# Patient Record
Sex: Female | Born: 1935 | Race: White | Hispanic: No | Smoking: Never smoker
Health system: Southern US, Community
[De-identification: ages and names within clinical notes are randomized; demographics above are authoritative.]

## PROBLEM LIST (undated history)

## (undated) DIAGNOSIS — Z950 Presence of cardiac pacemaker: Secondary | ICD-10-CM

## (undated) DIAGNOSIS — I10 Essential (primary) hypertension: Secondary | ICD-10-CM

## (undated) DIAGNOSIS — E079 Disorder of thyroid, unspecified: Secondary | ICD-10-CM

## (undated) DIAGNOSIS — I509 Heart failure, unspecified: Secondary | ICD-10-CM

## (undated) DIAGNOSIS — Z8673 Personal history of transient ischemic attack (TIA), and cerebral infarction without residual deficits: Secondary | ICD-10-CM

## (undated) DIAGNOSIS — K219 Gastro-esophageal reflux disease without esophagitis: Secondary | ICD-10-CM

## (undated) DIAGNOSIS — M810 Age-related osteoporosis without current pathological fracture: Secondary | ICD-10-CM

## (undated) DIAGNOSIS — F329 Major depressive disorder, single episode, unspecified: Secondary | ICD-10-CM

## (undated) DIAGNOSIS — R011 Cardiac murmur, unspecified: Secondary | ICD-10-CM

## (undated) DIAGNOSIS — F32A Depression, unspecified: Secondary | ICD-10-CM

## (undated) DIAGNOSIS — Z95810 Presence of automatic (implantable) cardiac defibrillator: Secondary | ICD-10-CM

## (undated) DIAGNOSIS — M1990 Unspecified osteoarthritis, unspecified site: Secondary | ICD-10-CM

## (undated) DIAGNOSIS — T7840XA Allergy, unspecified, initial encounter: Secondary | ICD-10-CM

## (undated) DIAGNOSIS — H269 Unspecified cataract: Secondary | ICD-10-CM

## (undated) DIAGNOSIS — I499 Cardiac arrhythmia, unspecified: Secondary | ICD-10-CM

## (undated) DIAGNOSIS — I219 Acute myocardial infarction, unspecified: Secondary | ICD-10-CM

## (undated) DIAGNOSIS — D649 Anemia, unspecified: Secondary | ICD-10-CM

## (undated) DIAGNOSIS — E039 Hypothyroidism, unspecified: Secondary | ICD-10-CM

## (undated) HISTORY — PX: INSERT / REPLACE / REMOVE PACEMAKER: SUR710

## (undated) HISTORY — DX: Anemia, unspecified: D64.9

## (undated) HISTORY — PX: APPENDECTOMY: SHX54

## (undated) HISTORY — DX: Major depressive disorder, single episode, unspecified: F32.9

## (undated) HISTORY — PX: ABDOMINAL HYSTERECTOMY: SHX81

## (undated) HISTORY — DX: Unspecified cataract: H26.9

## (undated) HISTORY — PX: OTHER SURGICAL HISTORY: SHX169

## (undated) HISTORY — DX: Allergy, unspecified, initial encounter: T78.40XA

## (undated) HISTORY — DX: Age-related osteoporosis without current pathological fracture: M81.0

## (undated) HISTORY — DX: Gastro-esophageal reflux disease without esophagitis: K21.9

## (undated) HISTORY — DX: Depression, unspecified: F32.A

## (undated) HISTORY — PX: HERNIA REPAIR: SHX51

## (undated) HISTORY — DX: Disorder of thyroid, unspecified: E07.9

## (undated) HISTORY — DX: Cardiac murmur, unspecified: R01.1

## (undated) HISTORY — DX: Unspecified osteoarthritis, unspecified site: M19.90

---

## 1999-03-17 ENCOUNTER — Encounter: Payer: Self-pay | Admitting: Neurosurgery

## 1999-03-17 ENCOUNTER — Ambulatory Visit (HOSPITAL_COMMUNITY): Admission: RE | Admit: 1999-03-17 | Discharge: 1999-03-17 | Payer: Self-pay | Admitting: Neurosurgery

## 2004-04-29 ENCOUNTER — Emergency Department: Payer: Self-pay | Admitting: General Practice

## 2013-07-05 ENCOUNTER — Emergency Department: Payer: Self-pay | Admitting: Emergency Medicine

## 2013-07-05 LAB — CBC
HCT: 36.5 % (ref 35.0–47.0)
HGB: 11.6 g/dL — AB (ref 12.0–16.0)
MCH: 29.6 pg (ref 26.0–34.0)
MCHC: 31.9 g/dL — ABNORMAL LOW (ref 32.0–36.0)
MCV: 93 fL (ref 80–100)
Platelet: 134 10*3/uL — ABNORMAL LOW (ref 150–440)
RBC: 3.93 10*6/uL (ref 3.80–5.20)
RDW: 14.1 % (ref 11.5–14.5)
WBC: 6.2 10*3/uL (ref 3.6–11.0)

## 2013-07-05 LAB — URINALYSIS, COMPLETE
Bacteria: NONE SEEN
Bilirubin,UR: NEGATIVE
GLUCOSE, UR: NEGATIVE mg/dL (ref 0–75)
Ketone: NEGATIVE
Nitrite: NEGATIVE
PH: 6 (ref 4.5–8.0)
Protein: NEGATIVE
RBC,UR: 12 /HPF (ref 0–5)
Specific Gravity: 1.01 (ref 1.003–1.030)
WBC UR: 10 /HPF (ref 0–5)

## 2013-07-05 LAB — BASIC METABOLIC PANEL
Anion Gap: 4 — ABNORMAL LOW (ref 7–16)
BUN: 22 mg/dL — ABNORMAL HIGH (ref 7–18)
CALCIUM: 9.3 mg/dL (ref 8.5–10.1)
CO2: 29 mmol/L (ref 21–32)
Chloride: 104 mmol/L (ref 98–107)
Creatinine: 1.11 mg/dL (ref 0.60–1.30)
EGFR (African American): 54 — ABNORMAL LOW
EGFR (Non-African Amer.): 46 — ABNORMAL LOW
GLUCOSE: 94 mg/dL (ref 65–99)
Osmolality: 277 (ref 275–301)
Potassium: 4.3 mmol/L (ref 3.5–5.1)
SODIUM: 137 mmol/L (ref 136–145)

## 2013-07-05 LAB — PROTIME-INR
INR: 1.8
PROTHROMBIN TIME: 20.7 s — AB (ref 11.5–14.7)

## 2013-09-28 ENCOUNTER — Emergency Department: Payer: Self-pay | Admitting: Emergency Medicine

## 2013-09-29 LAB — URINALYSIS, COMPLETE
BILIRUBIN, UR: NEGATIVE
Glucose,UR: NEGATIVE mg/dL (ref 0–75)
Ketone: NEGATIVE
Nitrite: NEGATIVE
Ph: 6 (ref 4.5–8.0)
Protein: NEGATIVE
RBC,UR: 1 /HPF (ref 0–5)
SPECIFIC GRAVITY: 1.011 (ref 1.003–1.030)
Squamous Epithelial: 1

## 2013-10-31 LAB — COMPREHENSIVE METABOLIC PANEL
Albumin: 3.5 g/dL (ref 3.4–5.0)
Alkaline Phosphatase: 63 U/L
Anion Gap: 8 (ref 7–16)
BUN: 28 mg/dL — ABNORMAL HIGH (ref 7–18)
Bilirubin,Total: 0.5 mg/dL (ref 0.2–1.0)
Calcium, Total: 9.4 mg/dL (ref 8.5–10.1)
Chloride: 103 mmol/L (ref 98–107)
Co2: 29 mmol/L (ref 21–32)
Creatinine: 1.47 mg/dL — ABNORMAL HIGH (ref 0.60–1.30)
EGFR (African American): 38 — ABNORMAL LOW
EGFR (Non-African Amer.): 33 — ABNORMAL LOW
Glucose: 107 mg/dL — ABNORMAL HIGH (ref 65–99)
Osmolality: 285 (ref 275–301)
Potassium: 4.5 mmol/L (ref 3.5–5.1)
SGOT(AST): 24 U/L (ref 15–37)
SGPT (ALT): 22 U/L (ref 12–78)
Sodium: 140 mmol/L (ref 136–145)
Total Protein: 7.7 g/dL (ref 6.4–8.2)

## 2013-11-01 ENCOUNTER — Observation Stay: Payer: Self-pay | Admitting: Internal Medicine

## 2013-11-01 LAB — URINALYSIS, COMPLETE
Bilirubin,UR: NEGATIVE
Blood: NEGATIVE
Glucose,UR: NEGATIVE mg/dL (ref 0–75)
Ketone: NEGATIVE
Leukocyte Esterase: NEGATIVE
Nitrite: NEGATIVE
Ph: 5 (ref 4.5–8.0)
Protein: NEGATIVE
RBC,UR: 1 /HPF (ref 0–5)
Specific Gravity: 1.012 (ref 1.003–1.030)
Squamous Epithelial: <1
WBC UR: 14 /HPF (ref 0–5)

## 2013-11-01 LAB — CBC WITH DIFFERENTIAL/PLATELET
Basophil #: 0.1 10*3/uL (ref 0.0–0.1)
Basophil %: 1.8 %
Eosinophil #: 0.2 10*3/uL (ref 0.0–0.7)
Eosinophil %: 3 %
HCT: 36.2 % (ref 35.0–47.0)
HGB: 12 g/dL (ref 12.0–16.0)
Lymphocyte #: 1 10*3/uL (ref 1.0–3.6)
Lymphocyte %: 16.6 %
MCH: 32.3 pg (ref 26.0–34.0)
MCHC: 33.1 g/dL (ref 32.0–36.0)
MCV: 98 fL (ref 80–100)
Monocyte #: 0.7 x10 3/mm (ref 0.2–0.9)
Monocyte %: 12.4 %
Neutrophil #: 3.9 10*3/uL (ref 1.4–6.5)
Neutrophil %: 66.2 %
Platelet: 151 10*3/uL (ref 150–440)
RBC: 3.72 10*6/uL — ABNORMAL LOW (ref 3.80–5.20)
RDW: 14 % (ref 11.5–14.5)
WBC: 5.9 10*3/uL (ref 3.6–11.0)

## 2013-11-01 LAB — PROTIME-INR
INR: 1.4
Prothrombin Time: 16.9 secs — ABNORMAL HIGH (ref 11.5–14.7)

## 2013-11-02 LAB — CBC WITH DIFFERENTIAL/PLATELET
Basophil #: 0.1 10*3/uL (ref 0.0–0.1)
Basophil %: 1.6 %
Eosinophil #: 0.2 10*3/uL (ref 0.0–0.7)
Eosinophil %: 3.6 %
HCT: 34.6 % — ABNORMAL LOW (ref 35.0–47.0)
HGB: 11.3 g/dL — ABNORMAL LOW (ref 12.0–16.0)
Lymphocyte #: 1 10*3/uL (ref 1.0–3.6)
Lymphocyte %: 20.9 %
MCH: 31.9 pg (ref 26.0–34.0)
MCHC: 32.6 g/dL (ref 32.0–36.0)
MCV: 98 fL (ref 80–100)
Monocyte #: 0.5 x10 3/mm (ref 0.2–0.9)
Monocyte %: 11 %
Neutrophil #: 3.1 10*3/uL (ref 1.4–6.5)
Neutrophil %: 62.9 %
Platelet: 128 10*3/uL — ABNORMAL LOW (ref 150–440)
RBC: 3.54 10*6/uL — ABNORMAL LOW (ref 3.80–5.20)
RDW: 13.6 % (ref 11.5–14.5)
WBC: 5 10*3/uL (ref 3.6–11.0)

## 2013-11-02 LAB — BASIC METABOLIC PANEL
Anion Gap: 6 — ABNORMAL LOW (ref 7–16)
BUN: 23 mg/dL — ABNORMAL HIGH (ref 7–18)
Calcium, Total: 8.9 mg/dL (ref 8.5–10.1)
Chloride: 108 mmol/L — ABNORMAL HIGH (ref 98–107)
Co2: 27 mmol/L (ref 21–32)
Creatinine: 1.27 mg/dL (ref 0.60–1.30)
EGFR (African American): 45 — ABNORMAL LOW
EGFR (Non-African Amer.): 39 — ABNORMAL LOW
Glucose: 92 mg/dL (ref 65–99)
Osmolality: 285 (ref 275–301)
Potassium: 4.6 mmol/L (ref 3.5–5.1)
Sodium: 141 mmol/L (ref 136–145)

## 2013-11-02 LAB — MAGNESIUM: Magnesium: 1.7 mg/dL — ABNORMAL LOW

## 2013-11-02 LAB — PROTIME-INR
INR: 1.4
Prothrombin Time: 17.2 secs — ABNORMAL HIGH (ref 11.5–14.7)

## 2013-11-02 LAB — LIPID PANEL
Cholesterol: 136 mg/dL (ref 0–200)
HDL Cholesterol: 34 mg/dL — ABNORMAL LOW (ref 40–60)
Ldl Cholesterol, Calc: 85 mg/dL (ref 0–100)
Triglycerides: 85 mg/dL (ref 0–200)
VLDL Cholesterol, Calc: 17 mg/dL (ref 5–40)

## 2013-11-06 DIAGNOSIS — I6782 Cerebral ischemia: Secondary | ICD-10-CM | POA: Insufficient documentation

## 2013-11-06 DIAGNOSIS — R0789 Other chest pain: Secondary | ICD-10-CM | POA: Insufficient documentation

## 2013-11-06 DIAGNOSIS — G939 Disorder of brain, unspecified: Secondary | ICD-10-CM | POA: Insufficient documentation

## 2013-11-06 DIAGNOSIS — R0781 Pleurodynia: Secondary | ICD-10-CM | POA: Insufficient documentation

## 2013-12-03 ENCOUNTER — Encounter: Payer: Self-pay | Admitting: Family Medicine

## 2014-01-17 DIAGNOSIS — G8929 Other chronic pain: Secondary | ICD-10-CM | POA: Insufficient documentation

## 2014-01-17 DIAGNOSIS — K760 Fatty (change of) liver, not elsewhere classified: Secondary | ICD-10-CM | POA: Insufficient documentation

## 2014-01-17 DIAGNOSIS — K5900 Constipation, unspecified: Secondary | ICD-10-CM | POA: Insufficient documentation

## 2014-01-17 DIAGNOSIS — R109 Unspecified abdominal pain: Secondary | ICD-10-CM

## 2014-01-23 ENCOUNTER — Ambulatory Visit: Payer: Self-pay | Admitting: Unknown Physician Specialty

## 2014-01-30 ENCOUNTER — Ambulatory Visit: Payer: Self-pay | Admitting: Urology

## 2014-04-23 ENCOUNTER — Ambulatory Visit: Payer: Self-pay | Admitting: Vascular Surgery

## 2014-04-23 LAB — BASIC METABOLIC PANEL
Anion Gap: 5 — ABNORMAL LOW (ref 7–16)
BUN: 22 mg/dL — AB (ref 7–18)
CHLORIDE: 109 mmol/L — AB (ref 98–107)
CO2: 26 mmol/L (ref 21–32)
Calcium, Total: 8.5 mg/dL (ref 8.5–10.1)
Creatinine: 1.02 mg/dL (ref 0.60–1.30)
GFR CALC NON AF AMER: 55 — AB
Glucose: 95 mg/dL (ref 65–99)
OSMOLALITY: 283 (ref 275–301)
Potassium: 4.8 mmol/L (ref 3.5–5.1)
Sodium: 140 mmol/L (ref 136–145)

## 2014-04-23 LAB — PROTIME-INR
INR: 1.9
Prothrombin Time: 21.7 secs — ABNORMAL HIGH (ref 11.5–14.7)

## 2014-04-25 DIAGNOSIS — I739 Peripheral vascular disease, unspecified: Secondary | ICD-10-CM | POA: Insufficient documentation

## 2014-04-25 DIAGNOSIS — I999 Unspecified disorder of circulatory system: Secondary | ICD-10-CM | POA: Insufficient documentation

## 2014-05-30 ENCOUNTER — Encounter: Payer: Self-pay | Admitting: Surgery

## 2014-06-13 ENCOUNTER — Encounter: Payer: Self-pay | Admitting: Surgery

## 2014-06-21 DIAGNOSIS — R350 Frequency of micturition: Secondary | ICD-10-CM | POA: Insufficient documentation

## 2014-06-21 DIAGNOSIS — Z87448 Personal history of other diseases of urinary system: Secondary | ICD-10-CM | POA: Insufficient documentation

## 2014-06-21 DIAGNOSIS — Z87718 Personal history of other specified (corrected) congenital malformations of genitourinary system: Secondary | ICD-10-CM | POA: Insufficient documentation

## 2014-06-24 DIAGNOSIS — R339 Retention of urine, unspecified: Secondary | ICD-10-CM | POA: Insufficient documentation

## 2014-06-24 DIAGNOSIS — N390 Urinary tract infection, site not specified: Secondary | ICD-10-CM | POA: Insufficient documentation

## 2014-06-25 ENCOUNTER — Encounter
Admit: 2014-06-25 | Disposition: A | Discharge status: Home / Self Care | Payer: Self-pay | Attending: Surgery | Admitting: Surgery

## 2014-07-26 ENCOUNTER — Encounter
Admit: 2014-07-26 | Disposition: A | Discharge status: Home / Self Care | Payer: Self-pay | Attending: Surgery | Admitting: Surgery

## 2014-08-17 NOTE — H&P (Signed)
PATIENT NAME:  Joanna, Roberts MR#:  [id number] DATE OF BIRTH:  [birth year]  DATE OF ADMISSION:  10/31/2013  REFERRING PHYSICIAN:  Dr. Kerman Passey.  PRIMARY CARE PHYSICIAN:  Dr. Netty Starring.   CHIEF COMPLAINT:  Episode of confusion and slurred speech.   HISTORY OF PRESENT ILLNESS:  This is an 81 year old female with known history of A. Fib on anticoagulation with warfarin, tachybrady syndrome, status post AICD placement, hypothyroidism and hypertension, the patient presents with complaint last evening for brief episode lasted around 30 minutes, reports she was trying to call someone where she could not use the phone, and felt she was confused, as well she could not talk as well.  This episode lasted for around an hour, by the time the patient had came to the ED her symptoms had been resolved, she denies any other focal deficits.  No tingling.  No numbness.  No urinary or stool incontinence.  No seizure activity was noticed by the family as well, as well the patient reports the night before she had another episode of confusion at night where she was found to have high blood pressure which was evaluated by EMS where the patient was back at her baseline by the time they were there as well, the patient had basic work-up in ED which was only significant for mild renal failure with a creatinine of 1.47 where her baseline is usually within normal limits, as well the patient's INR was 1.4, urinalysis is still pending, CT head did not show any acute findings, only showed chronic atrophy and small vessel ischemic changes, hospitalist service requested to admit the patient for further evaluation, she denies any fever, any chills, any dysuria, polyuria, any cough, any productive sputum, any chest pain, reports her AICD was interrogated by Dr. Ubaldo Glassing last month and she reports she did not have any abnormalities.   PAST MEDICAL HISTORY: 1.  Tachybrady syndrome.  2.  Hypothyroidism.  3.  Interstitial disease.  4.  Reflux  esophagitis.  5.  Recurrent UTI.  6.  Anemia.  7.  Diverticulosis with abdominal pain.  8.  Colonic polyps.  9.  Atrial fibrillation.  10.  Cardiomyopathy, status post AICD with pacemaker placement.  11.  Chronic back pain.  12.  Insomnia.   PAST SURGICAL HISTORY:  2.  Herniorrhaphy.  3.  Appendectomy. 4.  Right carotid endarterectomy.  5.  Right vein stripping.   HOME MEDICATIONS: 1.  Sublingual nitroglycerin as needed.  2.  Warfarin 2 mg daily and 3 mg on top of the 2 mg every Monday, Wednesday, Friday.  3.  Coreg 25 mg oral twice daily.  4.  Percocet as needed.  5.  Lisinopril 2.5 mg oral daily.  6.  Levothyroxine 25 mcg oral daily.  8.  Ativan 2 mg as needed.   ALLERGIES:  LORTAB.   SOCIAL HISTORY:  The patient lives with her daughter and son-in-law.  No tobacco.  No alcohol.  No illicit drug use.   FAMILY HISTORY:  Significant for coronary artery disease.   REVIEW OF SYSTEMS:  CONSTITUTIONAL:  Denies fever, chills, fatigue, weakness.  EYES:  Denies blurry vision, double vision, inflammation.  EARS, NOSE, THROAT:  Denies tinnitus, ear pain, hearing loss, epistaxis.  RESPIRATORY:  Denies cough, wheezing, hemoptysis.  CARDIOVASCULAR:  Denies chest pain, arrhythmia, palpitations, syncope.  GASTROINTESTINAL:  Denies nausea, vomiting, diarrhea, abdominal pain, rectal bleed, diarrhea.  Reports constipation.  GENITOURINARY:  Denies dysuria, hematuria, or renal colic.  Reports history of urinary retention where she  self-cath's.  ENDOCRINE:  Denies polyuria, polydipsia, heat or cold intolerance.  HEMATOLOGY:  Denies anemia, easy bruising, or bleeding diathesis.  INTEGUMENT:  Denies acne, rash or skin lesion.  MUSCULOSKELETAL:  Denies any gout, arthritis or cramps.  Reports chronic lower back pain.  NEUROLOGIC:  Denies any history of CVA, TIA, seizures in the past.  Reports an episode of expressive aphasia and confusion.  PSYCHIATRIC:  Denies any history of substance abuse.   Reports history of anxiety.  Denies insomnia or depression.   PHYSICAL EXAMINATION: VITAL SIGNS:  Temperature 98, pulse 73, respiratory rate 20, blood pressure 143/93, saturating 98% on room air.  GENERAL:  Well-nourished female who looks comfortable in bed, in no apparent distress.  HEENT:  Head atraumatic, normocephalic.  Pupils equal, reactive to light.  Pink conjunctivae.  Anicteric sclerae.  Moist oral mucosa.  NECK:  Supple.  No thyromegaly.  No JVD.  CHEST:  Good air entry bilaterally.  No wheezing, rales, rhonchi.  CARDIOVASCULAR:  S1, S2 heard.  No rubs, murmurs, gallops.  ABDOMEN:  Soft, nontender, nondistended.  Bowel sounds present.  EXTREMITIES:  Bilateral lower extremity edema +1 in the left, +1 to 2 on the right which family reports this is chronic.  Pulses felt bilaterally, pedal pulses +2 bilaterally.  PSYCHIATRIC:  Appropriate affect.  Awake, alert x 3.  Intact judgment and insight.  NEUROLOGIC:  Cranial nerves grossly intact.  Motor 5 out of 5.  No focal deficits.  Sensation symmetrical and intact to light touch.  MUSCULOSKELETAL:  No joint effusion or erythema.   PERTINENT LABORATORY DATA:  CT head without contrast showing no acute intracranial abnormalities, chronic atrophy and small vessel ischemic changes.  Glucose 107, BUN 28, creatinine 1.47, sodium 140, potassium 4.5, chloride 103, CO2 29, ALT 22, AST 24, alk phos 63.  White blood cell 5.9, hemoglobin 12, hematocrit 36.2, platelets 151, INR 1.4.   ASSESSMENT AND PLAN: 1.  Acute encephalopathy, brief episodes of altered mental status, there is a suspicion of transient ischemic attack, so the patient will be admitted to telemetry floor for further work-up, unfortunately, cannot obtain MRI/MRA of the brain given her automatic implantable cardiac defibrillator, so we will obtain carotid Dopplers, we will check 2-D echocardiogram.  We will keep her on telemetry and monitor her rhythm, the patient reports her recently  interrogated automatic implantable cardiac defibrillator was within normal limits, we will give her one dose of aspirin.  As well, we will check her urinalysis.  We will continue with gentle hydration for her.  2.  Constipation.  The patient will be started on good bowel regimen including senna, Colace and MiraLAX for a total of three doses. 3.  Acute renal failure, we will hold the patient's lisinopril.  We will continue her with gentle hydration, a total of 500 mL.  4.  Tachybrady syndrome.  The patient is status post pacemaker.  Will be monitored on telemetry.  5.  Hypothyroidism.  Continue with Synthroid.  6.  Atrial fibrillation, rate controlled.  Continue with Coreg and we will consult pharmacy to dose warfarin.  7.  Cardiomyopathy, status post automatic implantable cardiac defibrillator placement.  Does not appear to be in any congestive heart failure or volume overload status.  We will hydrate her gently.  8.  Deep vein thrombosis prophylaxis.  The patient is on warfarin.  9.  CODE STATUS:  REPORTS SHE IS A FULL CODE.  Her daughter is her healthcare power of attorney.   Total time spent on  admission and patient care 55 minutes.     ____________________________ Albertine Patricia, MD dse:ea D: 11/01/2013 01:48:23 ET T: 11/01/2013 02:52:30 ET JOB#: [id number]  cc: Albertine Patricia, MD, <Dictator> Khala Tarte Graciela Husbands MD ELECTRONICALLY SIGNED 11/02/2013 0:50

## 2014-08-17 NOTE — Discharge Summary (Signed)
PATIENT NAME:  Joanna Roberts, Joanna Roberts MR#:  [id number] DATE OF BIRTH:  04-15-1929  DATE OF ADMISSION:  11/01/2013 DATE OF DISCHARGE:  11/02/2013  DISCHARGE DIAGNOSES: 1.  Transient ischemic attack.  2.  Atrial fibrillation, on warfarin.  3.  Mild left ventricle dysfunction with an EF of 40% to 45%.   DISCHARGE MEDICATIONS: 1.  Nitroglycerin 0.4 mg sublingual q. 5 minutes as needed x3 for chest pain.  2.  Acetaminophen/oxycodone 325/5 one tab p.o. q. 4 hours as needed for severe back pain.  3.  Lisinopril 2.5 mg p.o. daily.  4.  Levothyroxine 25 mcg p.o. daily.  5.  Rapaflo 4 mg p.o. daily.  6.  Ativan 2 mg p.o. at bedtime as needed for anxiety.  7.  Warfarin 3 mg p.o. daily.   CONSULTANTS: None.   PROCEDURES: None.   PERTINENT LABORATORY AND STUDIES: The patient had an echo that did show an EF of 40% to 45%. Carotid studies were less than 50% stenosis bilateral.   On day of discharge, sodium 141, potassium 4.6, creatinine 1.27. LDL 85, HDL 34. White blood cell count 5, hemoglobin 11.3, platelets 120,000. INR 1.4.   BRIEF HOSPITAL COURSE:  1.  TIA. The patient initially came in with difficulty speaking and problems with recalling information concerning for possible TIA. CT of the head was negative upon admission. She was risk stratified and had an echocardiogram and carotid studies as stated above. She had no further symptoms. Evaluated by PT who did not think she needed any therapy. Focus on major cardiovascular risk factors at this time. Go home without any further therapy.  2.  A-fib, on Coumadin. She is currently subtherapeutic. We will increase Coumadin level to 3 mg daily. Will need to reassess the INR early next week at her appointment. She is currently rate controlled and asymptomatic.  3.  LV dysfunction. This is new. EF 40% to 45%. She is asymptomatic. No changes to her current regimen.   DISPOSITION: She is in stable condition and will be discharged to home and follow up with Dr.  Burnadette Pop as scheduled next Tuesday.  ____________________________ Marisue Ivan, MD kl:sb D: 11/02/2013 08:32:44 ET T: 11/02/2013 10:17:00 ET JOB#: [id number]  cc: Marisue Ivan, MD, <Dictator> Marisue Ivan MD ELECTRONICALLY SIGNED 11/18/2013 8:06

## 2014-08-21 NOTE — Op Note (Signed)
PATIENT NAME:  Joanna Roberts, Joanna Roberts MR#:  [id number] DATE OF BIRTH:  1929-03-05  DATE OF PROCEDURE:  04/23/2014  PREOPERATIVE DIAGNOSES:  1. Atherosclerotic occlusive disease, bilateral lower extremities, with ulceration of the right ankle.  2. Coronary artery disease.  3. Cardiac arrhythmia, status post pacemaker placement.  4. Hypertension.   POSTOPERATIVE DIAGNOSES:  1. Atherosclerotic occlusive disease, bilateral lower extremities, with ulceration of the right ankle.  2. Coronary artery disease.  3. Cardiac arrhythmia, status post pacemaker placement.  4. Hypertension.   PROCEDURES PERFORMED: 1. Abdominal aortogram.  2. Right lower extremity distal runoff, third order catheter placement.  3. Crosser atherectomy of the peroneal artery occlusion, right side.  4. Percutaneous transluminal angioplasty of the right peroneal artery to 4 mm using the Lutonix balloon.   SURGEON: Renford Dills, MD  SEDATION: Versed plus fentanyl.   MONITORING:  Continuous ECG, pulse oximetry, and cardiopulmonary monitoring is performed throughout the entire procedure by the interventional radiology nurse.   TOTAL SEDATION TIME: 1 hour 30 minutes.   ACCESS: A 6 French sheath, left common femoral artery.   FLUOROSCOPY TIME: 9.8 minutes.   CONTRAST USED: Isovue 88 mL.   INDICATIONS: Ms. Simm is an 81 year old woman who presents with ulceration and ischemic changes of her right foot. She also has significant atherosclerotic occlusive disease of the left side. The risks and benefits for angiography with the hope for intervention for limb salvage on the right were reviewed. All questions have been answered. The patient agrees to proceed with intervention.   DESCRIPTION OF PROCEDURE: The patient is taken to special procedures,  placed in the supine position. After adequate sedation is achieved, both groins are prepped and draped in sterile fashion. Appropriate timeout is called.   Lidocaine 1% is infiltrated  in the soft tissues. On the left, overlying the femoral impulse, an access to the common femoral artery is obtained with ultrasound guidance. Ultrasound probe was placed in a sterile sleeve. Common femoral artery is identified. It is echolucent and pulsatile, indicating patency. Image is recorded for the permanent record. Under real-time visualization, a micropuncture needle is inserted, microwire followed by micro sheath, J-wire followed by a 5 French sheath and 5 French pigtail catheter.   The pigtail catheter is positioned at the level of T12 and AP projection of the aorta is obtained. Pigtail catheter is repositioned to above the bifurcation and an LAO projection of the pelvis is obtained. Rim catheter is then used to cross the aortic bifurcation with the stiff-angled Glidewire, and the wire is advanced down into the proximal SFA. Rim catheter is exchanged for a pigtail catheter and both LAO and RAO views of the common femoral artery are obtained and then subsequently, the detector is positioned AP and distal runoff is obtained. After review of these images, 4000 units of heparin is given. A stiff-angled Glidewire is reintroduced and the 5 [place] sheath is exchanged for a 6 [place] Raby. The 18 wire is then negotiated down to the tibioperoneal trunk, followed by an angled Usher catheter. The Crosser S6 device is prepped on the field and advanced through the Usher catheter. The S6 is then used to cross the occluded segment of the tibial. The Usher is advanced and subsequently, hand injection demonstrates  intraluminal placement with patency of the peroneal, all the way down to the foot with extensive collaterals to the pedal branches. The  wire is reintroduced and initially, a 4 x 6 Lutonix balloon is advanced across the lesion. It is inflated  to 3-4 atmospheres for a minute and a half. This should provide for dispersal of the drug, and then a 3 x 6 Ultraverse balloon is advanced over the wire, across the  lesion, and inflated to 12 atmospheres for 2 minutes. Follow-up imaging demonstrates an excellent result with wide patency of the peroneal artery in its origin. Further review of the images demonstrate the anterior tibial is occluded approximately 1 cm after its origin and remains occluded throughout its course. There is no reconstitution of the dorsalis pedis, specifically. Also, the posterior tibial is nonvisualized throughout its entirety. The lateral plantar also is poorly visualized.   Imaging of both the popliteal in magnified view as well as of the SFA more proximally are performed. These lesions appear to be less than 30% and therefore, should not be hemodynamically significant and do not require treatment at this time.   The sheath is then pulled into the external iliac on the left. Oblique view is obtained and a StarClose device deployed successfully, and there are no immediate complications.   INTERPRETATION: The abdominal aorta, bilateral common and external iliac arteries are patent. There is diffuse disease, but there are no hemodynamically significant stenoses.   The common femoral artery on the right is patent, as is the profunda femoris. SFA is patent. There is diffuse disease, but as noted above, there are no focal hemodynamically significant lesions identified. This is true of the popliteal. The trifurcation, however, ia different story with occlusion of the anterior tibial and posterior tibial throughout the vast majority of their courses and no distal reconstitution to provide a target. The peroneal is patent in its distal two thirds with extensive collaterals filling the foot; however, in its proximal one third over a distance of approximately 4 cm, there is diffuse disease with several with a significant occlusion.   Following Crosser atherectomy, intraluminal positioning is verified. Following angioplasty, there is now patency with in-line flow from the aorta to the foot via the  peroneal.   SUMMARY: Successful salvage of the  right lower extremity with recanalization of the peroneal to provide proper flow for wound healing.    ____________________________ Renford Dills, MD ggs:mw D: 04/23/2014 16:51:41 ET T: 04/23/2014 19:45:20 ET JOB#: [id number]  cc: Renford Dills, MD, <Dictator> Marisue Ivan, MD Renford Dills MD ELECTRONICALLY SIGNED 05/07/2014 17:32

## 2014-08-26 ENCOUNTER — Ambulatory Visit: Payer: Self-pay | Admitting: Surgery

## 2014-09-02 ENCOUNTER — Encounter: Payer: PPO | Attending: Surgery | Admitting: Surgery

## 2014-09-02 DIAGNOSIS — I70201 Unspecified atherosclerosis of native arteries of extremities, right leg: Secondary | ICD-10-CM | POA: Diagnosis not present

## 2014-09-02 DIAGNOSIS — I83013 Varicose veins of right lower extremity with ulcer of ankle: Secondary | ICD-10-CM | POA: Diagnosis not present

## 2014-09-02 DIAGNOSIS — L97312 Non-pressure chronic ulcer of right ankle with fat layer exposed: Secondary | ICD-10-CM | POA: Insufficient documentation
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
## 2014-09-03 NOTE — Progress Notes (Signed)
Joanna Roberts Joanna Roberts Roberts, Joanna Roberts Joanna Roberts Roberts ([id number]) Visit Report for 09/02/2014 Chief Complaint Document Details Patient Name: Joanna Roberts, Joanna Roberts Roberts. Date of Service: 09/02/2014 2:30 PM Medical Record Number: [id number] Patient Account Number: [id number] Date of Birth/Sex: 1928-09-24 (81 y.o. Female) Treating RN: Renee Harder Primary Care Physician: Marisue Ivan Other Clinician: Referring Physician: Levora Dredge Treating Physician/Extender: Rudene Re in Treatment: 13 Information Obtained from: Patient Chief Complaint right lateral malleolus wound. She says she has seen a vascular surgeon and recently about 2 weeks ago. I have reviewed the reports from Dr. Gilda Crease. was unable to fill a prescription for Santyl because of the cost and they have only been using Desitin and Prisma over the wound. 08/12/2014 -- the patient has not seen [place] for about 14 days now and she says that recently she noticed that her left leg was having a little bit of pain and there was a redness over the lateral malleoli on the left side too. The daughter has her own ailments and has not been able to do dressings and the home health nurses only come twice a week so they have not been doing dressings more often. Electronic Signature(s) Signed: 09/02/2014 5:02:53 PM By: Evlyn Kanner MD, FACS Entered By: Evlyn Kanner on 09/02/2014 14:54:13 Joanna Roberts Joanna Roberts Roberts, Joanna Roberts Joanna Roberts Roberts ([id number]) -------------------------------------------------------------------------------- Debridement Details Patient Name: Joanna Roberts Joanna Roberts Roberts. Date of Service: 09/02/2014 2:30 PM Medical Record Number: [id number] Patient Account Number: [id number] Date of Birth/Sex: Mar 13, 1929 (81 y.o. Female) Treating RN: Renee Harder Primary Care Physician: Marisue Ivan Other Clinician: Referring Physician: Levora Dredge Treating Physician/Extender: Rudene Re in Treatment: 13 Debridement Performed for Wound #1 Right,Lateral Malleolus Assessment: Performed By: Physician  Joanna Roberts Joanna Roberts Roberts., MD Debridement: Open Wound/Selective Debridement Selective Description: Pre-procedure Yes Verification/Time Out Taken: Start Time: 14:46 Pain Control: Lidocaine 4% Topical Solution Level: Non-Viable Tissue Total Area Debrided (L x 0.5 (cm) x 0.3 (cm) = 0.15 (cm) W): Tissue and other Non-Viable, Exudate, Fibrin/Slough material debrided: Instrument: Forceps Bleeding: None End Time: 14:49 Procedural Pain: 0 Post Procedural Pain: 0 Response to Treatment: Procedure was tolerated well Post Debridement Measurements of Total Wound Length: (cm) 0.5 Width: (cm) 0.3 Depth: (cm) 0.2 Volume: (cm) 0.024 Electronic Signature(s) Signed: 09/02/2014 4:40:25 PM By: Renee Harder RN Signed: 09/02/2014 5:02:53 PM By: Evlyn Kanner MD, FACS Entered By: Evlyn Kanner on 09/02/2014 14:53:53 Schlottman, Joanna Roberts Joanna Roberts Roberts ([id number]) -------------------------------------------------------------------------------- HPI Details Patient Name: Joanna Roberts Joanna Roberts Roberts. Date of Service: 09/02/2014 2:30 PM Medical Record Number: [id number] Patient Account Number: [id number] Date of Birth/Sex: 1928/10/07 (81 y.o. Female) Treating RN: Renee Harder Primary Care Physician: Marisue Ivan Other Clinician: Referring Physician: Levora Dredge Treating Physician/Extender: Rudene Re in Treatment: 13 History of Present Illness Location: the patient has a wound on the right lateral malleolus. Duration: she states she has had the wound for a few months now. Context: the patient is in place and a dry dressing on the wound. Modifying Factors: she is already had a vascular evaluation and the vascular surgeon stated that there was enough inflow velocity healed wound. Associated Signs and Symptoms: denies any fevers. HPI Description: the patient is an 81 year old female who was not a diabetic who presents with a right lateral malleolar wound. The patient returns for followup today. She denies fevers. she says  she has had several procedures on her right leg in the past but she does not know if the arterial or venous. She has known that the left leg also had some surgery for some blockages. 06/20/14 -- the reports from the vascular surgeon Dr. Gilda Crease  were that her arterial pressure was enough to heal the wound and he would recommend Jobst ulcer compressions stockings to be worn form morning to night. with the patient has not been compliant with this because she finds it impossible to put them on. The daughter does say that in the coming week she will go down and try and help her with this. 06/27/14 she has been using the idosorb dressings and the daughter has been helping to do it on alternate days. other than that there are no fresh issues. 06/27/14 she's been doing fine and has no present problems. She was seen in the vascular surgeon's office recently and they have not made any changes to her treatment. 07/01/14 -- the patient is here today because she was concerned that the pain in the lateral part of the ankle is significant and that her legs are swollen. However on questioning her she has not been using her compression stockings at all. We had got a juxta light stockings to help her these on easily. however the family members and she have not been able to get this done on a regular basis. 07/08/2014 -- the patient has now begun wearing husband juxta light stockings on a regular basis though she is not wearing the inside stocking. She continues to have mild pain in that area and other than that has no discharge or fever. 07/15/2014 -- they have now been very compliant with the dressing and application of the juxta light stocking on a regular basis. no fresh issues. 07/22/2014 -- she had a fall the other day in the parking lot and hurt both elbows which were just minor abrasions and she has a dressing in place. She did not want [place] to look at those wounds and she says they're minor abrasions. The  dressing was done by the family members and she did not go to the ER nor did she have any x-rays. She says she is getting better from this. Joanna Roberts Joanna Roberts Roberts, Joanna Roberts Joanna Roberts Roberts ([id number]) 07/29/2014 the daughter was concerned regarding some redness around the wound and came a day early. No fever or any other symptoms. Electronic Signature(s) Signed: 09/02/2014 5:02:53 PM By: Evlyn Kanner MD, FACS Entered By: Evlyn Kanner on 09/02/2014 14:54:21 Joanna Roberts Joanna Roberts Roberts, Joanna Roberts Joanna Roberts Roberts ([id number]) -------------------------------------------------------------------------------- Physical Exam Details Patient Name: Joanna Roberts Joanna Roberts Roberts. Date of Service: 09/02/2014 2:30 PM Medical Record Number: [id number] Patient Account Number: [id number] Date of Birth/Sex: 1928-07-14 (81 y.o. Female) Treating RN: Renee Harder Primary Care Physician: Marisue Ivan Other Clinician: Referring Physician: Levora Dredge Treating Physician/Extender: Rudene Re in Treatment: 13 Constitutional . Pulse regular. Respirations normal and unlabored. Afebrile. . Eyes Nonicteric. Reactive to light. Ears, Nose, Mouth, and Throat Lips, teeth, and gums WNL.Marland Kitchen Moist mucosa without lesions . Neck supple and nontender. No palpable supraclavicular or cervical adenopathy. Normal sized without goiter. Respiratory WNL. No retractions.. Cardiovascular Pedal Pulses WNL. No clubbing, cyanosis or edema. Integumentary (Hair, Skin) the ulcerated area on her right lateral ankle area is persistent and needs some debridement of slough.. No crepitus or fluctuance. No peri-wound warmth or erythema. No masses.Marland Kitchen Psychiatric Judgement and insight Intact.. No evidence of depression, anxiety, or agitation.. Electronic Signature(s) Signed: 09/02/2014 5:02:53 PM By: Evlyn Kanner MD, FACS Entered By: Evlyn Kanner on 09/02/2014 14:55:23 Joanna Roberts Joanna Roberts Roberts, Joanna Roberts Joanna Roberts Roberts ([id number]) -------------------------------------------------------------------------------- Physician Orders Details Patient  Name: Joanna Roberts Joanna Roberts Roberts. Date of Service: 09/02/2014 2:30 PM Medical Record Number: [id number] Patient Account Number: [id number] Date of Birth/Sex: [birth year] (81 y.o. Female) Treating RN: Curtis Sites Primary Care Physician: Burnadette Pop,  Trisha Mangle Other Clinician: Referring Physician: Levora Dredge Treating Physician/Extender: Rudene Re in Treatment: 24 Verbal / Phone Orders: Yes Clinician: Curtis Sites Read Back and Verified: Yes Diagnosis Coding Wound Cleansing Wound #1 Right,Lateral Malleolus o Clean wound with Normal Saline. o Cleanse wound with mild soap and water Anesthetic Wound #1 Right,Lateral Malleolus o Topical Lidocaine 4% cream applied to wound bed prior to debridement Skin Barriers/Peri-Wound Care Wound #1 Right,Lateral Malleolus o Barrier cream Primary Wound Dressing Wound #1 Right,Lateral Malleolus o Santyl Ointment Secondary Dressing Wound #1 Right,Lateral Malleolus o Boardered Foam Dressing Dressing Change Frequency Wound #1 Right,Lateral Malleolus o Change dressing every day. Follow-up Appointments Wound #1 Right,Lateral Malleolus o Return Appointment in 1 week. Edema Control Wound #1 Right,Lateral Malleolus o Other: - juxtalite Additional Orders / Instructions Frisby, Soleil S. ([id number]) Wound #1 Right,Lateral Malleolus o Increase protein intake. o Activity as tolerated o Other: - juxtalite ordered Home Health Wound #1 Right,Lateral Malleolus o D/C Home Health Services - per patient request Electronic Signature(s) Signed: 09/02/2014 5:02:53 PM By: Evlyn Kanner MD, FACS Signed: 09/02/2014 5:38:27 PM By: Curtis Sites Entered By: Curtis Sites on 09/02/2014 14:53:10 Rosendahl, Stefan Kathie Joanna Roberts Roberts ([id number]) -------------------------------------------------------------------------------- Problem List Details Patient Name: Joanna Roberts Joanna Roberts Roberts, STAVIG. Date of Service: 09/02/2014 2:30 PM Medical Record Number: [id number] Patient Account Number:  [id number] Date of Birth/Sex: 1928/12/06 (81 y.o. 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 Date of Service: 09/02/2014 2:30 PM Medical Record Number: [id number] Patient Account Number: [id number] Date of Birth/Sex: [birth year] (81 y.o. Female) Treating RN: Renee Harder Primary Care Physician: Marisue Ivan Other Clinician: Referring Physician: Levora Dredge Treating Physician/Extender: Rudene Re in Treatment: 13 Subjective Chief Complaint Information obtained from Patient right lateral malleolus wound. She says she has seen a vascular surgeon and recently about 2 weeks ago. I have reviewed the reports from Dr. Gilda Crease. was unable to fill a prescription for Santyl because of the cost and they have only been using Desitin and Prisma over the wound. 08/12/2014 -- the patient has not seen [place] for about 14 days now and she says that recently she noticed that her left leg was having a little bit of pain and there was a redness over the lateral malleoli on the left side too. The daughter has her own ailments and has not been able to do dressings and the home health  nurses only come twice a week so they have not been doing dressings more often. History of Present Illness (HPI) The following HPI elements were documented for the patient's wound: Location: the patient has a wound on the right lateral malleolus. Duration: she states she has had the wound for a few months now. Context: the patient is in place and a dry dressing on the wound. Modifying Factors: she is already had a vascular evaluation and the vascular surgeon stated that there was enough inflow velocity healed wound. Associated Signs and Symptoms: denies any fevers. the patient is  an 81 year old female who was not a diabetic who presents with a right lateral malleolar wound. The patient returns for followup today. She denies fevers. she says she has had several procedures on her right leg in the past but she does not know if the arterial or venous. She has known that the left leg also had some surgery for some blockages. 06/20/14 -- the reports from the vascular surgeon Dr. Gilda Crease were that her arterial pressure was enough to heal the wound and he would recommend Jobst ulcer compressions stockings to be worn form morning to night. with the patient has not been compliant with this because she finds it impossible to put them on. The daughter does say that in the coming week she will go down and try and help her with this. 06/27/14 she has been using the idosorb dressings and the daughter has been helping to do it on alternate days. other than that there are no fresh issues. 06/27/14 she's been doing fine and has no present problems. She was seen in the vascular surgeon's office recently and they have not made any changes to her treatment. 07/01/14 -- the patient is here today because she was concerned that the pain in the lateral part of the ankle is significant and that her legs are swollen. However on questioning her she has not been using her KASHLEE, NEVILS. ([id number]) compression stockings at  all. We had got a juxta light stockings to help her these on easily. however the family members and she have not been able to get this done on a regular basis. 07/08/2014 -- the patient has now begun wearing husband juxta light stockings on a regular basis though she is not wearing the inside stocking. She continues to have mild pain in that area and other than that has no discharge or fever. 07/15/2014 -- they have now been very compliant with the dressing and application of the juxta light stocking on a regular basis. no fresh issues. 07/22/2014 -- she had a fall the other day in the parking lot and hurt both elbows which were just minor abrasions and she has a dressing in place. She did not want [place] to look at those wounds and she says they're minor abrasions. The dressing was done by the family members and she did not go to the ER nor did she have any x-rays. She says she is getting better from this. 07/29/2014 the daughter was concerned regarding some redness around the wound and came a day early. No fever or any other symptoms. Objective Constitutional Pulse regular. Respirations normal and unlabored. Afebrile. Vitals Time Taken: 2:32 PM, Height: 60 in, Weight: 141.5 lbs, BMI: 27.6, Temperature: 98.3 F, Pulse: 86 bpm, Respiratory Rate: 20 breaths/min, Blood Pressure: 129/81 mmHg. Eyes Nonicteric. Reactive to light. Ears, Nose, Mouth, and Throat Lips, teeth, and gums WNL.Marland Kitchen Moist mucosa without lesions . Neck supple and nontender. No palpable supraclavicular or cervical adenopathy. Normal sized without goiter. Respiratory WNL. No retractions.. Cardiovascular Pedal Pulses WNL. No clubbing, cyanosis or edema. Psychiatric Judgement and insight Intact.. No evidence of depression, anxiety, or agitation.Marland Kitchen Joanna Roberts, Joanna Roberts Roberts. ([id number]) Integumentary (Hair, Skin) the ulcerated area on her right lateral ankle area is persistent and needs some debridement of slough.. No crepitus or  fluctuance. No peri-wound warmth or erythema. No masses.. Wound #1 status is Open. Original cause of wound was Gradually Appeared. The wound is located on the Right,Lateral Malleolus. The wound measures 0.5cm length x 0.3cm width x 0.2cm depth; 0.118cm^2  area and 0.024cm^3 volume. The wound is limited to skin breakdown. There is no tunneling or undermining noted. There is a small amount of serous drainage noted. The wound margin is flat and intact. There is no granulation within the wound bed. There is a large (67-100%) amount of necrotic tissue within the wound bed including Adherent Slough. The periwound skin appearance exhibited: Localized Edema, Maceration, Moist, Hemosiderin Staining, Erythema. The periwound skin appearance did not exhibit: Callus, Crepitus, Excoriation, Fluctuance, Friable, Induration, Rash, Scarring, Dry/Scaly, Atrophie Blanche, Cyanosis, Ecchymosis, Mottled, Pallor, Rubor. The surrounding wound skin color is noted with erythema which is circumferential. Periwound temperature was noted as No Abnormality. The periwound has tenderness on palpation. 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 We will continue with therapy for her wound as described and she does understand that resolution of this problem is going to be extremely slow. We are using Santyl on the wound and she uses compression stockings with juxta lites. She will come and see me next week. Procedures Wound #1 Wound #1 is an Arterial Insufficiency Ulcer located on the Right,Lateral Malleolus . There was a Non-Viable Tissue Open Wound/Selective  [phone]) debridement with total area of 0.15 sq cm performed by Joanna Roberts Joanna Roberts Roberts ([id number]) Charlayne Vultaggio, Ignacia Felling., MD. with the following instrument(s): Forceps to remove Non-Viable tissue/material including Exudate and Fibrin/Slough after achieving pain control using Lidocaine 4% Topical Solution. A time out was conducted prior to the start of the procedure. There was no bleeding. The procedure was tolerated well with a pain level of 0 throughout and a pain level of 0 following the procedure. Post Debridement Measurements: 0.5cm length x 0.3cm width x 0.2cm depth; 0.024cm^3 volume. Plan Wound Cleansing: Wound #1 Right,Lateral Malleolus: Clean wound with Normal Saline. Cleanse wound with mild soap and water Anesthetic: Wound #1 Right,Lateral Malleolus: Topical Lidocaine 4% cream applied to wound bed prior to debridement Skin Barriers/Peri-Wound Care: Wound #1 Right,Lateral Malleolus: Barrier cream Primary Wound Dressing: Wound #1 Right,Lateral Malleolus: Santyl Ointment Secondary Dressing: Wound #1 Right,Lateral Malleolus: Boardered Foam Dressing Dressing Change Frequency: Wound #1 Right,Lateral Malleolus: Change dressing every day. Follow-up Appointments: Wound #1 Right,Lateral Malleolus: Return Appointment in 1 week. Edema Control: Wound #1 Right,Lateral Malleolus: Other: - juxtalite Additional Orders / Instructions: Wound #1 Right,Lateral Malleolus: Increase protein intake. Activity as tolerated Other: - juxtalite ordered Home Health: Wound #1 Right,Lateral Malleolus: D/C Home Health Services - per patient request Joanna Roberts Joanna Roberts Roberts, Joanna Roberts Joanna Roberts Roberts ([id number]) Her vascular surgeon has said that there is no intervention required and we are just to continue with application of juxta light stockings. We will continue with therapy for her wound as described and she does understand that resolution of this problem is going to be extremely slow. We are using Santyl on the wound and she uses  compression stockings with juxta lites. She will come and see me next week. Electronic Signature(s) Signed: 09/02/2014 5:02:53 PM By: Evlyn Kanner MD, FACS Entered By: Evlyn Kanner on 09/02/2014 14:57:22 Joanna Roberts Joanna Roberts Roberts, Joanna Roberts Joanna Roberts Roberts ([id number]) -------------------------------------------------------------------------------- SuperBill Details Patient Name: Joanna Roberts Joanna Roberts Roberts. Date of Service: 09/02/2014 Medical Record Number: [id number] Patient Account Number: [id number] Date of Birth/Sex: 1928-07-17 (81 y.o. 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 I70.201 Unspecified atherosclerosis of native arteries of extremities, right leg Facility Procedures CPT4 Code Description: [id number] - DEBRIDE WOUND 1ST 20 SQ CM OR < ICD-10 Description Diagnosis L97.312 Non-pressure chronic ulcer of right ankle with fat la I83.013 Varicose veins of right lower extremity with ulcer of I70.201 Unspecified  atherosclerosis of native arteries of ext Modifier: yer exposed ankle remities, righ Quantity: 1 t leg Physician Procedures CPT4 Code Description: [id number] - WC PHYS DEBR WO ANESTH 20 SQ CM ICD-10 Description Diagnosis L97.312 Non-pressure chronic ulcer of right ankle with fat lay I83.013 Varicose veins of right lower extremity with ulcer of I70.201 Unspecified  atherosclerosis of native arteries of extr Modifier: er exposed ankle emities, righ Quantity: 1 t leg Electronic Signature(s) Signed: 09/02/2014 5:02:53 PM By: Evlyn Kanner MD, FACS Entered By: Evlyn Kanner on 09/02/2014 14:57:42

## 2014-09-09 ENCOUNTER — Encounter: Payer: PPO | Admitting: Surgery

## 2014-09-09 DIAGNOSIS — L97312 Non-pressure chronic ulcer of right ankle with fat layer exposed: Secondary | ICD-10-CM | POA: Diagnosis not present

## 2014-09-10 NOTE — Progress Notes (Signed)
KIMBREE, CASANAS ([id number]) Visit Report for 09/09/2014 Chief Complaint Document Details Patient Name: Joanna Roberts, Joanna Roberts. Date of Service: 09/09/2014 1:45 PM Medical Record Number: [id number] Patient Account Number: [id number] Date of Birth/Sex: 03/18/1929 (81 y.o. Female) Treating RN: Huel Coventry Primary Care Physician: Marisue Ivan Other Clinician: Referring Physician: Marisue Ivan Treating Physician/Extender: Rudene Re in Treatment: 14 Information Obtained from: Patient Chief Complaint right lateral malleolus wound. She says she has seen a vascular surgeon and recently about 2 weeks ago. I have reviewed the reports from Dr. Gilda Crease. was unable to fill a prescription for Santyl because of the cost and they have only been using Desitin and Prisma over the wound. 08/12/2014 -- the patient has not seen [place] for about 14 days now and she says that recently she noticed that her left leg was having a little bit of pain and there was a redness over the lateral malleoli on the left side too. The daughter has her own ailments and has not been able to do dressings and the home health nurses only come twice a week so they have not been doing dressings more often. Electronic Signature(s) Signed: 09/09/2014 1:32:08 PM By: Evlyn Kanner MD, FACS Entered By: Evlyn Kanner on 09/09/2014 14:17:18 Joanna Roberts, Joanna Roberts ([id number]) -------------------------------------------------------------------------------- Debridement Details Patient Name: Joanna Roberts. Date of Service: 09/09/2014 1:45 PM Medical Record Number: [id number] Patient Account Number: [id number] Date of Birth/Sex: December 06, 1928 (81 y.o. Female) Treating RN: Huel Coventry Primary Care Physician: Marisue Ivan Other Clinician: Referring Physician: Marisue Ivan Treating Physician/Extender: Rudene Re in Treatment: 14 Debridement Performed for Wound #1 Right,Lateral Malleolus Assessment: Performed By: Physician  Tristan Schroeder., MD Debridement: Open Wound/Selective Debridement Selective Description: Pre-procedure Yes Verification/Time Out Taken: Start Time: 14:09 Pain Control: Lidocaine 4% Topical Solution Level: Non-Viable Tissue Total Area Debrided (L x 0.2 (cm) x 0.3 (cm) = 0.06 (cm) W): Tissue and other Non-Viable, Eschar, Fibrin/Slough material debrided: Instrument: Forceps Bleeding: None End Time: 14:13 Procedural Pain: 0 Post Procedural Pain: 0 Response to Treatment: Procedure was tolerated well Post Debridement Measurements of Total Wound Length: (cm) 0.2 Width: (cm) 0.3 Depth: (cm) 0.2 Volume: (cm) 0.009 Electronic Signature(s) Signed: 09/09/2014 1:32:08 PM By: Evlyn Kanner MD, FACS Signed: 09/09/2014 4:47:02 PM By: Elliot Gurney RN, BSN, Kim RN, BSN Entered By: Evlyn Kanner on 09/09/2014 14:17:05 Hockley, Alexsus Kathie Rhodes ([id number]) -------------------------------------------------------------------------------- HPI Details Patient Name: Joanna Roberts. Date of Service: 09/09/2014 1:45 PM Medical Record Number: [id number] Patient Account Number: [id number] Date of Birth/Sex: 1928/07/28 (81 y.o. Female) Treating RN: Huel Coventry Primary Care Physician: Marisue Ivan Other Clinician: Referring Physician: Marisue Ivan Treating Physician/Extender: Rudene Re in Treatment: 14 History of Present Illness Location: the patient has a wound on the right lateral malleolus. Duration: she states she has had the wound for a few months now. Context: the patient is in place and a dry dressing on the wound. Modifying Factors: she is already had a vascular evaluation and the vascular surgeon stated that there was enough inflow velocity healed wound. Associated Signs and Symptoms: denies any fevers. HPI Description: the patient is an 81 year old female who was not a diabetic who presents with a right lateral malleolar wound. The patient returns for followup today. She denies  fevers. she says she has had several procedures on her right leg in the past but she does not know if the arterial or venous. She has known that the left leg also had some surgery for some blockages. 06/20/14 -- the reports from the vascular  surgeon Dr. Gilda Crease were that her arterial pressure was enough to heal the wound and he would recommend Jobst ulcer compressions stockings to be worn form morning to night. with the patient has not been compliant with this because she finds it impossible to put them on. The daughter does say that in the coming week she will go down and try and help her with this. 06/27/14 she has been using the idosorb dressings and the daughter has been helping to do it on alternate days. other than that there are no fresh issues. 06/27/14 she's been doing fine and has no present problems. She was seen in the vascular surgeon's office recently and they have not made any changes to her treatment. 07/01/14 -- the patient is here today because she was concerned that the pain in the lateral part of the ankle is significant and that her legs are swollen. However on questioning her she has not been using her compression stockings at all. We had got a juxta light stockings to help her these on easily. however the family members and she have not been able to get this done on a regular basis. 07/08/2014 -- the patient has now begun wearing husband juxta light stockings on a regular basis though she is not wearing the inside stocking. She continues to have mild pain in that area and other than that has no discharge or fever. 07/15/2014 -- they have now been very compliant with the dressing and application of the juxta light stocking on a regular basis. no fresh issues. 07/22/2014 -- she had a fall the other day in the parking lot and hurt both elbows which were just minor abrasions and she has a dressing in place. She did not want [place] to look at those wounds and she says they're minor  abrasions. The dressing was done by the family members and she did not go to the ER nor did she have any x-rays. She says she is getting better from this. Joanna Roberts, Joanna Roberts ([id number]) 07/29/2014 the daughter was concerned regarding some redness around the wound and came a day early. No fever or any other symptoms. Electronic Signature(s) Signed: 09/09/2014 1:32:08 PM By: Evlyn Kanner MD, FACS Entered By: Evlyn Kanner on 09/09/2014 14:17:28 Joanna Roberts, Joanna Roberts ([id number]) -------------------------------------------------------------------------------- Physical Exam Details Patient Name: Joanna Roberts. Date of Service: 09/09/2014 1:45 PM Medical Record Number: [id number] Patient Account Number: [id number] Date of Birth/Sex: [birth year] (81 y.o. Female) Treating RN: Huel Coventry Primary Care Physician: Marisue Ivan Other Clinician: Referring Physician: Marisue Ivan Treating Physician/Extender: Rudene Re in Treatment: 14 Constitutional . Pulse regular. Respirations normal and unlabored. Afebrile. . Eyes Nonicteric. Reactive to light. Ears, Nose, Mouth, and Throat Lips, teeth, and gums WNL.Marland Kitchen Moist mucosa without lesions . Neck supple and nontender. No palpable supraclavicular or cervical adenopathy. Normal sized without goiter. Respiratory WNL. No retractions.. Cardiovascular Pedal Pulses WNL. No clubbing, cyanosis or edema. Integumentary (Hair, Skin) there is a plug of fibrin in the small wound and other than that it looks fairly good. A small area of excoriation just superior anterior to this is not ulcerated.. No crepitus or fluctuance. No peri-wound warmth or erythema. No masses.Marland Kitchen Psychiatric Judgement and insight Intact.. No evidence of depression, anxiety, or agitation.. Electronic Signature(s) Signed: 09/09/2014 1:32:08 PM By: Evlyn Kanner MD, FACS Entered By: Evlyn Kanner on 09/09/2014 14:18:24 Joanna Roberts, Joanna Roberts  ([id number]) -------------------------------------------------------------------------------- Physician Orders Details Patient Name: Joanna Roberts. Date of Service: 09/09/2014 1:45 PM Medical Record Number: [id number] Patient Account  Number: [id number] Date of Birth/Sex: 02-07-1929 (81 y.o. Female) Treating RN: Curtis Sites Primary Care Physician: Marisue Ivan Other Clinician: Referring Physician: Marisue Ivan Treating Physician/Extender: Rudene Re in Treatment: 52 Verbal / Phone Orders: Yes Clinician: Curtis Sites Read Back and Verified: Yes Diagnosis Coding Wound Cleansing Wound #1 Right,Lateral Malleolus o Clean wound with Normal Saline. o Cleanse wound with mild soap and water Wound #2 Right,Proximal Lower Leg o Clean wound with Normal Saline. o Cleanse wound with mild soap and water Anesthetic Wound #1 Right,Lateral Malleolus o Topical Lidocaine 4% cream applied to wound bed prior to debridement Wound #2 Right,Proximal Lower Leg o Topical Lidocaine 4% cream applied to wound bed prior to debridement Skin Barriers/Peri-Wound Care Wound #1 Right,Lateral Malleolus o Skin Prep Wound #2 Right,Proximal Lower Leg o Skin Prep Primary Wound Dressing Wound #1 Right,Lateral Malleolus o Santyl Ointment Secondary Dressing Wound #1 Right,Lateral Malleolus o Boardered Foam Dressing Wound #2 Right,Proximal Lower Leg o Boardered Foam Dressing Dressing Change Frequency Wound #1 Right,Lateral Malleolus Bechler, Gabriele S. ([id number]) o Change dressing every day. Wound #2 Right,Proximal Lower Leg o Change dressing every day. Follow-up Appointments Wound #1 Right,Lateral Malleolus o Return Appointment in 1 week. Wound #2 Right,Proximal Lower Leg o Return Appointment in 1 week. Edema Control Wound #1 Right,Lateral Malleolus o Other: - juxtalite Wound #2 Right,Proximal Lower Leg o Other: - juxtalite Additional Orders /  Instructions Wound #1 Right,Lateral Malleolus o Increase protein intake. o Activity as tolerated o Other: - juxtalite ordered Wound #2 Right,Proximal Lower Leg o Increase protein intake. o Activity as tolerated o Other: - juxtalite ordered Electronic Signature(s) Signed: 09/09/2014 2:29:15 PM By: Curtis Sites Signed: 09/09/2014 1:32:08 PM By: Evlyn Kanner MD, FACS Entered By: Curtis Sites on 09/09/2014 14:14:15 Joanna Roberts, Joanna Roberts ([id number]) -------------------------------------------------------------------------------- Problem List Details Patient Name: Joanna Roberts, Joanna Roberts. Date of Service: 09/09/2014 1:45 PM Medical Record Number: [id number] Patient Account Number: [id number] Date of Birth/Sex: [birth year] (81 y.o. 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 Date of Service: 09/09/2014 1:45 PM Medical Record Number: [id number] Patient Account Number: [id number] Date of Birth/Sex: Jun 05, 1928 (81 y.o. Female) Treating RN: Huel Coventry Primary Care Physician: Marisue Ivan Other Clinician: Referring Physician: Marisue Ivan Treating Physician/Extender: Rudene Re in Treatment: 14 Subjective Chief Complaint Information obtained from Patient right lateral malleolus wound. She says she has seen a vascular surgeon and recently about 2 weeks ago. I have reviewed the reports from Dr. Gilda Crease. was unable to fill a prescription for Santyl because of the cost and they have only been using Desitin and Prisma over the wound. 08/12/2014 -- the patient has not seen [place] for about 14 days now and she says that recently she noticed that her left leg was having a little bit of pain and there was a redness over the lateral malleoli on the left side too. The daughter has her own ailments and has not been able to do dressings  and the home health nurses only come twice a week so they have not been doing dressings more often. History of Present Illness (HPI) The following HPI elements were documented for the patient's wound: Location: the patient has a wound on the right lateral malleolus. Duration: she states she has had the wound for a few months now. Context: the patient is in place and a dry dressing on the wound. Modifying Factors: she is already had a vascular evaluation and the vascular surgeon stated that there was enough inflow velocity healed wound. Associated Signs and Symptoms: denies any fevers. the patient is an 81 year old female who was not a diabetic who presents with a right lateral malleolar wound. The patient returns for followup today. She denies fevers. she says she has had several procedures on her right leg in the past but she does not know if the arterial or venous. She has known that the left leg also had some surgery for some blockages. 06/20/14 -- the reports from the vascular surgeon Dr. Gilda Crease were that her arterial pressure was enough to heal the wound and he would recommend Jobst ulcer compressions stockings to be worn form morning to night. with the patient has not been compliant with this because she finds it impossible to  put them on. The daughter does say that in the coming week she will go down and try and help her with this. 06/27/14 she has been using the idosorb dressings and the daughter has been helping to do it on alternate days. other than that there are no fresh issues. 06/27/14 she's been doing fine and has no present problems. She was seen in the vascular surgeon's office recently and they have not made any changes to her treatment. 07/01/14 -- the patient is here today because she was concerned that the pain in the lateral part of the ankle is significant and that her legs are swollen. However on questioning her she has not been using her Joanna Roberts, Joanna Roberts. ([id number]) compression stockings at all. We had got a juxta light stockings to help her these on easily. however the family members and she have not been able to get this done on a regular basis. 07/08/2014 -- the patient has now begun wearing husband juxta light stockings on a regular basis though she is not wearing the inside stocking. She continues to have mild pain in that area and other than that has no discharge or fever. 07/15/2014 -- they have now been very compliant with the dressing and application of the juxta light stocking on a regular basis. no fresh issues. 07/22/2014 -- she had a fall the other day in the parking lot and hurt both elbows which were just minor abrasions and she has a dressing in place. She did not want [place] to look at those wounds and she says they're minor abrasions. The dressing was done by the family members and she did not go to the ER nor did she have any x-rays. She says she is getting better from this. 07/29/2014 the daughter was concerned regarding some redness around the wound and came a day early. No fever or any other symptoms. Objective Constitutional Pulse regular. Respirations normal and unlabored. Afebrile. Vitals Time Taken: 1:56 PM, Height: 60 in, Weight: 141.5 lbs, BMI: 27.6, Temperature: 98.7 F,  Pulse: 73 bpm, Respiratory Rate: 18 breaths/min, Blood Pressure: 117/66 mmHg. Eyes Nonicteric. Reactive to light. Ears, Nose, Mouth, and Throat Lips, teeth, and gums WNL.Marland Kitchen Moist mucosa without lesions . Neck supple and  nontender. No palpable supraclavicular or cervical adenopathy. Normal sized without goiter. Respiratory WNL. No retractions.. Cardiovascular Pedal Pulses WNL. No clubbing, cyanosis or edema. Psychiatric Judgement and insight Intact.. No evidence of depression, anxiety, or agitation.Marland Kitchen Joanna Roberts, Joanna Roberts. ([id number]) Integumentary (Hair, Skin) there is a plug of fibrin in the small wound and other than that it looks fairly good. A small area of excoriation just superior anterior to this is not ulcerated.. No crepitus or fluctuance. No peri-wound warmth or erythema. No masses.. Wound #1 status is Open. Original cause of wound was Gradually Appeared. The wound is located on the Right,Lateral Malleolus. The wound measures 0.2cm length x 0.3cm width x 0.2cm depth; 0.047cm^2 area and 0.009cm^3 volume. The wound is limited to skin breakdown. There is a small amount of serous drainage noted. The wound margin is flat and intact. There is no granulation within the wound bed. There is a large (67-100%) amount of necrotic tissue within the wound bed including Adherent Slough. The periwound skin appearance exhibited: Localized Edema, Maceration, Moist, Hemosiderin Staining, Erythema. The periwound skin appearance did not exhibit: Callus, Crepitus, Excoriation, Fluctuance, Friable, Induration, Rash, Scarring, Dry/Scaly, Atrophie Blanche, Cyanosis, Ecchymosis, Mottled, Pallor, Rubor. The surrounding wound skin color is noted with erythema which is circumferential. Periwound temperature was noted as No Abnormality. The periwound has tenderness on palpation. Wound #2 status is Open. Original cause of wound was Gradually Appeared. The wound is located on the Right,Proximal Lower Leg. The wound  measures 1cm length x 0.8cm width x 0.1cm depth; 0.628cm^2 area and 0.063cm^3 volume. The wound is limited to skin breakdown. There is no tunneling or undermining noted. There is a small amount of serous drainage noted. The wound margin is flat and intact. There is small (1-33%) granulation within the wound bed. There is no necrotic tissue within the wound bed. The periwound skin appearance exhibited: Excoriation, Erythema. The periwound skin appearance did not exhibit: Callus, Crepitus, Fluctuance, Friable, Induration, Localized Edema, Rash, Scarring, Dry/Scaly, Maceration, Moist, Atrophie Blanche, Cyanosis, Ecchymosis, Hemosiderin Staining, Mottled, Pallor, Rubor. The surrounding wound skin color is noted with erythema. 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 She continues to use a juxta lights on both lower extremities and I have asked her to use a couple of pillows at night to evaluate her limbs. Joanna Roberts, Joanna Roberts. ([id number]) She will see [place] back next week. Procedures Wound #1 Wound #1 is an Arterial Insufficiency Ulcer located on the Right,Lateral Malleolus . There was a Non-Viable Tissue Open Wound/Selective [phone]) debridement with total area of 0.06 sq cm performed by Jaymond Waage, Ignacia Felling., MD. with the following instrument(s): Forceps to remove Non-Viable tissue/material including Fibrin/Slough and Eschar after achieving pain control using Lidocaine 4% Topical Solution. A time out was conducted prior to the start of the procedure. There was no bleeding. The procedure was tolerated well with a pain level of 0 throughout and a pain level of 0 following the procedure. Post  Debridement Measurements: 0.2cm length x 0.3cm width x 0.2cm depth; 0.009cm^3 volume. Plan Wound Cleansing: Wound #1 Right,Lateral Malleolus: Clean wound with Normal Saline. Cleanse wound with mild soap and water Wound #2 Right,Proximal Lower Leg: Clean wound with Normal Saline. Cleanse wound with mild soap and  water Anesthetic: Wound #1 Right,Lateral Malleolus: Topical Lidocaine 4% cream applied to wound bed prior to debridement Wound #2 Right,Proximal Lower Leg: Topical Lidocaine 4% cream applied to wound bed prior to debridement Skin Barriers/Peri-Wound Care: Wound #1 Right,Lateral Malleolus: Skin Prep Wound #2 Right,Proximal Lower Leg: Skin Prep Primary Wound Dressing: Wound #1 Right,Lateral Malleolus: Santyl Ointment Secondary Dressing: Wound #1 Right,Lateral Malleolus: Boardered Foam Dressing Wound #2 Right,Proximal Lower Leg: Boardered Foam Dressing Dressing Change Frequency: Wound #1 Right,Lateral Malleolus: Change dressing every day. Wound #2 Right,Proximal Lower Leg: Change dressing every day. Joanna Roberts, Joanna Roberts ([id number]) Follow-up Appointments: Wound #1 Right,Lateral Malleolus: Return Appointment in 1 week. Wound #2 Right,Proximal Lower Leg: Return Appointment in 1 week. Edema Control: Wound #1 Right,Lateral Malleolus: Other: - juxtalite Wound #2 Right,Proximal Lower Leg: Other: - juxtalite Additional Orders / Instructions: Wound #1 Right,Lateral Malleolus: Increase protein intake. Activity as tolerated Other: - juxtalite ordered Wound #2 Right,Proximal Lower Leg: Increase protein intake. Activity as tolerated Other: - juxtalite ordered We will continue to use Santyl in the wound and a border foam to protect the surrounding skin. She continues to use a juxta lights on both lower extremities and I have asked her to use a couple of pillows at night to evaluate her limbs. She will see [place] back next week. Electronic Signature(s) Signed: 09/09/2014 1:32:08  PM By: Evlyn Kanner MD, FACS Entered By: Evlyn Kanner on 09/09/2014 14:19:30 Joanna Roberts, RYLEE ([id number]) -------------------------------------------------------------------------------- SuperBill Details Patient Name: Joanna Roberts. Date of Service: 09/09/2014 Medical Record Number: [id number] Patient Account Number: [id number] Date of Birth/Sex: 1928/12/15 (81 y.o. 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 I70.201 Unspecified atherosclerosis of native arteries of extremities, right leg Facility Procedures CPT4 Code Description: [id number] - DEBRIDE WOUND 1ST 20 SQ CM OR < ICD-10 Description Diagnosis L97.312 Non-pressure chronic ulcer of right ankle with fat la I83.013 Varicose veins of right lower extremity with ulcer of I70.201 Unspecified  atherosclerosis of native arteries of ext Modifier: yer exposed ankle remities, righ Quantity: 1 t leg Physician Procedures CPT4 Code Description: [id number] - WC PHYS DEBR WO ANESTH 20 SQ CM ICD-10 Description Diagnosis L97.312 Non-pressure chronic ulcer of right ankle with fat lay I83.013 Varicose veins of right lower extremity with ulcer of I70.201 Unspecified  atherosclerosis of native arteries of extr Modifier: er exposed ankle emities, righ Quantity: 1 t leg Electronic Signature(s) Signed: 09/09/2014 1:32:08 PM By: Evlyn Kanner MD, FACS Entered By: Evlyn Kanner on 09/09/2014 14:19:43
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
## 2014-09-16 ENCOUNTER — Encounter: Payer: PPO | Admitting: Surgery

## 2014-09-16 DIAGNOSIS — L97312 Non-pressure chronic ulcer of right ankle with fat layer exposed: Secondary | ICD-10-CM | POA: Diagnosis not present
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
## 2014-09-17 NOTE — Progress Notes (Signed)
Joanna Roberts ([id number]) Visit Report for 09/16/2014 Chief Complaint Document Details Patient Name: Joanna Roberts, Joanna Roberts. Date of Service: 09/16/2014 2:30 PM Medical Record Number: [id number] Patient Account Number: [id number] Date of Birth/Sex: [birth year] (81 y.o. Female) Treating RN: Kristin Bruins Primary Care Physician: Marisue Ivan Other Clinician: Referring Physician: Marisue Ivan Treating Physician/Extender: Rudene Re in Treatment: 15 Information Obtained from: Patient Chief Complaint right lateral malleolus wound. She says she has seen a vascular surgeon and recently about 2 weeks ago. I have reviewed the reports from Dr. Gilda Crease. was unable to fill a prescription for Santyl because of the cost and they have only been using Desitin and Prisma over the wound. 08/12/2014 -- the patient has not seen [place] for about 14 days now and she says that recently she noticed that her left leg was having a little bit of pain and there was a redness over the lateral malleoli on the left side too. The daughter has her own ailments and has not been able to do dressings and the home health nurses only come twice a week so they have not been doing dressings more often. Electronic Signature(s) Signed: 09/16/2014 5:41:46 PM By: Evlyn Kanner MD, FACS Entered By: Evlyn Kanner on 09/16/2014 15:44:48 Joanna Roberts, Joanna Roberts ([id number]) -------------------------------------------------------------------------------- Debridement Details Patient Name: Joanna Roberts. Date of Service: 09/16/2014 2:30 PM Medical Record Number: [id number] Patient Account Number: [id number] Date of Birth/Sex: [birth year] (81 y.o. Female) Treating RN: Kristin Bruins Primary Care Physician: Marisue Ivan Other Clinician: Referring Physician: Marisue Ivan Treating Physician/Extender: Rudene Re in Treatment: 15 Debridement Performed for Wound #1 Right,Lateral Malleolus Assessment: Performed By:  Physician Tristan Schroeder., MD Debridement: Open Wound/Selective Debridement Selective Description: Pre-procedure Yes Verification/Time Out Taken: Start Time: 15:20 Pain Control: Lidocaine 4% Topical Solution Total Area Debrided (L x 0.3 (cm) x 0.3 (cm) = 0.09 (cm) W): Tissue and other Non-Viable, Exudate, Fibrin/Slough material debrided: Instrument: Forceps Bleeding: None End Time: 15:22 Procedural Pain: 0 Post Procedural Pain: 0 Response to Treatment: Procedure was tolerated well Post Debridement Measurements of Total Wound Length: (cm) 0.3 Width: (cm) 0.3 Depth: (cm) 0.3 Volume: (cm) 0.021 Electronic Signature(s) Signed: 09/16/2014 5:11:50 PM By: Kristin Bruins Signed: 09/16/2014 5:41:46 PM By: Evlyn Kanner MD, FACS Entered By: Evlyn Kanner on 09/16/2014 15:44:35 [name], Joanna Roberts ([id number]) -------------------------------------------------------------------------------- HPI Details Patient Name: Joanna Roberts. Date of Service: 09/16/2014 2:30 PM Medical Record Number: [id number] Patient Account Number: [id number] Date of Birth/Sex: 12-Jan-1929 (81 y.o. Female) Treating RN: Kristin Bruins Primary Care Physician: Marisue Ivan Other Clinician: Referring Physician: Marisue Ivan Treating Physician/Extender: Rudene Re in Treatment: 15 History of Present Illness Location: the patient has a wound on the right lateral malleolus. Duration: she states she has had the wound for a few months now. Context: the patient is in place and a dry dressing on the wound. Modifying Factors: she is already had a vascular evaluation and the vascular surgeon stated that there was enough inflow velocity healed wound. Associated Signs and Symptoms: denies any fevers. HPI Description: the patient is an 81 year old female who was not a diabetic who presents with a right lateral malleolar wound. The patient returns for followup today. She denies fevers. she says she has  had several procedures on her right leg in the past but she does not know if the arterial or venous. She has known that the left leg also had some surgery for some blockages. 06/20/14 -- the reports from the vascular surgeon Dr. Gilda Crease were that her arterial  pressure was enough to heal the wound and he would recommend Jobst ulcer compressions stockings to be worn form morning to night. with the patient has not been compliant with this because she finds it impossible to put them on. The daughter does say that in the coming week she will go down and try and help her with this. 06/27/14 she has been using the idosorb dressings and the daughter has been helping to do it on alternate days. other than that there are no fresh issues. 06/27/14 she's been doing fine and has no present problems. She was seen in the vascular surgeon's office recently and they have not made any changes to her treatment. 07/01/14 -- the patient is here today because she was concerned that the pain in the lateral part of the ankle is significant and that her legs are swollen. However on questioning her she has not been using her compression stockings at all. We had got a juxta light stockings to help her these on easily. however the family members and she have not been able to get this done on a regular basis. 07/08/2014 -- the patient has now begun wearing husband juxta light stockings on a regular basis though she is not wearing the inside stocking. She continues to have mild pain in that area and other than that has no discharge or fever. 07/15/2014 -- they have now been very compliant with the dressing and application of the juxta light stocking on a regular basis. no fresh issues. 07/22/2014 -- she had a fall the other day in the parking lot and hurt both elbows which were just minor abrasions and she has a dressing in place. She did not want [place] to look at those wounds and she says they're minor abrasions. The dressing was  done by the family members and she did not go to the ER nor did she have any x-rays. She says she is getting better from this. Joanna Roberts, Joanna Roberts ([id number]) 07/29/2014 the daughter was concerned regarding some redness around the wound and came a day early. No fever or any other symptoms. 09/16/2014 -- the patient has been using her Juxtalite stockings but the problem is she does not wear them until about 10:30 or 11 every morning. Due to this she has some swelling in the legs and the edema does not get controlled well. Electronic Signature(s) Signed: 09/16/2014 5:41:46 PM By: Evlyn Kanner MD, FACS Entered By: Evlyn Kanner on 09/16/2014 15:45:57 Joanna Roberts, Joanna Roberts ([id number]) -------------------------------------------------------------------------------- Physical Exam Details Patient Name: Joanna Roberts. Date of Service: 09/16/2014 2:30 PM Medical Record Number: [id number] Patient Account Number: [id number] Date of Birth/Sex: 01-26-1929 (81 y.o. Female) Treating RN: Kristin Bruins Primary Care Physician: Marisue Ivan Other Clinician: Referring Physician: Marisue Ivan Treating Physician/Extender: Rudene Re in Treatment: 15 Constitutional . Pulse regular. Respirations normal and unlabored. Afebrile. . Eyes Nonicteric. Reactive to light. Ears, Nose, Mouth, and Throat Lips, teeth, and gums WNL.Marland Kitchen Moist mucosa without lesions . Neck supple and nontender. No palpable supraclavicular or cervical adenopathy. Normal sized without goiter. Respiratory WNL. No retractions.. Cardiovascular Pedal Pulses WNL. No clubbing, cyanosis or edema. Integumentary (Hair, Skin) the right lateral malleolar has same punched out ulcer as she had had before and it gets filled with fibrin. Just superior to this she has a small area of about 2 cm where she has some superficial abrasions but this is not an open ulcer.. No crepitus or fluctuance. No peri-wound warmth or erythema. No  masses.Marland Kitchen Psychiatric Judgement and  insight Intact.. No evidence of depression, anxiety, or agitation.. Electronic Signature(s) Signed: 09/16/2014 5:41:46 PM By: Evlyn Kanner MD, FACS Entered By: Evlyn Kanner on 09/16/2014 15:47:22 Joanna Roberts, Joanna Roberts ([id number]) -------------------------------------------------------------------------------- Physician Orders Details Patient Name: Joanna Roberts. Date of Service: 09/16/2014 2:30 PM Medical Record Number: [id number] Patient Account Number: [id number] Date of Birth/Sex: 1929-01-06 (81 y.o. Female) Treating RN: Curtis Sites Primary Care Physician: Marisue Ivan Other Clinician: Referring Physician: Marisue Ivan Treating Physician/Extender: Rudene Re in Treatment: 15 Verbal / Phone Orders: Yes Clinician: Curtis Sites Read Back and Verified: Yes Diagnosis Coding Wound Cleansing Wound #1 Right,Lateral Malleolus o Clean wound with Normal Saline. o Cleanse wound with mild soap and water Anesthetic Wound #1 Right,Lateral Malleolus o Topical Lidocaine 4% cream applied to wound bed prior to debridement Skin Barriers/Peri-Wound Care Wound #1 Right,Lateral Malleolus o Skin Prep Primary Wound Dressing Wound #1 Right,Lateral Malleolus o Santyl Ointment Secondary Dressing Wound #1 Right,Lateral Malleolus o Boardered Foam Dressing Dressing Change Frequency Wound #1 Right,Lateral Malleolus o Change dressing every day. Follow-up Appointments Wound #1 Right,Lateral Malleolus o Return Appointment in 1 week. Edema Control Wound #1 Right,Lateral Malleolus o Other: - juxtalite Additional Orders / Instructions Lampley, Kaidence S. ([id number]) Wound #1 Right,Lateral Malleolus o Increase protein intake. o Activity as tolerated o Other: - juxtalite ordered Electronic Signature(s) Signed: 09/16/2014 5:41:46 PM By: Evlyn Kanner MD, FACS Signed: 09/17/2014 3:12:27 PM By: Curtis Sites Entered By: Curtis Sites on 09/16/2014 15:22:08 Joanna Roberts, Joanna Roberts ([id number]) -------------------------------------------------------------------------------- Problem List Details Patient Name: APOLONIA, ELLWOOD. Date of Service: 09/16/2014 2:30 PM Medical Record Number: [id number] Patient Account Number: [id number] Date of Birth/Sex: 17-Mar-1929 (81 y.o. 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 Date of Service: 09/16/2014 2:30 PM Medical Record Number: [id number] Patient Account Number: [id number] Date of Birth/Sex: 11-03-1928 (81 y.o. Female) Treating RN: Kristin Bruins Primary Care Physician: Marisue Ivan Other Clinician: Referring Physician: Marisue Ivan Treating Physician/Extender: Rudene Re in Treatment: 15 Subjective Chief Complaint Information obtained from Patient right lateral malleolus wound. She says she has seen a vascular surgeon and recently about 2 weeks ago. I have reviewed the reports from Dr. Gilda Crease. was unable to fill a prescription for Santyl because of the cost and they have only been using Desitin and Prisma over the wound. 08/12/2014 -- the patient  has not seen [place] for about 14 days now and she says that recently she noticed that her left leg was having a little bit of pain and there was a redness over the lateral malleoli on the left side too. The daughter has her own ailments and has not been able to do dressings and the home health nurses only come twice a week so they have not been doing dressings more often. History of Present Illness (HPI) The following HPI elements were documented for the patient's wound: Location: the patient has a wound on the right lateral malleolus. Duration: she states she  has had the wound for a few months now. Context: the patient is in place and a dry dressing on the wound. Modifying Factors: she is already had a vascular evaluation and the vascular surgeon stated that there was enough inflow velocity healed wound. Associated Signs and Symptoms: denies any fevers. the patient is an 81 year old female who was not a diabetic who presents with a right lateral malleolar wound. The patient returns for followup today. She denies fevers. she says she has had several procedures on her right leg in the past but she does not know if the arterial or venous. She has known that the left leg also had some surgery for some blockages. 06/20/14 -- the reports from the vascular surgeon Dr. Gilda Crease were that her arterial pressure was enough to heal the wound and he would recommend Jobst ulcer compressions stockings to be worn form morning to night. with the patient has not been compliant with this because she finds it impossible to put them on. The daughter does say that in the coming week she will go down and try and help her with this. 06/27/14 she has been using the idosorb dressings and the daughter has been helping to do it on alternate days. other than that there are no fresh issues. 06/27/14 she's been doing fine and has no present problems. She was seen in the vascular surgeon's office recently and they have not made  any changes to her treatment. 07/01/14 -- the patient is here today because she was concerned that the pain in the lateral part of the ankle is significant and that her legs are swollen. However on questioning her she has not been using her Joanna Roberts, Joanna Roberts. ([id number]) compression stockings at all. We had got a juxta light stockings to help her these on easily. however the family members and she have not been able to get this done on a regular basis. 07/08/2014 -- the patient has now begun wearing husband juxta light stockings on a regular basis though she is not wearing the inside stocking. She continues to have mild pain in that area and other than that has no discharge or fever. 07/15/2014 -- they have now been very compliant with the dressing and application of the juxta light stocking on a regular basis. no fresh issues. 07/22/2014 -- she had a fall the other day in the parking lot and hurt both elbows which were just minor abrasions and she has a dressing in place. She did not want [place] to look at those wounds and she says they're minor abrasions. The dressing was done by the family members and she did not go to the ER nor did she have any x-rays. She says she is getting better from this. 07/29/2014 the daughter was concerned regarding some redness around the wound and came a day early. No fever or any other symptoms. 09/16/2014 -- the patient has been using her Juxtalite stockings but the problem is she does not wear them until about 10:30 or 11 every morning. Due to this she has some swelling in the legs and the edema does not get controlled well. Objective Constitutional Pulse regular. Respirations normal and unlabored. Afebrile. Vitals Time Taken: 3:00 PM, Height: 60 in, Weight: 141.5 lbs, BMI: 27.6, Temperature: 98 F, Pulse: 81 bpm, Respiratory Rate: 18 breaths/min, Blood Pressure: 106/69 mmHg. Eyes Nonicteric. Reactive to light. Ears, Nose, Mouth, and Throat Lips, teeth, and gums  WNL.Marland Kitchen Moist mucosa without lesions . Neck supple and nontender. No palpable supraclavicular or  cervical adenopathy. Normal sized without goiter. Respiratory WNL. No retractions.. Cardiovascular Pedal Pulses WNL. No clubbing, cyanosis or edema. Psychiatric Joanna Roberts, Joanna Roberts ([id number]) Judgement and insight Intact.. No evidence of depression, anxiety, or agitation.. Integumentary (Hair, Skin) the right lateral malleolar has same punched out ulcer as she had had before and it gets filled with fibrin. Just superior to this she has a small area of about 2 cm where she has some superficial abrasions but this is not an open ulcer.. No crepitus or fluctuance. No peri-wound warmth or erythema. No masses.. Wound #1 status is Open. Original cause of wound was Gradually Appeared. The wound is located on the Right,Lateral Malleolus. The wound measures 0.3cm length x 0.3cm width x 0.2cm depth; 0.071cm^2 area and 0.014cm^3 volume. The wound is limited to skin breakdown. There is no tunneling or undermining noted. There is a small amount of serous drainage noted. The wound margin is flat and intact. There is no granulation within the wound bed. There is a large (67-100%) amount of necrotic tissue within the wound bed including Adherent Slough. The periwound skin appearance exhibited: Moist. The periwound skin appearance did not exhibit: Callus, Crepitus, Excoriation, Fluctuance, Friable, Induration, Localized Edema, Rash, Scarring, Dry/Scaly, Maceration, Atrophie Blanche, Cyanosis, Ecchymosis, Hemosiderin Staining, Mottled, Pallor, Rubor, Erythema. Periwound temperature was noted as No Abnormality. The periwound has tenderness on palpation. Wound #2 status is Healed - Epithelialized. Original cause of wound was Gradually Appeared. The wound is located on the Right,Proximal Lower Leg. The wound measures 0cm length x 0cm width x 0cm depth; 0cm^2 area and 0cm^3 volume. The right lateral malleolar has same  punched out ulcer as she had had before and it gets filled with fibrin. 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 She says she is going to be more compliant. Joanna Roberts, Joanna Roberts ([id number]) Other than that we will continue with Santyl locally and again insist on wearing the juxta light stockings first thing in the morning and taking them outlasting at night. Procedures Wound #1 Wound #1 is an Arterial Insufficiency Ulcer located on the Right,Lateral Malleolus . There was an Open Wound debridement with total area of 0.09 sq cm performed by Velva Molinari, Ignacia Felling., MD. with the following instrument(s): Forceps to remove Non-Viable tissue/material including Exudate and Fibrin/Slough after achieving pain control using Lidocaine 4% Topical Solution. A time out was conducted prior to the start of the procedure. There was no bleeding. The procedure was tolerated well with a pain level of 0 throughout and a pain level of 0 following the procedure. Post Debridement Measurements: 0.3cm length x 0.3cm width x 0.3cm depth; 0.021cm^3 volume. Plan Wound Cleansing: Wound #1 Right,Lateral Malleolus: Clean wound with Normal Saline. Cleanse wound with mild soap and water Anesthetic: Wound #1 Right,Lateral Malleolus: Topical Lidocaine 4% cream applied to wound bed prior to debridement Skin Barriers/Peri-Wound Care: Wound #1 Right,Lateral Malleolus: Skin Prep Primary Wound Dressing: Wound #1 Right,Lateral Malleolus: Santyl  Ointment Secondary Dressing: Wound #1 Right,Lateral Malleolus: Boardered Foam Dressing Dressing Change Frequency:  Wound #1 Right,Lateral Malleolus: Change dressing every day. Follow-up Appointments: Wound #1 Right,Lateral Malleolus: Return Appointment in 1 week. Edema Control: Wound #1 Right,Lateral Malleolus: Other: - juxtalite Additional Orders / Instructions: Wound #1 Right,Lateral Malleolus: Increase protein intake. Activity as tolerated Fahy, Galileah S. ([id number]) Other: - juxtalite ordered I have reiterated to the patient and her daughter that the compression bandage should be applied first thing in the morning when she gets out of bed. She says she is going to be more compliant. Other than that we will continue with Santyl locally and again insist on wearing the juxta light stockings first thing in the morning and taking them outlasting at night. Electronic Signature(s) Signed: 09/16/2014 5:41:46 PM By: Evlyn Kanner MD, FACS Entered By: Evlyn Kanner on 09/16/2014 15:48:44 Joanna Roberts, Joanna Roberts ([id number]) -------------------------------------------------------------------------------- SuperBill Details Patient Name: Joanna Roberts. Date of Service: 09/16/2014 Medical Record Number: [id number] Patient Account Number: [id number] Date of Birth/Sex: 06-22-1928 (81 y.o. 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 I70.201 Unspecified atherosclerosis of native arteries of extremities, right leg Facility Procedures CPT4 Code Description: [id number] - DEBRIDE WOUND 1ST 20 SQ CM OR < ICD-10 Description Diagnosis L97.312 Non-pressure chronic  ulcer of right ankle with fat la I83.013 Varicose veins of right lower extremity with ulcer of I70.201 Unspecified  atherosclerosis of native arteries of ext Modifier: yer exposed ankle remities, righ Quantity: 1 t leg Physician Procedures CPT4 Code Description: [id number] - WC PHYS DEBR WO ANESTH 20 SQ CM ICD-10 Description Diagnosis L97.312 Non-pressure chronic ulcer of right ankle with fat lay I83.013 Varicose veins of right lower extremity with ulcer of I70.201 Unspecified  atherosclerosis of native arteries of extr Modifier: er exposed ankle emities, righ Quantity: 1 t leg Electronic Signature(s) Signed: 09/16/2014 5:41:46 PM By: Evlyn Kanner MD, FACS Entered By: Evlyn Kanner on 09/16/2014 15:49:02

## 2014-09-26 ENCOUNTER — Ambulatory Visit: Payer: PPO | Admitting: Surgery

## 2014-09-27 ENCOUNTER — Encounter: Payer: PPO | Attending: Surgery | Admitting: Surgery

## 2014-09-27 DIAGNOSIS — I70201 Unspecified atherosclerosis of native arteries of extremities, right leg: Secondary | ICD-10-CM | POA: Diagnosis not present

## 2014-09-27 DIAGNOSIS — L97311 Non-pressure chronic ulcer of right ankle limited to breakdown of skin: Secondary | ICD-10-CM | POA: Diagnosis not present

## 2014-09-27 DIAGNOSIS — I83013 Varicose veins of right lower extremity with ulcer of ankle: Secondary | ICD-10-CM | POA: Diagnosis not present

## 2014-09-28 NOTE — Progress Notes (Signed)
Joanna Roberts, Joanna Roberts ([id number]) Visit Report for 09/27/2014 Chief Complaint Document Details Patient Name: Joanna Roberts. Date of Service: 09/27/2014 3:45 PM Medical Record Number: [id number] Patient Account Number: [id number] Date of Birth/Sex: October 30, 1928 (81 y.o. Female) Treating RN: Primary Care Physician: Marisue Ivan Other Clinician: Referring Physician: Marisue Ivan Treating Physician/Extender: Rudene Re in Treatment: 17 Information Obtained from: Patient Chief Complaint right lateral malleolus wound. She says she has seen a vascular surgeon and recently about 2 weeks ago. I have reviewed the reports from Dr. Gilda Crease. was unable to fill a prescription for Santyl because of the cost and they have only been using Desitin and Prisma over the wound. 08/12/2014 -- the patient has not seen [place] for about 14 days now and she says that recently she noticed that her left leg was having a little bit of pain and there was a redness over the lateral malleoli on the left side too. The daughter has her own ailments and has not been able to do dressings and the home health nurses only come twice a week so they have not been doing dressings more often. Electronic Signature(s) Signed: 09/27/2014 4:59:10 PM By: Evlyn Kanner MD, FACS Entered By: Evlyn Kanner on 09/27/2014 16:52:15 Joanna Roberts, Joanna Roberts ([id number]) -------------------------------------------------------------------------------- HPI Details Patient Name: Joanna Roberts. Date of Service: 09/27/2014 3:45 PM Medical Record Number: [id number] Patient Account Number: [id number] Date of Birth/Sex: Mar 16, 1929 (81 y.o. Female) Treating RN: Primary Care Physician: Marisue Ivan Other Clinician: Referring Physician: Marisue Ivan Treating Physician/Extender: Rudene Re in Treatment: 17 History of Present Illness Location: the patient has a wound on the right lateral malleolus. Duration: she states she has had the  wound for a few months now. Context: the patient is in place and a dry dressing on the wound. Modifying Factors: she is already had a vascular evaluation and the vascular surgeon stated that there was enough inflow velocity healed wound. Associated Signs and Symptoms: denies any fevers. HPI Description: the patient is an 81 year old female who was not a diabetic who presents with a right lateral malleolar wound. The patient returns for followup today. She denies fevers. she says she has had several procedures on her right leg in the past but she does not know if the arterial or venous. She has known that the left leg also had some surgery for some blockages. 06/20/14 -- the reports from the vascular surgeon Dr. Gilda Crease were that her arterial pressure was enough to heal the wound and he would recommend Jobst ulcer compressions stockings to be worn form morning to night. with the patient has not been compliant with this because she finds it impossible to put them on. The daughter does say that in the coming week she will go down and try and help her with this. 06/27/14 she has been using the idosorb dressings and the daughter has been helping to do it on alternate days. other than that there are no fresh issues. 06/27/14 she's been doing fine and has no present problems. She was seen in the vascular surgeon's office recently and they have not made any changes to her treatment. 07/01/14 -- the patient is here today because she was concerned that the pain in the lateral part of the ankle is significant and that her legs are swollen. However on questioning her she has not been using her compression stockings at all. We had got a juxta light stockings to help her these on easily. however the family members and she have not been  able to get this done on a regular basis. 07/08/2014 -- the patient has now begun wearing husband juxta light stockings on a regular basis though she is not wearing the inside  stocking. She continues to have mild pain in that area and other than that has no discharge or fever. 07/15/2014 -- they have now been very compliant with the dressing and application of the juxta light stocking on a regular basis. no fresh issues. 07/22/2014 -- she had a fall the other day in the parking lot and hurt both elbows which were just minor abrasions and she has a dressing in place. She did not want [place] to look at those wounds and she says they're minor abrasions. The dressing was done by the family members and she did not go to the ER nor did she have any x-rays. She says she is getting better from this. Joanna Roberts ([id number]) 07/29/2014 the daughter was concerned regarding some redness around the wound and came a day early. No fever or any other symptoms. 09/16/2014 -- the patient has been using her Juxtalite stockings but the problem is she does not wear them until about 10:30 or 11 every morning. Due to this she has some swelling in the legs and the edema does not get controlled well. 09/27/2014 -- an area just to the superior medial part of her previous ulceration has now opened out and this is possibly a superficial abrasion. She has noticed it recently. Electronic Signature(s) Signed: 09/27/2014 4:59:10 PM By: Evlyn Kanner MD, FACS Entered By: Evlyn Kanner on 09/27/2014 16:53:05 Joanna Roberts, Joanna Roberts ([id number]) -------------------------------------------------------------------------------- Physical Exam Details Patient Name: Joanna Roberts. Date of Service: 09/27/2014 3:45 PM Medical Record Number: [id number] Patient Account Number: [id number] Date of Birth/Sex: [birth year] (81 y.o. Female) Treating RN: Primary Care Physician: Marisue Ivan Other Clinician: Referring Physician: Marisue Ivan Treating Physician/Extender: Rudene Re in Treatment: 17 Constitutional . Pulse regular. Respirations normal and unlabored. Afebrile. . Eyes Nonicteric. Reactive  to light. Ears, Nose, Mouth, and Throat Lips, teeth, and gums WNL.Marland Kitchen Moist mucosa without lesions . Neck supple and nontender. No palpable supraclavicular or cervical adenopathy. Normal sized without goiter. Respiratory WNL. No retractions.. Cardiovascular Pedal Pulses WNL. minimal pedal edema right lower extremity and obvious varicose veins.. Integumentary (Hair, Skin) she has got a superficial abrasion along the lateral malleoli and this is just superior medial to the area where she previous had a small punched out ulcer.. No crepitus or fluctuance. No peri-wound warmth or erythema. No masses.Marland Kitchen Psychiatric Judgement and insight Intact.. No evidence of depression, anxiety, or agitation.. Electronic Signature(s) Signed: 09/27/2014 4:59:10 PM By: Evlyn Kanner MD, FACS Entered By: Evlyn Kanner on 09/27/2014 16:53:58 Joanna Roberts, Joanna Roberts ([id number]) -------------------------------------------------------------------------------- Physician Orders Details Patient Name: Joanna Roberts. Date of Service: 09/27/2014 3:45 PM Medical Record Number: [id number] Patient Account Number: [id number] Date of Birth/Sex: [birth year] (81 y.o. Female) Treating RN: Huel Coventry Primary Care Physician: Marisue Ivan Other Clinician: Referring Physician: Marisue Ivan Treating Physician/Extender: Rudene Re in Treatment: 13 Verbal / Phone Orders: Yes Clinician: Huel Coventry Read Back and Verified: Yes Diagnosis Coding Wound Cleansing Wound #1 Right,Lateral Malleolus o Clean wound with Normal Saline. o Cleanse wound with mild soap and water Anesthetic Wound #1 Right,Lateral Malleolus o Topical Lidocaine 4% cream applied to wound bed prior to debridement Skin Barriers/Peri-Wound Care Wound #1 Right,Lateral Malleolus o Skin Prep Primary Wound Dressing Wound #1 Right,Lateral Malleolus o Prisma Ag Secondary Dressing Wound #1 Right,Lateral Malleolus o Boardered Foam  Dressing  Dressing Change Frequency Wound #1 Right,Lateral Malleolus o Change dressing every other day. Follow-up Appointments Wound #1 Right,Lateral Malleolus o Return Appointment in 1 week. Edema Control Wound #1 Right,Lateral Malleolus o Other: - juxtalite - no sock on the right foot Additional Orders / Instructions Flewellen, Shalayah S. ([id number]) Wound #1 Right,Lateral Malleolus o Increase protein intake. o Activity as tolerated Electronic Signature(s) Signed: 09/27/2014 4:59:10 PM By: Evlyn Kanner MD, FACS Signed: 09/27/2014 5:11:17 PM By: Elliot Gurney RN, BSN, Kim RN, BSN Entered By: Elliot Gurney, RN, BSN, Kim on 09/27/2014 16:47:15 Joanna Roberts, Joanna Roberts ([id number]) -------------------------------------------------------------------------------- Problem List Details Patient Name: DAWSON, HOLLMAN. Date of Service: 09/27/2014 3:45 PM Medical Record Number: [id number] Patient Account Number: [id number] Date of Birth/Sex: [birth year] (81 y.o. 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 Date of Service: 09/27/2014  3:45 PM Medical Record Number: [id number] Patient Account Number: [id number] Date of Birth/Sex: 01/13/1929 (81 y.o. Female) Treating RN: Primary Care Physician: Marisue Ivan Other Clinician: Referring Physician: Marisue Ivan Treating Physician/Extender: Rudene Re in Treatment: 17 Subjective Chief Complaint Information obtained from Patient right lateral malleolus wound. She says she has seen a vascular surgeon and recently about 2 weeks ago. I have reviewed the reports from Dr. Gilda Crease. was unable to fill a prescription for Santyl because of the cost and they have only been using Desitin and Prisma over the wound. 08/12/2014 -- the patient has not seen [place] for about 14 days now and she says that recently she noticed that her left leg was having a little bit of pain and there was a redness over the lateral malleoli on the left side too. The daughter has her own ailments and has not been able to do dressings and the home health nurses only come twice a week so they have not been doing dressings more often. History of Present Illness (HPI) The following HPI elements were documented for the patient's wound: Location: the patient has a wound on the right lateral malleolus. Duration: she states she has had the wound for a few months now. Context: the patient is in place and a dry dressing on the wound. Modifying Factors: she is already had a vascular evaluation and the vascular surgeon stated that there was enough inflow velocity healed wound. Associated Signs and Symptoms: denies any fevers. the patient is an 81 year old female who was not a diabetic who presents with a right lateral malleolar wound. The patient returns for followup today. She denies fevers. she says she has had several procedures on her right leg in the past but she does not know if the arterial or venous. She has known that the left leg also had some surgery for some blockages. 06/20/14 -- the reports  from the vascular surgeon Dr. Gilda Crease were that her arterial pressure was enough to heal the wound and he would recommend Jobst ulcer compressions stockings to  be worn form morning to night. with the patient has not been compliant with this because she finds it impossible to put them on. The daughter does say that in the coming week she will go down and try and help her with this. 06/27/14 she has been using the idosorb dressings and the daughter has been helping to do it on alternate days. other than that there are no fresh issues. 06/27/14 she's been doing fine and has no present problems. She was seen in the vascular surgeon's office recently and they have not made any changes to her treatment. 07/01/14 -- the patient is here today because she was concerned that the pain in the lateral part of the ankle is significant and that her legs are swollen. However on questioning her she has not been using her Joanna Roberts, Joanna Roberts. ([id number]) compression stockings at all. We had got a juxta light stockings to help her these on easily. however the family members and she have not been able to get this done on a regular basis. 07/08/2014 -- the patient has now begun wearing husband juxta light stockings on a regular basis though she is not wearing the inside stocking. She continues to have mild pain in that area and other than that has no discharge or fever. 07/15/2014 -- they have now been very compliant with the dressing and application of the juxta light stocking on a regular basis. no fresh issues. 07/22/2014 -- she had a fall the other day in the parking lot and hurt both elbows which were just minor abrasions and she has a dressing in place. She did not want [place] to look at those wounds and she says they're minor abrasions. The dressing was done by the family members and she did not go to the ER nor did she have any x-rays. She says she is getting better from this. 07/29/2014 the daughter was concerned  regarding some redness around the wound and came a day early. No fever or any other symptoms. 09/16/2014 -- the patient has been using her Juxtalite stockings but the problem is she does not wear them until about 10:30 or 11 every morning. Due to this she has some swelling in the legs and the edema does not get controlled well. 09/27/2014 -- an area just to the superior medial part of her previous ulceration has now opened out and this is possibly a superficial abrasion. She has noticed it recently. Objective Constitutional Pulse regular. Respirations normal and unlabored. Afebrile. Vitals Time Taken: 4:26 PM, Height: 60 in, Weight: 141.5 lbs, BMI: 27.6, Temperature: 98.2 F, Pulse: 75 bpm, Respiratory Rate: 18 breaths/min, Blood Pressure: 129/65 mmHg. Eyes Nonicteric. Reactive to light. Ears, Nose, Mouth, and Throat Lips, teeth, and gums WNL.Marland Kitchen Moist mucosa without lesions . Neck supple and nontender. No palpable supraclavicular or cervical adenopathy. Normal sized without goiter. Respiratory WNL. No retractions.. Cardiovascular Joanna Roberts, Joanna S. ([id number]) Pedal Pulses WNL. minimal pedal edema right lower extremity and obvious varicose veins.. Psychiatric Judgement and insight Intact.. No evidence of depression, anxiety, or agitation.. Integumentary (Hair, Skin) she has got a superficial abrasion along the lateral malleoli and this is just superior medial to the area where she previous had a small punched out ulcer.. No crepitus or fluctuance. No peri-wound warmth or erythema. No masses.. Wound #1 status is Open. Original cause of wound was Gradually Appeared. The wound is located on the Right,Lateral Malleolus. The wound measures 1.5cm length x 0.3cm width x 0.3cm depth; 0.353cm^2 area and 0.106cm^3 volume.  The wound is limited to skin breakdown. There is no tunneling or undermining noted. There is a small amount of serous drainage noted. The wound margin is flat and intact. There  is small (1-33%) red granulation within the wound bed. There is a large (67-100%) amount of necrotic tissue within the wound bed including Adherent Slough. The periwound skin appearance exhibited: Moist. The periwound skin appearance did not exhibit: Callus, Crepitus, Excoriation, Fluctuance, Friable, Induration, Localized Edema, Rash, Scarring, Dry/Scaly, Maceration, Atrophie Blanche, Cyanosis, Ecchymosis, Hemosiderin Staining, Mottled, Pallor, Rubor, Erythema. Periwound temperature was noted as No Abnormality. The periwound has tenderness on palpation. 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 I have asked them to use Prisma AG over this wound and stop using Santyl. I also recommended stopping the use of the inner sock for the time being and just using the upper part of her juxta light stocking. Her daughter is not here with her but my nurse will call and discuss the dressing changes. Joanna Roberts, Joanna Roberts. ([id number]) She will come back and see [place] next week. Plan Wound Cleansing: Wound #1 Right,Lateral Malleolus: Clean wound with Normal Saline. Cleanse wound with mild soap and water Anesthetic: Wound #1 Right,Lateral Malleolus: Topical Lidocaine 4% cream applied to wound bed prior to debridement Skin Barriers/Peri-Wound Care: Wound #1 Right,Lateral Malleolus: Skin Prep Primary Wound Dressing: Wound #1 Right,Lateral Malleolus: Prisma Ag Secondary Dressing: Wound #1 Right,Lateral Malleolus: Boardered Foam Dressing Dressing Change  Frequency: Wound #1 Right,Lateral Malleolus: Change dressing every other day. Follow-up Appointments: Wound #1 Right,Lateral Malleolus: Return Appointment in 1 week. Edema Control: Wound #1 Right,Lateral Malleolus: Other: - juxtalite - no sock on the right foot Additional Orders / Instructions: Wound #1 Right,Lateral Malleolus: Increase protein intake. Activity as tolerated The new ulceration may be either due to abrasion of the inner sock used with her juxta light or it may be Santyl ointment oozing out of the ulcerated area. I have asked them to use Prisma AG over this wound and stop using Santyl. I also recommended stopping the use of the inner sock for the time being and just using the upper part of her juxta light stocking. Her daughter is not here with her but my nurse will call and discuss the dressing changes. Joanna Roberts, Joanna Roberts. ([id number]) She will come back and see [place] next week. Electronic Signature(s) Signed: 09/27/2014 4:59:10 PM By: Evlyn Kanner MD, FACS Entered By: Evlyn Kanner on 09/27/2014 16:56:53 Joanna Roberts, Joanna Roberts ([id number]) -------------------------------------------------------------------------------- SuperBill Details Patient Name: Joanna Roberts. Date of Service: 09/27/2014 Medical Record Number: [id number] Patient Account Number: [id number] Date of Birth/Sex: [birth year] (81 y.o. Female) Treating RN: Primary Care Physician: Marisue Ivan Other Clinician: Referring Physician: Marisue Ivan Treating Physician/Extender: Rudene Re in Treatment: 17 Diagnosis Coding ICD-10 Codes Code Description [phone] Non-pressure chronic ulcer of right ankle with fat layer exposed I83.013 Varicose veins of right lower extremity with ulcer of ankle I70.201 Unspecified atherosclerosis of native arteries of extremities, right leg L97.311 Non-pressure chronic ulcer of right ankle limited to breakdown of skin Physician Procedures CPT4 Code Description: [id number]  [id number] - WC PHYS LEVEL 3 - EST PT ICD-10 Description Diagnosis L97.311 Non-pressure chronic ulcer of right ankle limited to L97.312 Non-pressure chronic ulcer of right ankle with fat l I83.013 Varicose veins of right lower  extremity with ulcer o Modifier: 25 breakdown o ayer exposed f ankle Quantity: 1 f skin Electronic Signature(s) Signed: 09/27/2014 4:59:10 PM By: Evlyn Kanner MD, FACS Entered By: Evlyn Kanner on 09/27/2014 16:57:18

## 2014-09-28 NOTE — Progress Notes (Addendum)
JAEDAN, SCHUMAN ([id number]) Visit Report for 09/27/2014 Arrival Information Details Patient Name: Joanna Roberts, Joanna Roberts. Date of Service: 09/27/2014 3:45 PM Medical Record Number: [id number] Patient Account Number: [id number] Date of Birth/Sex: 04-Feb-1929 (81 y.o. Female) Treating RN: Curtis Sites Primary Care Physician: Marisue Ivan Other Clinician: Referring Physician: Marisue Ivan Treating Physician/Extender: Rudene Re in Treatment: 17 Visit Information History Since Last Visit Added or deleted any medications: No Patient Arrived: Cane Any new allergies or adverse reactions: No Arrival Time: 16:24 Had a fall or experienced change in No Accompanied By: self activities of daily living that may affect Transfer Assistance: None risk of falls: Patient Identification Verified: Yes Signs or symptoms of abuse/neglect since last No Secondary Verification Process Yes visito Completed: Hospitalized since last visit: No Patient Requires Transmission- No Pain Present Now: No Based Precautions: Patient Has Alerts: Yes Patient Alerts: Patient on Blood Thinner 05/30/2014 ABI: (L) 1.11 (R) 1.05 Electronic Signature(s) Signed: 09/27/2014 5:16:47 PM By: Curtis Sites Entered By: Curtis Sites on 09/27/2014 16:26:01 Linan, Shamera Kathie Rhodes ([id number]) -------------------------------------------------------------------------------- Clinic Level of Care Assessment Details Patient Name: Joanna Quant. Date of Service: 09/27/2014 3:45 PM Medical Record Number: [id number] Patient Account Number: [id number] Date of Birth/Sex: 19-Apr-1929 (81 y.o. Female) Treating RN: Curtis Sites Primary Care Physician: Marisue Ivan Other Clinician: Referring Physician: Marisue Ivan Treating Physician/Extender: Rudene Re in Treatment: 17 Clinic Level of Care Assessment Items TOOL 4 Quantity Score []  - Use when only an EandM is performed on FOLLOW-UP visit 0 ASSESSMENTS - Nursing  Assessment / Reassessment X - Reassessment of Co-morbidities (includes updates in patient status) 1 10 X - Reassessment of Adherence to Treatment Plan 1 5 ASSESSMENTS - Wound and Skin Assessment / Reassessment X - Simple Wound Assessment / Reassessment - one wound 1 5 []  - Complex Wound Assessment / Reassessment - multiple wounds 0 []  - Dermatologic / Skin Assessment (not related to wound area) 0 ASSESSMENTS - Focused Assessment X - Circumferential Edema Measurements - multi extremities 1 5 []  - Nutritional Assessment / Counseling / Intervention 0 X - Lower Extremity Assessment (monofilament, tuning fork, pulses) 1 5 []  - Peripheral Arterial Disease Assessment (using hand held doppler) 0 ASSESSMENTS - Ostomy and/or Continence Assessment and Care []  - Incontinence Assessment and Management 0 []  - Ostomy Care Assessment and Management (repouching, etc.) 0 PROCESS - Coordination of Care X - Simple Patient / Family Education for ongoing care 1 15 []  - Complex (extensive) Patient / Family Education for ongoing care 0 []  - Staff obtains , Records, Test Results / Process Orders 0 []  - Staff telephones HHA, Nursing Homes / Clarify orders / etc 0 []  - Routine Transfer to another Facility (non-emergent condition) 0 Cahue, Milania S. ( ) []  - Routine Hospital Admission (non-emergent condition) 0 []  - New Admissions / / Ordering NPWT, Apligraf, etc. 0 []  - Emergency Hospital Admission (emergent condition) 0 X - Simple Discharge Coordination 1 10 []  - Complex (extensive) Discharge Coordination 0 PROCESS - Special Needs []  - Pediatric / Minor Patient Management 0 []  - Isolation Patient Management 0 []  - Hearing / Language / Visual special needs 0 []  - Assessment of Community assistance (transportation, D/C planning, etc.) 0 []  - Additional assistance / Altered mentation 0 []  - Support Surface(s) Assessment (bed, cushion, seat, etc.) 0 INTERVENTIONS - Wound  Cleansing / Measurement X - Simple Wound Cleansing - one wound 1 5 []  - Complex Wound Cleansing - multiple wounds 0 X - Wound Imaging (photographs - any  number of wounds) 1 5 []  - Wound Tracing (instead of photographs) 0 X - Simple Wound Measurement - one wound 1 5 []  - Complex Wound Measurement - multiple wounds 0 INTERVENTIONS - Wound Dressings X - Small Wound Dressing one or multiple wounds 1 10 []  - Medium Wound Dressing one or multiple wounds 0 []  - Large Wound Dressing one or multiple wounds 0 []  - Application of Medications - topical 0 []  - Application of Medications - injection 0 INTERVENTIONS - Miscellaneous []  - External ear exam 0 Sisney, Yina S. ([id number]) []  - Specimen Collection (cultures, biopsies, blood, body fluids, etc.) 0 []  - Specimen(s) / Culture(s) sent or taken to Lab for analysis 0 []  - Patient Transfer (multiple staff / Michiel Sites Lift / Similar devices) 0 []  - Simple Staple / Suture removal (25 or less) 0 []  - Complex Staple / Suture removal (26 or more) 0 []  - Hypo / Hyperglycemic Management (close monitor of Blood Glucose) 0 []  - Ankle / Brachial Index (ABI) - do not check if billed separately 0 X - Vital Signs 1 5 Has the patient been seen at the hospital within the last three years: Yes Total Score: 85 Level Of Care: New/Established - Level 3 Electronic Signature(s) Signed: 09/30/2014 10:44:40 AM By: Curtis Sites Entered By: Curtis Sites on 09/30/2014 10:44:39 Scorza, Emonii Kathie Rhodes ([id number]) -------------------------------------------------------------------------------- Encounter Discharge Information Details Patient Name: Joanna Quant. Date of Service: 09/27/2014 3:45 PM Medical Record Number: [id number] Patient Account Number: [id number] Date of Birth/Sex: [birth year] (81 y.o. Female) Treating RN: Primary Care Physician: Marisue Ivan Other Clinician: Referring Physician: Marisue Ivan Treating Physician/Extender: Rudene Re in  Treatment: 58 Encounter Discharge Information Items Discharge Pain Level: 0 Discharge Condition: Stable Ambulatory Status: Cane Discharge Destination: Home Transportation: Private Auto Accompanied By: self Schedule Follow-up Appointment: Yes Medication Reconciliation completed No and provided to Patient/Care Gavriela Cashin: Provided on Clinical Summary of Care: 09/27/2014 Form Type Recipient Paper Patient [facility] Electronic Signature(s) Signed: 09/27/2014 5:05:38 PM By: Curtis Sites Previous Signature: 09/27/2014 4:59:51 PM Version By: Gwenlyn Perking Entered By: Curtis Sites on 09/27/2014 17:05:37 Beazer, Chieko Kathie Rhodes ([id number]) -------------------------------------------------------------------------------- Lower Extremity Assessment Details Patient Name: Joanna Quant. Date of Service: 09/27/2014 3:45 PM Medical Record Number: [id number] Patient Account Number: [id number] Date of Birth/Sex: 1928/05/21 (81 y.o. Female) Treating RN: Curtis Sites Primary Care Physician: Marisue Ivan Other Clinician: Referring Physician: Marisue Ivan Treating Physician/Extender: Rudene Re in Treatment: 17 Edema Assessment Assessed: [Left: No] [Right: No] E[Left: dema] [Right: :] Calf Left: Right: Point of Measurement: 34 cm From Medial Instep cm 34.5 cm Ankle Left: Right: Point of Measurement: 11 cm From Medial Instep cm 21.9 cm Vascular Assessment Pulses: Posterior Tibial Palpable: [Right:Yes] Dorsalis Pedis Palpable: [Right:Yes] Extremity colors, hair growth, and conditions: Extremity Color: [Right:Normal] Hair Growth on Extremity: [Right:No] Temperature of Extremity: [Right:Warm] Capillary Refill: [Right:< 3 seconds] Toe Nail Assessment Left: Right: Thick: No Discolored: No Deformed: No Improper Length and Hygiene: No Electronic Signature(s) Signed: 09/27/2014 5:16:47 PM By: Curtis Sites Entered By: Curtis Sites on 09/27/2014 16:33:41 Geary, Katlyn S.  ([id number]) Kaestner, Zyniah S. ([id number]) -------------------------------------------------------------------------------- Multi Wound Chart Details Patient Name: Joanna Quant. Date of Service: 09/27/2014 3:45 PM Medical Record Number: [id number] Patient Account Number: [id number] Date of Birth/Sex: Jul 18, 1928 (81 y.o. 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] Comorbid History: [1:Cataracts, Congestive Heart Failure, Deep Vein Thrombosis, Hypertension, Myocardial Infarction, Rheumatoid Arthritis, Neuropathy] [N/A:N/A] Date Acquired: [1:12/25/2013] [N/A:N/A] Weeks of Treatment: [1:17] [N/A:N/A] Wound Status: [1:Open] [N/A:N/A] Measurements L x W x D 1.5x0.3x0.3 [N/A:N/A] (cm) Area (cm) : [1:0.353] [N/A:N/A] Volume (cm) : [1:0.106] [N/A:N/A] % Reduction in Area: [1:-124.80%] [N/A:N/A] % Reduction in Volume: -125.50% [N/A:N/A] Classification: [1:Full Thickness Without Exposed Support Structures] [N/A:N/A] Exudate Amount: [1:Small] [N/A:N/A] Exudate Type: [1:Serous] [N/A:N/A] Exudate Color: [1:amber] [N/A:N/A] Wound Margin: [1:Flat and Intact] [N/A:N/A] Granulation Amount: [1:Small (1-33%)] [N/A:N/A] Granulation Quality: [1:Red] [N/A:N/A] Necrotic Amount: [1:Large (67-100%)] [N/A:N/A] Exposed Structures: [N/A:N/A] Fascia: No Fat: No Tendon: No Muscle: No Joint: No Bone: No Limited to Skin Breakdown Epithelialization: Small (1-33%) N/A N/A Periwound Skin Texture:  Edema: No N/A N/A Excoriation: No Induration: No Callus: No Crepitus: No Fluctuance: No Friable: No Rash: No Scarring: No Periwound Skin Moist: Yes N/A N/A Moisture: Maceration: No Dry/Scaly: No Periwound Skin Color: Atrophie Blanche: No N/A N/A Cyanosis: No Ecchymosis: No Erythema: No Hemosiderin Staining: No Mottled: No Pallor: No Rubor: No Temperature: No Abnormality N/A N/A Tenderness on Yes N/A N/A Palpation: Wound Preparation: Ulcer Cleansing: N/A N/A Rinsed/Irrigated with Saline Topical Anesthetic Applied: Other: Lidocaine 4% Ointment Treatment Notes Electronic Signature(s) Signed: 09/27/2014 5:11:17 PM By: Elliot Gurney, RN, BSN, Kim RN, BSN Entered By: Elliot Gurney, RN, BSN, Kim on 09/27/2014 16:41:04 MATILDE, POTTENGER ([id number]) -------------------------------------------------------------------------------- Multi-Disciplinary Care Plan Details Patient Name: KOLBIE, CLARKSTON. Date of Service: 09/27/2014 3:45 PM Medical Record Number: [id number] Patient Account Number: [id number] Date of Birth/Sex: [birth year] (81 y.o. Female) Treating RN: Huel Coventry Primary Care Physician: Marisue Ivan Other Clinician: Referring Physician: Marisue Ivan Treating Physician/Extender: Rudene Re in Treatment: 99 Active Inactive Electronic Signature(s) Signed: 12/05/2014 8:29:00 AM By: Elliot Gurney RN, BSN, Kim RN, BSN Previous Signature: 09/27/2014 5:11:17 PM Version By: Elliot Gurney, RN, BSN, Kim RN, BSN Entered By: Elliot Gurney, RN, BSN, Kim on 10/14/2014 12:16:29 XENA, PROPST ([id number]) -------------------------------------------------------------------------------- Patient/Caregiver Education Details Patient Name: KLAIR, LEISING Date of Service: 09/27/2014 3:45 PM Medical Record Number: [id number] Patient Account Number: [id number] Date of Birth/Gender: [birth year] (81 y.o. Female) Treating RN: Curtis Sites Primary Care Physician: Marisue Ivan Other Clinician: Referring Physician:  Marisue Ivan Treating Physician/Extender: Rudene Re in Treatment: 17 Education Assessment Education Provided To: Patient Education Topics Provided Wound/Skin Impairment: Handouts: Caring for Your Ulcer Methods: Demonstration, Explain/Verbal Responses: State content correctly Electronic Signature(s) Signed: 09/27/2014 5:05:49 PM By: Curtis Sites Entered By: Curtis Sites on 09/27/2014 17:05:48 Delafuente, Aviana Kathie Rhodes ([id number]) -------------------------------------------------------------------------------- Wound Assessment Details Patient Name: Joanna Quant. Date of Service: 09/27/2014 3:45 PM Medical Record Number: [id number] Patient Account Number: [id number] Date of Birth/Sex: 10-19-1928 (81 y.o. Female) Treating RN: Curtis Sites Primary Care Physician: Marisue Ivan Other Clinician: Referring Physician: Marisue Ivan Treating Physician/Extender: Rudene Re in Treatment: 17 Wound Status Wound Number: 1 Primary Arterial Insufficiency Ulcer Etiology: Wound Location: Right Malleolus - Lateral Wound Open Wounding Event: Gradually Appeared Status: Date Acquired: 12/25/2013 Comorbid Cataracts, Congestive Heart Failure, Weeks Of Treatment: 17 History: Deep Vein Thrombosis, Hypertension, Clustered Wound: No Myocardial Infarction, Rheumatoid Arthritis, Neuropathy Photos Photo Uploaded By: Curtis Sites on 09/27/2014 17:06:47 Wound Measurements Length: (cm) 1.5 Width: (cm) 0.3 Depth: (cm) 0.3 Area: (cm) 0.353 Volume: (cm) 0.106 % Reduction in Area: -124.8% % Reduction in Volume: -125.5% Epithelialization: Small (1-33%) Tunneling: No  Undermining: No Wound Description Full Thickness Without Exposed Foul Odor Af Classification: Support Structures Wound Margin: Flat and Intact Exudate Small Amount: Exudate Type: Serous Exudate Color: amber ter Cleansing: No Wound Bed Granulation Amount: Small (1-33%) Exposed Structure Balash, Londyn  S. ([id number]) Granulation Quality: Red Fascia Exposed: No Necrotic Amount: Large (67-100%) Fat Layer Exposed: No Necrotic Quality: Adherent Slough Tendon Exposed: No Muscle Exposed: No Joint Exposed: No Bone Exposed: No Limited to Skin Breakdown Periwound Skin Texture Texture Color No Abnormalities Noted: No No Abnormalities Noted: No Callus: No Atrophie Blanche: No Crepitus: No Cyanosis: No Excoriation: No Ecchymosis: No Fluctuance: No Erythema: No Friable: No Hemosiderin Staining: No Induration: No Mottled: No Localized Edema: No Pallor: No Rash: No Rubor: No Scarring: No Temperature / Pain Moisture Temperature: No Abnormality No Abnormalities Noted: No Tenderness on Palpation: Yes Dry / Scaly: No Maceration: No Moist: Yes Wound Preparation Ulcer Cleansing: Rinsed/Irrigated with Saline Topical Anesthetic Applied: Other: Lidocaine 4% Ointment, Electronic Signature(s) Signed: 09/27/2014 5:16:47 PM By: Curtis Sites Entered By: Curtis Sites on 09/27/2014 16:35:04 Chaffin, Henriette Combs ([id number]) -------------------------------------------------------------------------------- Vitals Details Patient Name: Joanna Quant. Date of Service: 09/27/2014 3:45 PM Medical Record Number: [id number] Patient Account Number: [id number] Date of Birth/Sex: May 01, 1928 (81 y.o. Female) Treating RN: Curtis Sites Primary Care Physician: Marisue Ivan Other Clinician: Referring Physician: Marisue Ivan Treating Physician/Extender: Rudene Re in Treatment: 17 Vital Signs Time Taken: 16:26 Temperature (F): 98.2 Height (in): 60 Pulse (bpm): 75 Weight (lbs): 141.5 Respiratory Rate (breaths/min): 18 Body Mass Index (BMI): 27.6 Blood Pressure (mmHg): 129/65 Reference Range: 80 - 120 mg / dl Electronic Signature(s) Signed: 09/27/2014 5:16:47 PM By: Curtis Sites Entered By: Curtis Sites on 09/27/2014 16:26:45

## 2014-10-03 ENCOUNTER — Ambulatory Visit: Payer: PPO | Admitting: Surgery

## 2014-10-08 ENCOUNTER — Ambulatory Visit
Admission: RE | Admit: 2014-10-08 | Discharge: 2014-10-08 | Disposition: A | Discharge status: Home / Self Care | Payer: PPO | Source: Ambulatory Visit | Attending: Vascular Surgery | Admitting: Vascular Surgery

## 2014-10-08 ENCOUNTER — Encounter: Payer: Self-pay | Admitting: *Deleted

## 2014-10-08 ENCOUNTER — Encounter
Admission: RE | Disposition: A | Discharge status: Home / Self Care | Payer: Self-pay | Source: Ambulatory Visit | Attending: Vascular Surgery

## 2014-10-08 ENCOUNTER — Ambulatory Visit: Admit: 2014-10-08 | Payer: Self-pay | Admitting: Vascular Surgery

## 2014-10-08 DIAGNOSIS — I10 Essential (primary) hypertension: Secondary | ICD-10-CM | POA: Diagnosis not present

## 2014-10-08 DIAGNOSIS — I509 Heart failure, unspecified: Secondary | ICD-10-CM | POA: Insufficient documentation

## 2014-10-08 DIAGNOSIS — I499 Cardiac arrhythmia, unspecified: Secondary | ICD-10-CM | POA: Insufficient documentation

## 2014-10-08 DIAGNOSIS — I252 Old myocardial infarction: Secondary | ICD-10-CM | POA: Diagnosis not present

## 2014-10-08 DIAGNOSIS — I70233 Atherosclerosis of native arteries of right leg with ulceration of ankle: Secondary | ICD-10-CM | POA: Diagnosis not present

## 2014-10-08 DIAGNOSIS — E039 Hypothyroidism, unspecified: Secondary | ICD-10-CM | POA: Insufficient documentation

## 2014-10-08 HISTORY — DX: Heart failure, unspecified: I50.9

## 2014-10-08 HISTORY — PX: PERIPHERAL VASCULAR CATHETERIZATION: SHX172C

## 2014-10-08 HISTORY — DX: Acute myocardial infarction, unspecified: I21.9

## 2014-10-08 HISTORY — DX: Essential (primary) hypertension: I10

## 2014-10-08 HISTORY — DX: Cardiac arrhythmia, unspecified: I49.9

## 2014-10-08 HISTORY — DX: Presence of automatic (implantable) cardiac defibrillator: Z95.810

## 2014-10-08 HISTORY — DX: Presence of cardiac pacemaker: Z95.0

## 2014-10-08 LAB — PROTIME-INR
INR: 1.21
Prothrombin Time: 15.5 seconds — ABNORMAL HIGH (ref 11.4–15.0)

## 2014-10-08 LAB — CREATININE, SERUM
Creatinine, Ser: 1.06 mg/dL — ABNORMAL HIGH (ref 0.44–1.00)
GFR calc Af Amer: 55 mL/min — ABNORMAL LOW (ref >60)
GFR, EST NON AFRICAN AMERICAN: 47 mL/min — AB (ref >60)

## 2014-10-08 LAB — BUN: BUN: 24 mg/dL — ABNORMAL HIGH (ref 6–20)

## 2014-10-08 SURGERY — LOWER EXTREMITY ANGIOGRAPHY
Anesthesia: Moderate Sedation | Laterality: Right

## 2014-10-08 MED ORDER — SODIUM CHLORIDE 0.9 % IJ SOLN
INTRAMUSCULAR | Status: AC
  Filled 2014-10-08: qty 15

## 2014-10-08 MED ORDER — HEPARIN (PORCINE) IN NACL 2-0.9 UNIT/ML-% IJ SOLN
INTRAMUSCULAR | Status: AC
  Filled 2014-10-08: qty 1000

## 2014-10-08 MED ORDER — LIDOCAINE HCL 1 % IJ SOLN
INTRAMUSCULAR | Status: DC | PRN
  Administered 2014-10-08: 10 mL via INTRADERMAL

## 2014-10-08 MED ORDER — IOHEXOL 300 MG/ML  SOLN
INTRAMUSCULAR | Status: DC | PRN
  Administered 2014-10-08: 50 mL via INTRAVENOUS

## 2014-10-08 MED ORDER — HYDROMORPHONE HCL 1 MG/ML IJ SOLN
INTRAMUSCULAR | Status: AC
  Administered 2014-10-08: 0.5 mg via INTRAVENOUS
  Filled 2014-10-08: qty 1

## 2014-10-08 MED ORDER — LIDOCAINE-EPINEPHRINE (PF) 1 %-1:200000 IJ SOLN
INTRAMUSCULAR | Status: AC
  Filled 2014-10-08: qty 30

## 2014-10-08 MED ORDER — FENTANYL CITRATE (PF) 100 MCG/2ML IJ SOLN
INTRAMUSCULAR | Status: AC
  Filled 2014-10-08: qty 2

## 2014-10-08 MED ORDER — MIDAZOLAM HCL 5 MG/5ML IJ SOLN
INTRAMUSCULAR | Status: AC
  Filled 2014-10-08: qty 5

## 2014-10-08 MED ORDER — FENTANYL CITRATE (PF) 100 MCG/2ML IJ SOLN
INTRAMUSCULAR | Status: AC
Start: 2014-10-08 — End: 2014-10-08
  Filled 2014-10-08: qty 2

## 2014-10-08 MED ORDER — CEFAZOLIN SODIUM 1-5 GM-% IV SOLN
INTRAVENOUS | Status: AC
  Filled 2014-10-08: qty 50

## 2014-10-08 MED ORDER — LIDOCAINE HCL (PF) 1 % IJ SOLN
INTRAMUSCULAR | Status: AC
  Filled 2014-10-08: qty 10

## 2014-10-08 MED ORDER — HEPARIN SODIUM (PORCINE) 1000 UNIT/ML IJ SOLN
INTRAMUSCULAR | Status: AC
  Filled 2014-10-08: qty 1

## 2014-10-08 MED ORDER — SODIUM BICARBONATE BOLUS VIA INFUSION
INTRAVENOUS | Status: AC
  Administered 2014-10-08: 09:00:00 via INTRAVENOUS
  Filled 2014-10-08: qty 1

## 2014-10-08 MED ORDER — MIDAZOLAM HCL 2 MG/2ML IJ SOLN
INTRAMUSCULAR | Status: DC | PRN
  Administered 2014-10-08 (×2): 1 mg via INTRAVENOUS
  Administered 2014-10-08: 2 mg via INTRAVENOUS

## 2014-10-08 MED ORDER — SODIUM CHLORIDE 0.9 % IV SOLN
INTRAVENOUS | Status: DC
  Administered 2014-10-08: 09:00:00 via INTRAVENOUS

## 2014-10-08 MED ORDER — FENTANYL CITRATE (PF) 100 MCG/2ML IJ SOLN
INTRAMUSCULAR | Status: DC | PRN
  Administered 2014-10-08 (×2): 25 ug via INTRAVENOUS
  Administered 2014-10-08: 50 ug via INTRAVENOUS

## 2014-10-08 MED ORDER — SODIUM BICARBONATE 8.4 % IV SOLN
INTRAVENOUS | Status: AC
  Administered 2014-10-08: 10:00:00 via INTRAVENOUS
  Filled 2014-10-08: qty 1000

## 2014-10-08 MED ORDER — HEPARIN SODIUM (PORCINE) 1000 UNIT/ML IJ SOLN
INTRAMUSCULAR | Status: DC | PRN
  Administered 2014-10-08: 5000 [IU] via INTRAVENOUS

## 2014-10-08 MED ORDER — CEFAZOLIN SODIUM 1-5 GM-% IV SOLN
1.0000 g | Freq: Once | INTRAVENOUS | Status: AC
  Administered 2014-10-08: 1 g via INTRAVENOUS

## 2014-10-08 SURGICAL SUPPLY — 18 items
BALLN LUTONIX DCB 4X40X130 (BALLOONS) ×4
BALLN LUTONIX DCB 5X100X130 (BALLOONS) ×4
BALLOON LUTONIX DCB 4X40X130 (BALLOONS) ×2 IMPLANT
BALLOON LUTONIX DCB 5X100X130 (BALLOONS) ×2 IMPLANT
CATH 5FX125 BOWTIP (CATHETERS) ×4 IMPLANT
CATH RIM 5FR (CATHETERS) ×4 IMPLANT
CATH ROYAL FLUSH PIG 5F 70CM (CATHETERS) ×4 IMPLANT
DEVICE PRESTO INFLATION (MISCELLANEOUS) ×4 IMPLANT
DEVICE STARCLOSE SE CLOSURE (Vascular Products) ×2 IMPLANT
GLIDEWIRE STIFF .35X180X3 HYDR (WIRE) ×4 IMPLANT
PACK ANGIOGRAPHY (CUSTOM PROCEDURE TRAY) ×4 IMPLANT
SET INTRO CAPELLA COAXIAL (SET/KITS/TRAYS/PACK) ×4 IMPLANT
SHEATH BRITE TIP 5FRX11 (SHEATH) ×4 IMPLANT
SHEATH RAABE 6FR (SHEATH) ×4 IMPLANT
SYR MEDRAD MARK V 150ML (SYRINGE) ×4 IMPLANT
TUBING CONTRAST HIGH PRESS 72 (TUBING) ×4 IMPLANT
WIRE J 3MM .035X145CM (WIRE) ×4 IMPLANT
WIRE MAGIC TORQUE 260C (WIRE) ×4 IMPLANT

## 2014-10-08 NOTE — OR Nursing (Signed)
PT unable to void, abdomen appears distended. Order obtain for in & out cath, LFA site oozing.slightly. OBtained 450cc urine, abdomen now soft, PAD removed and site checked, small ooze, pressure held X 5 min. , guaze & tegaderm applied. Site soft without hematoma

## 2014-10-08 NOTE — H&P (Signed)
[facility] History & Physical Update  The patient was interviewed and re-examined.  The patient's previous History and Physical has been reviewed and is unchanged.  There is no change in the plan of care.  Danelle Curiale, Latina Craver, MD  10/08/2014, 10:08 AM

## 2014-10-08 NOTE — Discharge Instructions (Signed)
Groin Insertion Instructions-If you lose feeling or develop tingling or pain in your leg or foot after the procedure, please walk around first.  If the discomfort does not improve , contact your physician and proceed to the nearest emergency room.  Loss of feeling in your leg might mean that a blockage has formed in the artery and this can be appropriately treated.  Limit your activity for the next two days after your procedure.  Avoid stooping, bending, heavy lifting or exertion as this may put pressure on the insertion site.  Resume normal activities in 48 hours.  You may shower after 24 hours but avoid excessive warm water and do not scrub the site.  Remove clear dressing in 48 hours.  If you have had a closure device inserted, do not soak in a tub bath or a hot tub for at least one week. ° °No driving for 48 hours after discharge.  After the procedure, check the insertion site occasionally.  If any oozing occurs or there is apparent swelling, firm pressure over the site will prevent a bruise from forming.  You can not hurt anything by pressing directly on the site.  The pressure stops the bleeding by allowing a small clot to form.  If the bleeding continues after the pressure has been applied for more than 15 minutes, call 911 or go to the nearest emergency room.   ° °The x-ray dye causes you to pass a considerate amount of urine.  For this reason, you will be asked to drink plenty of liquids after the procedure to prevent dehydration.  You may resume you regular diet.  Avoid caffeine products.   ° °For pain at the site of your procedure, take non-aspirin medicines such as Tylenol. ° °Medications: A. Hold Metformin for 48 hours if applicable.  B. Continue taking all your present medications at home unless your doctor prescribes any changes.Angiogram, Care After °Refer to this sheet in the next few weeks. These instructions provide you with information on caring for yourself after your procedure. Your health care  provider may also give you more specific instructions. Your treatment has been planned according to current medical practices, but problems sometimes occur. Call your health care provider if you have any problems or questions after your procedure.  °WHAT TO EXPECT AFTER THE PROCEDURE °After your procedure, it is typical to have the following sensations: °· Minor discomfort or tenderness and a small bump at the catheter insertion site. The bump should usually decrease in size and tenderness within 1 to 2 weeks. °· Any bruising will usually fade within 2 to 4 weeks. °HOME CARE INSTRUCTIONS  °· You may need to keep taking blood thinners if they were prescribed for you. Take medicines only as directed by your health care provider. °· Do not apply powder or lotion to the site. °· Do not take baths, swim, or use a hot tub until your health care provider approves. °· You may shower 24 hours after the procedure. Remove the bandage (dressing) and gently wash the site with plain soap and water. Gently pat the site dry. °· Inspect the site at least twice daily. °· Limit your activity for the first 48 hours. Do not bend, squat, or lift anything over 20 lb (9 kg) or as directed by your health care provider. °· Plan to have someone take you home after the procedure. Follow instructions about when you can drive or return to work. °SEEK MEDICAL CARE IF: °· You get light-headed when   standing up. °· You have drainage (other than a small amount of blood on the dressing). °· You have chills. °· You have a fever. °· You have redness, warmth, swelling, or pain at the insertion site. °SEEK IMMEDIATE MEDICAL CARE IF:  °· You develop chest pain or shortness of breath, feel faint, or pass out. °· You have bleeding, swelling larger than a walnut, or drainage from the catheter insertion site. °· You develop pain, discoloration, coldness, or severe bruising in the leg or arm that held the catheter. °· You develop bleeding from any other place,  such as the bowels. You may see bright red blood in your urine or stools, or your stools may appear black and tarry. °· You have heavy bleeding from the site. If this happens, hold pressure on the site. °MAKE SURE YOU: °· Understand these instructions. °· Will watch your condition. °· Will get help right away if you are not doing well or get worse. °Document Released: 10/29/2004 Document Revised: 08/27/2013 Document Reviewed: 09/04/2012 °Exit[employer]® Patient Information ©2015 [employer]. This information is not intended to replace advice given to you by your health care provider. Make sure you discuss any questions you have with your health care provider. ° °

## 2014-10-09 ENCOUNTER — Encounter: Payer: Self-pay | Admitting: Vascular Surgery

## 2014-11-18 ENCOUNTER — Other Ambulatory Visit: Payer: Self-pay | Admitting: Vascular Surgery

## 2014-11-18 DIAGNOSIS — M869 Osteomyelitis, unspecified: Secondary | ICD-10-CM

## 2014-11-25 ENCOUNTER — Encounter
Admission: RE | Admit: 2014-11-25 | Discharge: 2014-11-25 | Disposition: A | Discharge status: Home / Self Care | Payer: PPO | Source: Ambulatory Visit | Attending: Vascular Surgery | Admitting: Vascular Surgery

## 2014-11-25 DIAGNOSIS — M869 Osteomyelitis, unspecified: Secondary | ICD-10-CM | POA: Insufficient documentation

## 2014-11-25 MED ORDER — TECHNETIUM TC 99M MEDRONATE IV KIT
25.0000 | PACK | Freq: Once | INTRAVENOUS | Status: AC | PRN
  Administered 2014-11-25: 23.61 via INTRAVENOUS

## 2014-11-26 DIAGNOSIS — D649 Anemia, unspecified: Secondary | ICD-10-CM | POA: Insufficient documentation

## 2014-11-27 DIAGNOSIS — E538 Deficiency of other specified B group vitamins: Secondary | ICD-10-CM | POA: Insufficient documentation

## 2014-12-19 ENCOUNTER — Ambulatory Visit: Payer: PPO

## 2014-12-19 DIAGNOSIS — N390 Urinary tract infection, site not specified: Secondary | ICD-10-CM

## 2014-12-19 LAB — URINALYSIS, COMPLETE
BILIRUBIN UA: NEGATIVE
Glucose, UA: NEGATIVE
Ketones, UA: NEGATIVE
NITRITE UA: NEGATIVE
PH UA: 5.5 (ref 5.0–7.5)
Protein, UA: NEGATIVE
SPEC GRAV UA: 1.02 (ref 1.005–1.030)
Urobilinogen, Ur: 0.2 mg/dL (ref 0.2–1.0)

## 2014-12-19 LAB — MICROSCOPIC EXAMINATION
BACTERIA UA: NONE SEEN
RBC, UA: NONE SEEN /hpf (ref 0–?)

## 2014-12-19 NOTE — Progress Notes (Signed)
Pt came in c/o of chills and thinking she has a UTI. Per [name] pt was able to give a urine for u/a and cx.

## 2014-12-22 LAB — CULTURE, URINE COMPREHENSIVE

## 2014-12-23 ENCOUNTER — Telehealth: Payer: Self-pay

## 2014-12-23 NOTE — Telephone Encounter (Signed)
LMOM

## 2014-12-23 NOTE — Telephone Encounter (Signed)
-----   Message from Shannon A McGowan, PA-C sent at 12/22/2014 10:14 PM EDT ----- Urine culture is negative.  If she is still having symptoms, she will need an office visit. 

## 2014-12-23 NOTE — Telephone Encounter (Signed)
-----   Message from Harle Battiest, PA-C sent at 12/22/2014 10:14 PM EDT ----- Urine culture is negative.  If she is still having symptoms, she will need an office visit.

## 2014-12-23 NOTE — Telephone Encounter (Signed)
Pt returned called in reference to ucx. Nurse made pt aware of negative ucx. Pt voiced understanding. Pt stated she is continuing to have chills but feels it maybe something else. Nurse advised pt to make a f/u appt with [facility]. Pt stated she would have to call back later due having another procedure tomorrow.

## 2015-02-06 ENCOUNTER — Telehealth: Payer: Self-pay | Admitting: *Deleted

## 2015-02-06 NOTE — Telephone Encounter (Signed)
Patient calling about making an appointment Joanna Roberts to place a pessary. I let patient know that we don't do that and we would refer out to GYN. Patient states [name] has try to place several pessaries and they would not stay in. I let her know that [name] [place] and that when she returns I would talk to her about it and give her a call back with information about what to do. Patient ok with plan.

## 2015-02-12 ENCOUNTER — Telehealth: Payer: Self-pay | Admitting: *Deleted

## 2015-02-12 NOTE — Telephone Encounter (Signed)
LMOM in regards to follow up from patient call when Joanna Roberts was [place] the country. Et patient know that we do not fit for a pessary and Joanna Roberts may have taken one out of her and cleaned it and put it back but we don't have the equipment to fit for these that she needs to go to GYN. If any questions call office back.

## 2015-03-11 ENCOUNTER — Encounter: Payer: Self-pay | Admitting: Emergency Medicine

## 2015-03-11 ENCOUNTER — Inpatient Hospital Stay
Admission: EM | Admit: 2015-03-11 | Discharge: 2015-03-18 | DRG: 871 | Disposition: A | Discharge status: Home / Self Care | Payer: PPO | Attending: Internal Medicine | Admitting: Internal Medicine

## 2015-03-11 ENCOUNTER — Emergency Department: Payer: PPO

## 2015-03-11 DIAGNOSIS — I482 Chronic atrial fibrillation, unspecified: Secondary | ICD-10-CM | POA: Diagnosis present

## 2015-03-11 DIAGNOSIS — I420 Dilated cardiomyopathy: Secondary | ICD-10-CM | POA: Diagnosis present

## 2015-03-11 DIAGNOSIS — Z79891 Long term (current) use of opiate analgesic: Secondary | ICD-10-CM

## 2015-03-11 DIAGNOSIS — R1310 Dysphagia, unspecified: Secondary | ICD-10-CM | POA: Diagnosis present

## 2015-03-11 DIAGNOSIS — I5022 Chronic systolic (congestive) heart failure: Secondary | ICD-10-CM | POA: Diagnosis present

## 2015-03-11 DIAGNOSIS — R079 Chest pain, unspecified: Secondary | ICD-10-CM

## 2015-03-11 DIAGNOSIS — Z885 Allergy status to narcotic agent status: Secondary | ICD-10-CM | POA: Diagnosis not present

## 2015-03-11 DIAGNOSIS — D649 Anemia, unspecified: Secondary | ICD-10-CM | POA: Diagnosis present

## 2015-03-11 DIAGNOSIS — I110 Hypertensive heart disease with heart failure: Secondary | ICD-10-CM | POA: Diagnosis present

## 2015-03-11 DIAGNOSIS — Z950 Presence of cardiac pacemaker: Secondary | ICD-10-CM | POA: Diagnosis not present

## 2015-03-11 DIAGNOSIS — A419 Sepsis, unspecified organism: Secondary | ICD-10-CM | POA: Diagnosis present

## 2015-03-11 DIAGNOSIS — E871 Hypo-osmolality and hyponatremia: Secondary | ICD-10-CM | POA: Diagnosis present

## 2015-03-11 DIAGNOSIS — I252 Old myocardial infarction: Secondary | ICD-10-CM | POA: Diagnosis not present

## 2015-03-11 DIAGNOSIS — Z7901 Long term (current) use of anticoagulants: Secondary | ICD-10-CM

## 2015-03-11 DIAGNOSIS — J189 Pneumonia, unspecified organism: Secondary | ICD-10-CM | POA: Diagnosis present

## 2015-03-11 DIAGNOSIS — Z79899 Other long term (current) drug therapy: Secondary | ICD-10-CM | POA: Diagnosis not present

## 2015-03-11 DIAGNOSIS — N179 Acute kidney failure, unspecified: Secondary | ICD-10-CM | POA: Diagnosis present

## 2015-03-11 DIAGNOSIS — Z95810 Presence of automatic (implantable) cardiac defibrillator: Secondary | ICD-10-CM | POA: Diagnosis not present

## 2015-03-11 DIAGNOSIS — Z881 Allergy status to other antibiotic agents status: Secondary | ICD-10-CM | POA: Diagnosis not present

## 2015-03-11 LAB — BASIC METABOLIC PANEL
ANION GAP: 6 (ref 5–15)
BUN: 26 mg/dL — AB (ref 6–20)
CO2: 24 mmol/L (ref 22–32)
Calcium: 8.9 mg/dL (ref 8.9–10.3)
Chloride: 96 mmol/L — ABNORMAL LOW (ref 101–111)
Creatinine, Ser: 1.17 mg/dL — ABNORMAL HIGH (ref 0.44–1.00)
GFR calc Af Amer: 48 mL/min — ABNORMAL LOW (ref >60)
GFR calc non Af Amer: 42 mL/min — ABNORMAL LOW (ref >60)
GLUCOSE: 179 mg/dL — AB (ref 65–99)
POTASSIUM: 4.5 mmol/L (ref 3.5–5.1)
Sodium: 126 mmol/L — ABNORMAL LOW (ref 135–145)

## 2015-03-11 LAB — PROTIME-INR
INR: 4.98
Prothrombin Time: 44.8 seconds — ABNORMAL HIGH (ref 11.4–15.0)

## 2015-03-11 LAB — URINALYSIS COMPLETE WITH MICROSCOPIC (ARMC ONLY)
Bilirubin Urine: NEGATIVE
Glucose, UA: NEGATIVE mg/dL
HGB URINE DIPSTICK: NEGATIVE
Ketones, ur: NEGATIVE mg/dL
Nitrite: NEGATIVE
PROTEIN: 30 mg/dL — AB
SPECIFIC GRAVITY, URINE: 1.016 (ref 1.005–1.030)
pH: 5 (ref 5.0–8.0)

## 2015-03-11 LAB — CBC
HCT: 28.8 % — ABNORMAL LOW (ref 35.0–47.0)
HEMATOCRIT: 30.9 % — AB (ref 35.0–47.0)
HEMOGLOBIN: 9.8 g/dL — AB (ref 12.0–16.0)
Hemoglobin: 9.4 g/dL — ABNORMAL LOW (ref 12.0–16.0)
MCH: 29.5 pg (ref 26.0–34.0)
MCH: 30.3 pg (ref 26.0–34.0)
MCHC: 31.7 g/dL — ABNORMAL LOW (ref 32.0–36.0)
MCHC: 32.6 g/dL (ref 32.0–36.0)
MCV: 92.9 fL (ref 80.0–100.0)
MCV: 92.8 fL (ref 80.0–100.0)
PLATELETS: 138 10*3/uL — AB (ref 150–440)
Platelets: 152 10*3/uL (ref 150–440)
RBC: 3.33 MIL/uL — ABNORMAL LOW (ref 3.80–5.20)
RBC: 3.11 MIL/uL — AB (ref 3.80–5.20)
RDW: 14.6 % — ABNORMAL HIGH (ref 11.5–14.5)
RDW: 14.5 % (ref 11.5–14.5)
WBC: 11.1 10*3/uL — ABNORMAL HIGH (ref 3.6–11.0)
WBC: 10.5 10*3/uL (ref 3.6–11.0)

## 2015-03-11 LAB — CREATININE, SERUM
CREATININE: 0.98 mg/dL (ref 0.44–1.00)
GFR calc Af Amer: 60 mL/min — ABNORMAL LOW (ref >60)
GFR calc non Af Amer: 52 mL/min — ABNORMAL LOW (ref >60)

## 2015-03-11 MED ORDER — SODIUM CHLORIDE 0.9 % IV SOLN
Freq: Once | INTRAVENOUS | Status: AC
  Administered 2015-03-11: 16:00:00 via INTRAVENOUS

## 2015-03-11 MED ORDER — DOCUSATE SODIUM 100 MG PO CAPS
100.0000 mg | ORAL_CAPSULE | Freq: Every evening | ORAL | Status: DC | PRN
  Administered 2015-03-11 – 2015-03-15 (×3): 100 mg via ORAL
  Filled 2015-03-11: qty 1

## 2015-03-11 MED ORDER — VENLAFAXINE HCL ER 37.5 MG PO CP24
37.5000 mg | ORAL_CAPSULE | Freq: Every day | ORAL | Status: DC
  Administered 2015-03-12 – 2015-03-18 (×7): 37.5 mg via ORAL
  Filled 2015-03-11 (×10): qty 1

## 2015-03-11 MED ORDER — ONDANSETRON HCL 4 MG PO TABS: 4.0000 mg | ORAL_TABLET | Freq: Four times a day (QID) | ORAL | Status: DC | PRN

## 2015-03-11 MED ORDER — ZOLPIDEM TARTRATE 5 MG PO TABS: 5.0000 mg | ORAL_TABLET | Freq: Every evening | ORAL | Status: DC | PRN

## 2015-03-11 MED ORDER — LEVOFLOXACIN IN D5W 750 MG/150ML IV SOLN
750.0000 mg | INTRAVENOUS | Status: DC
  Administered 2015-03-13: 750 mg via INTRAVENOUS
  Filled 2015-03-11: qty 150

## 2015-03-11 MED ORDER — SODIUM CHLORIDE 0.9 % IJ SOLN
3.0000 mL | Freq: Two times a day (BID) | INTRAMUSCULAR | Status: DC
Start: 2015-03-11 — End: 2015-03-18
  Administered 2015-03-12 – 2015-03-18 (×10): 3 mL via INTRAVENOUS

## 2015-03-11 MED ORDER — TEMAZEPAM 15 MG PO CAPS
15.0000 mg | ORAL_CAPSULE | Freq: Every evening | ORAL | Status: DC | PRN
  Administered 2015-03-11 – 2015-03-17 (×6): 15 mg via ORAL
  Filled 2015-03-11 (×6): qty 1

## 2015-03-11 MED ORDER — ONDANSETRON HCL 4 MG/2ML IJ SOLN
4.0000 mg | Freq: Four times a day (QID) | INTRAMUSCULAR | Status: DC | PRN
  Administered 2015-03-11 – 2015-03-12 (×2): 4 mg via INTRAVENOUS
  Filled 2015-03-11 (×2): qty 2

## 2015-03-11 MED ORDER — SODIUM CHLORIDE 0.9 % IV SOLN
INTRAVENOUS | Status: AC
  Administered 2015-03-11: 19:00:00 via INTRAVENOUS

## 2015-03-11 MED ORDER — OXYCODONE-ACETAMINOPHEN 5-325 MG PO TABS
1.0000 | ORAL_TABLET | ORAL | Status: DC | PRN
  Administered 2015-03-12 – 2015-03-15 (×6): 1 via ORAL
  Filled 2015-03-11 (×6): qty 1

## 2015-03-11 MED ORDER — TEMAZEPAM 15 MG PO CAPS: 15.0000 mg | ORAL_CAPSULE | Freq: Every evening | ORAL | Status: DC | PRN

## 2015-03-11 MED ORDER — ENOXAPARIN SODIUM 30 MG/0.3ML ~~LOC~~ SOLN: 30.0000 mg | SUBCUTANEOUS | Status: DC

## 2015-03-11 MED ORDER — CARVEDILOL 25 MG PO TABS
25.0000 mg | ORAL_TABLET | Freq: Two times a day (BID) | ORAL | Status: DC
  Administered 2015-03-11 – 2015-03-18 (×14): 25 mg via ORAL
  Filled 2015-03-11 (×14): qty 1

## 2015-03-11 MED ORDER — NITROGLYCERIN 0.4 MG SL SUBL: 0.4000 mg | SUBLINGUAL_TABLET | SUBLINGUAL | Status: DC | PRN

## 2015-03-11 MED ORDER — ACETAMINOPHEN 650 MG RE SUPP
650.0000 mg | Freq: Four times a day (QID) | RECTAL | Status: DC | PRN
Start: 2015-03-11 — End: 2015-03-18

## 2015-03-11 MED ORDER — VITAMIN B-12 1000 MCG PO TABS
1000.0000 ug | ORAL_TABLET | Freq: Every day | ORAL | Status: DC
  Administered 2015-03-12 – 2015-03-18 (×7): 1000 ug via ORAL
  Filled 2015-03-11 (×8): qty 1

## 2015-03-11 MED ORDER — DOCUSATE SODIUM 100 MG PO CAPS
100.0000 mg | ORAL_CAPSULE | Freq: Two times a day (BID) | ORAL | Status: DC
  Administered 2015-03-12 – 2015-03-18 (×11): 100 mg via ORAL
  Filled 2015-03-11 (×14): qty 1

## 2015-03-11 MED ORDER — LOSARTAN POTASSIUM 25 MG PO TABS
25.0000 mg | ORAL_TABLET | Freq: Every day | ORAL | Status: DC
  Administered 2015-03-12 – 2015-03-17 (×6): 25 mg via ORAL
  Filled 2015-03-11 (×6): qty 1

## 2015-03-11 MED ORDER — ACETAMINOPHEN 325 MG PO TABS: 650.0000 mg | ORAL_TABLET | Freq: Four times a day (QID) | ORAL | Status: DC | PRN

## 2015-03-11 MED ORDER — LEVOTHYROXINE SODIUM 50 MCG PO TABS
25.0000 ug | ORAL_TABLET | Freq: Every day | ORAL | Status: DC
  Administered 2015-03-12 – 2015-03-18 (×7): 25 ug via ORAL
  Filled 2015-03-11 (×7): qty 1

## 2015-03-11 MED ORDER — BENZONATATE 100 MG PO CAPS
200.0000 mg | ORAL_CAPSULE | Freq: Three times a day (TID) | ORAL | Status: DC | PRN
  Administered 2015-03-11 – 2015-03-18 (×5): 200 mg via ORAL
  Filled 2015-03-11 (×5): qty 2

## 2015-03-11 MED ORDER — LEVOFLOXACIN IN D5W 750 MG/150ML IV SOLN
750.0000 mg | Freq: Once | INTRAVENOUS | Status: AC
  Administered 2015-03-11: 750 mg via INTRAVENOUS
  Filled 2015-03-11: qty 150

## 2015-03-11 NOTE — ED Notes (Signed)
Pt to ed with c/o sob, cough and congestion, pt states she was sent from urgent care for pneumonia and treatment. Pt also reports weakness.

## 2015-03-11 NOTE — Progress Notes (Signed)
INR result of 4.98 critical value called to Dr. Allena Katz; no bleeding with lovenox on hold my pharmacy. Advised to discontinue lovenox order. Also pt reports she cannot tolerate ambien with med order changed to pt's regular sleep med.

## 2015-03-11 NOTE — Progress Notes (Signed)
ANTIBIOTIC CONSULT NOTE - INITIAL  Pharmacy Consult for Levaquin Indication: pneumonia  Allergies  Allergen Reactions  . Macrobid [Nitrofurantoin Monohyd Macro] Diarrhea and Nausea And Vomiting  . Lortab [Hydrocodone-Acetaminophen] Itching, Swelling and Rash    Patient Measurements: Height: 5' (152.4 cm) Weight: 142 lb (64.411 kg) IBW/kg (Calculated) : 45.5  Vital Signs: Temp: 98.2 F (36.8 C) (11/15 1054) Temp Source: Oral (11/15 1054) BP: 122/94 mmHg (11/15 1430) Pulse Rate: 101 (11/15 1430) Intake/Output from previous day:   Intake/Output from this shift:    Labs:  Recent Labs  03/11/15 1135  WBC 11.1*  HGB 9.8*  PLT 152  CREATININE 1.17*   Estimated Creatinine Clearance: 30 mL/min (by C-G formula based on Cr of 1.17). No results for input(s): VANCOTROUGH, VANCOPEAK, VANCORANDOM, GENTTROUGH, GENTPEAK, GENTRANDOM, TOBRATROUGH, TOBRAPEAK, TOBRARND, AMIKACINPEAK, AMIKACINTROU, AMIKACIN in the last 72 hours.   Microbiology: No results found for this or any previous visit (from the past 720 hour(s)).  Medical History: Past Medical History  Diagnosis Date  . CHF (congestive heart failure) (HCC)   . Hypertension   . AICD (automatic cardioverter/defibrillator) present   . Presence of permanent cardiac pacemaker   . Myocardial infarction (HCC)   . Dysrhythmia     Medications:  Scheduled:  . enoxaparin (LOVENOX) injection  30 mg Subcutaneous Q24H   Infusions:  . sodium chloride    . levofloxacin (LEVAQUIN) IV 750 mg (03/11/15 1341)  . [START ON 03/13/2015] levofloxacin (LEVAQUIN) IV     Assessment: 81 y/o F admitted with CAP.   Goal of Therapy:  Resolution of Infection  Plan:  Levaquin 750 mg iv once ordered in ED. Will order Levaquin 750 mg iv q 48 hours starting 11/17. Will continue to follow renal fucntion and culture results.   Luisa Hart D 03/11/2015,3:10 PM

## 2015-03-11 NOTE — Progress Notes (Signed)
Dr. Amado Coe notified of patient's wheezing, sob and edema in bilateral lower extremities. Dr. Amado Coe stated to continue ns continuous fluids.

## 2015-03-11 NOTE — ED Provider Notes (Signed)
[facility] Emergency Department Provider Note  Time seen: 11:44 AM  I have reviewed the triage vital signs and the nursing notes.   HISTORY  Chief Complaint Cough and Shortness of Breath    HPI Joanna Roberts is a 81 y.o. female with a past medical history of CHF, hypertension, MI, who presents the emergency department with cough and shortness of breath. According to the patient for the past 3 days she has had a cough and shortness of breath. She went to the urgent care yesterday and was diagnosed with pneumonia and started on antibiotics. She does not recall what antibiotic she is taking currently. States she's had fever and chills at home (subjective). States the cough is worse and so she came to the emergency department for evaluation. Denies chest pain.     Past Medical History  Diagnosis Date  . CHF (congestive heart failure) (HCC)   . Hypertension   . AICD (automatic cardioverter/defibrillator) present   . Presence of permanent cardiac pacemaker   . Myocardial infarction (HCC)   . Dysrhythmia     There are no active problems to display for this patient.   Past Surgical History  Procedure Laterality Date  . Hernia repair    . Insert / replace / remove pacemaker    . Appendectomy    . Abdominal hysterectomy    . Peripheral vascular catheterization Right 10/08/2014    Procedure: Lower Extremity Angiography;  Surgeon: Renford Dills, MD;  Location: [facility] INVASIVE CV LAB;  Service: Cardiovascular;  Laterality: Right;  . Peripheral vascular catheterization  10/08/2014    Procedure: Lower Extremity Intervention;  Surgeon: Renford Dills, MD;  Location: [facility] INVASIVE CV LAB;  Service: Cardiovascular;;    Current Outpatient Rx  Name  Route  Sig  Dispense  Refill  . carvedilol (COREG) 25 MG tablet   Oral   Take 25 mg by mouth 2 (two) times daily with a meal.         . furosemide (LASIX) 20 MG tablet   Oral   Take 20 mg by mouth daily.        Marland Kitchen levothyroxine (SYNTHROID, LEVOTHROID) 25 MCG tablet   Oral   Take 25 mcg by mouth daily before breakfast.         . losartan (COZAAR) 25 MG tablet   Oral   Take 25 mg by mouth daily.         Marland Kitchen oxyCODONE-acetaminophen (ROXICET) 5-325 MG/5ML solution   Oral   Take by mouth every 4 (four) hours as needed for severe pain.         Marland Kitchen temazepam (RESTORIL) 15 MG capsule   Oral   Take 15 mg by mouth at bedtime as needed for sleep.         Marland Kitchen venlafaxine (EFFEXOR) 37.5 MG tablet   Oral   Take 37.5 mg by mouth every morning.         . warfarin (COUMADIN) 3 MG tablet   Oral   Take 3 mg by mouth daily.           Allergies Lortab and Macrobid  History reviewed. No pertinent family history.  Social History Social History  Substance Use Topics  . Smoking status: Never Smoker   . Smokeless tobacco: Never Used  . Alcohol Use: No    Review of Systems Constitutional: Subjective fevers/chills. Cardiovascular: Negative for chest pain. Respiratory: Positive shortness of breath. Positive cough. Gastrointestinal: Negative for abdominal pain. Denies  nausea or vomiting. Genitourinary: Negative for dysuria Neurological: Negative for headache 10-point ROS otherwise negative.  ____________________________________________   PHYSICAL EXAM:  VITAL SIGNS: ED Triage Vitals  Enc Vitals Group     BP 03/11/15 1054 114/66 mmHg     Pulse Rate 03/11/15 1054 106     Resp 03/11/15 1054 20     Temp 03/11/15 1054 98.2 F (36.8 C)     Temp Source 03/11/15 1054 Oral     SpO2 03/11/15 1054 94 %     Weight 03/11/15 1054 142 lb (64.411 kg)     Height 03/11/15 1054 5' (1.524 m)     Head Cir --      Peak Flow --      Pain Score 03/11/15 1058 8     Pain Loc --      Pain Edu? --      Excl. in GC? --    Constitutional: Alert and oriented. Well appearing and in no distress. Eyes: Normal exam ENT   Head: Normocephalic and atraumatic.   Mouth/Throat: Mucous membranes are  moist. Cardiovascular: Normal rate, regular rhythm. No murmur Respiratory: Mild tachypnea. Mild wheeze bilaterally. No rales or rhonchi. Gastrointestinal: Soft and nontender. No distention.   Musculoskeletal: Nontender with normal range of motion in all extremities. Neurologic:  Normal speech and language. No gross focal neurologic deficits  Skin:  Skin is warm, dry and intact.  Psychiatric: Mood and affect are normal. Speech and behavior are normal.   ____________________________________________    EKG  EKG reviewed and interpreted by myself shows atrial fibrillation at 88 bpm, widened QRS, left axis deviation, nonspecific ST changes present. Computer read as acute MI, I do not agree with this interpretation as this is a paced rhythm.  ____________________________________________    RADIOLOGY  Bilateral opacities, chronic versus acute  ____________________________________________    INITIAL IMPRESSION / ASSESSMENT AND PLAN / ED COURSE  Pertinent labs & imaging results that were available during my care of the patient were reviewed by me and considered in my medical decision making (see chart for details).  Patient presents with 3 days of cough and congestion, subjective fevers. Per patient she was diagnosed with pneumonia yesterday urgent care, given antibiotics which she does not recall the name. Worse today, which generalized weakness, so she came to the emergency department for evaluation. Currently the patient has a temperature of 98.2, pulse around 100 bpm respiratory rate around 22. 94% on room air. We will treat with a breathing treatment given her mild wheezes on exam. We'll obtain labs and a chest x-ray.  Patient's labs have resulted showing a sodium of 126, her hyponatremia is likely the cause of her generalized weakness. X-ray shows bilateral densities which could be chronic however given the patient's acute onset of cough/congestion 3 days with subjective fevers and an  elevated white blood cell count suspect likely pneumonia. We'll start on antibiotics, and plan to admit to the hospital for further treatment and evaluation.    ____________________________________________   FINAL CLINICAL IMPRESSION(S) / ED DIAGNOSES  Dyspnea Cough/congestion. Pneumonia Hyponatremia  Minna Antis, MD 03/11/15 [phone]

## 2015-03-11 NOTE — Progress Notes (Signed)
ANTICOAGULATION CONSULT NOTE - Initial Consult  Pharmacy Consult for Coumadin Indication: atrial fibrillation  Allergies  Allergen Reactions  . Macrobid [Nitrofurantoin Monohyd Macro] Diarrhea and Nausea And Vomiting  . Lortab [Hydrocodone-Acetaminophen] Itching, Swelling and Rash    Patient Measurements: Height: 5' (152.4 cm) Weight: 142 lb (64.411 kg) IBW/kg (Calculated) : 45.5  Vital Signs: Temp: 97.8 F (36.6 C) (11/15 1637) Temp Source: Axillary (11/15 1637) BP: 112/83 mmHg (11/15 1637) Pulse Rate: 93 (11/15 1637)  Labs:  Recent Labs  03/10/15 1330 03/11/15 1135 03/11/15 1657  HGB  --  9.8* 9.4*  HCT  --  30.9* 28.8*  PLT  --  152 138*  LABPROT SPECIMEN CLOTTED  --  44.8*  INR  SPOKE WITH Kenesha Moshier PHARMACIST TO RECOLLECT  --  4.98*  CREATININE  --  1.17* 0.98    Estimated Creatinine Clearance: 35.8 mL/min (by C-G formula based on Cr of 0.98).   Medical History: Past Medical History  Diagnosis Date  . CHF (congestive heart failure) (HCC)   . Hypertension   . AICD (automatic cardioverter/defibrillator) present   . Presence of permanent cardiac pacemaker   . Myocardial infarction (HCC)   . Dysrhythmia     Medications:  Prescriptions prior to admission  Medication Sig Dispense Refill Last Dose  . benzonatate (TESSALON) 200 MG capsule Take 200 mg by mouth 3 (three) times daily as needed for cough.   03/11/2015 at Unknown time  . carvedilol (COREG) 25 MG tablet Take 25 mg by mouth 2 (two) times daily.    03/11/2015 at 0815  . docusate sodium (COLACE) 100 MG capsule Take 100 mg by mouth at bedtime as needed for mild constipation.   Past Week at Unknown time  . doxycycline (VIBRA-TABS) 100 MG tablet Take 100 mg by mouth 2 (two) times daily.   03/11/2015 at Unknown time  . furosemide (LASIX) 20 MG tablet Take 20 mg by mouth daily as needed for edema.    Past Week at Unknown time  . hydrochlorothiazide (HYDRODIURIL) 25 MG tablet Take 25 mg by mouth daily.    03/11/2015 at Unknown time  . levothyroxine (SYNTHROID, LEVOTHROID) 25 MCG tablet Take 25 mcg by mouth daily before breakfast.   03/11/2015 at Unknown time  . losartan (COZAAR) 25 MG tablet Take 25 mg by mouth daily.   03/11/2015 at Unknown time  . nitroGLYCERIN (NITROSTAT) 0.4 MG SL tablet Place 0.4 mg under the tongue every 5 (five) minutes as needed for chest pain.   PRN at PRN  . oxyCODONE-acetaminophen (PERCOCET/ROXICET) 5-325 MG tablet Take 1 tablet by mouth every 4 (four) hours as needed for severe pain.   03/11/2015 at 0815  . temazepam (RESTORIL) 15 MG capsule Take 15 mg by mouth at bedtime as needed for sleep.   03/10/2015 at Unknown time  . venlafaxine XR (EFFEXOR-XR) 37.5 MG 24 hr capsule Take 37.5 mg by mouth daily.   03/10/2015 at Unknown time  . vitamin B-12 (CYANOCOBALAMIN) 1000 MCG tablet Take 1,000 mcg by mouth daily.   03/10/2015 at Unknown time  . warfarin (COUMADIN) 3 MG tablet Take 1.5 mg by mouth at bedtime.    03/10/2015 at 2000   Scheduled:  . carvedilol  25 mg Oral BID  . docusate sodium  100 mg Oral BID  . [START ON 03/13/2015] levofloxacin (LEVAQUIN) IV  750 mg Intravenous Q48H  . [START ON 03/12/2015] levothyroxine  25 mcg Oral QAC breakfast  . [START ON 03/12/2015] losartan  25 mg Oral Daily  .  sodium chloride  3 mL Intravenous Q12H  . venlafaxine XR  37.5 mg Oral Daily  . vitamin B-12  1,000 mcg Oral Daily   Infusions:  . sodium chloride 75 mL/hr at 03/11/15 1914    Assessment: 81 y/o F with a h/o CAF on Coumadin 1.5 mg daily PTA with supratherapeutic INR.   Goal of Therapy:  INR 2-3   Plan:  Will hold Coumadin for now and f/u AM INR.  Luisa Hart D 03/11/2015,8:00 PM

## 2015-03-11 NOTE — H&P (Signed)
[facility] - [place] [facility]   PATIENT NAME: Joanna Roberts    MR#:  [id number]  DATE OF BIRTH:  1928/09/19  DATE OF ADMISSION:  03/11/2015  PRIMARY CARE PHYSICIAN: Marisue Ivan, MD   REQUESTING/REFERRING PHYSICIAN: Minna Antis, MD  CHIEF COMPLAINT:  Cough and shortness of breath  HISTORY OF PRESENT ILLNESS:  Joanna Roberts  is a 81 y.o. female with a known history of congestive heart failure, chronic atrial fibrillation on Coumadin, essential hypertension and coronary artery disease is presenting to the ED with a chief complaint of cough and shortness of breath for the past 3-4 days. Patient is reporting greenish yellow phlegm while coughing. Was seen by urgent care physician 2 days ago and was started on doxycycline. Patient took only one dose of medicine with no significant improvement. Since last night patient's cough and shortness of breath are progressively getting worse and came into the ED today. Chest x-ray has revealed right upper lobe pneumonia  PAST MEDICAL HISTORY:   Past Medical History  Diagnosis Date  . CHF (congestive heart failure) (HCC)   . Hypertension   . AICD (automatic cardioverter/defibrillator) present   . Presence of permanent cardiac pacemaker   . Myocardial infarction (HCC)   . Dysrhythmia     PAST SURGICAL HISTOIRY:   Past Surgical History  Procedure Laterality Date  . Hernia repair    . Insert / replace / remove pacemaker    . Appendectomy    . Abdominal hysterectomy    . Peripheral vascular catheterization Right 10/08/2014    Procedure: Lower Extremity Angiography;  Surgeon: Renford Dills, MD;  Location: [facility] INVASIVE CV LAB;  Service: Cardiovascular;  Laterality: Right;  . Peripheral vascular catheterization  10/08/2014    Procedure: Lower Extremity Intervention;  Surgeon: Renford Dills, MD;  Location: [facility] INVASIVE CV LAB;  Service: Cardiovascular;;    SOCIAL HISTORY:   Social History  Substance  Use Topics  . Smoking status: Never Smoker   . Smokeless tobacco: Never Used  . Alcohol Use: No   patient lives alone  FAMILY HISTORY:  Hypertension cardiac conditions and in her family DRUG ALLERGIES:   Allergies  Allergen Reactions  . Macrobid [Nitrofurantoin Monohyd Macro] Diarrhea and Nausea And Vomiting  . Lortab [Hydrocodone-Acetaminophen] Itching, Swelling and Rash    REVIEW OF SYSTEMS:  CONSTITUTIONAL: No fever, fatigue or weakness.  EYES: No blurred or double vision.  EARS, NOSE, AND THROAT: No tinnitus or ear pain.  RESPIRATORY:   reporting cough with greenish yellow phlegm,  withshortness of breath,  Denies wheezing or hemoptysis.  CARDIOVASCULAR: No chest pain, orthopnea, edema.  GASTROINTESTINAL: No nausea, vomiting, diarrhea or abdominal pain.  GENITOURINARY: No dysuria, hematuria.  ENDOCRINE: No polyuria, nocturia,  HEMATOLOGY: No anemia, easy bruising or bleeding SKIN: No rash or lesion. MUSCULOSKELETAL: No joint pain or arthritis.   NEUROLOGIC: No tingling, numbness, weakness.  PSYCHIATRY: No anxiety or depression.   MEDICATIONS AT HOME:   Prior to Admission medications   Medication Sig Start Date End Date Taking? Authorizing Provider  benzonatate (TESSALON) 200 MG capsule Take 200 mg by mouth 3 (three) times daily as needed for cough.   Yes Historical Provider, MD  carvedilol (COREG) 25 MG tablet Take 25 mg by mouth 2 (two) times daily.    Yes Historical Provider, MD  docusate sodium (COLACE) 100 MG capsule Take 100 mg by mouth at bedtime as needed for mild constipation.   Yes Historical Provider, MD  doxycycline (VIBRA-TABS)  100 MG tablet Take 100 mg by mouth 2 (two) times daily. 03/10/15 03/17/15 Yes Historical Provider, MD  furosemide (LASIX) 20 MG tablet Take 20 mg by mouth daily as needed for edema.    Yes Historical Provider, MD  hydrochlorothiazide (HYDRODIURIL) 25 MG tablet Take 25 mg by mouth daily.   Yes Historical Provider, MD  levothyroxine  (SYNTHROID, LEVOTHROID) 25 MCG tablet Take 25 mcg by mouth daily before breakfast.   Yes Historical Provider, MD  losartan (COZAAR) 25 MG tablet Take 25 mg by mouth daily.   Yes Historical Provider, MD  nitroGLYCERIN (NITROSTAT) 0.4 MG SL tablet Place 0.4 mg under the tongue every 5 (five) minutes as needed for chest pain.   Yes Historical Provider, MD  oxyCODONE-acetaminophen (PERCOCET/ROXICET) 5-325 MG tablet Take 1 tablet by mouth every 4 (four) hours as needed for severe pain.   Yes Historical Provider, MD  temazepam (RESTORIL) 15 MG capsule Take 15 mg by mouth at bedtime as needed for sleep.   Yes Historical Provider, MD  venlafaxine XR (EFFEXOR-XR) 37.5 MG 24 hr capsule Take 37.5 mg by mouth daily.   Yes Historical Provider, MD  vitamin B-12 (CYANOCOBALAMIN) 1000 MCG tablet Take 1,000 mcg by mouth daily.   Yes Historical Provider, MD  warfarin (COUMADIN) 3 MG tablet Take 1.5 mg by mouth at bedtime.    Yes Historical Provider, MD      VITAL SIGNS:  Blood pressure 103/72, pulse 97, temperature 98.2 F (36.8 C), temperature source Oral, resp. rate 21, height 5' (1.524 m), weight 64.411 kg (142 lb), SpO2 100 %.  PHYSICAL EXAMINATION:  GENERAL:  81 y.o.-year-old patient lying in the bed with no acute distress.  EYES: Pupils equal, round, reactive to light and accommodation. No scleral icterus. Extraocular muscles intact.  HEENT: Head atraumatic, normocephalic. Oropharynx and nasopharynx clear.  NECK:  Supple, no jugular venous distention. No thyroid enlargement, no tenderness.  LUNGS: Normal breath sounds bilaterally except right upper lobe with crackles, no wheezing, rales,rhonchi or crepitation. No use of accessory muscles of respiration.  CARDIOVASCULAR: S1, S2 normal. No murmurs, rubs, or gallops.  ABDOMEN: Soft, nontender, nondistended. Bowel sounds present. No organomegaly or mass.  EXTREMITIES: No pedal edema, cyanosis, or clubbing.  NEUROLOGIC: Cranial nerves II through XII are  intact. Muscle strength 5/5 in all extremities. Sensation intact. Gait not checked.  PSYCHIATRIC: The patient is alert and oriented x 3.  SKIN: No obvious rash, lesion, or ulcer.   LABORATORY PANEL:   CBC  Recent Labs Lab 03/11/15 1135  WBC 11.1*  HGB 9.8*  HCT 30.9*  PLT 152   ------------------------------------------------------------------------------------------------------------------  Chemistries   Recent Labs Lab 03/11/15 1135  NA 126*  K 4.5  CL 96*  CO2 24  GLUCOSE 179*  BUN 26*  CREATININE 1.17*  CALCIUM 8.9   ------------------------------------------------------------------------------------------------------------------  Cardiac Enzymes No results for input(s): TROPONINI in the last 168 hours. ------------------------------------------------------------------------------------------------------------------  RADIOLOGY:  Dg Chest 2 View  03/11/2015  ADDENDUM REPORT: 03/11/2015 13:02 ADDENDUM: When compared with prior exams from the previous day as well as 10/22/2014 the changes seen in both lungs are chronic in nature. No acute abnormality is seen. Electronically Signed   By: Alcide Clever M.D.   On: 03/11/2015 13:02  03/11/2015  CLINICAL DATA:  Shortness of breath with cough and congestion. EXAM: CHEST  2 VIEW COMPARISON:  Report from [place] on 03/10/2015. Images not available. FINDINGS: There is a left cardiac ICD. Hazy densities at both lung bases and the lateral  right upper lung. Suspect these are chronic densities but uncertain. There is dextroscoliosis of the lower thoracic spine. Heart size appears to be enlarged but poorly characterized. There is marked kyphosis in lower thoracic spine. No large pleural effusions. Compression fracture in the mid thoracic spine of unknown age. IMPRESSION: Patchy densities at the lung bases and right upper lung. Suspect these represent chronic changes but the comparison images are not available. No evidence for  pulmonary edema. Scoliosis. Electronically Signed: By: Richarda Overlie M.D. On: 03/11/2015 12:51    EKG:   Orders placed or performed during the hospital encounter of 03/11/15  . ED EKG  . ED EKG    IMPRESSION AND PLAN:   1 sepsis secondary to right-sided pneumonia Meets septic criteria with leukocytosis and tachycardia Chest x-ray reveals right upper lobe infiltrates Provide IV antibiotics with IV levofloxacin after blood cultures and sputum cultures are obtained. Will check urine for Legionella antigen. And streptococcal antigen Nebulizer treatments if needed  2. Hyponatremia likely 2/2 hypovolemic hyponatremia from poor by mouth intake from problem #1 Provide hydration with IV fluids and monitor sodium Monitor for symptoms and signs of fluid overload as patient has history of congestive heart failure, not currently of fluid overloaded. Hold hydrochlorothiazide and Lasix for now  3. Chronic atrial fibrillation, rate controlled, on Coumadin INR is at 3.45 today. We will hold Coumadin tonight. Continue home medications except Coumadin Will check PT/INR in a.m. Coumadin management per pharmacy  4. Chronic history of congestive heart failure-ejection fraction unknown Not fluid overloaded at this time. Will hold hydrochlorothiazide and Lasix in view of hyponatremia  5. Essential hypertension Blood pressure is low normal at this time. Will hold off on the Lasix and hydrochlorothiazide. Continue Cozaar with holding parameters    All the records are reviewed and case discussed with ED provider. Management plans discussed with the patient, family and they are in agreement.  CODE STATUS: Full code, daughter  TOTAL TIME TAKING CARE OF THIS PATIENT: 45 minutes.    Ramonita Lab M.D on 03/11/2015 at 2:41 PM  Between 7am to 6pm - Pager - [phone]  After 6pm go to [email or web address] - password [email or web address] [facility]  [facility]  Office  [phone]  CC: Primary care  physician; Marisue Ivan, MD

## 2015-03-12 ENCOUNTER — Ambulatory Visit: Payer: PPO | Admitting: Obstetrics and Gynecology

## 2015-03-12 LAB — EXPECTORATED SPUTUM ASSESSMENT W GRAM STAIN, RFLX TO RESP C

## 2015-03-12 LAB — CBC
HEMATOCRIT: 28.8 % — AB (ref 35.0–47.0)
HEMOGLOBIN: 9.7 g/dL — AB (ref 12.0–16.0)
MCH: 31.3 pg (ref 26.0–34.0)
MCHC: 33.6 g/dL (ref 32.0–36.0)
MCV: 93.1 fL (ref 80.0–100.0)
Platelets: 144 10*3/uL — ABNORMAL LOW (ref 150–440)
RBC: 3.1 MIL/uL — ABNORMAL LOW (ref 3.80–5.20)
RDW: 13.9 % (ref 11.5–14.5)
WBC: 10.2 10*3/uL (ref 3.6–11.0)

## 2015-03-12 LAB — EXPECTORATED SPUTUM ASSESSMENT W REFEX TO RESP CULTURE

## 2015-03-12 LAB — BASIC METABOLIC PANEL
ANION GAP: 5 (ref 5–15)
BUN: 21 mg/dL — ABNORMAL HIGH (ref 6–20)
CO2: 24 mmol/L (ref 22–32)
Calcium: 8.5 mg/dL — ABNORMAL LOW (ref 8.9–10.3)
Chloride: 99 mmol/L — ABNORMAL LOW (ref 101–111)
Creatinine, Ser: 0.89 mg/dL (ref 0.44–1.00)
GFR calc Af Amer: >60 mL/min (ref >60)
GFR, EST NON AFRICAN AMERICAN: 58 mL/min — AB (ref >60)
GLUCOSE: 120 mg/dL — AB (ref 65–99)
POTASSIUM: 4.3 mmol/L (ref 3.5–5.1)
Sodium: 128 mmol/L — ABNORMAL LOW (ref 135–145)

## 2015-03-12 LAB — PROTIME-INR
INR: 4.28 — AB
INR: 4.11 — AB
Prothrombin Time: 40 seconds — ABNORMAL HIGH (ref 11.4–15.0)
Prothrombin Time: 38.8 seconds — ABNORMAL HIGH (ref 11.4–15.0)

## 2015-03-12 MED ORDER — SODIUM CHLORIDE 0.9 % IV SOLN
INTRAVENOUS | Status: DC
  Administered 2015-03-12 – 2015-03-13 (×2): via INTRAVENOUS

## 2015-03-12 NOTE — Progress Notes (Signed)
Notified Dr. Imogene Burn of critical value PT 39. INR 4.11

## 2015-03-12 NOTE — Progress Notes (Signed)
   03/12/15 0900  Clinical Encounter Type  Visited With Patient and family together  Visit Type Initial  Referral From Nurse  Consult/Referral To Chaplain  Spiritual Encounters  Spiritual Needs Prayer  Stress Factors  Patient Stress Factors Exhausted;Health changes  Family Stress Factors Health changes  Met w/patient & spouse to provide pastoral care & prayer.   [place]. Cour[place] G. Braham

## 2015-03-12 NOTE — Progress Notes (Signed)
[facility] - [place] at [facility]   PATIENT NAME: Joanna Roberts    MR#:  [id number]  DATE OF BIRTH:  June 08, 1928  SUBJECTIVE:  CHIEF COMPLAINT:   Chief Complaint  Patient presents with  . Cough  . Shortness of Breath   cough and shortness of breath. Chronic Dysphagia per her daughter.  REVIEW OF SYSTEMS:  CONSTITUTIONAL: Has fever, has generalized weakness.  EYES: No blurred or double vision.  EARS, NOSE, AND THROAT: No tinnitus or ear pain.  RESPIRATORY: Has cough and shortness of breath, no wheezing or hemoptysis.  CARDIOVASCULAR: No chest pain, orthopnea, edema.  GASTROINTESTINAL: No nausea, vomiting, diarrhea or abdominal pain.  GENITOURINARY: No dysuria, hematuria.  ENDOCRINE: No polyuria, nocturia,  HEMATOLOGY: No anemia, easy bruising or bleeding SKIN: No rash or lesion. MUSCULOSKELETAL: No joint pain or arthritis.   NEUROLOGIC: No tingling, numbness, weakness.  PSYCHIATRY: No anxiety or depression.   DRUG ALLERGIES:   Allergies  Allergen Reactions  . Macrobid [Nitrofurantoin Monohyd Macro] Diarrhea and Nausea And Vomiting  . Lortab [Hydrocodone-Acetaminophen] Itching, Swelling and Rash    VITALS:  Blood pressure 156/62, pulse 81, temperature 99.5 F (37.5 C), temperature source Oral, resp. rate 18, height 5' (1.524 m), weight 66.089 kg (145 lb 11.2 oz), SpO2 93 %.  PHYSICAL EXAMINATION:  GENERAL:  81 y.o.-year-old patient lying in the bed with no acute distress.  EYES: Pupils equal, round, reactive to light and accommodation. No scleral icterus. Extraocular muscles intact.  HEENT: Head atraumatic, normocephalic. Oropharynx and nasopharynx clear.  NECK:  Supple, no jugular venous distention. No thyroid enlargement, no tenderness.  LUNGS: Normal breath sounds bilaterally, no wheezing, crackles on the right side. No use of accessory muscles of respiration.  CARDIOVASCULAR: S1, S2 normal. No murmurs, rubs, or gallops.  ABDOMEN: Soft,  nontender, nondistended. Bowel sounds present. No organomegaly or mass.  EXTREMITIES: No pedal edema, cyanosis, or clubbing.  NEUROLOGIC: Cranial nerves II through XII are intact. Muscle strength 4/5 in all extremities. Sensation intact. Gait not checked.  PSYCHIATRIC: The patient is alert and oriented x 3.  SKIN: No obvious rash, lesion, or ulcer.    LABORATORY PANEL:   CBC  Recent Labs Lab 03/12/15 0541  WBC 10.2  HGB 9.7*  HCT 28.8*  PLT 144*   ------------------------------------------------------------------------------------------------------------------  Chemistries   Recent Labs Lab 03/12/15 0541  NA 128*  K 4.3  CL 99*  CO2 24  GLUCOSE 120*  BUN 21*  CREATININE 0.89  CALCIUM 8.5*   ------------------------------------------------------------------------------------------------------------------  Cardiac Enzymes No results for input(s): TROPONINI in the last 168 hours. ------------------------------------------------------------------------------------------------------------------  RADIOLOGY:  Dg Chest 2 View  03/11/2015  ADDENDUM REPORT: 03/11/2015 13:02 ADDENDUM: When compared with prior exams from the previous day as well as 10/22/2014 the changes seen in both lungs are chronic in nature. No acute abnormality is seen. Electronically Signed   By: Alcide Clever M.D.   On: 03/11/2015 13:02  03/11/2015  CLINICAL DATA:  Shortness of breath with cough and congestion. EXAM: CHEST  2 VIEW COMPARISON:  Report from [place] clinic on 03/10/2015. Images not available. FINDINGS: There is a left cardiac ICD. Hazy densities at both lung bases and the lateral right upper lung. Suspect these are chronic densities but uncertain. There is dextroscoliosis of the lower thoracic spine. Heart size appears to be enlarged but poorly characterized. There is marked kyphosis in lower thoracic spine. No large pleural effusions. Compression fracture in the mid thoracic spine of unknown  age. IMPRESSION: Patchy densities  at the lung bases and right upper lung. Suspect these represent chronic changes but the comparison images are not available. No evidence for pulmonary edema. Scoliosis. Electronically Signed: By: Richarda Overlie M.D. On: 03/11/2015 12:51    EKG:   Orders placed or performed during the hospital encounter of 03/11/15  . ED EKG  . ED EKG  . EKG 12-Lead  . EKG 12-Lead    ASSESSMENT AND PLAN:   1 sepsis secondary to right-sided pneumonia Continue IV levofloxacin, follow-up blood cultures and sputum cultures,  Legionella antigen, streptococcal antigen Nebulizer treatments if needed  2. Hyponatremia likely 2/2 hypovolemic hyponatremia from poor by mouth intake. Continue NS IV and follow-up BMP.  Monitor for symptoms and signs of fluid overload as patient has history of congestive heart failure, not currently of fluid overloaded. Hold hydrochlorothiazide and Lasix for now.  3. Chronic atrial fibrillation, rate controlled, was on Coumadin INR is 4.11,  hold Coumadin. Continue Coreg.  4. Chronic history of congestive heart failure-ejection fraction unknown Stable. hold hydrochlorothiazide and Lasix  due to hyponatremia  5. Essential hypertension. continue Coreg and Cozaar, but hold off on the Lasix and hydrochlorothiazide.     Dysphagia per her daughter. Swallowing study and physical therapy.   All the records are reviewed and case discussed with Care Management/Social Workerr. Management plans discussed with the patient, her daughter and they are in agreement. Greater than 50% time was spent on coordination of care and face-to-face counseling. CODE STATUS: Full code  TOTAL TIME TAKING CARE OF THIS PATIENT: 42 minutes.   POSSIBLE D/C IN 3 DAYS, DEPENDING ON CLINICAL CONDITION.   Shaune Pollack M.D on 03/12/2015 at 4:21 PM  Between 7am to 6pm - Pager - [phone]  After 6pm go to [email or web address] - password [email or web address] [facility]  [facility]   Office  [phone]  CC: Primary care physician; Marisue Ivan, MD

## 2015-03-12 NOTE — Care Management Note (Signed)
Case Management Note  Patient Details  Name: Joanna Roberts MRN: [id number] Date of Birth: 1928/08/31  Subjective/Objective:                  Met with patient to discuss discharge planning. She is short of breath but not requiring O2 supplement at this time. She states she lives at independent living at [facility]. She has a rolling walker that she uses to ambulate with. They prepare her meals and the room is handicap accessible. They also can provide transportation with prior arrangements. She has a daughter involved in her care but she is not present. She is on Coumadin and followed by Dr. Netty Starring.   Action/Plan:   List of home health agencies left with patient although [facility] has PT on-site. RNCM will continue to follow.   Expected Discharge Date:  03/14/15               Expected Discharge Plan:     In-House Referral:     Discharge planning Services  CM Consult  Post Acute Care Choice:  Home Health Choice offered to:  Patient  DME Arranged:    DME Agency:     HH Arranged:    [place] Agency:     Status of Service:  In process, will continue to follow  Medicare Important Message Given:    Date Medicare IM Given:    Medicare IM give by:    Date Additional Medicare IM Given:    Additional Medicare Important Message give by:     If discussed at [place] of Stay Meetings, dates discussed:    Additional Comments:  Marshell Garfinkel, RN 03/12/2015, 10:55 AM

## 2015-03-12 NOTE — Progress Notes (Signed)
ANTICOAGULATION CONSULT NOTE - Follow Up  Pharmacy Consult for Coumadin Indication: atrial fibrillation  Allergies  Allergen Reactions  . Macrobid [Nitrofurantoin Monohyd Macro] Diarrhea and Nausea And Vomiting  . Lortab [Hydrocodone-Acetaminophen] Itching, Swelling and Rash    Patient Measurements: Height: 5' (152.4 cm) Weight: 145 lb 11.2 oz (66.089 kg) IBW/kg (Calculated) : 45.5  Vital Signs: Temp: 97.6 F (36.4 C) (11/16 0742) Temp Source: Oral (11/16 0742) BP: 146/85 mmHg (11/16 0742) Pulse Rate: 102 (11/16 0742)  Labs:  Recent Labs  03/11/15 1135 03/11/15 1657 03/12/15 0108 03/12/15 0541  HGB 9.8* 9.4*  --  9.7*  HCT 30.9* 28.8*  --  28.8*  PLT 152 138*  --  144*  LABPROT  --  44.8* 40.0* 38.8*  INR  --  4.98* 4.28* 4.11*  CREATININE 1.17* 0.98  --  0.89    Estimated Creatinine Clearance: 39.9 mL/min (by C-G formula based on Cr of 0.89).   Medical History: Past Medical History  Diagnosis Date  . CHF (congestive heart failure) (HCC)   . Hypertension   . AICD (automatic cardioverter/defibrillator) present   . Presence of permanent cardiac pacemaker   . Myocardial infarction (HCC)   . Dysrhythmia     Medications:  Prescriptions prior to admission  Medication Sig Dispense Refill Last Dose  . benzonatate (TESSALON) 200 MG capsule Take 200 mg by mouth 3 (three) times daily as needed for cough.   03/11/2015 at Unknown time  . carvedilol (COREG) 25 MG tablet Take 25 mg by mouth 2 (two) times daily.    03/11/2015 at 0815  . docusate sodium (COLACE) 100 MG capsule Take 100 mg by mouth at bedtime as needed for mild constipation.   Past Week at Unknown time  . doxycycline (VIBRA-TABS) 100 MG tablet Take 100 mg by mouth 2 (two) times daily.   03/11/2015 at Unknown time  . furosemide (LASIX) 20 MG tablet Take 20 mg by mouth daily as needed for edema.    Past Week at Unknown time  . hydrochlorothiazide (HYDRODIURIL) 25 MG tablet Take 25 mg by mouth daily.    03/11/2015 at Unknown time  . levothyroxine (SYNTHROID, LEVOTHROID) 25 MCG tablet Take 25 mcg by mouth daily before breakfast.   03/11/2015 at Unknown time  . losartan (COZAAR) 25 MG tablet Take 25 mg by mouth daily.   03/11/2015 at Unknown time  . nitroGLYCERIN (NITROSTAT) 0.4 MG SL tablet Place 0.4 mg under the tongue every 5 (five) minutes as needed for chest pain.   PRN at PRN  . oxyCODONE-acetaminophen (PERCOCET/ROXICET) 5-325 MG tablet Take 1 tablet by mouth every 4 (four) hours as needed for severe pain.   03/11/2015 at 0815  . temazepam (RESTORIL) 15 MG capsule Take 15 mg by mouth at bedtime as needed for sleep.   03/10/2015 at Unknown time  . venlafaxine XR (EFFEXOR-XR) 37.5 MG 24 hr capsule Take 37.5 mg by mouth daily.   03/10/2015 at Unknown time  . vitamin B-12 (CYANOCOBALAMIN) 1000 MCG tablet Take 1,000 mcg by mouth daily.   03/10/2015 at Unknown time  . warfarin (COUMADIN) 3 MG tablet Take 1.5 mg by mouth at bedtime.    03/10/2015 at 2000   Scheduled:  . carvedilol  25 mg Oral BID  . docusate sodium  100 mg Oral BID  . [START ON 03/13/2015] levofloxacin (LEVAQUIN) IV  750 mg Intravenous Q48H  . levothyroxine  25 mcg Oral QAC breakfast  . losartan  25 mg Oral Daily  . sodium  chloride  3 mL Intravenous Q12H  . venlafaxine XR  37.5 mg Oral Daily  . vitamin B-12  1,000 mcg Oral Daily    Assessment: 81 y/o F with a h/o CAF on Coumadin 1.5 mg daily PTA.  Patient with INR today of 4.11.    Goal of Therapy:  INR 2-3   Plan:  Will continue to hold Coumadin as INR remains supratherapeutic.  Will recheck INR in AM. Of note, patient is now receiving antimicrobial therapy with Levaquin 750 mg IV q48h.  This interaction may increase the INR.  Will need to follow closely and consider resumption of lower dose once INR is 3 or less.   Ailynn Gow G 03/12/2015,8:49 AM

## 2015-03-12 NOTE — Clinical Documentation Improvement (Signed)
  Internal Medicine  Based on clinical indicators present on admit please document any diagnoses treated.   Acute Kidney Injury  Acute on Chronic Renal Failure  Chronic Renal Failure  Other  Clinically Undetermined  Document any associated diagnoses/conditions.  Supporting Information: -- On admit:  BUN/Cr:  11/15 26/1.17,  11/16  21/0.89 -- On admit:  GFR:  11/15 42,  11/16 58 -- NS 55ml/hr, monitoring renal function  Please exercise your independent, professional judgment when responding. A specific answer is not anticipated or expected.  Thank You,  Beverley Fiedler RN [facility] [place] [phone]

## 2015-03-12 NOTE — Progress Notes (Signed)
Paged prime doctor regarding critical INR of 4.11. Waiting for return call

## 2015-03-13 LAB — BASIC METABOLIC PANEL
Anion gap: 6 (ref 5–15)
BUN: 18 mg/dL (ref 6–20)
CALCIUM: 8.6 mg/dL — AB (ref 8.9–10.3)
CO2: 23 mmol/L (ref 22–32)
CREATININE: 0.87 mg/dL (ref 0.44–1.00)
Chloride: 103 mmol/L (ref 101–111)
GFR calc non Af Amer: 59 mL/min — ABNORMAL LOW (ref >60)
GLUCOSE: 117 mg/dL — AB (ref 65–99)
Potassium: 4.1 mmol/L (ref 3.5–5.1)
Sodium: 132 mmol/L — ABNORMAL LOW (ref 135–145)

## 2015-03-13 LAB — PROTIME-INR
INR: 2.82
PROTHROMBIN TIME: 29.2 s — AB (ref 11.4–15.0)

## 2015-03-13 LAB — MAGNESIUM: Magnesium: 1.7 mg/dL (ref 1.7–2.4)

## 2015-03-13 LAB — STREP PNEUMONIAE URINARY ANTIGEN: Strep Pneumo Urinary Antigen: NEGATIVE

## 2015-03-13 MED ORDER — GUAIFENESIN 100 MG/5ML PO SOLN
5.0000 mL | ORAL | Status: DC | PRN
  Administered 2015-03-13 – 2015-03-16 (×4): 100 mg via ORAL
  Filled 2015-03-13 (×4): qty 10

## 2015-03-13 MED ORDER — PANTOPRAZOLE SODIUM 40 MG PO TBEC
40.0000 mg | DELAYED_RELEASE_TABLET | Freq: Two times a day (BID) | ORAL | Status: DC
  Administered 2015-03-13 – 2015-03-18 (×10): 40 mg via ORAL
  Filled 2015-03-13 (×10): qty 1

## 2015-03-13 MED ORDER — WARFARIN - PHARMACIST DOSING INPATIENT
Freq: Every day | Status: DC
  Administered 2015-03-14 – 2015-03-17 (×2)

## 2015-03-13 MED ORDER — WARFARIN 0.5 MG HALF TABLET
0.5000 mg | ORAL_TABLET | Freq: Once | ORAL | Status: AC
  Administered 2015-03-13: 0.5 mg via ORAL
  Filled 2015-03-13: qty 1

## 2015-03-13 MED ORDER — MAGNESIUM HYDROXIDE 400 MG/5ML PO SUSP
30.0000 mL | Freq: Two times a day (BID) | ORAL | Status: DC | PRN
  Administered 2015-03-13 – 2015-03-15 (×3): 30 mL via ORAL
  Filled 2015-03-13 (×3): qty 30

## 2015-03-13 NOTE — Progress Notes (Signed)
Alert . oob to chair. Cough decreased markedly after  guaituss dm. Remains dyspneic with exertion. Weak with poor endurance. Pt to start on protonix 40mg  po bid and MOM 30CC PO Q12H PRN  PER DR CHEN. NO BM SINCE PRIOR TO ADMISSION. NEEDS ASSIST WITH ALL ADLS

## 2015-03-13 NOTE — Progress Notes (Signed)
ANTICOAGULATION CONSULT NOTE - Follow Up  Pharmacy Consult for Coumadin Indication: atrial fibrillation  Allergies  Allergen Reactions  . Macrobid [Nitrofurantoin Monohyd Macro] Diarrhea and Nausea And Vomiting  . Lortab [Hydrocodone-Acetaminophen] Itching, Swelling and Rash    Patient Measurements: Height: 5' (152.4 cm) Weight: 148 lb 14.4 oz (67.541 kg) IBW/kg (Calculated) : 45.5  Vital Signs: Temp: 99.1 F (37.3 C) (11/17 0746) Temp Source: Oral (11/17 0746) BP: 129/79 mmHg (11/17 0746) Pulse Rate: 86 (11/17 0746)  Labs:  Recent Labs  03/11/15 1135 03/11/15 1657 03/12/15 0108 03/12/15 0541 03/13/15 0438  HGB 9.8* 9.4*  --  9.7*  --   HCT 30.9* 28.8*  --  28.8*  --   PLT 152 138*  --  144*  --   LABPROT  --  44.8* 40.0* 38.8* 29.2*  INR  --  4.98* 4.28* 4.11* 2.82  CREATININE 1.17* 0.98  --  0.89 0.87    Estimated Creatinine Clearance: 41.3 mL/min (by C-G formula based on Cr of 0.87).   Medical History: Past Medical History  Diagnosis Date  . CHF (congestive heart failure) (HCC)   . Hypertension   . AICD (automatic cardioverter/defibrillator) present   . Presence of permanent cardiac pacemaker   . Myocardial infarction (HCC)   . Dysrhythmia     Medications:  Prescriptions prior to admission  Medication Sig Dispense Refill Last Dose  . benzonatate (TESSALON) 200 MG capsule Take 200 mg by mouth 3 (three) times daily as needed for cough.   03/11/2015 at Unknown time  . carvedilol (COREG) 25 MG tablet Take 25 mg by mouth 2 (two) times daily.    03/11/2015 at 0815  . docusate sodium (COLACE) 100 MG capsule Take 100 mg by mouth at bedtime as needed for mild constipation.   Past Week at Unknown time  . doxycycline (VIBRA-TABS) 100 MG tablet Take 100 mg by mouth 2 (two) times daily.   03/11/2015 at Unknown time  . furosemide (LASIX) 20 MG tablet Take 20 mg by mouth daily as needed for edema.    Past Week at Unknown time  . hydrochlorothiazide (HYDRODIURIL) 25  MG tablet Take 25 mg by mouth daily.   03/11/2015 at Unknown time  . levothyroxine (SYNTHROID, LEVOTHROID) 25 MCG tablet Take 25 mcg by mouth daily before breakfast.   03/11/2015 at Unknown time  . losartan (COZAAR) 25 MG tablet Take 25 mg by mouth daily.   03/11/2015 at Unknown time  . nitroGLYCERIN (NITROSTAT) 0.4 MG SL tablet Place 0.4 mg under the tongue every 5 (five) minutes as needed for chest pain.   PRN at PRN  . oxyCODONE-acetaminophen (PERCOCET/ROXICET) 5-325 MG tablet Take 1 tablet by mouth every 4 (four) hours as needed for severe pain.   03/11/2015 at 0815  . temazepam (RESTORIL) 15 MG capsule Take 15 mg by mouth at bedtime as needed for sleep.   03/10/2015 at Unknown time  . venlafaxine XR (EFFEXOR-XR) 37.5 MG 24 hr capsule Take 37.5 mg by mouth daily.   03/10/2015 at Unknown time  . vitamin B-12 (CYANOCOBALAMIN) 1000 MCG tablet Take 1,000 mcg by mouth daily.   03/10/2015 at Unknown time  . warfarin (COUMADIN) 3 MG tablet Take 1.5 mg by mouth at bedtime.    03/10/2015 at 2000   Scheduled:  . carvedilol  25 mg Oral BID  . docusate sodium  100 mg Oral BID  . levofloxacin (LEVAQUIN) IV  750 mg Intravenous Q48H  . levothyroxine  25 mcg Oral QAC breakfast  .  losartan  25 mg Oral Daily  . sodium chloride  3 mL Intravenous Q12H  . venlafaxine XR  37.5 mg Oral Daily  . vitamin B-12  1,000 mcg Oral Daily  . warfarin  0.5 mg Oral ONCE-1800  . Warfarin - Pharmacist Dosing Inpatient   Does not apply q1800    Assessment: 81 y/o F with a h/o CAF on Coumadin 1.5 mg daily PTA.    INR History: 11/15: INR - 4.98, warfarin held 11/16: INR - 4.28, warfarin held 11/17: INR - 2.82, warfarin held   Goal of Therapy:  INR 2-3   Plan:  INR within goal range of 2-3 today.  Will restart lower dose of warfarin at 0.5 mg po daily.  Previously patient was taking 1.5 mg po daily.  Patient is also receiving IV Levaquin which may potentiate warfarin's effects on the INR.   INR in AM for  monitoring.  Pharmacy will continue to follow.   Jeston Junkins G 03/13/2015,9:11 AM

## 2015-03-13 NOTE — Evaluation (Signed)
Clinical/Bedside Swallow Evaluation Patient Details  Name: Joanna Roberts MRN: [id number] Date of Birth: December 26, 1928  Today's Date: 03/13/2015 Time: SLP Start Time (ACUTE ONLY): 0945 SLP Stop Time (ACUTE ONLY): 1045 SLP Time Calculation (min) (ACUTE ONLY): 60 min  Past Medical History:  Past Medical History  Diagnosis Date  . CHF (congestive heart failure) (HCC)   . Hypertension   . AICD (automatic cardioverter/defibrillator) present   . Presence of permanent cardiac pacemaker   . Myocardial infarction (HCC)   . Dysrhythmia    Past Surgical History:  Past Surgical History  Procedure Laterality Date  . Hernia repair    . Insert / replace / remove pacemaker    . Appendectomy    . Abdominal hysterectomy    . Peripheral vascular catheterization Right 10/08/2014    Procedure: Lower Extremity Angiography;  Surgeon: Renford Dills, MD;  Location: [facility] INVASIVE CV LAB;  Service: Cardiovascular;  Laterality: Right;  . Peripheral vascular catheterization  10/08/2014    Procedure: Lower Extremity Intervention;  Surgeon: Renford Dills, MD;  Location: [facility] INVASIVE CV LAB;  Service: Cardiovascular;;   HPI:  Pt is a 81 y.o. female with a known history of congestive heart failure, chronic atrial fibrillation on Coumadin, essential hypertension and coronary artery disease is presenting to the ED with a chief complaint of cough and shortness of breath for the past 3-4 days. Patient is reporting greenish yellow phlegm while coughing. Was seen by urgent care physician 2 days ago and was started on doxycycline. Patient took only one dose of medicine with no significant improvement. Since last night patient's cough and shortness of breath are progressively getting worse and came into the ED today. Pt alert and talkative; followed all instructions. Of note, pt described a significant h/o ESOPHAGEAL dysphagia including Esophageal strictures w/ dilitations. Pt stated she took a PPI in the past but was  unsure if on one currently. Pt described ongoing s/s of REFLUX including belching, regurgitation, and globus feelings. Pt described her swallowing "problems" to include "chicken".    Assessment / Plan / Recommendation Clinical Impression  Pt appears to adequately tolerate trials of thin liquids and soft solids w/ no overt s/s of aspiration noted; no oropharyngeal phase dysphagia noted. Pt described significant Esopahgeal phase dysphaiga/dysmotility including s/s of globus, reflux/regurgitation, and belching. Pt stated she has had Esopahgeal dilitations in the past in [place]. She has not seen a GI recently and is not taking a PPI that she is aware of. Due to pt's Esophageal phase dysphagia history, rec. f/u w/ GI consult as pt is at increased risk for aspiration of Reflux material which could then impact her respiratory system including aspiration pneumonia from aspirating Reflux material. Rec. a Dys. 3 diet w/ thin liquids at this time w/ general aspiration and Reflux precautions discussed. Rec. meds in Puree (whole) for easier swallowing as nec.; pt agreed. NSG updated.     Aspiration Risk  Mild aspiration risk (from Esophageal dysphagia/dysmotility)    Diet Recommendation  Dys. 3 w/ thin liquids; moistened foods; Reflux and aspiration precautions  Medication Administration: Whole meds with puree (as needed)    Other  Recommendations Recommended Consults: Consider GI evaluation Oral Care Recommendations: Oral care BID;Patient independent with oral care   Follow up Recommendations   (GI consult)    Frequency and Duration            Swallow Study   General Date of Onset: 03/11/15 HPI: Pt is a 81 y.o. female with a  known history of congestive heart failure, chronic atrial fibrillation on Coumadin, essential hypertension and coronary artery disease is presenting to the ED with a chief complaint of cough and shortness of breath for the past 3-4 days. Patient is reporting greenish yellow phlegm  while coughing. Was seen by urgent care physician 2 days ago and was started on doxycycline. Patient took only one dose of medicine with no significant improvement. Since last night patient's cough and shortness of breath are progressively getting worse and came into the ED today. Pt alert and talkative; followed all instructions. Of note, pt described a significant h/o ESOPHAGEAL dysphagia including Esophageal strictures w/ dilitations. Pt stated she took a PPI in the past but was unsure if on one currently. Pt described ongoing s/s of REFLUX including belching, regurgitation, and globus feelings. Pt described her swallowing "problems" to include "chicken".  Type of Study: Bedside Swallow Evaluation Previous Swallow Assessment: none Diet Prior to this Study: Regular;Thin liquids Temperature Spikes Noted: No (wbc 10.2) Respiratory Status: Room air History of Recent Intubation: No Behavior/Cognition: Alert;Cooperative;Pleasant mood Oral Cavity Assessment: Within Functional Limits Oral Care Completed by SLP: No Oral Cavity - Dentition: Adequate natural dentition Vision: Functional for self-feeding Self-Feeding Abilities: Able to feed self Patient Positioning: Upright in chair Baseline Vocal Quality: Normal Volitional Cough: Strong Volitional Swallow: Able to elicit    Oral/Motor/Sensory Function Overall Oral Motor/Sensory Function: Within functional limits   Ice Chips Ice chips: Within functional limits Presentation: Spoon (fed; 3 trials)   Thin Liquid Thin Liquid: Within functional limits Presentation: Cup;Self Fed (~2-3 ozs total)    Nectar Thick Nectar Thick Liquid: Not tested   Honey Thick Honey Thick Liquid: Not tested   Puree Puree: Within functional limits Presentation: Self Fed;Spoon (3 trials)   Solid Solid: Within functional limits Presentation: Self Fed (2 trials)        Jerilynn Som, MS, CCC-SLP  Hatcher Froning 03/13/2015,2:51 PM

## 2015-03-13 NOTE — Progress Notes (Signed)
[facility] Physicians - [place] at [facility]   PATIENT NAME: Joanna Roberts    MR#:  [id number]  DATE OF BIRTH:  [birth year]  SUBJECTIVE:  CHIEF COMPLAINT:   Chief Complaint  Patient presents with  . Cough  . Shortness of Breath   better cough and shortness of breath. But still weakness. On O2 [place]. REVIEW OF SYSTEMS:  CONSTITUTIONAL: Has fever, has generalized weakness.  EYES: No blurred or double vision.  EARS, NOSE, AND THROAT: No tinnitus or ear pain.  RESPIRATORY: Has cough and shortness of breath, no wheezing or hemoptysis.  CARDIOVASCULAR: No chest pain, orthopnea, edema.  GASTROINTESTINAL: No nausea, vomiting, diarrhea or abdominal pain.  GENITOURINARY: No dysuria, hematuria.  ENDOCRINE: No polyuria, nocturia,  HEMATOLOGY: No anemia, easy bruising or bleeding SKIN: No rash or lesion. MUSCULOSKELETAL: No joint pain or arthritis.   NEUROLOGIC: No tingling, numbness, weakness.  PSYCHIATRY: No anxiety or depression.   DRUG ALLERGIES:   Allergies  Allergen Reactions  . Macrobid [Nitrofurantoin Monohyd Macro] Diarrhea and Nausea And Vomiting  . Lortab [Hydrocodone-Acetaminophen] Itching, Swelling and Rash    VITALS:  Blood pressure 97/60, pulse 100, temperature 98.2 F (36.8 C), temperature source Oral, resp. rate 18, height 5' (1.524 m), weight 67.541 kg (148 lb 14.4 oz), SpO2 94 %.  PHYSICAL EXAMINATION:  GENERAL:  81 y.o.-year-old patient sitting in chair with no acute distress.  EYES: Pupils equal, round, reactive to light and accommodation. No scleral icterus. Extraocular muscles intact.  HEENT: Head atraumatic, normocephalic. Oropharynx and nasopharynx clear.  NECK:  Supple, no jugular venous distention. No thyroid enlargement, no tenderness.  LUNGS: Normal breath sounds bilaterally, no wheezing, crackles on the right side. No use of accessory muscles of respiration.  CARDIOVASCULAR: S1, S2 normal. No murmurs, rubs, or gallops.  ABDOMEN: Soft,  nontender, nondistended. Bowel sounds present. No organomegaly or mass.  EXTREMITIES: No pedal edema, cyanosis, or clubbing.  NEUROLOGIC: Cranial nerves II through XII are intact. Muscle strength 4/5 in all extremities. Sensation intact. Gait not checked.  PSYCHIATRIC: The patient is alert and oriented x 3.  SKIN: No obvious rash, lesion, or ulcer.    LABORATORY PANEL:   CBC  Recent Labs Lab 03/12/15 0541  WBC 10.2  HGB 9.7*  HCT 28.8*  PLT 144*   ------------------------------------------------------------------------------------------------------------------  Chemistries   Recent Labs Lab 03/13/15 0438  NA 132*  K 4.1  CL 103  CO2 23  GLUCOSE 117*  BUN 18  CREATININE 0.87  CALCIUM 8.6*  MG 1.7   ------------------------------------------------------------------------------------------------------------------  Cardiac Enzymes No results for input(s): TROPONINI in the last 168 hours. ------------------------------------------------------------------------------------------------------------------  RADIOLOGY:  No results found.  EKG:   Orders placed or performed during the hospital encounter of 03/11/15  . ED EKG  . ED EKG  . EKG 12-Lead  . EKG 12-Lead    ASSESSMENT AND PLAN:   1 sepsis secondary to right-sided pneumonia Continue IV levofloxacin, follow-up blood cultures (negative so far) and sputum cultures ( normal flora),  Pending Legionella antigen, streptococcal antigen Nebulizer treatments if needed  2. Hyponatremia likely 2/2 hypovolemic hyponatremia from poor by mouth intake. Improving. Discontinue NS IV and follow-up BMP.  Hold hydrochlorothiazide and Lasix for now.  3. Chronic atrial fibrillation, rate controlled, was on Coumadin INR was 4.11, down to 2.82, resume Coumadin PTD. Continue Coreg.  4. Chronic history of congestive heart failure-ejection fraction unknown Stable. hold hydrochlorothiazide and Lasix  due to hyponatremia  5.  Essential hypertension. Controlled. continue Coreg and Cozaar, but  hold off on the Lasix and hydrochlorothiazide.     Dysphagia. Dysphagia 3 diet per Swallowing study.  PT evaluation, the patient needs skilled nursing facility placement. But the patient refused and home health and PT.   All the records are reviewed and case discussed with Care Management/Social Workerr. Management plans discussed with the patient, her daughter and they are in agreement. Greater than 50% time was spent on coordination of care and face-to-face counseling. CODE STATUS: Full code  TOTAL TIME TAKING CARE OF THIS PATIENT: 38 minutes.   POSSIBLE D/C IN 3 DAYS, DEPENDING ON CLINICAL CONDITION.   Shaune Pollack M.D on 03/13/2015 at 4:59 PM  Between 7am to 6pm - Pager - [phone]  After 6pm go to [email or web address] - password [email or web address] [facility]  [place] [facility]  Office  [phone]  CC: Primary care physician; Marisue Ivan, MD

## 2015-03-13 NOTE — Evaluation (Signed)
Physical Therapy Evaluation Patient Details Name: Joanna Roberts MRN: [id number] DOB: July 17, 1928 Today's Date: 03/13/2015   History of Present Illness  81 yo female with onset of SOB was admitted, has PNA, sepsis, a-fib, Hx:  CHF, HTN, CAD  Clinical Impression  Pt was able to stand and walk only short trip, but will be going back to [place] situation at home.  Asked pt to consider more than HHPT, to go to SNF and will have CM continue the conversation with her.  Pt is in need of continual assist with mobiltiy, unsafe to walk or transfer alone.    Follow Up Recommendations SNF    Equipment Recommendations  None recommended by PT (await SNF disposition)    Recommendations for Other Services       Precautions / Restrictions Precautions Precautions: Fall (telemetry) Restrictions Weight Bearing Restrictions: No      Mobility  Bed Mobility               General bed mobility comments: up when PT entered  Transfers Overall transfer level: Needs assistance Equipment used: Rolling walker (2 wheeled);1 person hand held assist Transfers: Sit to/from [employer] Sit to Stand: Min assist;Mod assist Stand pivot transfers: Min assist;Min guard       General transfer comment: reminders for hand placement or not reaching back  Ambulation/Gait Ambulation/Gait assistance: Min guard;Min assist Ambulation Distance (Feet): 7 Feet Assistive device: Rolling walker (2 wheeled);1 person hand held assist Gait Pattern/deviations: Step-to pattern;Shuffle;Trunk flexed;Narrow base of support Gait velocity: reduced Gait velocity interpretation: Below normal speed for age/gender General Gait Details: weak and faltering at the end  Stairs            Wheelchair Mobility    Modified Rankin (Stroke Patients Only)       Balance Overall balance assessment: Needs assistance Sitting-balance support: Feet supported Sitting balance-Leahy Scale: Fair   Postural control:  Posterior lean Standing balance support: Bilateral upper extremity supported Standing balance-Leahy Scale: Poor                               Pertinent Vitals/Pain Pain Assessment: No/denies pain    Home Living Family/patient expects to be discharged to:: Other (Comment) ([place] at [facility]) Living Arrangements: Other (Comment) (staff for housekeeping and meals) Available Help at Discharge: Available 24 hours/day;Other (Comment) (staff of [facility]) Type of Home: Independent living facility Home Access: Level entry     Home Layout: One level Home Equipment: Walker - 4 wheels;Shower seat;Bedside commode Additional Comments: has been able to walk to meals prior to her admission    Prior Function Level of Independence: Independent with assistive device(s)               Hand Dominance        Extremity/Trunk Assessment   Upper Extremity Assessment: Generalized weakness           Lower Extremity Assessment: Generalized weakness      Cervical / Trunk Assessment: Kyphotic  Communication   Communication: No difficulties  Cognition Arousal/Alertness: Awake/alert Behavior During Therapy: WFL for tasks assessed/performed Overall Cognitive Status: Within Functional Limits for tasks assessed       Memory: Decreased short-term memory;Decreased recall of precautions              General Comments General comments (skin integrity, edema, etc.): Pt has been reporting struggling with mobility a bit prior to admission but is not really  planning SNF.  Talked with her based on pt's report her IL will not assist her.    Exercises        Assessment/Plan    PT Assessment Patient needs continued PT services  PT Diagnosis Abnormality of gait;Generalized weakness   PT Problem List Decreased strength;Decreased range of motion;Decreased activity tolerance;Decreased balance;Decreased mobility;Decreased coordination;Decreased cognition;Decreased knowledge of  use of DME;Decreased safety awareness;Decreased knowledge of precautions;Cardiopulmonary status limiting activity  PT Treatment Interventions DME instruction;Gait training;Functional mobility training;Therapeutic activities;Therapeutic exercise;Neuromuscular re-education;Balance training;Patient/family education   PT Goals (Current goals can be found in the Care Plan section) Acute Rehab PT Goals Patient Stated Goal: to get stronger and get home PT Goal Formulation: With patient Time For Goal Achievement: 03/27/15 Potential to Achieve Goals: Good    Frequency Min 2X/week   Barriers to discharge Decreased caregiver support [place] arrangment     Co-evaluation               End of Session Equipment Utilized During Treatment: Gait belt Activity Tolerance: Patient tolerated treatment well;Patient limited by fatigue Patient left: in chair;with call bell/phone within reach;with chair alarm set Nurse Communication: Mobility status         Time: 4580-9983 PT Time Calculation (min) (ACUTE ONLY): 28 min   Charges:   PT Evaluation $Initial PT Evaluation Tier I: 1 Procedure PT Treatments $Gait Training: 8-22 mins   PT G CodesIvar Drape Apr 04, 2015, 11:52 AM   Samul Dada, PT MS Acute Rehab Dept. Number: [id number] and [facility] [phone]

## 2015-03-13 NOTE — Clinical Social Work Note (Signed)
Clinical Social Work Assessment  Patient Details  Name: Joanna Roberts MRN: [id number] Date of Birth: January 26, 1929  Date of referral:  03/13/15               Reason for consult:  Facility Placement                Permission sought to share information with:  Facility Art therapist granted to share information::  No  Name::        Agency::     Relationship::     Contact Information:     Housing/Transportation Living arrangements for the past 2 months:  [place] [facility] ) Source of Information:  Patient Patient Interpreter Needed:  None Criminal Activity/Legal Involvement Pertinent to Current Situation/Hospitalization:  No - Comment as needed Significant Relationships:  Adult Children Lives with:  Self (Independent Living Resident at [facility] ) Do you feel safe going back to the place where you live?  Yes Need for family participation in patient care:  No (Coment)  Care giving concerns:  Patient is an independent living resident at [facility].    Social Worker assessment / plan:  Holiday representative (CSW) met with patient to discuss D/C plan. PT is recommending SNF. Patient was alert and oriented and sitting in the chair. CSW introduced self and explained role of CSW department. Patient reported that she lives at [place] alone. Patient reported that her daughter Margaretha Sheffield is her primary support. Patient reported that she uses a rolaider for mobility assistance but does not move around much. Patient reported that she can get to the bathroom and to meals in the dining room. Patient reported that when she can't make it to the dining room she pays $5 per meal for delivery. CSW explained that PT is recommending SNF. Patient adamantly refused SNF and home health PT. Patient reported that she cannot participate in PT because she has "CHF, which makes her very tired." CSW encouraged patient to consider going to rehab however she  continues to refuse. Patient gave CSW permission to call her daughter. CSW left patient's daughter Margaretha Sheffield a Advertising account executive. CSW will continue to follow and assist as needed.   Employment status:  Retired Nurse, adult PT Recommendations:  [place] / Referral to community resources:  [place], Other (Comment Required) ([place] )  Patient/Family's Response to care:  PT is recommending SNF however patient is refusing SNF.   Patient/Family's Understanding of and Emotional Response to Diagnosis, Current Treatment, and Prognosis:  Patient was pleasant throughout assessment.   Emotional Assessment Appearance:  Appears stated age Attitude/Demeanor/Rapport:    Affect (typically observed):  Accepting, Adaptable, Pleasant Orientation:  Oriented to Self, Oriented to Place, Oriented to  Time, Oriented to Situation Alcohol / Substance use:  Not Applicable Psych involvement (Current and /or in the community):  No (Comment)  Discharge Needs  Concerns to be addressed:  Discharge Planning Concerns Readmission within the last 30 days:  No Current discharge risk:  Dependent with Mobility Barriers to Discharge:  Continued Medical Work up   Loralyn Freshwater, LCSW 03/13/2015, 4:19 PM

## 2015-03-14 LAB — BASIC METABOLIC PANEL
Anion gap: 4 — ABNORMAL LOW (ref 5–15)
BUN: 20 mg/dL (ref 6–20)
CALCIUM: 8.4 mg/dL — AB (ref 8.9–10.3)
CO2: 27 mmol/L (ref 22–32)
Chloride: 101 mmol/L (ref 101–111)
Creatinine, Ser: 0.97 mg/dL (ref 0.44–1.00)
GFR calc Af Amer: >60 mL/min (ref >60)
GFR, EST NON AFRICAN AMERICAN: 52 mL/min — AB (ref >60)
GLUCOSE: 119 mg/dL — AB (ref 65–99)
POTASSIUM: 4.9 mmol/L (ref 3.5–5.1)
Sodium: 132 mmol/L — ABNORMAL LOW (ref 135–145)

## 2015-03-14 LAB — URINE CULTURE

## 2015-03-14 LAB — CULTURE, RESPIRATORY

## 2015-03-14 LAB — PROTIME-INR
INR: 2.54
PROTHROMBIN TIME: 27 s — AB (ref 11.4–15.0)

## 2015-03-14 LAB — CULTURE, RESPIRATORY W GRAM STAIN: Culture: NORMAL

## 2015-03-14 LAB — LEGIONELLA PNEUMOPHILA SEROGP 1 UR AG: L. PNEUMOPHILA SEROGP 1 UR AG: NEGATIVE

## 2015-03-14 MED ORDER — LEVOFLOXACIN 750 MG PO TABS
750.0000 mg | ORAL_TABLET | ORAL | Status: DC
  Administered 2015-03-15 – 2015-03-17 (×2): 750 mg via ORAL
  Filled 2015-03-14 (×2): qty 1

## 2015-03-14 MED ORDER — WARFARIN 0.5 MG HALF TABLET
0.5000 mg | ORAL_TABLET | Freq: Once | ORAL | Status: AC
  Administered 2015-03-14: 0.5 mg via ORAL
  Filled 2015-03-14: qty 1

## 2015-03-14 MED ORDER — SENNA 8.6 MG PO TABS
1.0000 | ORAL_TABLET | Freq: Every day | ORAL | Status: DC
  Administered 2015-03-14 – 2015-03-18 (×5): 8.6 mg via ORAL
  Filled 2015-03-14 (×5): qty 1

## 2015-03-14 NOTE — Progress Notes (Signed)
D/C telemetry monitor per Dr. Sheryle Hail.

## 2015-03-14 NOTE — Progress Notes (Signed)
ANTICOAGULATION CONSULT NOTE - Follow Up  Pharmacy Consult for Coumadin Indication: atrial fibrillation  Allergies  Allergen Reactions  . Macrobid [Nitrofurantoin Monohyd Macro] Diarrhea and Nausea And Vomiting  . Lortab [Hydrocodone-Acetaminophen] Itching, Swelling and Rash    Patient Measurements: Height: 5' (152.4 cm) Weight: 157 lb 5.1 oz (71.36 kg) IBW/kg (Calculated) : 45.5  Vital Signs: Temp: 98.3 F (36.8 C) (11/18 0659) Temp Source: Oral (11/18 0659) BP: 113/73 mmHg (11/18 0659) Pulse Rate: 75 (11/18 0659)  Labs:  Recent Labs  03/11/15 1135 03/11/15 1657  03/12/15 0541 03/13/15 0438 03/14/15 0419  HGB 9.8* 9.4*  --  9.7*  --   --   HCT 30.9* 28.8*  --  28.8*  --   --   PLT 152 138*  --  144*  --   --   LABPROT  --  44.8*  < > 38.8* 29.2* 27.0*  INR  --  4.98*  < > 4.11* 2.82 2.54  CREATININE 1.17* 0.98  --  0.89 0.87 0.97  < > = values in this interval not displayed.  Estimated Creatinine Clearance: 38.1 mL/min (by C-G formula based on Cr of 0.97).  Assessment: 81 y/o F with a h/o CAF on Coumadin 1.5 mg daily PTA.    INR History: 11/15: INR - 4.98, warfarin held 11/16: INR - 4.11, warfarin held 11/17: INR - 2.82, warfarin 0.5 mg  INR is therapeutic today at 2.54.  Goal of Therapy:  INR 2-3   Plan:  INR therapeutic today at 2.54. INR lower than yesterday due to doses being held on 11/15 and 11/16. Patient still on levofloxacin for pneumonia which can enhance the anticoagulation effects of warfarin.  Will give another 0.5 mg warfarin dose tonight  INR in AM for monitoring.  Pharmacy will continue to follow.   Jodelle Red Couper [place] 03/14/2015,9:46 AM

## 2015-03-14 NOTE — Plan of Care (Signed)
Problem: Spiritual Needs Goal: Ability to function at adequate level Outcome: Completed/Met Date Met:  03/14/15 Pt talkative. verbalizes needs and understands her recent depression. Makes decisions for herself  Problem: Education: Goal: Knowledge of [place] [facility] Education information/materials will improve Outcome: Completed/Met Date Met:  03/14/15 General care needs met. Understands all at this time  Problem: Activity: Goal: Ability to tolerate increased activity will improve Outcome: Progressing ambulating 1+ assist with walker  Problem: Education: Goal: Ability to identify and alter actions that are detrimental to health will improve Outcome: Progressing Daughter has concerns about the conditions and decisions that pt is making.   Problem: Health Behavior/Discharge Planning: Goal: Ability to manage health-related needs will improve Outcome: Progressing Pt daughter is concerned about the decisions that pt is making for herself  Problem: Physical Regulation: Goal: Ability to maintain a body temperature in the normal range will improve Outcome: Completed/Met Date Met:  03/14/15 afebrile  Problem: Respiratory: Goal: Ability to maintain a clear airway will improve Outcome: Completed/Met Date Met:  03/14/15 Pt on room air Goal: Pain level will decrease Outcome: Progressing Pain controlled with po pain meds  Problem: Pain Management: Goal: Expressions of feelings of enhanced comfort will increase Outcome: Progressing tolerating and requesting po pain meds

## 2015-03-14 NOTE — Progress Notes (Addendum)
[facility] Physicians - [place] at [facility]   PATIENT NAME: Joanna Roberts    MR#:  [id number]  DATE OF BIRTH:  Dec 29, 1928  SUBJECTIVE:  CHIEF COMPLAINT:   Chief Complaint  Patient presents with  . Cough  . Shortness of Breath   still cough and shortness of breath. But still weakness.  REVIEW OF SYSTEMS:  CONSTITUTIONAL: Has fever, has generalized weakness.  EYES: No blurred or double vision.  EARS, NOSE, AND THROAT: No tinnitus or ear pain.  RESPIRATORY: Has cough and shortness of breath, no wheezing or hemoptysis.  CARDIOVASCULAR: No chest pain, orthopnea, edema.  GASTROINTESTINAL: No nausea, vomiting, diarrhea or abdominal pain.  GENITOURINARY: No dysuria, hematuria.  ENDOCRINE: No polyuria, nocturia,  HEMATOLOGY: No anemia, easy bruising or bleeding SKIN: No rash or lesion. MUSCULOSKELETAL: No joint pain or arthritis.   NEUROLOGIC: No tingling, numbness, weakness.  PSYCHIATRY: No anxiety or depression.   DRUG ALLERGIES:   Allergies  Allergen Reactions  . Macrobid [Nitrofurantoin Monohyd Macro] Diarrhea and Nausea And Vomiting  . Lortab [Hydrocodone-Acetaminophen] Itching, Swelling and Rash    VITALS:  Blood pressure 113/73, pulse 75, temperature 98.3 F (36.8 C), temperature source Oral, resp. rate 16, height 5' (1.524 m), weight 71.36 kg (157 lb 5.1 oz), SpO2 96 %.  PHYSICAL EXAMINATION:  GENERAL:  81 y.o.-year-old patient sitting in chair with no acute distress.  EYES: Pupils equal, round, reactive to light and accommodation. No scleral icterus. Extraocular muscles intact.  HEENT: Head atraumatic, normocephalic. Oropharynx and nasopharynx clear.  NECK:  Supple, no jugular venous distention. No thyroid enlargement, no tenderness.  LUNGS: Normal breath sounds bilaterally, no wheezing, crackles on the right side. No use of accessory muscles of respiration.  CARDIOVASCULAR: S1, S2 normal. No murmurs, rubs, or gallops.  ABDOMEN: Soft, nontender,  nondistended. Bowel sounds present. No organomegaly or mass.  EXTREMITIES: No pedal edema, cyanosis, or clubbing.  NEUROLOGIC: Cranial nerves II through XII are intact. Muscle strength 4/5 in all extremities. Sensation intact. Gait not checked.  PSYCHIATRIC: The patient is alert and oriented x 3.  SKIN: No obvious rash, lesion, or ulcer.    LABORATORY PANEL:   CBC  Recent Labs Lab 03/12/15 0541  WBC 10.2  HGB 9.7*  HCT 28.8*  PLT 144*   ------------------------------------------------------------------------------------------------------------------  Chemistries   Recent Labs Lab 03/13/15 0438 03/14/15 0419  NA 132* 132*  K 4.1 4.9  CL 103 101  CO2 23 27  GLUCOSE 117* 119*  BUN 18 20  CREATININE 0.87 0.97  CALCIUM 8.6* 8.4*  MG 1.7  --    ------------------------------------------------------------------------------------------------------------------  Cardiac Enzymes No results for input(s): TROPONINI in the last 168 hours. ------------------------------------------------------------------------------------------------------------------  RADIOLOGY:  No results found.  EKG:   Orders placed or performed during the hospital encounter of 03/11/15  . ED EKG  . ED EKG  . EKG 12-Lead  . EKG 12-Lead    ASSESSMENT AND PLAN:   1 sepsis secondary to right-sided pneumonia Continue levofloxacin, follow-up blood cultures (negative so far) and sputum cultures ( normal flora),  Pending Legionella antigen, streptococcal antigen Nebulizer treatments if needed  2. Hyponatremia likely 2/2 hypovolemic hyponatremia from poor by mouth intake. Improving. Discontinued NS IV and follow-up BMP.  Hold hydrochlorothiazide and Lasix.  3. Chronic atrial fibrillation, rate controlled, was on Coumadin INR was 4.11, down to 2.54, resumed Coumadin PTD. Continue Coreg.  4. Chronic history of congestive heart failure-ejection fraction unknown Stable. hold hydrochlorothiazide and  Lasix  due to hyponatremia  5.  Essential hypertension. Controlled. continue Coreg and Cozaar, but hold off on the Lasix and hydrochlorothiazide.     Dysphagia. Dysphagia 3 diet per Swallowing study.  PT evaluation, the patient needs skilled nursing facility placement. But the patient refused and home health and PT.  She agrees to home health and PT today.  All the records are reviewed and case discussed with Care Management/Social Workerr. Management plans discussed with the patient, called and discussed with her daughter and they are in agreement. Greater than 50% time was spent on coordination of care and face-to-face counseling.  CODE STATUS: Full code  TOTAL TIME TAKING CARE OF THIS PATIENT: 39 minutes.   POSSIBLE D/C IN 2 DAYS, DEPENDING ON CLINICAL CONDITION.   Shaune Pollack M.D on 03/14/2015 at 2:04 PM  Between 7am to 6pm - Pager - [phone]  After 6pm go to [email or web address] - password [email or web address] [facility]  [place] [facility]  Office  [phone]  CC: Primary care physician; Marisue Ivan, MD

## 2015-03-14 NOTE — Progress Notes (Signed)
Clinical Education officer, museum (CSW) received call from patient's daughter Margaretha Sheffield today. Per daughter patient is "stubborn" and usually refuses PT. Daughter requested patient to have [facility]. Per daughter she will pick patient up from the hospital when she is ready for D/C. Daughter can be reached at [phone].   CSW met with patient to discuss D/C plan. Patient continues to refuse SNF and reported that she is going home. CSW will continue to follow and assist as needed.   Blima Rich, [place] [phone]

## 2015-03-14 NOTE — Progress Notes (Signed)
ANTIBIOTIC CONSULT NOTE - FOLLOW UP  Pharmacy Consult for levofloxacin Indication: pneumonia  Allergies  Allergen Reactions  . Macrobid [Nitrofurantoin Monohyd Macro] Diarrhea and Nausea And Vomiting  . Lortab [Hydrocodone-Acetaminophen] Itching, Swelling and Rash    Patient Measurements: Height: 5' (152.4 cm) Weight: 157 lb 5.1 oz (71.36 kg) IBW/kg (Calculated) : 45.5  Vital Signs: Temp: 98.3 F (36.8 C) (11/18 0659) Temp Source: Oral (11/18 0659) BP: 113/73 mmHg (11/18 0659) Pulse Rate: 75 (11/18 0659) Intake/Output from previous day: 11/17 0701 - 11/18 0700 In: 240 [P.O.:240] Out: 150 [Urine:150] Intake/Output from this shift:    Labs:  Recent Labs  03/11/15 1657 03/12/15 0541 03/13/15 0438 03/14/15 0419  WBC 10.5 10.2  --   --   HGB 9.4* 9.7*  --   --   PLT 138* 144*  --   --   CREATININE 0.98 0.89 0.87 0.97   Estimated Creatinine Clearance: 38.1 mL/min (by C-G formula based on Cr of 0.97). No results for input(s): VANCOTROUGH, VANCOPEAK, VANCORANDOM, GENTTROUGH, GENTPEAK, GENTRANDOM, TOBRATROUGH, TOBRAPEAK, TOBRARND, AMIKACINPEAK, AMIKACINTROU, AMIKACIN in the last 72 hours.   Microbiology: Recent Results (from the past 720 hour(s))  Blood culture (routine x 2)     Status: None (Preliminary result)   Collection Time: 03/11/15  1:18 PM  Result Value Ref Range Status   Specimen Description BLOOD LEFT ASSIST CONTROL  Final   Special Requests BOTTLES DRAWN AEROBIC AND ANAEROBIC 4CC  Final   Culture NO GROWTH 3 DAYS  Final   Report Status PENDING  Incomplete  Blood culture (routine x 2)     Status: None (Preliminary result)   Collection Time: 03/11/15  1:30 PM  Result Value Ref Range Status   Specimen Description BLOOD RIGHT THUMB  Final   Special Requests BOTTLES DRAWN AEROBIC AND ANAEROBIC 4CC  Final   Culture NO GROWTH 3 DAYS  Final   Report Status PENDING  Incomplete  Culture, sputum-assessment     Status: None   Collection Time: 03/11/15 10:07 PM   Result Value Ref Range Status   Specimen Description SPUTUM  Final   Special Requests NONE  Final   Sputum evaluation THIS SPECIMEN IS ACCEPTABLE FOR SPUTUM CULTURE  Final   Report Status 03/12/2015 FINAL  Final  Culture, respiratory (NON-Expectorated)     Status: None   Collection Time: 03/11/15 10:07 PM  Result Value Ref Range Status   Specimen Description SPUTUM  Final   Special Requests NONE Reflexed from [id number]  Final   Gram Stain   Final    MANY WBC SEEN FEW GRAM POSITIVE COCCI FEW GRAM NEGATIVE COCCOBACILLI EXCELLENT SPECIMEN - 90-100% WBCS    Culture Consistent with normal respiratory flora.  Final   Report Status 03/14/2015 FINAL  Final  Urine culture     Status: None   Collection Time: 03/12/15  6:43 PM  Result Value Ref Range Status   Specimen Description URINE, CLEAN CATCH  Final   Special Requests NONE  Final   Culture INSIGNIFICANT GROWTH  Final   Report Status 03/14/2015 FINAL  Final    Anti-infectives    Start     Dose/Rate Route Frequency Ordered Stop   03/15/15 1400  levofloxacin (LEVAQUIN) tablet 750 mg     750 mg Oral Every 48 hours 03/14/15 1308     03/13/15 1400  levofloxacin (LEVAQUIN) IVPB 750 mg  Status:  Discontinued     750 mg 100 mL/hr over 90 Minutes Intravenous Every 48 hours 03/11/15 1510 03/14/15  1308   03/11/15 1300  levofloxacin (LEVAQUIN) IVPB 750 mg     750 mg 100 mL/hr over 90 Minutes Intravenous  Once 03/11/15 1257 03/11/15 1546      Assessment: Pharmacy is dosing levofloxacin in this 81 year old female being treated for CAP.   Day #4 antibiotics on levofloxacin 750 mg IV q48 hours   Patient meets all of the following criteria for a change from IV to PO antibiotics:  WBC 10.2  Afebrile > 24 hours  Taking other PO medications  Ate 80% of meals within the past 24 hours  Plan:  Changed from IV to PO antibiotics per protocol Ordered levofloxacin 750 mg PO q 48 hours based on renal function and indication. Pharmacy will  continue to monitor.   Jodelle Red Lonie Rummell 03/14/2015,1:09 PM

## 2015-03-14 NOTE — Care Management (Signed)
Notified by CSW that patient's daughter requested [facility] home health for this patient but patient did not agree. She refused home health- said she was too weak and "it wasn't for her". Patient does not meet criteria for [facility] however another home health agency could have been arranged. Patient is alert and oriented and has capacity to refused home health. No further RNCM needs.

## 2015-03-15 LAB — CBC WITH DIFFERENTIAL/PLATELET
Band Neutrophils: 3 %
Basophils Absolute: 0.1 10*3/uL (ref 0–0.1)
Basophils Relative: 1 %
EOS ABS: 0.2 10*3/uL (ref 0–0.7)
Eosinophils Relative: 2 %
HCT: 27.7 % — ABNORMAL LOW (ref 35.0–47.0)
Hemoglobin: 8.8 g/dL — ABNORMAL LOW (ref 12.0–16.0)
LYMPHS ABS: 1.3 10*3/uL (ref 1.0–3.6)
LYMPHS PCT: 15 %
MCH: 30 pg (ref 26.0–34.0)
MCHC: 32 g/dL (ref 32.0–36.0)
MCV: 93.9 fL (ref 80.0–100.0)
MONO ABS: 0.6 10*3/uL (ref 0.2–0.9)
Metamyelocytes Relative: 2 %
Monocytes Relative: 7 %
Myelocytes: 1 %
NEUTROS ABS: 6.2 10*3/uL (ref 1.4–6.5)
NEUTROS PCT: 69 %
Platelets: 175 10*3/uL (ref 150–440)
RBC: 2.95 MIL/uL — AB (ref 3.80–5.20)
RDW: 14.5 % (ref 11.5–14.5)
WBC: 8.4 10*3/uL (ref 3.6–11.0)

## 2015-03-15 LAB — OCCULT BLOOD X 1 CARD TO LAB, STOOL: FECAL OCCULT BLD: POSITIVE — AB

## 2015-03-15 LAB — PROTIME-INR
INR: 2.28
PROTHROMBIN TIME: 24.9 s — AB (ref 11.4–15.0)

## 2015-03-15 MED ORDER — BISACODYL 10 MG RE SUPP
10.0000 mg | Freq: Every day | RECTAL | Status: DC | PRN
  Administered 2015-03-15: 10 mg via RECTAL
  Filled 2015-03-15: qty 1

## 2015-03-15 MED ORDER — FUROSEMIDE 20 MG PO TABS
20.0000 mg | ORAL_TABLET | Freq: Every day | ORAL | Status: DC
  Administered 2015-03-15 – 2015-03-16 (×2): 20 mg via ORAL
  Filled 2015-03-15 (×2): qty 1

## 2015-03-15 MED ORDER — PREDNISONE 50 MG PO TABS
50.0000 mg | ORAL_TABLET | Freq: Every day | ORAL | Status: DC
  Administered 2015-03-15 – 2015-03-18 (×4): 50 mg via ORAL
  Filled 2015-03-15 (×4): qty 1

## 2015-03-15 MED ORDER — WARFARIN SODIUM 2 MG PO TABS
1.0000 mg | ORAL_TABLET | Freq: Every day | ORAL | Status: DC
  Administered 2015-03-15 – 2015-03-17 (×3): 1 mg via ORAL
  Filled 2015-03-15 (×4): qty 1

## 2015-03-15 NOTE — Progress Notes (Signed)
ANTICOAGULATION CONSULT NOTE - Follow Up  Pharmacy Consult for Coumadin Indication: atrial fibrillation  Allergies  Allergen Reactions  . Macrobid [Nitrofurantoin Monohyd Macro] Diarrhea and Nausea And Vomiting  . Lortab [Hydrocodone-Acetaminophen] Itching, Swelling and Rash    Patient Measurements: Height: 5' (152.4 cm) Weight: 158 lb 13.1 oz (72.04 kg) IBW/kg (Calculated) : 45.5  Vital Signs: Temp: 97.6 F (36.4 C) (11/19 0350) Temp Source: Oral (11/19 0350) BP: 134/77 mmHg (11/19 0723) Pulse Rate: 72 (11/19 0723)  Labs:  Recent Labs  03/13/15 0438 03/14/15 0419 03/15/15 0403  HGB  --   --  8.8*  HCT  --   --  27.7*  PLT  --   --  175  LABPROT 29.2* 27.0* 24.9*  INR 2.82 2.54 2.28  CREATININE 0.87 0.97  --     Estimated Creatinine Clearance: 38.2 mL/min (by C-G formula based on Cr of 0.97).  Assessment: 81 y/o F with a h/o CAF on Coumadin 1.5 mg daily PTA.    INR History: 11/15: INR - 4.98, warfarin held 11/16: INR - 4.11, warfarin held 11/17: INR - 2.82, warfarin 0.5 mg 11/18: INR - 2.54 warfarin 0.5 mg 11/19: INR - 2.28  Goal of Therapy:  INR 2-3   Plan:  11/18:INR therapeutic today at 2.54. INR lower than yesterday due to doses being held on 11/15 and 11/16. Patient still on levofloxacin for pneumonia which can enhance the anticoagulation effects of warfarin. Will give another 0.5 mg warfarin dose tonight  11/19: INR trending down. Will change to Warfarin 1 mg daily. Patient on Levaquin.  INR in AM for monitoring.  Pharmacy will continue to follow.   Bari Mantis PharmD Clinical Pharmacist 03/15/2015 10:53 AM

## 2015-03-15 NOTE — Progress Notes (Signed)
Pt with contipation for 2 days. No bm. hgb dropping from 9.7 to 8.8. md orders dulcolax supp qd prn and  Send stool for occult blood

## 2015-03-15 NOTE — Progress Notes (Signed)
[facility] - [place] at [facility]   PATIENT NAME: Joanna Roberts    MR#:  [id number]  DATE OF BIRTH:  [birth year]  SUBJECTIVE:  CHIEF COMPLAINT:   Chief Complaint  Patient presents with  . Cough  . Shortness of Breath  Cough, SOB and chest congestion. Feels weak. Has chronic low back pain and weakness. Says she doesn't want PT.   REVIEW OF SYSTEMS:  CONSTITUTIONAL: Has fever, has generalized weakness.  EYES: No blurred or double vision.  EARS, NOSE, AND THROAT: No tinnitus or ear pain.  RESPIRATORY: Has cough and shortness of breath, no wheezing or hemoptysis.  CARDIOVASCULAR: No chest pain, orthopnea, edema.  GASTROINTESTINAL: No nausea, vomiting, diarrhea or abdominal pain.  GENITOURINARY: No dysuria, hematuria.  ENDOCRINE: No polyuria, nocturia,  HEMATOLOGY: No anemia, easy bruising or bleeding SKIN: No rash or lesion. MUSCULOSKELETAL: No joint pain or arthritis.   NEUROLOGIC: No tingling, numbness, weakness.  PSYCHIATRY: No anxiety or depression.   DRUG ALLERGIES:   Allergies  Allergen Reactions  . Macrobid [Nitrofurantoin Monohyd Macro] Diarrhea and Nausea And Vomiting  . Lortab [Hydrocodone-Acetaminophen] Itching, Swelling and Rash    VITALS:  Blood pressure 134/77, pulse 72, temperature 97.6 F (36.4 C), temperature source Oral, resp. rate 20, height 5' (1.524 m), weight 72.04 kg (158 lb 13.1 oz), SpO2 96 %.  PHYSICAL EXAMINATION:  GENERAL:  81 y.o.-year-old patient sitting in chair with no acute distress.  EYES: Pupils equal, round, reactive to light and accommodation. No scleral icterus. Extraocular muscles intact.  HEENT: Head atraumatic, normocephalic. Oropharynx and nasopharynx clear.  NECK:  Supple, no jugular venous distention. No thyroid enlargement, no tenderness.  LUNGS: Wheezing b/l. Decreased air entry. CARDIOVASCULAR: S1, S2 normal. No murmurs, rubs, or gallops.  ABDOMEN: Soft, nontender, nondistended. Bowel sounds present.  No organomegaly or mass.  EXTREMITIES: No pedal edema, cyanosis, or clubbing.  NEUROLOGIC: Cranial nerves II through XII are intact. Muscle strength 4/5 in all extremities. Sensation intact. Gait not checked.  PSYCHIATRIC: The patient is alert and oriented x 3.  SKIN: No obvious rash, lesion, or ulcer.    LABORATORY PANEL:   CBC  Recent Labs Lab 03/15/15 0403  WBC 8.4  HGB 8.8*  HCT 27.7*  PLT 175   ------------------------------------------------------------------------------------------------------------------  Chemistries   Recent Labs Lab 03/13/15 0438 03/14/15 0419  NA 132* 132*  K 4.1 4.9  CL 103 101  CO2 23 27  GLUCOSE 117* 119*  BUN 18 20  CREATININE 0.87 0.97  CALCIUM 8.6* 8.4*  MG 1.7  --    ------------------------------------------------------------------------------------------------------------------  Cardiac Enzymes No results for input(s): TROPONINI in the last 168 hours. ------------------------------------------------------------------------------------------------------------------  RADIOLOGY:  No results found.  EKG:   Orders placed or performed during the hospital encounter of 03/11/15  . ED EKG  . ED EKG  . EKG 12-Lead  . EKG 12-Lead    ASSESSMENT AND PLAN:   # sepsis due to bilateral pneumonia Continue levofloxacin, follow-up blood cultures (negative so far) and sputum cultures ( normal flora) Strep and Legionella abs negative Nebulizer treatments as needed  # Hyponatremia likely 2/2 hypovolemic hyponatremia from poor by mouth intake. Improving. Discontinued NS IV and follow-up BMP.  Hold hydrochlorothiazide.  * Chronic anemia with slow worsening Likely due to Internal hemmorhoids  # Chronic atrial fibrillation, rate controlled, was on Coumadin INR was 4.11, down to 2.54, resumed Coumadin PTD. Continue Coreg.  # Chronic history of congestive heart failure-ejection fraction unknown Stable. hold hydrochlorothiazide and  Lasix  due to hyponatremia  # Essential hypertension. Controlled. continue Coreg and Cozaar.  # Dysphagia. Dysphagia 3 diet per Swallowing study.  PT evaluation, the patient needs skilled nursing facility placement. But the patient refused and home health and PT.  She agrees to home health and PT today.  All the records are reviewed and case discussed with Care Management/Social Workerr. Management plans discussed with the patien.  CODE STATUS: Full code  TOTAL TIME TAKING CARE OF THIS PATIENT: 39 minutes.   SNF recommended. Patient doesn't want SNF. Feels too weak to go home. Will need home health, Possible d/c in AM   Milagros Loll R M.D on 03/15/2015 at 12:59 PM  Between 7am to 6pm - Pager - [phone]  After 6pm go to [email or web address] - password [email or web address]  [place] [facility]  Office  [phone]  CC: Primary care physician; Marisue Ivan, MD

## 2015-03-16 ENCOUNTER — Inpatient Hospital Stay: Payer: PPO

## 2015-03-16 LAB — CULTURE, BLOOD (ROUTINE X 2)
CULTURE: NO GROWTH
Culture: NO GROWTH

## 2015-03-16 LAB — BASIC METABOLIC PANEL
ANION GAP: 7 (ref 5–15)
BUN: 18 mg/dL (ref 6–20)
CALCIUM: 9 mg/dL (ref 8.9–10.3)
CO2: 28 mmol/L (ref 22–32)
Chloride: 97 mmol/L — ABNORMAL LOW (ref 101–111)
Creatinine, Ser: 0.8 mg/dL (ref 0.44–1.00)
Glucose, Bld: 175 mg/dL — ABNORMAL HIGH (ref 65–99)
Potassium: 4.9 mmol/L (ref 3.5–5.1)
Sodium: 132 mmol/L — ABNORMAL LOW (ref 135–145)

## 2015-03-16 LAB — CBC WITH DIFFERENTIAL/PLATELET
BASOS ABS: 0 10*3/uL (ref 0–0.1)
Eosinophils Absolute: 0 10*3/uL (ref 0–0.7)
Eosinophils Relative: 0 %
HCT: 30.2 % — ABNORMAL LOW (ref 35.0–47.0)
HEMOGLOBIN: 10.1 g/dL — AB (ref 12.0–16.0)
Lymphocytes Relative: 4 %
Lymphs Abs: 0.3 10*3/uL — ABNORMAL LOW (ref 1.0–3.6)
MCH: 30.7 pg (ref 26.0–34.0)
MCHC: 33.3 g/dL (ref 32.0–36.0)
MCV: 92.3 fL (ref 80.0–100.0)
Monocytes Absolute: 0.1 10*3/uL — ABNORMAL LOW (ref 0.2–0.9)
Monocytes Relative: 1 %
NEUTROS ABS: 8.5 10*3/uL — AB (ref 1.4–6.5)
Platelets: 204 10*3/uL (ref 150–440)
RBC: 3.28 MIL/uL — AB (ref 3.80–5.20)
RDW: 14.6 % — ABNORMAL HIGH (ref 11.5–14.5)
WBC: 8.9 10*3/uL (ref 3.6–11.0)

## 2015-03-16 LAB — PROTIME-INR
INR: 2.09
PROTHROMBIN TIME: 23.3 s — AB (ref 11.4–15.0)

## 2015-03-16 MED ORDER — FUROSEMIDE 40 MG PO TABS
40.0000 mg | ORAL_TABLET | Freq: Two times a day (BID) | ORAL | Status: DC
  Administered 2015-03-16 – 2015-03-18 (×5): 40 mg via ORAL
  Filled 2015-03-16 (×5): qty 1

## 2015-03-16 NOTE — Progress Notes (Signed)
[facility] - [place] at [facility]   PATIENT NAME: Joanna Roberts    MR#:  [id number]  DATE OF BIRTH:  [birth year]  SUBJECTIVE:  CHIEF COMPLAINT:   Chief Complaint  Patient presents with  . Cough  . Shortness of Breath  Cough, SOB and chest congestion. Feels weak. Right lower chest pain on coughing  REVIEW OF SYSTEMS:  CONSTITUTIONAL: Has fever, has generalized weakness.  EYES: No blurred or double vision.  EARS, NOSE, AND THROAT: No tinnitus or ear pain.  RESPIRATORY: Has cough and shortness of breath, no wheezing or hemoptysis.  CARDIOVASCULAR: No chest pain, orthopnea, edema.  GASTROINTESTINAL: No nausea, vomiting, diarrhea or abdominal pain.  GENITOURINARY: No dysuria, hematuria.  ENDOCRINE: No polyuria, nocturia,  HEMATOLOGY: No anemia, easy bruising or bleeding SKIN: No rash or lesion. MUSCULOSKELETAL: No joint pain or arthritis.   NEUROLOGIC: No tingling, numbness, weakness.  PSYCHIATRY: No anxiety or depression.   DRUG ALLERGIES:   Allergies  Allergen Reactions  . Macrobid [Nitrofurantoin Monohyd Macro] Diarrhea and Nausea And Vomiting  . Lortab [Hydrocodone-Acetaminophen] Itching, Swelling and Rash    VITALS:  Blood pressure 174/90, pulse 80, temperature 98.5 F (36.9 C), temperature source Oral, resp. rate 18, height 5' (1.524 m), weight 71.88 kg (158 lb 7.5 oz), SpO2 94 %.  PHYSICAL EXAMINATION:  GENERAL:  81 y.o.-year-old patient sitting in chair with no acute distress.  EYES: Pupils equal, round, reactive to light and accommodation. No scleral icterus. Extraocular muscles intact.  HEENT: Head atraumatic, normocephalic. Oropharynx and nasopharynx clear.  NECK:  Supple, no jugular venous distention. No thyroid enlargement, no tenderness.  LUNGS: Wheezing b/l. Decreased air entry. CARDIOVASCULAR: S1, S2 normal. No murmurs, rubs, or gallops.  ABDOMEN: Soft, nontender, nondistended. Bowel sounds present. No organomegaly or mass.   EXTREMITIES: No pedal edema, cyanosis, or clubbing.  NEUROLOGIC: Cranial nerves II through XII are intact. Muscle strength 4/5 in all extremities. Sensation intact. Gait not checked.  PSYCHIATRIC: The patient is alert and oriented x 3.  SKIN: No obvious rash, lesion, or ulcer.    LABORATORY PANEL:   CBC  Recent Labs Lab 03/16/15 0329  WBC 8.9  HGB 10.1*  HCT 30.2*  PLT 204   ------------------------------------------------------------------------------------------------------------------  Chemistries   Recent Labs Lab 03/13/15 0438  03/16/15 0329  NA 132*  < > 132*  K 4.1  < > 4.9  CL 103  < > 97*  CO2 23  < > 28  GLUCOSE 117*  < > 175*  BUN 18  < > 18  CREATININE 0.87  < > 0.80  CALCIUM 8.6*  < > 9.0  MG 1.7  --   --   < > = values in this interval not displayed. ------------------------------------------------------------------------------------------------------------------  Cardiac Enzymes No results for input(s): TROPONINI in the last 168 hours. ------------------------------------------------------------------------------------------------------------------  RADIOLOGY:  Dg Ribs Unilateral W/chest Right  03/16/2015  CLINICAL DATA:  Right chest pain. EXAM: RIGHT RIBS AND CHEST - 3+ VIEW COMPARISON:  03/11/2015 chest radiograph. FINDINGS: Three lead left subclavian ICD is stable in configuration with lead tips overlying the right ventricle and coronary sinus. Stable cardiomediastinal silhouette with mild cardiomegaly. No pneumothorax. Small bilateral pleural effusions, slightly increased bilaterally. No overt pulmonary edema. Patchy bibasilar lung opacities, increased bilaterally. Stable mild patchy opacity in the right upper lung. The area of symptomatic concern as indicated by the patient in the right lower chest wall was indicated by the technologist with a metallic skin BB. No fracture or suspicious focal osseous  lesion is seen in the right ribs. IMPRESSION: 1.  No fracture or focal osseous lesion is seen in the right ribs. 2. Stable mild cardiomegaly without overt pulmonary edema. 3. Small bilateral pleural effusions, slightly increased bilaterally. 4. Patchy bibasilar lung opacities, increased bilaterally, favor atelectasis. 5. Patchy opacity in the right upper lung, unchanged, nonspecific, differential includes chronic scarring or less likely infection, recommend attention on follow-up PA and lateral chest radiographs in 6-8 weeks. Electronically Signed   By: Delbert Phenix M.D.   On: 03/16/2015 10:58    EKG:   Orders placed or performed during the hospital encounter of 03/11/15  . ED EKG  . ED EKG  . EKG 12-Lead  . EKG 12-Lead    ASSESSMENT AND PLAN:   # sepsis due to bilateral pneumonia Continue levofloxacin, follow-up blood cultures (negative so far) and sputum cultures ( normal flora) Strep and Legionella abs negative Nebulizer treatments as needed  # Hyponatremia likely 2/2 hypovolemic hyponatremia from poor by mouth intake. Improving. Discontinued NS IV and follow-up BMP.  Hold hydrochlorothiazide.  * Chronic anemia with slow worsening Likely due to Internal hemmorhoids  # Chronic atrial fibrillation, rate controlled, was on Coumadin INR was 4.11, down to 2.54, resumed Coumadin PTD. Continue Coreg.  # Chronic history of congestive heart failure-ejection fraction unknown Stable. Restart Lasix  # Essential hypertension. Controlled. continue Coreg and Cozaar.  # Dysphagia. Dysphagia 3 diet per Swallowing study.  Refusing SNF. Will set up [facility] PT  All the records are reviewed and case discussed with Care Management/Social Workerr. Management plans discussed with the patien.  CODE STATUS: Full code  TOTAL TIME TAKING CARE OF THIS PATIENT: 30 minutes.  SNF recommended. Patient doesn't want SNF. Feels too weak to go home. Will need home health,  Discharge in AM   Milagros Loll R M.D on 03/16/2015 at 1:37 PM  Between 7am to  6pm - Pager - [phone]  After 6pm go to [email or web address] - password [email or web address] [facility]  [place] [facility]  Office  [phone]  CC: Primary care physician; Marisue Ivan, MD

## 2015-03-16 NOTE — Progress Notes (Signed)
ANTICOAGULATION CONSULT NOTE - Follow Up  Pharmacy Consult for Coumadin Indication: atrial fibrillation  Allergies  Allergen Reactions  . Macrobid [Nitrofurantoin Monohyd Macro] Diarrhea and Nausea And Vomiting  . Lortab [Hydrocodone-Acetaminophen] Itching, Swelling and Rash    Patient Measurements: Height: 5' (152.4 cm) Weight: 158 lb 7.5 oz (71.88 kg) IBW/kg (Calculated) : 45.5  Vital Signs: Temp: 98.5 F (36.9 C) (11/20 0816) Temp Source: Oral (11/20 0816) BP: 174/90 mmHg (11/20 0816) Pulse Rate: 80 (11/20 0816)  Labs:  Recent Labs  03/14/15 0419 03/15/15 0403 03/16/15 0329  HGB  --  8.8* 10.1*  HCT  --  27.7* 30.2*  PLT  --  175 204  LABPROT 27.0* 24.9* 23.3*  INR 2.54 2.28 2.09  CREATININE 0.97  --  0.80    Estimated Creatinine Clearance: 46.4 mL/min (by C-G formula based on Cr of 0.8).  Assessment: 81 y/o F with a h/o CAF on Coumadin 1.5 mg daily PTA.    INR History: 11/15: INR - 4.98, warfarin held 11/16: INR - 4.11, warfarin held 11/17: INR - 2.82, warfarin 0.5 mg 11/18: INR - 2.54  warfarin 0.5 mg 11/19: INR - 2.28  warfarin 1 mg 11/20: INR - 2.09    Goal of Therapy:  INR 2-3   Plan:  11/18:INR therapeutic today at 2.54. INR lower than yesterday due to doses being held on 11/15 and 11/16. Patient still on levofloxacin for pneumonia which can enhance the anticoagulation effects of warfarin. Will give another 0.5 mg warfarin dose tonight  11/19: INR trending down. Will change to Warfarin 1 mg daily. Patient on Levaquin.  11/20: Will continue Warfarin 1 mg daily. F/U INR with am labs. Patient on Levaquin.   Bari Mantis PharmD Clinical Pharmacist 03/16/2015 9:31 AM

## 2015-03-17 LAB — BASIC METABOLIC PANEL
ANION GAP: 7 (ref 5–15)
BUN: 24 mg/dL — ABNORMAL HIGH (ref 6–20)
CALCIUM: 8.8 mg/dL — AB (ref 8.9–10.3)
CHLORIDE: 94 mmol/L — AB (ref 101–111)
CO2: 32 mmol/L (ref 22–32)
Creatinine, Ser: 0.94 mg/dL (ref 0.44–1.00)
GFR calc non Af Amer: 54 mL/min — ABNORMAL LOW (ref >60)
Glucose, Bld: 131 mg/dL — ABNORMAL HIGH (ref 65–99)
POTASSIUM: 4.2 mmol/L (ref 3.5–5.1)
Sodium: 133 mmol/L — ABNORMAL LOW (ref 135–145)

## 2015-03-17 LAB — CBC WITH DIFFERENTIAL/PLATELET
BASOS ABS: 0 10*3/uL (ref 0–0.1)
Basophils Relative: 0 %
EOS PCT: 0 %
Eosinophils Absolute: 0 10*3/uL (ref 0–0.7)
HEMATOCRIT: 30.9 % — AB (ref 35.0–47.0)
HEMOGLOBIN: 10 g/dL — AB (ref 12.0–16.0)
LYMPHS PCT: 7 %
Lymphs Abs: 0.9 10*3/uL — ABNORMAL LOW (ref 1.0–3.6)
MCH: 29.5 pg (ref 26.0–34.0)
MCHC: 32.4 g/dL (ref 32.0–36.0)
MCV: 90.9 fL (ref 80.0–100.0)
MONOS PCT: 7 %
Monocytes Absolute: 0.9 10*3/uL (ref 0.2–0.9)
NEUTROS PCT: 86 %
Neutro Abs: 11.2 10*3/uL — ABNORMAL HIGH (ref 1.4–6.5)
Platelets: 242 10*3/uL (ref 150–440)
RBC: 3.4 MIL/uL — AB (ref 3.80–5.20)
RDW: 14.4 % (ref 11.5–14.5)
WBC: 13 10*3/uL — AB (ref 3.6–11.0)

## 2015-03-17 LAB — PROTIME-INR
INR: 2
PROTHROMBIN TIME: 22.6 s — AB (ref 11.4–15.0)

## 2015-03-17 MED ORDER — LOSARTAN POTASSIUM 50 MG PO TABS
100.0000 mg | ORAL_TABLET | Freq: Every day | ORAL | Status: DC
  Administered 2015-03-18: 100 mg via ORAL
  Filled 2015-03-17: qty 2

## 2015-03-17 MED ORDER — DILTIAZEM HCL 25 MG/5ML IV SOLN
10.0000 mg | INTRAVENOUS | Status: DC | PRN
  Administered 2015-03-17: 10 mg via INTRAVENOUS
  Filled 2015-03-17: qty 5

## 2015-03-17 MED ORDER — LOSARTAN POTASSIUM 25 MG PO TABS
75.0000 mg | ORAL_TABLET | Freq: Once | ORAL | Status: AC
  Administered 2015-03-17: 75 mg via ORAL
  Filled 2015-03-17: qty 1

## 2015-03-17 MED ORDER — DILTIAZEM HCL 25 MG/5ML IV SOLN
15.0000 mg | Freq: Once | INTRAVENOUS | Status: AC
  Administered 2015-03-17: 15 mg via INTRAVENOUS
  Filled 2015-03-17 (×2): qty 5

## 2015-03-17 MED ORDER — DILTIAZEM HCL ER COATED BEADS 180 MG PO CP24
180.0000 mg | ORAL_CAPSULE | Freq: Every day | ORAL | Status: DC
  Administered 2015-03-17 – 2015-03-18 (×2): 180 mg via ORAL
  Filled 2015-03-17 (×2): qty 1

## 2015-03-17 NOTE — Progress Notes (Signed)
Called Dr Elpidio Anis regarding patients hypertension and tachycardia and pt stating she " just doesn't fell good" this AM. Orders received pt aware of plan.

## 2015-03-17 NOTE — Progress Notes (Signed)
ANTIBIOTIC CONSULT NOTE - FOLLOW UP  Pharmacy Consult for levofloxacin Indication: pneumonia  Allergies  Allergen Reactions  . Macrobid [Nitrofurantoin Monohyd Macro] Diarrhea and Nausea And Vomiting  . Lortab [Hydrocodone-Acetaminophen] Itching, Swelling and Rash    Patient Measurements: Height: 5' (152.4 cm) Weight: 140 lb 3.2 oz (63.594 kg) IBW/kg (Calculated) : 45.5  Vital Signs: Temp: 97.5 F (36.4 C) (11/21 0730) Temp Source: Oral (11/21 0730) BP: 154/108 mmHg (11/21 0730) Pulse Rate: 130 (11/21 0730) Intake/Output from previous day: 11/20 0701 - 11/21 0700 In: 120 [P.O.:120] Out: -  Intake/Output from this shift:    Labs:  Recent Labs  03/15/15 0403 03/16/15 0329  WBC 8.4 8.9  HGB 8.8* 10.1*  PLT 175 204  CREATININE  --  0.80   Estimated Creatinine Clearance: 43.6 mL/min (by C-G formula based on Cr of 0.8). No results for input(s): VANCOTROUGH, VANCOPEAK, VANCORANDOM, GENTTROUGH, GENTPEAK, GENTRANDOM, TOBRATROUGH, TOBRAPEAK, TOBRARND, AMIKACINPEAK, AMIKACINTROU, AMIKACIN in the last 72 hours.   Microbiology: Recent Results (from the past 720 hour(s))  Blood culture (routine x 2)     Status: None   Collection Time: 03/11/15  1:18 PM  Result Value Ref Range Status   Specimen Description BLOOD LEFT ASSIST CONTROL  Final   Special Requests BOTTLES DRAWN AEROBIC AND ANAEROBIC 4CC  Final   Culture NO GROWTH 5 DAYS  Final   Report Status 03/16/2015 FINAL  Final  Blood culture (routine x 2)     Status: None   Collection Time: 03/11/15  1:30 PM  Result Value Ref Range Status   Specimen Description BLOOD RIGHT THUMB  Final   Special Requests BOTTLES DRAWN AEROBIC AND ANAEROBIC 4CC  Final   Culture NO GROWTH 5 DAYS  Final   Report Status 03/16/2015 FINAL  Final  Culture, sputum-assessment     Status: None   Collection Time: 03/11/15 10:07 PM  Result Value Ref Range Status   Specimen Description SPUTUM  Final   Special Requests NONE  Final   Sputum  evaluation THIS SPECIMEN IS ACCEPTABLE FOR SPUTUM CULTURE  Final   Report Status 03/12/2015 FINAL  Final  Culture, respiratory (NON-Expectorated)     Status: None   Collection Time: 03/11/15 10:07 PM  Result Value Ref Range Status   Specimen Description SPUTUM  Final   Special Requests NONE Reflexed from [id number]  Final   Gram Stain   Final    MANY WBC SEEN FEW GRAM POSITIVE COCCI FEW GRAM NEGATIVE COCCOBACILLI EXCELLENT SPECIMEN - 90-100% WBCS    Culture Consistent with normal respiratory flora.  Final   Report Status 03/14/2015 FINAL  Final  Urine culture     Status: None   Collection Time: 03/12/15  6:43 PM  Result Value Ref Range Status   Specimen Description URINE, CLEAN CATCH  Final   Special Requests NONE  Final   Culture INSIGNIFICANT GROWTH  Final   Report Status 03/14/2015 FINAL  Final   Assessment: Pharmacy is dosing levofloxacin in this 81 year old female being treated for CAP.   Day #7 antibiotics on levofloxacin 750 mg IV q48 hours   Plan:  Will continue levofloxacin 750mg  PO Q48H, which is appropriate for renal function  , PharmD Clinical Pharmacist  03/17/2015 9:29 AM

## 2015-03-17 NOTE — Progress Notes (Signed)
PT Cancellation Note  Patient Details Name: Joanna Roberts MRN: [id number] DOB: 1928-05-27   Cancelled Treatment:    Reason Eval/Treat Not Completed: Patient declined, no reason specified (Pt transferred to telemetry unit d/t HR issues.  Nursing cleared pt for participation in PT but pt reports being too tired and needing to rest (pt declining PT today).)  Will re-attempt PT treatment session at a later date/time.   [name] 03/17/2015, 1:45 PM Hendricks Limes, PT [phone]

## 2015-03-17 NOTE — Progress Notes (Signed)
SNF and Non-Emergent EMS Transport Benefits:  Number called: [phone] Rep: Jacklynn Barnacle Reference Number: [id number]  [employer] PPO Plan 1 active since 04/26/14 with no deductible. Out of pocket max is $3400, which has been met for calendar year.    In-network SNF:  Covered at 100% of the allowed amount since out of pocket reached.  Josem Kaufmann is required.     Non-emergent EMS transport: Covered at 100% of the allowed amount since out of pocket reached for each one way medically necessary, Medicare covered trip.  Josem Kaufmann is not required.

## 2015-03-17 NOTE — Consult Note (Signed)
[facility]  CARDIOLOGY CONSULT NOTE  Patient ID: Joanna Roberts MRN: [id number] DOB/AGE: [birth year] 79 y.o.  Admit date: 03/11/2015 Referring Physician Sudini Primary Physician Linthavong Primary Cardiologist Fath Reason for Consultation atrial fibrillation  HPI: 81 year old female with history of dilated cardiomyopathy, chronic systolic congestive heart failure, chronic atrial fibrillation, sick sinus syndrome, status post ICD referred for atrial fibrillation with a rapid ventricular rate. The patient was admitted on 03/11/15 with 3-4 day history of shortness of breath, and productive cough with chest x-ray revealing right upper lobe pneumonia. The patient was transferred to telemetry earlier today because her blood pressure and heart rate were elevated. She clinically stable presently, with a heart rate of 90-100 bpm with ventricular pacing and intermittent atrial fibrillation.  Review of systems complete and found to be negative unless listed above     Past Medical History  Diagnosis Date  . CHF (congestive heart failure) (HCC)   . Hypertension   . AICD (automatic cardioverter/defibrillator) present   . Presence of permanent cardiac pacemaker   . Myocardial infarction (HCC)   . Dysrhythmia     Past Surgical History  Procedure Laterality Date  . Hernia repair    . Insert / replace / remove pacemaker    . Appendectomy    . Abdominal hysterectomy    . Peripheral vascular catheterization Right 10/08/2014    Procedure: Lower Extremity Angiography;  Surgeon: Renford Dills, MD;  Location: [facility] INVASIVE CV LAB;  Service: Cardiovascular;  Laterality: Right;  . Peripheral vascular catheterization  10/08/2014    Procedure: Lower Extremity Intervention;  Surgeon: Renford Dills, MD;  Location: [facility] INVASIVE CV LAB;  Service: Cardiovascular;;    Prescriptions prior to admission  Medication Sig Dispense Refill Last Dose  . benzonatate (TESSALON) 200 MG capsule Take 200 mg by mouth  3 (three) times daily as needed for cough.   03/11/2015 at Unknown time  . carvedilol (COREG) 25 MG tablet Take 25 mg by mouth 2 (two) times daily.    03/11/2015 at 0815  . docusate sodium (COLACE) 100 MG capsule Take 100 mg by mouth at bedtime as needed for mild constipation.   Past Week at Unknown time  . doxycycline (VIBRA-TABS) 100 MG tablet Take 100 mg by mouth 2 (two) times daily.   03/11/2015 at Unknown time  . furosemide (LASIX) 20 MG tablet Take 20 mg by mouth daily as needed for edema.    Past Week at Unknown time  . hydrochlorothiazide (HYDRODIURIL) 25 MG tablet Take 25 mg by mouth daily.   03/11/2015 at Unknown time  . levothyroxine (SYNTHROID, LEVOTHROID) 25 MCG tablet Take 25 mcg by mouth daily before breakfast.   03/11/2015 at Unknown time  . losartan (COZAAR) 25 MG tablet Take 25 mg by mouth daily.   03/11/2015 at Unknown time  . nitroGLYCERIN (NITROSTAT) 0.4 MG SL tablet Place 0.4 mg under the tongue every 5 (five) minutes as needed for chest pain.   PRN at PRN  . oxyCODONE-acetaminophen (PERCOCET/ROXICET) 5-325 MG tablet Take 1 tablet by mouth every 4 (four) hours as needed for severe pain.   03/11/2015 at 0815  . temazepam (RESTORIL) 15 MG capsule Take 15 mg by mouth at bedtime as needed for sleep.   03/10/2015 at Unknown time  . venlafaxine XR (EFFEXOR-XR) 37.5 MG 24 hr capsule Take 37.5 mg by mouth daily.   03/10/2015 at Unknown time  . vitamin B-12 (CYANOCOBALAMIN) 1000 MCG tablet Take 1,000 mcg by mouth daily.   03/10/2015  at Unknown time  . warfarin (COUMADIN) 3 MG tablet Take 1.5 mg by mouth at bedtime.    03/10/2015 at 2000   Social History   Social History  . Marital Status: Married    Spouse Name: N/A  . Number of Children: N/A  . Years of Education: N/A   Occupational History  . Not on file.   Social History Main Topics  . Smoking status: Never Smoker   . Smokeless tobacco: Never Used  . Alcohol Use: No  . Drug Use: No  . Sexual Activity: Not on file    Other Topics Concern  . Not on file   Social History Narrative    History reviewed. No pertinent family history.    Review of systems complete and found to be negative unless listed above      PHYSICAL EXAM  General: Well developed, well nourished, in no acute distress HEENT:  Normocephalic and atramatic Neck:  No JVD.  Lungs: Clear bilaterally to auscultation and percussion. Heart: HRRR . Normal S1 and S2 without gallops or murmurs.  Abdomen: Bowel sounds are positive, abdomen soft and non-tender  Msk:  Back normal, normal gait. Normal strength and tone for age. Extremities: No clubbing, cyanosis or edema.   Neuro: Alert and oriented X 3. Psych:  Good affect, responds appropriately  Labs:   Lab Results  Component Value Date   WBC 13.0* 03/17/2015   HGB 10.0* 03/17/2015   HCT 30.9* 03/17/2015   MCV 90.9 03/17/2015   PLT 242 03/17/2015    Recent Labs Lab 03/17/15 0312  NA 133*  K 4.2  CL 94*  CO2 32  BUN 24*  CREATININE 0.94  CALCIUM 8.8*  GLUCOSE 131*   No results found for: CKTOTAL, CKMB, CKMBINDEX, TROPONINI Lab Results  Component Value Date   CHOL 136 11/02/2013   Lab Results  Component Value Date   HDL 34* 11/02/2013   Lab Results  Component Value Date   LDLCALC 85 11/02/2013   Lab Results  Component Value Date   TRIG 85 11/02/2013   No results found for: CHOLHDL No results found for: LDLDIRECT    Radiology: Dg Chest 2 View  03/11/2015  ADDENDUM REPORT: 03/11/2015 13:02 ADDENDUM: When compared with prior exams from the previous day as well as 10/22/2014 the changes seen in both lungs are chronic in nature. No acute abnormality is seen. Electronically Signed   By: Alcide Clever M.D.   On: 03/11/2015 13:02  03/11/2015  CLINICAL DATA:  Shortness of breath with cough and congestion. EXAM: CHEST  2 VIEW COMPARISON:  Report from [place] clinic on 03/10/2015. Images not available. FINDINGS: There is a left cardiac ICD. Hazy densities at both  lung bases and the lateral right upper lung. Suspect these are chronic densities but uncertain. There is dextroscoliosis of the lower thoracic spine. Heart size appears to be enlarged but poorly characterized. There is marked kyphosis in lower thoracic spine. No large pleural effusions. Compression fracture in the mid thoracic spine of unknown age. IMPRESSION: Patchy densities at the lung bases and right upper lung. Suspect these represent chronic changes but the comparison images are not available. No evidence for pulmonary edema. Scoliosis. Electronically Signed: By: Richarda Overlie M.D. On: 03/11/2015 12:51   Dg Ribs Unilateral W/chest Right  03/16/2015  CLINICAL DATA:  Right chest pain. EXAM: RIGHT RIBS AND CHEST - 3+ VIEW COMPARISON:  03/11/2015 chest radiograph. FINDINGS: Three lead left subclavian ICD is stable in configuration with lead tips  overlying the right ventricle and coronary sinus. Stable cardiomediastinal silhouette with mild cardiomegaly. No pneumothorax. Small bilateral pleural effusions, slightly increased bilaterally. No overt pulmonary edema. Patchy bibasilar lung opacities, increased bilaterally. Stable mild patchy opacity in the right upper lung. The area of symptomatic concern as indicated by the patient in the right lower chest wall was indicated by the technologist with a metallic skin BB. No fracture or suspicious focal osseous lesion is seen in the right ribs. IMPRESSION: 1. No fracture or focal osseous lesion is seen in the right ribs. 2. Stable mild cardiomegaly without overt pulmonary edema. 3. Small bilateral pleural effusions, slightly increased bilaterally. 4. Patchy bibasilar lung opacities, increased bilaterally, favor atelectasis. 5. Patchy opacity in the right upper lung, unchanged, nonspecific, differential includes chronic scarring or less likely infection, recommend attention on follow-up PA and lateral chest radiographs in 6-8 weeks. Electronically Signed   By: Delbert Phenix  M.D.   On: 03/16/2015 10:58    EKG: Ventricular pacing with intermittent atrial fibrillation  ASSESSMENT AND PLAN:   1. Atrial fibrillation, with intermittent elevated heart rate, in the setting of pneumonia 2. Dilated cardiomyopathy, chronic systolic congestive heart failure 3. Right upper lobe pneumonia  Recommendations  1. Agree with current therapy 2. Continue warfarin for stroke prevention 3. Add Cardizem for better rate control  Signed: Shiann Kam MD,PhD, [facility] 03/17/2015, 1:29 PM

## 2015-03-17 NOTE — Progress Notes (Signed)
Initial Nutrition Assessment  INTERVENTION:   Meals and Snacks: Cater to patient preferences   NUTRITION DIAGNOSIS:   No nutrition diagnosis at this time  GOAL:   Patient will meet greater than or equal to 90% of their needs  MONITOR:    (Energy Intake, Electrolyte and Renal Profile, Digestive system, Anthropometrics, Pulmonary Profile)  REASON FOR ASSESSMENT:   LOS    ASSESSMENT:   Pt admitted with SOB and pna. Pt transferred to 2A this am for telemetry monitoring.  Past Medical History  Diagnosis Date  . CHF (congestive heart failure) (HCC)   . Hypertension   . AICD (automatic cardioverter/defibrillator) present   . Presence of permanent cardiac pacemaker   . Myocardial infarction (HCC)   . Dysrhythmia      Diet Order:  DIET DYS 3 Room service appropriate?: Yes; Fluid consistency:: Thin    Current Nutrition: Pt eating 79% of meals recorded since admission. No decrease in po intake PTA per MST.   Scheduled Medications:  . carvedilol  25 mg Oral BID  . diltiazem  180 mg Oral Daily  . docusate sodium  100 mg Oral BID  . furosemide  40 mg Oral BID  . levofloxacin  750 mg Oral Q48H  . levothyroxine  25 mcg Oral QAC breakfast  . [START ON 03/18/2015] losartan  100 mg Oral Daily  . pantoprazole  40 mg Oral BID AC  . predniSONE  50 mg Oral Q breakfast  . senna  1 tablet Oral Daily  . sodium chloride  3 mL Intravenous Q12H  . venlafaxine XR  37.5 mg Oral Daily  . vitamin B-12  1,000 mcg Oral Daily  . warfarin  1 mg Oral q1800  . Warfarin - Pharmacist Dosing Inpatient   Does not apply q1800    Electrolyte/Renal Profile and Glucose Profile:   Recent Labs Lab 03/13/15 0438 03/14/15 0419 03/16/15 0329 03/17/15 0312  NA 132* 132* 132* 133*  K 4.1 4.9 4.9 4.2  CL 103 101 97* 94*  CO2 23 27 28  32  BUN 18 20 18  24*  CREATININE 0.87 0.97 0.80 0.94  CALCIUM 8.6* 8.4* 9.0 8.8*  MG 1.7  --   --   --   GLUCOSE 117* 119* 175* 131*   Protein Profile: No  results for input(s): ALBUMIN in the last 168 hours.  Gastrointestinal Profile: Last BM:  03/16/2015   Weight Change: Per CHL weight loss of 4% in 5 months   Height:   Ht Readings from Last 1 Encounters:  03/11/15 5' (1.524 m)    Weight:   Wt Readings from Last 1 Encounters:  03/17/15 140 lb 3.2 oz (63.594 kg)   Wt Readings from Last 10 Encounters:  03/17/15 140 lb 3.2 oz (63.594 kg)  10/08/14 146 lb (66.225 kg)     BMI:  Body mass index is 27.38 kg/(m^2).   EDUCATION NEEDS:   No education needs identified at this time   LOW Care Level  03/19/15, 10/10/14, LDN Pager [phone]

## 2015-03-17 NOTE — Progress Notes (Signed)
Pt. transferred to unit, [id number]. Report taken from 1A [employer]. Pt is V-paced on telemetry 90s-100s, box verified with tele clerk. IV cardizem given per order. Oriented to room, call bell, Ascom phones and staff. Bed in low position. Fall safety plan reviewed, contract signed and placed on wall, yellow non-skid socks in place, bed alarm on. Full assessment to Epic. Will continue to monitor.

## 2015-03-17 NOTE — Care Management (Signed)
Received patient from 1A for elevated heart rate requiring IV push Cardizem.  Patient resides at [facility].  Patient says that she has not been able to ambulate to the dining [place] for about the last 2 weeks because she just has not felt well.  Patient also declines any type of post hospital services such as skilled nursing (Which has been recommended by physical therapy) and home health.  Gave CM permission to speakk with her daughter - Marcos Peloso.  Margaretha Sheffield says that patient has not been feeling well for the last few weeks and she is aware that the patient is refusing post discharge services.  Margaretha Sheffield says the patient may be concerned about copays- The patient does not like copays.  Asked Dannette Barbara to check benefits for snf- patient has met her 3400 dollar out of pocket and snf/home health should be covered at 100%.  If patient for some reason has not met the out of pocket, day 1 - 20 are covered at 100%.  If copays are her reason for refusing services, maybe she would accept something if there were no copays

## 2015-03-17 NOTE — Care Management Important Message (Signed)
Important Message  Patient Details  Name: Joanna Roberts MRN: [id number] Date of Birth: 1928/07/30   Medicare Important Message Given:  Yes    Olegario Messier A Lindsi Bayliss 03/17/2015, 9:49 AM

## 2015-03-17 NOTE — Progress Notes (Signed)
MD on call, Dr Sheryle Hail, notified that [facility] states, she "just doesn't feel right this morning, feels weak, and like heart is racing." HR fluctuating 110's-140's. Nursing continues to monitor.

## 2015-03-17 NOTE — Progress Notes (Signed)
Pt moved to room 259 d/t complaints that heat not working, maintenance notified and room blocked off for repair.

## 2015-03-17 NOTE — Progress Notes (Signed)
ANTICOAGULATION CONSULT NOTE - Follow Up  Pharmacy Consult for Coumadin Indication: atrial fibrillation  Allergies  Allergen Reactions  . Macrobid [Nitrofurantoin Monohyd Macro] Diarrhea and Nausea And Vomiting  . Lortab [Hydrocodone-Acetaminophen] Itching, Swelling and Rash    Patient Measurements: Height: 5' (152.4 cm) Weight: 140 lb 3.2 oz (63.594 kg) IBW/kg (Calculated) : 45.5 Labs:  Recent Labs  03/15/15 0403 03/16/15 0329 03/17/15 0312  HGB 8.8* 10.1*  --   HCT 27.7* 30.2*  --   PLT 175 204  --   LABPROT 24.9* 23.3* 22.6*  INR 2.28 2.09 2.00  CREATININE  --  0.80  --     Estimated Creatinine Clearance: 43.6 mL/min (by C-G formula based on Cr of 0.8).  Assessment: 82 y/o F with a h/o CAF on Coumadin 1.5 mg daily PTA.    INR History: 11/15: INR - 4.98, warfarin held 11/16: INR - 4.11, warfarin held 11/17: INR - 2.82, warfarin 0.5 mg 11/18: INR - 2.54  warfarin 0.5 mg 11/19: INR - 2.28  warfarin 1 mg 11/20: INR - 2.09  Warfarin 1mg  11/21: INR 2.0  Goal of Therapy:  INR 2-3   Plan:  Will continue Warfarin 1 mg daily. F/U INR with am labs. Patient on Levaquin.  12/21, PharmD Clinical Pharmacist  03/17/2015 9:27 AM

## 2015-03-17 NOTE — Progress Notes (Signed)
Spoke with Dr Elpidio Anis on the phone regarding patients heart rate and rhythm per telemetry. Ordered to move patient to 2A. Patient is a aware of plan of care and is agreeable. Pt transferred as to 2A once bed became available and report called. Transferred at 61.

## 2015-03-17 NOTE — Progress Notes (Signed)
Patients HR fluctuating between 110s-140s and will sustain 130s-140s for several minutes before decreasing again. Dr. Elpidio Anis paged and asked if he wanted PRN cardizem given since HR fluctuating this much and he confirmed that he did.

## 2015-03-17 NOTE — Progress Notes (Signed)
[facility] [place] at [facility]   PATIENT NAME: Joanna Roberts    MR#:  [id number]  DATE OF BIRTH:  May 06, 1928  SUBJECTIVE:  CHIEF COMPLAINT:   Chief Complaint  Patient presents with  . Cough  . Shortness of Breath  Cough, SOB and chest congestion. Feels weak. Worse today. HR into 140s.  REVIEW OF SYSTEMS:  CONSTITUTIONAL: Has fever, has generalized weakness.  EYES: No blurred or double vision.  EARS, NOSE, AND THROAT: No tinnitus or ear pain.  RESPIRATORY: Has cough and shortness of breath, no wheezing or hemoptysis.  CARDIOVASCULAR: No chest pain, orthopnea, edema.  GASTROINTESTINAL: No nausea, vomiting, diarrhea or abdominal pain.  GENITOURINARY: No dysuria, hematuria.  ENDOCRINE: No polyuria, nocturia,  HEMATOLOGY: No anemia, easy bruising or bleeding SKIN: No rash or lesion. MUSCULOSKELETAL: No joint pain or arthritis.   NEUROLOGIC: No tingling, numbness, weakness.  PSYCHIATRY: No anxiety or depression.   DRUG ALLERGIES:   Allergies  Allergen Reactions  . Macrobid [Nitrofurantoin Monohyd Macro] Diarrhea and Nausea And Vomiting  . Lortab [Hydrocodone-Acetaminophen] Itching, Swelling and Rash    VITALS:  Blood pressure 140/78, pulse 72, temperature 97.4 F (36.3 C), temperature source Oral, resp. rate 18, height 5' (1.524 m), weight 63.594 kg (140 lb 3.2 oz), SpO2 93 %.  PHYSICAL EXAMINATION:  GENERAL:  81 y.o.-year-old patient sitting in chair with no acute distress.  EYES: Pupils equal, round, reactive to light and accommodation. No scleral icterus. Extraocular muscles intact.  HEENT: Head atraumatic, normocephalic. Oropharynx and nasopharynx clear.  NECK:  Supple, no jugular venous distention. No thyroid enlargement, no tenderness.  LUNGS: Wheezing b/l. Decreased air entry. CARDIOVASCULAR: S1, S2 . Irregular. tachycardic ABDOMEN: Soft, nontender, nondistended. Bowel sounds present. No organomegaly or mass.  EXTREMITIES: No pedal edema,  cyanosis, or clubbing.  NEUROLOGIC: Cranial nerves II through XII are intact. Muscle strength 4/5 in all extremities. Sensation intact. Gait not checked.  PSYCHIATRIC: The patient is alert and oriented x 3.  SKIN: No obvious rash, lesion, or ulcer.    LABORATORY PANEL:   CBC  Recent Labs Lab 03/17/15 0312  WBC 13.0*  HGB 10.0*  HCT 30.9*  PLT 242   ------------------------------------------------------------------------------------------------------------------  Chemistries   Recent Labs Lab 03/13/15 0438  03/17/15 0312  NA 132*  < > 133*  K 4.1  < > 4.2  CL 103  < > 94*  CO2 23  < > 32  GLUCOSE 117*  < > 131*  BUN 18  < > 24*  CREATININE 0.87  < > 0.94  CALCIUM 8.6*  < > 8.8*  MG 1.7  --   --   < > = values in this interval not displayed. ------------------------------------------------------------------------------------------------------------------  Cardiac Enzymes No results for input(s): TROPONINI in the last 168 hours. ------------------------------------------------------------------------------------------------------------------  RADIOLOGY:  Dg Ribs Unilateral W/chest Right  03/16/2015  CLINICAL DATA:  Right chest pain. EXAM: RIGHT RIBS AND CHEST - 3+ VIEW COMPARISON:  03/11/2015 chest radiograph. FINDINGS: Three lead left subclavian ICD is stable in configuration with lead tips overlying the right ventricle and coronary sinus. Stable cardiomediastinal silhouette with mild cardiomegaly. No pneumothorax. Small bilateral pleural effusions, slightly increased bilaterally. No overt pulmonary edema. Patchy bibasilar lung opacities, increased bilaterally. Stable mild patchy opacity in the right upper lung. The area of symptomatic concern as indicated by the patient in the right lower chest wall was indicated by the technologist with a metallic skin BB. No fracture or suspicious focal osseous lesion is seen in the  right ribs. IMPRESSION: 1. No fracture or focal osseous  lesion is seen in the right ribs. 2. Stable mild cardiomegaly without overt pulmonary edema. 3. Small bilateral pleural effusions, slightly increased bilaterally. 4. Patchy bibasilar lung opacities, increased bilaterally, favor atelectasis. 5. Patchy opacity in the right upper lung, unchanged, nonspecific, differential includes chronic scarring or less likely infection, recommend attention on follow-up PA and lateral chest radiographs in 6-8 weeks. Electronically Signed   By: Delbert Phenix M.D.   On: 03/16/2015 10:58    EKG:   Orders placed or performed during the hospital encounter of 03/11/15  . ED EKG  . ED EKG  . EKG 12-Lead  . EKG 12-Lead    ASSESSMENT AND PLAN:   # Afib with RVR Due to pneumonia. On PO coreg. STAT dose cardizem given. HR into 150s. Transferred to 2A. Consult cardiology. On coumadin with INR 2-3  # Sepsis due to bilateral pneumonia Continue levofloxacin, follow-up blood cultures (negative so far) and sputum cultures ( normal flora) Strep and Legionella abs negative Nebulizer treatments as needed  # Hyponatremia likely 2/2 hypovolemic hyponatremia from poor by mouth intake. Improving. Discontinued NS IV and follow-up BMP.  Held hydrochlorothiazide.  * Chronic anemia with slow worsening Likely due to Internal hemmorhoids  # Chronic history of congestive heart failure-ejection fraction unknown Stable. Restarted Lasix  # Essential hypertension. Controlled. continue Coreg and Cozaar.  # Dysphagia. Dysphagia 3 diet per Swallowing study.  Refusing SNF. Will set up [facility] PT  All the records are reviewed and case discussed with Care Management/Social Workerr. Management plans discussed with the patien.  CODE STATUS: Full code  TOTAL CRITICAL CARE TIME TAKING CARE OF THIS PATIENT: 40 minutes.  D/c in 1-2 days.  Milagros Loll R M.D on 03/17/2015 at 2:40 PM  Between 7am to 6pm - Pager - [phone]  After 6pm go to [email or web address] - password [email or web address]  [facility]  [place] [facility]  Office  [phone]  CC: Primary care physician; Marisue Ivan, MD

## 2015-03-18 DIAGNOSIS — I482 Chronic atrial fibrillation, unspecified: Secondary | ICD-10-CM | POA: Diagnosis present

## 2015-03-18 LAB — PROTIME-INR
INR: 1.98
PROTHROMBIN TIME: 22.4 s — AB (ref 11.4–15.0)

## 2015-03-18 MED ORDER — DILTIAZEM HCL ER COATED BEADS 180 MG PO CP24: 180.0000 mg | ORAL_CAPSULE | Freq: Every day | ORAL | Status: DC

## 2015-03-18 MED ORDER — FUROSEMIDE 20 MG PO TABS: 20.0000 mg | ORAL_TABLET | Freq: Every day | ORAL | Status: DC

## 2015-03-18 MED ORDER — LEVOFLOXACIN 750 MG PO TABS: 750.0000 mg | ORAL_TABLET | ORAL | Status: DC

## 2015-03-18 NOTE — Discharge Instructions (Addendum)
°  DIET:  Cardiac diet  DISCHARGE CONDITION:  Stable  ACTIVITY:  Activity as tolerated  OXYGEN:  Home Oxygen: No.   Oxygen Delivery: room air  DISCHARGE LOCATION:  Home with home health   If you experience worsening of your admission symptoms, develop shortness of breath, life threatening emergency, suicidal or homicidal thoughts you must seek medical attention immediately by calling 911 or calling your MD immediately  if symptoms less severe.  You Must read complete instructions/literature along with all the possible adverse reactions/side effects for all the Medicines you take and that have been prescribed to you. Take any new Medicines after you have completely understood and accpet all the possible adverse reactions/side effects.   Please note  You were cared for by a hospitalist during your hospital stay. If you have any questions about your discharge medications or the care you received while you were in the hospital after you are discharged, you can call the unit and asked to speak with the hospitalist on call if the hospitalist that took care of you is not available. Once you are discharged, your primary care physician will handle any further medical issues. Please note that NO REFILLS for any discharge medications will be authorized once you are discharged, as it is imperative that you return to your primary care physician (or establish a relationship with a primary care physician if you do not have one) for your aftercare needs so that they can reassess your need for medications and monitor your lab values.    - Daily fluids < 2 liters. - Low salt diet - Check weight everyday and keep log. Take to your doctors appt. - Take extra dose of lasix if you gain more than 3 pounds weight.   [facility]  [phone]

## 2015-03-18 NOTE — Consult Note (Signed)
   [facility] CM Inpatient Consult   03/18/2015  Joanna Roberts April 08, 1929 [id number]  Spoke with patient at bedside regarding [facility] care management services, patient declined services at this time, provided patient with information pamphlet, and contact number in case she decides to pursue our services at a later date.  Egbert Garibaldi, RN, [facility] Care Management/Hospital Liaison [phone]- Mobile [phone]- Toll Free Main Office

## 2015-03-18 NOTE — Progress Notes (Addendum)
Pt. Discharged to [facility] [place] (her home) with [facility] via wc with daughter transporting. Discharge instructions and medication regimen reviewed at bedside with patient and her daughter. Both verbalize understanding of instructions and medication regimen. Prescriptions given with discharge papers. CHF, afib and stroke prevention reviewed with both; daily weight sheets provided for patient to record weight. Patient assessment unchanged from this morning. TELE and IV discontinued per policy.

## 2015-03-18 NOTE — Care Management (Addendum)
Patient is for discharge today and is now agreeable to home health.  Agency preference is the one that takes my insurance.  Joanna Roberts unable to provide nursing services.  Referral to [facility] for nursing and physical therapy.

## 2015-03-24 NOTE — Discharge Summary (Signed)
[facility] [place] at [facility]   PATIENT NAME: Joanna Roberts    MR#:  [id number]  DATE OF BIRTH:  [birth year]  DATE OF ADMISSION:  03/11/2015 ADMITTING PHYSICIAN: Ramonita Lab, MD  DATE OF DISCHARGE: 03/18/2015  2:40 PM  PRIMARY CARE PHYSICIAN: Marisue Ivan, MD    ADMISSION DIAGNOSIS:  Hyponatremia [E87.1] Community acquired pneumonia [J18.9]  DISCHARGE DIAGNOSIS:  Active Problems:   Pneumonia   A-fib (HCC)   SECONDARY DIAGNOSIS:   Past Medical History  Diagnosis Date  . CHF (congestive heart failure) (HCC)   . Hypertension   . AICD (automatic cardioverter/defibrillator) present   . Presence of permanent cardiac pacemaker   . Myocardial infarction (HCC)   . Dysrhythmia      ADMITTING HISTORY  Joanna Roberts is a 81 y.o. female with a known history of congestive heart failure, chronic atrial fibrillation on Coumadin, essential hypertension and coronary artery disease is presenting to the ED with a chief complaint of cough and shortness of breath for the past 3-4 days. Patient is reporting greenish yellow phlegm while coughing. Was seen by urgent care physician 2 days ago and was started on doxycycline. Patient took only one dose of medicine with no significant improvement. Since last night patient's cough and shortness of breath are progressively getting worse and came into the ED today. Chest x-ray has revealed right upper lobe pneumonia   HOSPITAL COURSE:   # Afib with RVR Patient was on oral Coreg which was restarted. Consult cardiology and patient was also started on oral Cardizem with which her heart rate is well controlled. INR is in therapeutic range. No further episodes of rapid ventricle rate.  # Sepsis due to bilateral pneumonia Continue levofloxacin, follow-up blood cultures (negative so far) and sputum cultures ( normal flora) Strep and Legionella abs negative Nebulizer treatments as needed  # Hyponatremia likely 2/2  hypovolemic hyponatremia from poor by mouth intake. Improving. Discontinued NS IV and follow-up BMP.  Held hydrochlorothiazide.  * Chronic anemia  Likely due to Internal hemmorhoids Stable on day of discharge  # Chronic history of congestive heart failure-ejection fraction unknown Stable. Restarted Lasix  # Essential hypertension. Controlled. continue Coreg and Cozaar.  # Dysphagia. Dysphagia 3 diet per Swallowing study.  Refusing SNF. Will set up [facility] PT/RN  Discussed discharge and plan of care after discharge with daughter over the phone.   CONSULTS OBTAINED:  Treatment Team:  Marcina Millard, MD  DRUG ALLERGIES:   Allergies  Allergen Reactions  . Macrobid [Nitrofurantoin Monohyd Macro] Diarrhea and Nausea And Vomiting  . Lortab [Hydrocodone-Acetaminophen] Itching, Swelling and Rash    DISCHARGE MEDICATIONS:   Discharge Medication List as of 03/18/2015 11:00 AM    START taking these medications   Details  diltiazem (CARDIZEM CD) 180 MG 24 hr capsule Take 1 capsule (180 mg total) by mouth daily., Starting 03/18/2015, Until Discontinued, Print    levofloxacin (LEVAQUIN) 750 MG tablet Take 1 tablet (750 mg total) by mouth every other day., Starting 03/18/2015, Until Discontinued, Print      CONTINUE these medications which have CHANGED   Details  furosemide (LASIX) 20 MG tablet Take 1 tablet (20 mg total) by mouth daily., Starting 03/18/2015, Until Discontinued, Print      CONTINUE these medications which have NOT CHANGED   Details  benzonatate (TESSALON) 200 MG capsule Take 200 mg by mouth 3 (three) times daily as needed for cough., Until Discontinued, Historical Med    carvedilol (COREG) 25 MG tablet  Take 25 mg by mouth 2 (two) times daily. , Until Discontinued, Historical Med    docusate sodium (COLACE) 100 MG capsule Take 100 mg by mouth at bedtime as needed for mild constipation., Until Discontinued, Historical Med    hydrochlorothiazide (HYDRODIURIL)  25 MG tablet Take 25 mg by mouth daily., Until Discontinued, Historical Med    levothyroxine (SYNTHROID, LEVOTHROID) 25 MCG tablet Take 25 mcg by mouth daily before breakfast., Until Discontinued, Historical Med    losartan (COZAAR) 25 MG tablet Take 25 mg by mouth daily., Until Discontinued, Historical Med    nitroGLYCERIN (NITROSTAT) 0.4 MG SL tablet Place 0.4 mg under the tongue every 5 (five) minutes as needed for chest pain., Until Discontinued, Historical Med    oxyCODONE-acetaminophen (PERCOCET/ROXICET) 5-325 MG tablet Take 1 tablet by mouth every 4 (four) hours as needed for severe pain., Until Discontinued, Historical Med    temazepam (RESTORIL) 15 MG capsule Take 15 mg by mouth at bedtime as needed for sleep., Until Discontinued, Historical Med    venlafaxine XR (EFFEXOR-XR) 37.5 MG 24 hr capsule Take 37.5 mg by mouth daily., Until Discontinued, Historical Med    vitamin B-12 (CYANOCOBALAMIN) 1000 MCG tablet Take 1,000 mcg by mouth daily., Until Discontinued, Historical Med    warfarin (COUMADIN) 3 MG tablet Take 1.5 mg by mouth at bedtime. , Until Discontinued, Historical Med      STOP taking these medications     doxycycline (VIBRA-TABS) 100 MG tablet          Today    VITAL SIGNS:  Blood pressure 118/78, pulse 80, temperature 97.4 F (36.3 C), temperature source Oral, resp. rate 18, height 5' (1.524 m), weight 63.48 kg (139 lb 15.2 oz), SpO2 96 %.  I/O:  No intake or output data in the 24 hours ending 03/24/15 1400  PHYSICAL EXAMINATION:  Physical Exam  GENERAL:  81 y.o.-year-old patient lying in the bed with no acute distress.  LUNGS: Normal breath sounds bilaterally, no wheezing, rales,rhonchi or crepitation. No use of accessory muscles of respiration.  CARDIOVASCULAR: S1, S2 normal. No murmurs, rubs, or gallops.  ABDOMEN: Soft, non-tender, non-distended. Bowel sounds present. No organomegaly or mass.  NEUROLOGIC: Moves all 4 extremities. PSYCHIATRIC: The  patient is alert and oriented x 3.  SKIN: No obvious rash, lesion, or ulcer.   DATA REVIEW:   CBC No results for input(s): WBC, HGB, HCT, PLT in the last 168 hours.  Chemistries  No results for input(s): NA, K, CL, CO2, GLUCOSE, BUN, CREATININE, CALCIUM, MG, AST, ALT, ALKPHOS, BILITOT in the last 168 hours.  Invalid input(s): GFRCGP  Cardiac Enzymes No results for input(s): TROPONINI in the last 168 hours.  Microbiology Results  Results for orders placed or performed during the hospital encounter of 03/11/15  Blood culture (routine x 2)     Status: None   Collection Time: 03/11/15  1:18 PM  Result Value Ref Range Status   Specimen Description BLOOD LEFT ASSIST CONTROL  Final   Special Requests BOTTLES DRAWN AEROBIC AND ANAEROBIC 4CC  Final   Culture NO GROWTH 5 DAYS  Final   Report Status 03/16/2015 FINAL  Final  Blood culture (routine x 2)     Status: None   Collection Time: 03/11/15  1:30 PM  Result Value Ref Range Status   Specimen Description BLOOD RIGHT THUMB  Final   Special Requests BOTTLES DRAWN AEROBIC AND ANAEROBIC 4CC  Final   Culture NO GROWTH 5 DAYS  Final   Report Status  03/16/2015 FINAL  Final  Culture, sputum-assessment     Status: None   Collection Time: 03/11/15 10:07 PM  Result Value Ref Range Status   Specimen Description SPUTUM  Final   Special Requests NONE  Final   Sputum evaluation THIS SPECIMEN IS ACCEPTABLE FOR SPUTUM CULTURE  Final   Report Status 03/12/2015 FINAL  Final  Culture, respiratory (NON-Expectorated)     Status: None   Collection Time: 03/11/15 10:07 PM  Result Value Ref Range Status   Specimen Description SPUTUM  Final   Special Requests NONE Reflexed from [id number]  Final   Gram Stain   Final    MANY WBC SEEN FEW GRAM POSITIVE COCCI FEW GRAM NEGATIVE COCCOBACILLI EXCELLENT SPECIMEN - 90-100% WBCS    Culture Consistent with normal respiratory flora.  Final   Report Status 03/14/2015 FINAL  Final  Urine culture     Status: None    Collection Time: 03/12/15  6:43 PM  Result Value Ref Range Status   Specimen Description URINE, CLEAN CATCH  Final   Special Requests NONE  Final   Culture INSIGNIFICANT GROWTH  Final   Report Status 03/14/2015 FINAL  Final    RADIOLOGY:  No results found.    Follow up with PCP in 1 week.  Management plans discussed with the patient, family and they are in agreement.  CODE STATUS:   TOTAL TIME TAKING CARE OF THIS PATIENT ON DAY OF DISCHARGE: more than 30 minutes.    Milagros Loll R M.D on 03/24/2015 at 2:00 PM  Between 7am to 6pm - Pager - [phone]  After 6pm go to [email or web address] - password [email or web address] [facility]  [facility]  Office  [phone]  CC: Primary care physician; Marisue Ivan, MD     Note: This dictation was prepared with Dragon dictation along with smaller phrase technology. Any transcriptional errors that result from this process are unintentional.

## 2015-03-25 ENCOUNTER — Ambulatory Visit: Payer: PPO | Admitting: Primary Care

## 2015-03-27 ENCOUNTER — Ambulatory Visit: Payer: PPO | Admitting: Primary Care

## 2015-04-24 ENCOUNTER — Ambulatory Visit (INDEPENDENT_AMBULATORY_CARE_PROVIDER_SITE_OTHER): Payer: PPO | Admitting: Obstetrics and Gynecology

## 2015-04-24 ENCOUNTER — Encounter: Payer: Self-pay | Admitting: Obstetrics and Gynecology

## 2015-04-24 VITALS — BP 135/84 | HR 74 | Ht 61.0 in | Wt 142.3 lb

## 2015-04-24 DIAGNOSIS — I8393 Asymptomatic varicose veins of bilateral lower extremities: Secondary | ICD-10-CM

## 2015-04-24 DIAGNOSIS — IMO0002 Reserved for concepts with insufficient information to code with codable children: Secondary | ICD-10-CM

## 2015-04-24 DIAGNOSIS — R399 Unspecified symptoms and signs involving the genitourinary system: Secondary | ICD-10-CM | POA: Diagnosis not present

## 2015-04-24 DIAGNOSIS — N8110 Cystocele, unspecified: Secondary | ICD-10-CM

## 2015-04-24 DIAGNOSIS — I8390 Asymptomatic varicose veins of unspecified lower extremity: Secondary | ICD-10-CM

## 2015-04-24 DIAGNOSIS — N816 Rectocele: Secondary | ICD-10-CM

## 2015-04-24 LAB — POCT URINALYSIS DIPSTICK
Bilirubin, UA: NEGATIVE
GLUCOSE UA: NEGATIVE
KETONES UA: NEGATIVE
Nitrite, UA: NEGATIVE
SPEC GRAV UA: 1.02
UROBILINOGEN UA: NEGATIVE
pH, UA: 6

## 2015-04-24 NOTE — Progress Notes (Signed)
   GYNECOLOGY PROGRESS NOTE  Subjective:    Patient ID: GLORIMAR STROOPE, female    DOB: [birth year], 81 y.o.   MRN: [id number]  HPI  Patient is a 81 y.o. [id number] female who presents for pessary check.  Patient with h/o vaginal prolapse/cystocele (Grade 3).    Of note patient has not been seen by current office since 08/2013. Has missed several f/u appointments due to illness (notes being in and out of hospital with pneumonia, and also had varicose vein surgery).  Does note that it has been several months since last pessary check. Has been seen over past several months by Urology. Patient also reports that she is relocating with her family to [place], and that this will be her last visit.   The following portions of the patient's history were reviewed and updated as appropriate: allergies, current medications, past family history, past medical history, past social history, past surgical history and problem list.  Review of Systems A comprehensive review of systems was negative except for: Cardiovascular: positive for claudication and leg swelling Genitourinary: positive for dysuria and frequency   Objective:   Blood pressure 135/84, pulse 74, height 5\' 1"  (1.549 m), weight 142 lb 4.8 oz (64.547 kg). General appearance: alert and no distress Abdomen: soft, non-tender; bowel sounds normal; no masses,  no organomegaly Pelvic: The patient's Gelhorn 3 1/4 pessary was removed, cleaned and replaced without complications. Speculum examination revealed normal vaginal mucosa with no lesions or lacerations. Extremities: varicose veins noted and venous stasis dermatitis noted Neurologic: Grossly normal   Labs:  Results for orders placed or performed in visit on 04/24/15  POCT urinalysis dipstick  Result Value Ref Range   Color, UA Yellow    Clarity, UA clear    Glucose, UA neg    Bilirubin, UA neg    Ketones, UA neg    Spec Grav, UA 1.020    Blood, UA HT    pH, UA 6.0    Protein, UA Trace    Urobilinogen, UA negative    Nitrite, UA neg    Leukocytes, UA moderate (2+) (A) Negative    Assessment:  Cystocele/vaginal prolapse, Grade III Rectocele Grade II Varicose veins UTI symptoms Plan:   Advised that since the patient was relocating, she will need to look into establishing care with a GYN in 04/26/15 to continue pessary maintenance (next check due at 1012 weeks).  Continue to use Trimo-San gel as prescribed.  Has had varicose vein laser surgery to legs bilaterally earlier this year, and is being managed by PCP and Vascular surgery.  UA today shows no evidence of infection.    [place], MD [facility]

## 2015-04-26 LAB — URINE CULTURE: ORGANISM ID, BACTERIA: NO GROWTH

## 2015-04-29 ENCOUNTER — Telehealth: Payer: Self-pay | Admitting: Obstetrics and Gynecology

## 2015-04-29 NOTE — Telephone Encounter (Signed)
Patient called requesting results from her urinalysis. Thanks

## 2015-04-30 NOTE — Telephone Encounter (Signed)
LM informing pt of negative results

## 2015-05-08 DIAGNOSIS — R05 Cough: Secondary | ICD-10-CM | POA: Diagnosis not present

## 2015-05-08 DIAGNOSIS — N390 Urinary tract infection, site not specified: Secondary | ICD-10-CM | POA: Diagnosis not present

## 2015-05-08 DIAGNOSIS — I509 Heart failure, unspecified: Secondary | ICD-10-CM | POA: Diagnosis not present

## 2015-05-14 DIAGNOSIS — I350 Nonrheumatic aortic (valve) stenosis: Secondary | ICD-10-CM | POA: Diagnosis not present

## 2015-05-14 DIAGNOSIS — I509 Heart failure, unspecified: Secondary | ICD-10-CM | POA: Diagnosis not present

## 2015-05-14 DIAGNOSIS — Z950 Presence of cardiac pacemaker: Secondary | ICD-10-CM | POA: Diagnosis not present

## 2015-05-14 DIAGNOSIS — Z95810 Presence of automatic (implantable) cardiac defibrillator: Secondary | ICD-10-CM | POA: Diagnosis not present

## 2015-05-14 DIAGNOSIS — Z7901 Long term (current) use of anticoagulants: Secondary | ICD-10-CM | POA: Diagnosis not present

## 2015-05-14 DIAGNOSIS — I428 Other cardiomyopathies: Secondary | ICD-10-CM | POA: Diagnosis not present

## 2015-05-15 DIAGNOSIS — R221 Localized swelling, mass and lump, neck: Secondary | ICD-10-CM | POA: Diagnosis not present

## 2015-05-19 DIAGNOSIS — I509 Heart failure, unspecified: Secondary | ICD-10-CM | POA: Diagnosis not present

## 2015-05-19 DIAGNOSIS — Z5181 Encounter for therapeutic drug level monitoring: Secondary | ICD-10-CM | POA: Diagnosis not present

## 2015-05-19 DIAGNOSIS — Z7901 Long term (current) use of anticoagulants: Secondary | ICD-10-CM | POA: Diagnosis not present

## 2015-06-05 DIAGNOSIS — N3000 Acute cystitis without hematuria: Secondary | ICD-10-CM | POA: Diagnosis not present

## 2015-06-05 DIAGNOSIS — Z5181 Encounter for therapeutic drug level monitoring: Secondary | ICD-10-CM | POA: Diagnosis not present

## 2015-06-05 DIAGNOSIS — L309 Dermatitis, unspecified: Secondary | ICD-10-CM | POA: Diagnosis not present

## 2015-06-05 DIAGNOSIS — Z7901 Long term (current) use of anticoagulants: Secondary | ICD-10-CM | POA: Diagnosis not present

## 2015-06-10 DIAGNOSIS — L308 Other specified dermatitis: Secondary | ICD-10-CM | POA: Diagnosis not present

## 2015-06-10 DIAGNOSIS — L089 Local infection of the skin and subcutaneous tissue, unspecified: Secondary | ICD-10-CM | POA: Diagnosis not present

## 2015-06-17 DIAGNOSIS — L089 Local infection of the skin and subcutaneous tissue, unspecified: Secondary | ICD-10-CM | POA: Diagnosis not present

## 2015-06-17 DIAGNOSIS — L308 Other specified dermatitis: Secondary | ICD-10-CM | POA: Diagnosis not present

## 2015-06-17 DIAGNOSIS — B373 Candidiasis of vulva and vagina: Secondary | ICD-10-CM | POA: Diagnosis not present

## 2015-06-17 DIAGNOSIS — N3000 Acute cystitis without hematuria: Secondary | ICD-10-CM | POA: Diagnosis not present

## 2015-06-24 DIAGNOSIS — Z7901 Long term (current) use of anticoagulants: Secondary | ICD-10-CM | POA: Diagnosis not present

## 2015-06-24 DIAGNOSIS — E039 Hypothyroidism, unspecified: Secondary | ICD-10-CM | POA: Diagnosis not present

## 2015-06-24 DIAGNOSIS — Z5181 Encounter for therapeutic drug level monitoring: Secondary | ICD-10-CM | POA: Diagnosis not present

## 2015-07-05 DIAGNOSIS — M79604 Pain in right leg: Secondary | ICD-10-CM | POA: Diagnosis not present

## 2015-07-05 DIAGNOSIS — I509 Heart failure, unspecified: Secondary | ICD-10-CM | POA: Diagnosis not present

## 2015-07-05 DIAGNOSIS — E279 Disorder of adrenal gland, unspecified: Secondary | ICD-10-CM | POA: Diagnosis not present

## 2015-07-05 DIAGNOSIS — R079 Chest pain, unspecified: Secondary | ICD-10-CM | POA: Diagnosis not present

## 2015-07-05 DIAGNOSIS — G894 Chronic pain syndrome: Secondary | ICD-10-CM | POA: Diagnosis not present

## 2015-07-05 DIAGNOSIS — K551 Chronic vascular disorders of intestine: Secondary | ICD-10-CM | POA: Diagnosis not present

## 2015-07-05 DIAGNOSIS — M25551 Pain in right hip: Secondary | ICD-10-CM | POA: Diagnosis not present

## 2015-07-05 DIAGNOSIS — Z0389 Encounter for observation for other suspected diseases and conditions ruled out: Secondary | ICD-10-CM | POA: Diagnosis not present

## 2015-07-05 DIAGNOSIS — Z950 Presence of cardiac pacemaker: Secondary | ICD-10-CM | POA: Diagnosis not present

## 2015-07-05 DIAGNOSIS — M1611 Unilateral primary osteoarthritis, right hip: Secondary | ICD-10-CM | POA: Diagnosis not present

## 2015-07-08 DIAGNOSIS — I482 Chronic atrial fibrillation: Secondary | ICD-10-CM | POA: Diagnosis not present

## 2015-07-11 DIAGNOSIS — D649 Anemia, unspecified: Secondary | ICD-10-CM | POA: Diagnosis not present

## 2015-07-11 DIAGNOSIS — Z7901 Long term (current) use of anticoagulants: Secondary | ICD-10-CM | POA: Diagnosis not present

## 2015-07-11 DIAGNOSIS — Z5181 Encounter for therapeutic drug level monitoring: Secondary | ICD-10-CM | POA: Diagnosis not present

## 2015-07-11 DIAGNOSIS — R233 Spontaneous ecchymoses: Secondary | ICD-10-CM | POA: Diagnosis not present

## 2015-07-11 DIAGNOSIS — G479 Sleep disorder, unspecified: Secondary | ICD-10-CM | POA: Diagnosis not present

## 2015-07-15 DIAGNOSIS — M1611 Unilateral primary osteoarthritis, right hip: Secondary | ICD-10-CM | POA: Diagnosis not present

## 2015-07-15 DIAGNOSIS — Z5181 Encounter for therapeutic drug level monitoring: Secondary | ICD-10-CM | POA: Diagnosis not present

## 2015-07-15 DIAGNOSIS — Z7901 Long term (current) use of anticoagulants: Secondary | ICD-10-CM | POA: Diagnosis not present

## 2015-07-17 DIAGNOSIS — M1611 Unilateral primary osteoarthritis, right hip: Secondary | ICD-10-CM | POA: Diagnosis not present

## 2015-07-22 DIAGNOSIS — I480 Paroxysmal atrial fibrillation: Secondary | ICD-10-CM | POA: Diagnosis not present

## 2015-07-22 DIAGNOSIS — Z87440 Personal history of urinary (tract) infections: Secondary | ICD-10-CM | POA: Diagnosis not present

## 2015-07-22 DIAGNOSIS — E039 Hypothyroidism, unspecified: Secondary | ICD-10-CM | POA: Diagnosis not present

## 2015-07-22 DIAGNOSIS — F5101 Primary insomnia: Secondary | ICD-10-CM | POA: Diagnosis not present

## 2015-07-22 DIAGNOSIS — G8929 Other chronic pain: Secondary | ICD-10-CM | POA: Diagnosis not present

## 2015-07-22 DIAGNOSIS — M25551 Pain in right hip: Secondary | ICD-10-CM | POA: Diagnosis not present

## 2015-07-24 DIAGNOSIS — M4726 Other spondylosis with radiculopathy, lumbar region: Secondary | ICD-10-CM | POA: Diagnosis not present

## 2015-07-24 DIAGNOSIS — M7061 Trochanteric bursitis, right hip: Secondary | ICD-10-CM | POA: Diagnosis not present

## 2015-07-24 DIAGNOSIS — M5136 Other intervertebral disc degeneration, lumbar region: Secondary | ICD-10-CM | POA: Diagnosis not present

## 2015-07-25 ENCOUNTER — Other Ambulatory Visit: Payer: Self-pay | Admitting: Orthopedic Surgery

## 2015-07-25 DIAGNOSIS — M4726 Other spondylosis with radiculopathy, lumbar region: Secondary | ICD-10-CM

## 2015-07-29 DIAGNOSIS — F4321 Adjustment disorder with depressed mood: Secondary | ICD-10-CM | POA: Diagnosis not present

## 2015-08-05 ENCOUNTER — Ambulatory Visit
Admission: RE | Admit: 2015-08-05 | Discharge: 2015-08-05 | Disposition: A | Discharge status: Home / Self Care | Payer: PPO | Source: Ambulatory Visit | Attending: Orthopedic Surgery | Admitting: Orthopedic Surgery

## 2015-08-05 DIAGNOSIS — M5416 Radiculopathy, lumbar region: Secondary | ICD-10-CM | POA: Diagnosis not present

## 2015-08-05 DIAGNOSIS — M5126 Other intervertebral disc displacement, lumbar region: Secondary | ICD-10-CM | POA: Diagnosis not present

## 2015-08-05 DIAGNOSIS — M4726 Other spondylosis with radiculopathy, lumbar region: Secondary | ICD-10-CM

## 2015-08-05 DIAGNOSIS — N281 Cyst of kidney, acquired: Secondary | ICD-10-CM | POA: Diagnosis not present

## 2015-08-05 DIAGNOSIS — M4806 Spinal stenosis, lumbar region: Secondary | ICD-10-CM | POA: Insufficient documentation

## 2015-08-05 DIAGNOSIS — M419 Scoliosis, unspecified: Secondary | ICD-10-CM | POA: Insufficient documentation

## 2015-08-05 DIAGNOSIS — M5136 Other intervertebral disc degeneration, lumbar region: Secondary | ICD-10-CM | POA: Insufficient documentation

## 2015-08-05 DIAGNOSIS — M47816 Spondylosis without myelopathy or radiculopathy, lumbar region: Secondary | ICD-10-CM | POA: Diagnosis not present

## 2015-08-14 DIAGNOSIS — M4726 Other spondylosis with radiculopathy, lumbar region: Secondary | ICD-10-CM | POA: Diagnosis not present

## 2015-08-14 DIAGNOSIS — S32040A Wedge compression fracture of fourth lumbar vertebra, initial encounter for closed fracture: Secondary | ICD-10-CM | POA: Diagnosis not present

## 2015-08-14 DIAGNOSIS — M5136 Other intervertebral disc degeneration, lumbar region: Secondary | ICD-10-CM | POA: Diagnosis not present

## 2015-08-14 DIAGNOSIS — M4806 Spinal stenosis, lumbar region: Secondary | ICD-10-CM | POA: Diagnosis not present

## 2015-08-14 DIAGNOSIS — I480 Paroxysmal atrial fibrillation: Secondary | ICD-10-CM | POA: Diagnosis not present

## 2015-08-14 DIAGNOSIS — N390 Urinary tract infection, site not specified: Secondary | ICD-10-CM | POA: Diagnosis not present

## 2015-08-14 DIAGNOSIS — R350 Frequency of micturition: Secondary | ICD-10-CM | POA: Diagnosis not present

## 2015-08-14 DIAGNOSIS — M542 Cervicalgia: Secondary | ICD-10-CM | POA: Diagnosis not present

## 2015-08-14 DIAGNOSIS — S22080A Wedge compression fracture of T11-T12 vertebra, initial encounter for closed fracture: Secondary | ICD-10-CM | POA: Diagnosis not present

## 2015-09-02 DIAGNOSIS — I480 Paroxysmal atrial fibrillation: Secondary | ICD-10-CM | POA: Diagnosis not present

## 2015-09-02 DIAGNOSIS — R350 Frequency of micturition: Secondary | ICD-10-CM | POA: Diagnosis not present

## 2015-09-16 DIAGNOSIS — I480 Paroxysmal atrial fibrillation: Secondary | ICD-10-CM | POA: Diagnosis not present

## 2015-09-17 ENCOUNTER — Ambulatory Visit: Payer: PPO | Admitting: Urology

## 2015-09-17 ENCOUNTER — Encounter: Payer: Self-pay | Admitting: Urology

## 2015-09-17 ENCOUNTER — Ambulatory Visit (INDEPENDENT_AMBULATORY_CARE_PROVIDER_SITE_OTHER): Payer: PPO | Admitting: Urology

## 2015-09-17 VITALS — BP 127/79 | HR 75 | Ht 64.0 in | Wt 138.0 lb

## 2015-09-17 DIAGNOSIS — Z86010 Personal history of colonic polyps: Secondary | ICD-10-CM | POA: Insufficient documentation

## 2015-09-17 DIAGNOSIS — N390 Urinary tract infection, site not specified: Secondary | ICD-10-CM | POA: Diagnosis not present

## 2015-09-17 DIAGNOSIS — I495 Sick sinus syndrome: Secondary | ICD-10-CM | POA: Insufficient documentation

## 2015-09-17 DIAGNOSIS — N3946 Mixed incontinence: Secondary | ICD-10-CM

## 2015-09-17 DIAGNOSIS — Z87448 Personal history of other diseases of urinary system: Secondary | ICD-10-CM | POA: Diagnosis not present

## 2015-09-17 DIAGNOSIS — G4700 Insomnia, unspecified: Secondary | ICD-10-CM | POA: Insufficient documentation

## 2015-09-17 DIAGNOSIS — E039 Hypothyroidism, unspecified: Secondary | ICD-10-CM | POA: Insufficient documentation

## 2015-09-17 DIAGNOSIS — M549 Dorsalgia, unspecified: Secondary | ICD-10-CM

## 2015-09-17 DIAGNOSIS — K219 Gastro-esophageal reflux disease without esophagitis: Secondary | ICD-10-CM | POA: Insufficient documentation

## 2015-09-17 DIAGNOSIS — Z87898 Personal history of other specified conditions: Secondary | ICD-10-CM

## 2015-09-17 DIAGNOSIS — K5790 Diverticulosis of intestine, part unspecified, without perforation or abscess without bleeding: Secondary | ICD-10-CM | POA: Insufficient documentation

## 2015-09-17 DIAGNOSIS — Z87440 Personal history of urinary (tract) infections: Secondary | ICD-10-CM | POA: Insufficient documentation

## 2015-09-17 DIAGNOSIS — K449 Diaphragmatic hernia without obstruction or gangrene: Secondary | ICD-10-CM | POA: Insufficient documentation

## 2015-09-17 DIAGNOSIS — N819 Female genital prolapse, unspecified: Secondary | ICD-10-CM

## 2015-09-17 DIAGNOSIS — F4321 Adjustment disorder with depressed mood: Secondary | ICD-10-CM | POA: Insufficient documentation

## 2015-09-17 DIAGNOSIS — G8929 Other chronic pain: Secondary | ICD-10-CM | POA: Insufficient documentation

## 2015-09-17 DIAGNOSIS — I429 Cardiomyopathy, unspecified: Secondary | ICD-10-CM | POA: Insufficient documentation

## 2015-09-17 LAB — MICROSCOPIC EXAMINATION
RBC, UA: NONE SEEN /hpf (ref 0–?)
WBC, UA: >30 /hpf — AB (ref 0–?)

## 2015-09-17 LAB — URINALYSIS, COMPLETE
BILIRUBIN UA: NEGATIVE
Glucose, UA: NEGATIVE
Nitrite, UA: NEGATIVE
PH UA: 6 (ref 5.0–7.5)
Specific Gravity, UA: 1.015 (ref 1.005–1.030)
UUROB: 0.2 mg/dL (ref 0.2–1.0)

## 2015-09-17 LAB — BLADDER SCAN AMB NON-IMAGING

## 2015-09-17 MED ORDER — ESTROGENS, CONJUGATED 0.625 MG/GM VA CREA: 1.0000 | TOPICAL_CREAM | VAGINAL | Status: DC

## 2015-09-17 MED ORDER — DOCUSATE SODIUM 100 MG PO CAPS
100.0000 mg | ORAL_CAPSULE | Freq: Two times a day (BID) | ORAL | Status: DC | PRN
Start: 2015-09-17 — End: 2017-05-03

## 2015-09-17 NOTE — Progress Notes (Signed)
09/17/2015 10:57 AM   Joanna Roberts [birth year] [id number]  Referring provider: Marisue Ivan, MD [phone] [facility] [address] [facility] [place], [place] [address]  Chief Complaint  Patient presents with  . Recurrent UTI    HPI: 82 year old female who was previously followed by Michiel Cowboy and our office for recurrent urinary tract infections and chronic urinary retention. She was last seen in March 2016. She returns today for routine follow-up.  Her urinary retention has resolved since her pessary has been in place. Previously, she was on clean intermittent catheterization. Post void residual today minimal.  She feels like she is been doing well emptying her bladder.  She does sometimes double void to ensure that her bladder completely empty.  She was treated in March 2017 which grew Proteus Mirabilis resistant to bactrim.    She does have baseline urge incontinence and wears several thick pads per day.  She also complains of nocturia.    She does go to Dr. Valentino Saxon for pessary change/ cleanings but has not been in several months.    She does occasionally take lasix which makes her urinary symptoms get worse.  She has to stay at home when she does take it.    Today, she has no urinary complains other than urge incontinence.     PMH: Past Medical History  Diagnosis Date  . CHF (congestive heart failure) (HCC)   . Hypertension   . AICD (automatic cardioverter/defibrillator) present   . Presence of permanent cardiac pacemaker   . Myocardial infarction (HCC)   . Dysrhythmia     Surgical History: Past Surgical History  Procedure Laterality Date  . Hernia repair    . Insert / replace / remove pacemaker    . Appendectomy    . Abdominal hysterectomy    . Peripheral vascular catheterization Right 10/08/2014    Procedure: Lower Extremity Angiography;  Surgeon: Renford Dills, MD;  Location: [facility] INVASIVE CV LAB;  Service: Cardiovascular;  Laterality: Right;  .  Peripheral vascular catheterization  10/08/2014    Procedure: Lower Extremity Intervention;  Surgeon: Renford Dills, MD;  Location: [facility] INVASIVE CV LAB;  Service: Cardiovascular;;    Home Medications:    Medication List       This list is accurate as of: 09/17/15 10:57 AM.  Always use your most recent med list.               carvedilol 25 MG tablet  Commonly known as:  COREG  Take 25 mg by mouth 2 (two) times daily.     conjugated estrogens vaginal cream  Commonly known as:  PREMARIN  Place 1 Applicatorful vaginally every Monday, Wednesday, and Friday at 8 PM. Small pea sized amount per urethra     docusate sodium 100 MG capsule  Commonly known as:  COLACE  Take 100 mg by mouth at bedtime as needed for mild constipation.     docusate sodium 100 MG capsule  Commonly known as:  COLACE  Take 1 capsule (100 mg total) by mouth 2 (two) times daily as needed (take to keep stool soft.).     furosemide 20 MG tablet  Commonly known as:  LASIX  Take 1 tablet (20 mg total) by mouth daily.     levothyroxine 25 MCG tablet  Commonly known as:  SYNTHROID, LEVOTHROID  Take 25 mcg by mouth daily before breakfast.     losartan 25 MG tablet  Commonly known as:  COZAAR  Take 25 mg  by mouth daily.     nitroGLYCERIN 0.4 MG SL tablet  Commonly known as:  NITROSTAT  Place 0.4 mg under the tongue every 5 (five) minutes as needed for chest pain.     oxyCODONE-acetaminophen 5-325 MG tablet  Commonly known as:  PERCOCET/ROXICET  Take 1 tablet by mouth every 4 (four) hours as needed for severe pain.     temazepam 15 MG capsule  Commonly known as:  RESTORIL  Take 15 mg by mouth at bedtime as needed for sleep.     venlafaxine XR 37.5 MG 24 hr capsule  Commonly known as:  EFFEXOR-XR  Take 37.5 mg by mouth daily.     warfarin 3 MG tablet  Commonly known as:  COUMADIN  Take 1.5 mg by mouth at bedtime.        Allergies:  Allergies  Allergen Reactions  . Lortab  [Hydrocodone-Acetaminophen] Itching, Swelling and Rash  . Macrobid [Nitrofurantoin Monohyd Macro] Diarrhea and Nausea And Vomiting  . Nitrofurantoin Diarrhea, Nausea Only and Nausea And Vomiting    Family History: History reviewed. No pertinent family history.  Social History:  reports that she has never smoked. She has never used smokeless tobacco. She reports that she does not drink alcohol or use illicit drugs.  ROS: UROLOGY Frequent Urination?: No Hard to postpone urination?: No Burning/pain with urination?: No Get up at night to urinate?: Yes Leakage of urine?: Yes Urine stream starts and stops?: No Trouble starting stream?: No Do you have to strain to urinate?: No Blood in urine?: No Urinary tract infection?: Yes Sexually transmitted disease?: No Injury to kidneys or bladder?: No Painful intercourse?: No Weak stream?: No Currently pregnant?: No Vaginal bleeding?: No Last menstrual period?: n  Gastrointestinal Nausea?: No Vomiting?: No Indigestion/heartburn?: Yes Diarrhea?: No Constipation?: No  Constitutional Fever: No Night sweats?: Yes Weight loss?: No Fatigue?: No  Skin Skin rash/lesions?: No Itching?: No  Eyes Blurred vision?: No Double vision?: No  Ears/Nose/Throat Sore throat?: No Sinus problems?: No  Hematologic/Lymphatic Swollen glands?: No Easy bruising?: No  Cardiovascular Leg swelling?: Yes Chest pain?: Yes  Respiratory Cough?: No Shortness of breath?: Yes  Endocrine Excessive thirst?: No  Musculoskeletal Back pain?: Yes Joint pain?: Yes  Neurological Headaches?: No Dizziness?: No  Psychologic Depression?: Yes Anxiety?: Yes  Physical Exam: BP 127/79 mmHg  Pulse 75  Ht 5\' 4"  (1.626 m)  Wt 138 lb (62.596 kg)  BMI 23.68 kg/m2  Constitutional:  Alert and oriented, No acute distress. Elderly, ambulating with cane. HEENT: [place] AT, moist mucus membranes.  Trachea midline, no masses. Cardiovascular: No clubbing,  cyanosis, or edema. Respiratory: Normal respiratory effort, no increased work of breathing. GI: Abdomen is soft, nontender, nondistended, no abdominal masses GU: No CVA tenderness.  Skin: No rashes, bruises or suspicious lesions. Neurologic: Grossly intact, no focal deficits, moving all 4 extremities. Psychiatric: Normal mood and affect.  Laboratory Data: Lab Results  Component Value Date   WBC 13.0* 03/17/2015   HGB 10.0* 03/17/2015   HCT 30.9* 03/17/2015   MCV 90.9 03/17/2015   PLT 242 03/17/2015    Lab Results  Component Value Date   CREATININE 0.94 03/17/2015    Urinalysis Results for orders placed or performed in visit on 09/17/15  Microscopic Examination  Result Value Ref Range   WBC, UA >30 (A) 0 -  5 /hpf   RBC, UA None seen 0 -  2 /hpf   Epithelial Cells (non renal) >10 (A) 0 - 10 /hpf   Bacteria, UA  Few None seen/Few  Urinalysis, Complete  Result Value Ref Range   Specific Gravity, UA 1.015 1.005 - 1.030   pH, UA 6.0 5.0 - 7.5   Color, UA Yellow Yellow   Appearance Ur Hazy (A) Clear   Leukocytes, UA 3+ (A) Negative   Protein, UA Trace (A) Negative/Trace   Glucose, UA Negative Negative   Ketones, UA Trace (A) Negative   RBC, UA Trace (A) Negative   Bilirubin, UA Negative Negative   Urobilinogen, Ur 0.2 0.2 - 1.0 mg/dL   Nitrite, UA Negative Negative   Microscopic Examination See below:   BLADDER SCAN AMB NON-IMAGING  Result Value Ref Range   Scan Result 46ml     Pertinent Imaging: n/a  Assessment & Plan:    1. Recurrent UTI Recommend resuming Estrace cream, reviewed usage, pea sized amount per urethra tid.  Sample and script given today. Advised to call our office if symptomatic for CATHED specimen and treatment UCx today based in UA, will treat as needed but asymptomatic  - Urinalysis, Complete - BLADDER SCAN AMB NON-IMAGING - CULTURE, URINE COMPREHENSIVE  2. History of urinary retention Good PVR today, voiding well Recommend double  voiding  3. Mixed incontinence Discussed behavioral modification  Reviewed possible medical management for urge incontinence- Mybetriq.  Not interested.  4. Prolapse of female pelvic organs Managed with pessary Due for cleaning Advised to f/u with Ob/GYN- Dr. Valentino Saxon   Return in about 1 year (around 09/16/2016) for shannon for recurrent UTIs.  Vanna [place], MD  [facility] [address] [place], [place] [address] [phone]

## 2015-09-18 DIAGNOSIS — M4806 Spinal stenosis, lumbar region: Secondary | ICD-10-CM | POA: Diagnosis not present

## 2015-09-18 DIAGNOSIS — M5416 Radiculopathy, lumbar region: Secondary | ICD-10-CM | POA: Diagnosis not present

## 2015-09-18 DIAGNOSIS — S32040A Wedge compression fracture of fourth lumbar vertebra, initial encounter for closed fracture: Secondary | ICD-10-CM | POA: Diagnosis not present

## 2015-09-18 DIAGNOSIS — M4726 Other spondylosis with radiculopathy, lumbar region: Secondary | ICD-10-CM | POA: Diagnosis not present

## 2015-09-18 DIAGNOSIS — M5136 Other intervertebral disc degeneration, lumbar region: Secondary | ICD-10-CM | POA: Diagnosis not present

## 2015-09-19 LAB — CULTURE, URINE COMPREHENSIVE

## 2015-09-21 ENCOUNTER — Encounter: Payer: Self-pay | Admitting: Urology

## 2015-09-23 DIAGNOSIS — F4321 Adjustment disorder with depressed mood: Secondary | ICD-10-CM | POA: Diagnosis not present

## 2015-09-26 ENCOUNTER — Telehealth: Payer: Self-pay

## 2015-09-26 NOTE — Telephone Encounter (Signed)
Pt called wanting to know the results of ucx from last week. Pt stated that she was having some slight burning on urination. Per Dr. Apolinar Junes ucx was contaminated and pt will need to come back in for a cath specimen if having urinary symptoms.  Spoke with pt in reference to ucx results and made aware Dr. Apolinar Junes suggested she have a cath specimen. Pt stated that she did not want to have a cath specimen at this time. Reinforced with pt should her symptoms gets worse to let us know. Pt voiced understanding.

## 2015-09-29 ENCOUNTER — Emergency Department: Payer: PPO

## 2015-09-29 ENCOUNTER — Emergency Department
Admission: EM | Admit: 2015-09-29 | Discharge: 2015-09-29 | Disposition: A | Discharge status: Home / Self Care | Payer: PPO | Attending: Emergency Medicine | Admitting: Emergency Medicine

## 2015-09-29 DIAGNOSIS — N390 Urinary tract infection, site not specified: Secondary | ICD-10-CM | POA: Diagnosis not present

## 2015-09-29 DIAGNOSIS — I509 Heart failure, unspecified: Secondary | ICD-10-CM | POA: Diagnosis not present

## 2015-09-29 DIAGNOSIS — I4891 Unspecified atrial fibrillation: Secondary | ICD-10-CM | POA: Insufficient documentation

## 2015-09-29 DIAGNOSIS — Z95810 Presence of automatic (implantable) cardiac defibrillator: Secondary | ICD-10-CM | POA: Insufficient documentation

## 2015-09-29 DIAGNOSIS — H539 Unspecified visual disturbance: Secondary | ICD-10-CM | POA: Diagnosis not present

## 2015-09-29 DIAGNOSIS — Z7901 Long term (current) use of anticoagulants: Secondary | ICD-10-CM | POA: Insufficient documentation

## 2015-09-29 DIAGNOSIS — E875 Hyperkalemia: Secondary | ICD-10-CM | POA: Insufficient documentation

## 2015-09-29 DIAGNOSIS — R9431 Abnormal electrocardiogram [ECG] [EKG]: Secondary | ICD-10-CM | POA: Diagnosis not present

## 2015-09-29 DIAGNOSIS — H538 Other visual disturbances: Secondary | ICD-10-CM | POA: Diagnosis not present

## 2015-09-29 DIAGNOSIS — R413 Other amnesia: Secondary | ICD-10-CM | POA: Diagnosis not present

## 2015-09-29 DIAGNOSIS — I252 Old myocardial infarction: Secondary | ICD-10-CM | POA: Diagnosis not present

## 2015-09-29 DIAGNOSIS — E039 Hypothyroidism, unspecified: Secondary | ICD-10-CM | POA: Diagnosis not present

## 2015-09-29 DIAGNOSIS — Z79899 Other long term (current) drug therapy: Secondary | ICD-10-CM | POA: Diagnosis not present

## 2015-09-29 DIAGNOSIS — I429 Cardiomyopathy, unspecified: Secondary | ICD-10-CM | POA: Diagnosis not present

## 2015-09-29 DIAGNOSIS — R6889 Other general symptoms and signs: Secondary | ICD-10-CM | POA: Diagnosis not present

## 2015-09-29 DIAGNOSIS — Z86010 Personal history of colonic polyps: Secondary | ICD-10-CM | POA: Insufficient documentation

## 2015-09-29 DIAGNOSIS — I110 Hypertensive heart disease with heart failure: Secondary | ICD-10-CM | POA: Diagnosis not present

## 2015-09-29 LAB — COMPREHENSIVE METABOLIC PANEL
ALT: 14 U/L (ref 14–54)
ANION GAP: 6 (ref 5–15)
AST: 22 U/L (ref 15–41)
Albumin: 4 g/dL (ref 3.5–5.0)
Alkaline Phosphatase: 56 U/L (ref 38–126)
BUN: 22 mg/dL — ABNORMAL HIGH (ref 6–20)
CALCIUM: 9.4 mg/dL (ref 8.9–10.3)
CHLORIDE: 102 mmol/L (ref 101–111)
CO2: 27 mmol/L (ref 22–32)
Creatinine, Ser: 1.13 mg/dL — ABNORMAL HIGH (ref 0.44–1.00)
GFR calc non Af Amer: 43 mL/min — ABNORMAL LOW (ref >60)
GFR, EST AFRICAN AMERICAN: 50 mL/min — AB (ref >60)
Glucose, Bld: 104 mg/dL — ABNORMAL HIGH (ref 65–99)
POTASSIUM: 5.5 mmol/L — AB (ref 3.5–5.1)
SODIUM: 135 mmol/L (ref 135–145)
Total Bilirubin: 0.8 mg/dL (ref 0.3–1.2)
Total Protein: 6.9 g/dL (ref 6.5–8.1)

## 2015-09-29 LAB — URINALYSIS COMPLETE WITH MICROSCOPIC (ARMC ONLY)
BACTERIA UA: NONE SEEN
BILIRUBIN URINE: NEGATIVE
GLUCOSE, UA: NEGATIVE mg/dL
Ketones, ur: NEGATIVE mg/dL
Nitrite: NEGATIVE
PH: 6 (ref 5.0–8.0)
Protein, ur: NEGATIVE mg/dL
Specific Gravity, Urine: 1.011 (ref 1.005–1.030)

## 2015-09-29 LAB — GLUCOSE, CAPILLARY: Glucose-Capillary: 93 mg/dL (ref 65–99)

## 2015-09-29 LAB — CBC
HCT: 34.7 % — ABNORMAL LOW (ref 35.0–47.0)
Hemoglobin: 11.4 g/dL — ABNORMAL LOW (ref 12.0–16.0)
MCH: 32 pg (ref 26.0–34.0)
MCHC: 32.7 g/dL (ref 32.0–36.0)
MCV: 97.7 fL (ref 80.0–100.0)
PLATELETS: 149 10*3/uL — AB (ref 150–440)
RBC: 3.55 MIL/uL — ABNORMAL LOW (ref 3.80–5.20)
RDW: 15.3 % — AB (ref 11.5–14.5)
WBC: 5.5 10*3/uL (ref 3.6–11.0)

## 2015-09-29 LAB — TROPONIN I

## 2015-09-29 MED ORDER — OXYCODONE-ACETAMINOPHEN 5-325 MG PO TABS
0.5000 | ORAL_TABLET | Freq: Once | ORAL | Status: AC
  Administered 2015-09-29: 0.5 via ORAL

## 2015-09-29 MED ORDER — DEXTROSE 5 % IV SOLN
1.0000 g | Freq: Once | INTRAVENOUS | Status: AC
  Administered 2015-09-29: 1 g via INTRAVENOUS
  Filled 2015-09-29: qty 10

## 2015-09-29 MED ORDER — CEPHALEXIN 500 MG PO CAPS: 500.0000 mg | ORAL_CAPSULE | Freq: Two times a day (BID) | ORAL | Status: DC

## 2015-09-29 MED ORDER — OXYCODONE-ACETAMINOPHEN 5-325 MG PO TABS
ORAL_TABLET | ORAL | Status: AC
  Administered 2015-09-29: 0.5 via ORAL
  Filled 2015-09-29: qty 1

## 2015-09-29 NOTE — Discharge Instructions (Signed)
Your prescription was called in and should be delivered tomorrow. As we discussed please follow up with your doctor tomorrow. Please seek medical attention for any high fevers, chest pain, shortness of breath, change in behavior, persistent vomiting, bloody stool or any other new or concerning symptoms.   Urinary Tract Infection A urinary tract infection (UTI) can occur any place along the urinary tract. The tract includes the kidneys, ureters, bladder, and urethra. A type of germ called bacteria often causes a UTI. UTIs are often helped with antibiotic medicine.  HOME CARE   If given, take antibiotics as told by your doctor. Finish them even if you start to feel better.  Drink enough fluids to keep your pee (urine) clear or pale yellow.  Avoid tea, drinks with caffeine, and bubbly (carbonated) drinks.  Pee often. Avoid holding your pee in for a long time.  Pee before and after having sex (intercourse).  Wipe from front to back after you poop (bowel movement) if you are a woman. Use each tissue only once. GET HELP RIGHT AWAY IF:   You have back pain.  You have lower belly (abdominal) pain.  You have chills.  You feel sick to your stomach (nauseous).  You throw up (vomit).  Your burning or discomfort with peeing does not go away.  You have a fever.  Your symptoms are not better in 3 days. MAKE SURE YOU:   Understand these instructions.  Will watch your condition.  Will get help right away if you are not doing well or get worse.   This information is not intended to replace advice given to you by your health care provider. Make sure you discuss any questions you have with your health care provider.   Document Released: 09/29/2007 Document Revised: 05/03/2014 Document Reviewed: 11/11/2011 Elsevier Interactive Patient Education [employer]

## 2015-09-29 NOTE — ED Provider Notes (Signed)
[facility] Emergency Department Provider Note    ____________________________________________  Time seen: On EMS arrival  I have reviewed the triage vital signs and the nursing notes.   HISTORY  Chief Complaint Altered Mental Status   History limited by: Not Limited   HPI Joanna Roberts is a 82 y.o. female presents to the emergency department today via EMS. Just prior to EMS arrival the patient complained that her vision change. She describes spots in her vision. She also describes some blurry vision. In addition she had issues with memory. She was unable to remember the name of one of her good friends. At the time of EMS arrival she was still having some confusion. She was unable to recall that she is currently being treated for UTI with Bactrim. At the time of my examination however the patient's memory appears to be intact. She denies any further vision change. She denied any headache at the time of the episode. Denied any chest pain or palpitations. Denies similar symptoms in the past.    Past Medical History  Diagnosis Date  . CHF (congestive heart failure) (HCC)   . Hypertension   . AICD (automatic cardioverter/defibrillator) present   . Presence of permanent cardiac pacemaker   . Myocardial infarction (HCC)   . Dysrhythmia     Patient Active Problem List   Diagnosis Date Noted  . Acquired hypothyroidism 09/17/2015  . Atrial fibrillation (HCC) 09/17/2015  . Cardiomyopathy (HCC) 09/17/2015  . Back pain, chronic 09/17/2015  . DD (diverticular disease) 09/17/2015  . Aggrieved 09/17/2015  . 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 History of colon polyps 09/17/2015  . Personal history of urinary infection 09/17/2015  . Cannot sleep 09/17/2015  . Esophagitis, reflux 09/17/2015  . Tachycardia-bradycardia (HCC) 09/17/2015  . A-fib (HCC) 03/18/2015  . Pneumonia 03/11/2015  . B12 deficiency 11/27/2014  . Chronic anemia 11/26/2014  . Incomplete bladder  emptying 06/24/2014  . Frequent UTI 06/24/2014  . History of urinary anomaly 06/21/2014  . FOM (frequency of micturition) 06/21/2014  . Vascular disorder of lower extremity 04/25/2014  . Chronic abdominal pain 01/17/2014  . CN (constipation) 01/17/2014  . Fatty infiltration of liver 01/17/2014  . Pain in rib 11/06/2013  . Temporary cerebral vascular dysfunction 11/06/2013    Past Surgical History  Procedure Laterality Date  . Hernia repair    . Insert / replace / remove pacemaker    . Appendectomy    . Abdominal hysterectomy    . Peripheral vascular catheterization Right 10/08/2014    Procedure: Lower Extremity Angiography;  Surgeon: Renford Dills, MD;  Location: [facility] INVASIVE CV LAB;  Service: Cardiovascular;  Laterality: Right;  . Peripheral vascular catheterization  10/08/2014    Procedure: Lower Extremity Intervention;  Surgeon: Renford Dills, MD;  Location: [facility] INVASIVE CV LAB;  Service: Cardiovascular;;    Current Outpatient Rx  Name  Route  Sig  Dispense  Refill  . carvedilol (COREG) 25 MG tablet   Oral   Take 25 mg by mouth 2 (two) times daily.          Marland Kitchen conjugated estrogens (PREMARIN) vaginal cream   Vaginal   Place 1 Applicatorful vaginally every Monday, Wednesday, and Friday at 8 PM. Small pea sized amount per urethra   42.5 g   12   . docusate sodium (COLACE) 100 MG capsule   Oral   Take 100 mg by mouth at bedtime as needed for mild constipation.         Marland Kitchen  docusate sodium (COLACE) 100 MG capsule   Oral   Take 1 capsule (100 mg total) by mouth 2 (two) times daily as needed (take to keep stool soft.).   60 capsule   0   . furosemide (LASIX) 20 MG tablet   Oral   Take 1 tablet (20 mg total) by mouth daily.   30 tablet   0   . levothyroxine (SYNTHROID, LEVOTHROID) 25 MCG tablet   Oral   Take 25 mcg by mouth daily before breakfast.         . losartan (COZAAR) 25 MG tablet   Oral   Take 25 mg by mouth daily.         . nitroGLYCERIN  (NITROSTAT) 0.4 MG SL tablet   Sublingual   Place 0.4 mg under the tongue every 5 (five) minutes as needed for chest pain.         Marland Kitchen oxyCODONE-acetaminophen (PERCOCET/ROXICET) 5-325 MG tablet   Oral   Take 1 tablet by mouth every 4 (four) hours as needed for severe pain.         Marland Kitchen temazepam (RESTORIL) 15 MG capsule   Oral   Take 15 mg by mouth at bedtime as needed for sleep.         Marland Kitchen venlafaxine XR (EFFEXOR-XR) 37.5 MG 24 hr capsule   Oral   Take 37.5 mg by mouth daily.         Marland Kitchen warfarin (COUMADIN) 3 MG tablet   Oral   Take 1.5 mg by mouth at bedtime.            Allergies Lortab; Macrobid; and Nitrofurantoin  No family history on file.  Social History Social History  Substance Use Topics  . Smoking status: Never Smoker   . Smokeless tobacco: Never Used  . Alcohol Use: No    Review of Systems  Constitutional: Negative for fever. Cardiovascular: Negative for chest pain. Respiratory: Negative for shortness of breath. Gastrointestinal: Negative for abdominal pain, vomiting and diarrhea. Neurological: Positive for vision change, memory change  10-point ROS otherwise negative.  ____________________________________________   PHYSICAL EXAM:  VITAL SIGNS: ED Triage Vitals  Enc Vitals Group     BP 09/29/15 1621 169/108 mmHg     Pulse Rate 09/29/15 1621 70     Resp 09/29/15 1621 18     Temp 09/29/15 1625 98.3 F (36.8 C)     Temp Source 09/29/15 1621 Oral     SpO2 09/29/15 1621 99 %     Weight 09/29/15 1621 140 lb (63.504 kg)   Constitutional: Alert and oriented. Well appearing and in no distress. Eyes: Conjunctivae are normal. PERRL. Normal extraocular movements. ENT   Head: Normocephalic and atraumatic.   Nose: No congestion/rhinnorhea.   Mouth/Throat: Mucous membranes are moist.   Neck: No stridor. Hematological/Lymphatic/Immunilogical: No cervical lymphadenopathy. Cardiovascular: Normal rate, regular rhythm.  No murmurs, rubs, or  gallops. Respiratory: Normal respiratory effort without tachypnea nor retractions. Breath sounds are clear and equal bilaterally. No wheezes/rales/rhonchi. Gastrointestinal: Soft and nontender. No distention. There is no CVA tenderness. Genitourinary: Deferred Musculoskeletal: Normal range of motion in all extremities. No joint effusions.  No lower extremity tenderness nor edema. Neurologic:  Normal speech and language. Face symmetric. Tongue midline. EOMI. PERRL. No pronator drift. No lower extremity drift. No gross focal neurologic deficits are appreciated.  Skin:  Skin is warm, dry and intact. No rash noted. Psychiatric: Mood and affect are normal. Speech and behavior are normal. Patient exhibits appropriate insight  and judgment.  ____________________________________________    LABS (pertinent positives/negatives)  Labs Reviewed  URINALYSIS COMPLETEWITH MICROSCOPIC ([facility] ONLY) - Abnormal; Notable for the following:    Color, Urine YELLOW (*)    APPearance HAZY (*)    Hgb urine dipstick 1+ (*)    Leukocytes, UA 3+ (*)    Squamous Epithelial / LPF 0-5 (*)    All other components within normal limits  CBC - Abnormal; Notable for the following:    RBC 3.55 (*)    Hemoglobin 11.4 (*)    HCT 34.7 (*)    RDW 15.3 (*)    Platelets 149 (*)    All other components within normal limits  COMPREHENSIVE METABOLIC PANEL - Abnormal; Notable for the following:    Potassium 5.5 (*)    Glucose, Bld 104 (*)    BUN 22 (*)    Creatinine, Ser 1.13 (*)    GFR calc non Af Amer 43 (*)    GFR calc Af Amer 50 (*)    All other components within normal limits  URINE CULTURE  TROPONIN I  GLUCOSE, CAPILLARY  CBG MONITORING, ED     ____________________________________________   EKG  I, Phineas Semen, attending physician, personally viewed and interpreted this EKG  EKG Time: 1629 Rate: 70 Rhythm: ventricular paced rhythm Axis: left axis deviation Intervals: qtc 466 QRS: wide, paced ST  changes: no st elevation equivalent Impression: abnormal ekg   ____________________________________________    RADIOLOGY  CT head IMPRESSION: No acute abnormality.  ____________________________________________   PROCEDURES  Procedure(s) performed: None  Critical Care performed: No  ____________________________________________   INITIAL IMPRESSION / ASSESSMENT AND PLAN / ED COURSE  Pertinent labs & imaging results that were available during my care of the patient were reviewed by me and considered in my medical decision making (see chart for details).  Patient presented to the emergency department today because of concerns for vision change and memory change. At this time of examination the symptoms have now resolved. She is currently being treated for a urinary tract infection. Will plan on getting blood work, urine and head CT to evaluate.  ----------------------------------------- 6:31 PM on 09/29/2015 -----------------------------------------  Head CT without any acute findings. Urine did show too numerous to count white blood cells. Blood work without any overly concerning findings except a slightly elevated potassium. No leukocytosis. At this point I think patient likely still with a urinary tract infection. Will send off for a urine culture. I had a long discussion with the patient about both IV antibiotics for UTI line as well as possible TIA as a cause or symptoms. I did offer the patient admission and explained to the patient that if it was a TIA that she could have a true stroke that caused her to have a lasting neurologic deficit. I did recommend admission to this patient and did explain to her that that would certainly be the safest option. The patient however declined admission. She stated she would rather go home. She did verbalize understanding of my concerns. This point will plan on giving a dose of IV antibiotics here and further outpatient antibiotics.  Additionally instructed patient to follow up with PCP tomorrow, which patient has agreed to.   ____________________________________________   FINAL CLINICAL IMPRESSION(S) / ED DIAGNOSES  Final diagnoses:  Vision changes  UTI (lower urinary tract infection)  Hyperkalemia     Note: This dictation was prepared with Dragon dictation. Any transcriptional errors that result from this process are unintentional  Phineas Semen, MD 09/29/15 1919

## 2015-09-29 NOTE — ED Notes (Signed)
Pt assisted to bathroom

## 2015-09-29 NOTE — ED Notes (Signed)
Pt 1 person assist to bathroom. PT back to bed at this time.

## 2015-09-29 NOTE — ED Notes (Signed)
Vision back to normal at this time.

## 2015-09-29 NOTE — ED Notes (Signed)
Pt arrives to ER via [facility] from [facility]. Pt states she had episode where her vision was blurry and she couldn't remember her friend's name. Pt called friend who called EMS. Pt alert and oriented X4, active, cooperative, pt in NAD. RR even and unlabored, color WNL.

## 2015-09-29 NOTE — ED Notes (Signed)
MD at bedside. 

## 2015-09-29 NOTE — ED Notes (Signed)
Patient transported to CT 

## 2015-10-01 LAB — URINE CULTURE

## 2015-10-07 DIAGNOSIS — I482 Chronic atrial fibrillation: Secondary | ICD-10-CM | POA: Diagnosis not present

## 2015-10-08 DIAGNOSIS — K439 Ventral hernia without obstruction or gangrene: Secondary | ICD-10-CM | POA: Diagnosis not present

## 2015-10-08 DIAGNOSIS — K648 Other hemorrhoids: Secondary | ICD-10-CM | POA: Diagnosis not present

## 2015-10-09 ENCOUNTER — Ambulatory Visit: Payer: PPO | Admitting: Urology

## 2015-10-09 DIAGNOSIS — I70261 Atherosclerosis of native arteries of extremities with gangrene, right leg: Secondary | ICD-10-CM | POA: Diagnosis not present

## 2015-10-09 DIAGNOSIS — M7989 Other specified soft tissue disorders: Secondary | ICD-10-CM | POA: Diagnosis not present

## 2015-10-09 DIAGNOSIS — E669 Obesity, unspecified: Secondary | ICD-10-CM | POA: Diagnosis not present

## 2015-10-09 DIAGNOSIS — M79609 Pain in unspecified limb: Secondary | ICD-10-CM | POA: Diagnosis not present

## 2015-10-09 DIAGNOSIS — I70213 Atherosclerosis of native arteries of extremities with intermittent claudication, bilateral legs: Secondary | ICD-10-CM | POA: Diagnosis not present

## 2015-10-09 DIAGNOSIS — E785 Hyperlipidemia, unspecified: Secondary | ICD-10-CM | POA: Diagnosis not present

## 2015-10-09 DIAGNOSIS — I10 Essential (primary) hypertension: Secondary | ICD-10-CM | POA: Diagnosis not present

## 2015-10-09 DIAGNOSIS — L97409 Non-pressure chronic ulcer of unspecified heel and midfoot with unspecified severity: Secondary | ICD-10-CM | POA: Diagnosis not present

## 2015-10-09 DIAGNOSIS — I872 Venous insufficiency (chronic) (peripheral): Secondary | ICD-10-CM | POA: Diagnosis not present

## 2015-10-09 DIAGNOSIS — M1990 Unspecified osteoarthritis, unspecified site: Secondary | ICD-10-CM | POA: Diagnosis not present

## 2015-10-09 DIAGNOSIS — I890 Lymphedema, not elsewhere classified: Secondary | ICD-10-CM | POA: Diagnosis not present

## 2015-10-09 DIAGNOSIS — I8310 Varicose veins of unspecified lower extremity with inflammation: Secondary | ICD-10-CM | POA: Diagnosis not present

## 2015-10-16 DIAGNOSIS — E039 Hypothyroidism, unspecified: Secondary | ICD-10-CM | POA: Diagnosis not present

## 2015-10-16 DIAGNOSIS — I480 Paroxysmal atrial fibrillation: Secondary | ICD-10-CM | POA: Diagnosis not present

## 2015-10-16 DIAGNOSIS — E538 Deficiency of other specified B group vitamins: Secondary | ICD-10-CM | POA: Diagnosis not present

## 2015-10-23 ENCOUNTER — Ambulatory Visit
Admission: RE | Admit: 2015-10-23 | Discharge: 2015-10-23 | Disposition: A | Discharge status: Home / Self Care | Payer: PPO | Source: Ambulatory Visit | Attending: Family Medicine | Admitting: Family Medicine

## 2015-10-23 ENCOUNTER — Other Ambulatory Visit: Payer: Self-pay | Admitting: Family Medicine

## 2015-10-23 DIAGNOSIS — I10 Essential (primary) hypertension: Secondary | ICD-10-CM | POA: Diagnosis not present

## 2015-10-23 DIAGNOSIS — M79661 Pain in right lower leg: Secondary | ICD-10-CM

## 2015-10-23 DIAGNOSIS — R599 Enlarged lymph nodes, unspecified: Secondary | ICD-10-CM | POA: Diagnosis not present

## 2015-10-23 DIAGNOSIS — E538 Deficiency of other specified B group vitamins: Secondary | ICD-10-CM | POA: Diagnosis not present

## 2015-10-23 DIAGNOSIS — G603 Idiopathic progressive neuropathy: Secondary | ICD-10-CM | POA: Diagnosis not present

## 2015-10-23 DIAGNOSIS — M7989 Other specified soft tissue disorders: Secondary | ICD-10-CM

## 2015-10-23 DIAGNOSIS — R600 Localized edema: Secondary | ICD-10-CM

## 2015-10-23 DIAGNOSIS — E039 Hypothyroidism, unspecified: Secondary | ICD-10-CM | POA: Diagnosis not present

## 2015-10-29 DIAGNOSIS — M5136 Other intervertebral disc degeneration, lumbar region: Secondary | ICD-10-CM | POA: Diagnosis not present

## 2015-10-29 DIAGNOSIS — M7061 Trochanteric bursitis, right hip: Secondary | ICD-10-CM | POA: Diagnosis not present

## 2015-10-29 DIAGNOSIS — M4806 Spinal stenosis, lumbar region: Secondary | ICD-10-CM | POA: Diagnosis not present

## 2015-10-29 DIAGNOSIS — M4726 Other spondylosis with radiculopathy, lumbar region: Secondary | ICD-10-CM | POA: Diagnosis not present

## 2015-11-06 DIAGNOSIS — I480 Paroxysmal atrial fibrillation: Secondary | ICD-10-CM | POA: Diagnosis not present

## 2015-11-06 DIAGNOSIS — R590 Localized enlarged lymph nodes: Secondary | ICD-10-CM | POA: Diagnosis not present

## 2015-11-12 DIAGNOSIS — E669 Obesity, unspecified: Secondary | ICD-10-CM | POA: Diagnosis not present

## 2015-11-12 DIAGNOSIS — M79609 Pain in unspecified limb: Secondary | ICD-10-CM | POA: Diagnosis not present

## 2015-11-12 DIAGNOSIS — M1990 Unspecified osteoarthritis, unspecified site: Secondary | ICD-10-CM | POA: Diagnosis not present

## 2015-11-12 DIAGNOSIS — M7989 Other specified soft tissue disorders: Secondary | ICD-10-CM | POA: Diagnosis not present

## 2015-11-12 DIAGNOSIS — I70213 Atherosclerosis of native arteries of extremities with intermittent claudication, bilateral legs: Secondary | ICD-10-CM | POA: Diagnosis not present

## 2015-11-12 DIAGNOSIS — I890 Lymphedema, not elsewhere classified: Secondary | ICD-10-CM | POA: Diagnosis not present

## 2015-11-12 DIAGNOSIS — I8310 Varicose veins of unspecified lower extremity with inflammation: Secondary | ICD-10-CM | POA: Diagnosis not present

## 2015-11-12 DIAGNOSIS — I10 Essential (primary) hypertension: Secondary | ICD-10-CM | POA: Diagnosis not present

## 2015-11-12 DIAGNOSIS — L97409 Non-pressure chronic ulcer of unspecified heel and midfoot with unspecified severity: Secondary | ICD-10-CM | POA: Diagnosis not present

## 2015-11-12 DIAGNOSIS — I70261 Atherosclerosis of native arteries of extremities with gangrene, right leg: Secondary | ICD-10-CM | POA: Diagnosis not present

## 2015-11-12 DIAGNOSIS — I872 Venous insufficiency (chronic) (peripheral): Secondary | ICD-10-CM | POA: Diagnosis not present

## 2015-11-12 DIAGNOSIS — I739 Peripheral vascular disease, unspecified: Secondary | ICD-10-CM | POA: Diagnosis not present

## 2015-11-18 ENCOUNTER — Encounter: Payer: Self-pay | Admitting: Pain Medicine

## 2015-11-18 ENCOUNTER — Ambulatory Visit: Payer: PPO | Attending: Pain Medicine | Admitting: Pain Medicine

## 2015-11-18 VITALS — BP 172/96 | HR 71 | Temp 97.7°F | Ht 63.0 in | Wt 138.0 lb

## 2015-11-18 DIAGNOSIS — R011 Cardiac murmur, unspecified: Secondary | ICD-10-CM | POA: Diagnosis not present

## 2015-11-18 DIAGNOSIS — F418 Other specified anxiety disorders: Secondary | ICD-10-CM | POA: Diagnosis not present

## 2015-11-18 DIAGNOSIS — M1990 Unspecified osteoarthritis, unspecified site: Secondary | ICD-10-CM | POA: Diagnosis not present

## 2015-11-18 DIAGNOSIS — K219 Gastro-esophageal reflux disease without esophagitis: Secondary | ICD-10-CM | POA: Diagnosis not present

## 2015-11-18 DIAGNOSIS — Z950 Presence of cardiac pacemaker: Secondary | ICD-10-CM | POA: Diagnosis not present

## 2015-11-18 DIAGNOSIS — M48062 Spinal stenosis, lumbar region with neurogenic claudication: Secondary | ICD-10-CM | POA: Insufficient documentation

## 2015-11-18 DIAGNOSIS — M4856XA Collapsed vertebra, not elsewhere classified, lumbar region, initial encounter for fracture: Secondary | ICD-10-CM | POA: Diagnosis not present

## 2015-11-18 DIAGNOSIS — M4854XA Collapsed vertebra, not elsewhere classified, thoracic region, initial encounter for fracture: Secondary | ICD-10-CM | POA: Diagnosis not present

## 2015-11-18 DIAGNOSIS — M533 Sacrococcygeal disorders, not elsewhere classified: Secondary | ICD-10-CM | POA: Diagnosis not present

## 2015-11-18 DIAGNOSIS — M79606 Pain in leg, unspecified: Secondary | ICD-10-CM | POA: Diagnosis not present

## 2015-11-18 DIAGNOSIS — M545 Low back pain: Secondary | ICD-10-CM | POA: Diagnosis not present

## 2015-11-18 DIAGNOSIS — M51369 Other intervertebral disc degeneration, lumbar region without mention of lumbar back pain or lower extremity pain: Secondary | ICD-10-CM | POA: Insufficient documentation

## 2015-11-18 DIAGNOSIS — M47817 Spondylosis without myelopathy or radiculopathy, lumbosacral region: Secondary | ICD-10-CM | POA: Diagnosis not present

## 2015-11-18 DIAGNOSIS — I10 Essential (primary) hypertension: Secondary | ICD-10-CM | POA: Insufficient documentation

## 2015-11-18 DIAGNOSIS — E079 Disorder of thyroid, unspecified: Secondary | ICD-10-CM | POA: Insufficient documentation

## 2015-11-18 DIAGNOSIS — K449 Diaphragmatic hernia without obstruction or gangrene: Secondary | ICD-10-CM | POA: Diagnosis not present

## 2015-11-18 DIAGNOSIS — M4806 Spinal stenosis, lumbar region: Secondary | ICD-10-CM | POA: Insufficient documentation

## 2015-11-18 DIAGNOSIS — Z8673 Personal history of transient ischemic attack (TIA), and cerebral infarction without residual deficits: Secondary | ICD-10-CM | POA: Diagnosis not present

## 2015-11-18 DIAGNOSIS — M62830 Muscle spasm of back: Secondary | ICD-10-CM | POA: Insufficient documentation

## 2015-11-18 DIAGNOSIS — I509 Heart failure, unspecified: Secondary | ICD-10-CM | POA: Diagnosis not present

## 2015-11-18 DIAGNOSIS — M5416 Radiculopathy, lumbar region: Secondary | ICD-10-CM | POA: Diagnosis not present

## 2015-11-18 DIAGNOSIS — M419 Scoliosis, unspecified: Secondary | ICD-10-CM | POA: Insufficient documentation

## 2015-11-18 DIAGNOSIS — M5136 Other intervertebral disc degeneration, lumbar region: Secondary | ICD-10-CM | POA: Diagnosis not present

## 2015-11-18 DIAGNOSIS — N200 Calculus of kidney: Secondary | ICD-10-CM | POA: Insufficient documentation

## 2015-11-18 DIAGNOSIS — Z9889 Other specified postprocedural states: Secondary | ICD-10-CM

## 2015-11-18 DIAGNOSIS — M4120 Other idiopathic scoliosis, site unspecified: Secondary | ICD-10-CM

## 2015-11-18 DIAGNOSIS — M47816 Spondylosis without myelopathy or radiculopathy, lumbar region: Secondary | ICD-10-CM

## 2015-11-18 DIAGNOSIS — M5116 Intervertebral disc disorders with radiculopathy, lumbar region: Secondary | ICD-10-CM | POA: Insufficient documentation

## 2015-11-18 DIAGNOSIS — M791 Myalgia: Secondary | ICD-10-CM | POA: Diagnosis not present

## 2015-11-18 NOTE — Progress Notes (Signed)
]    The patient is an 82 year old female who comes to pain management Center at the request of Dr. Maisie Fus gains further evaluation and treatment of pain involving the lumbar lower extremity region especially the right lower extremity region. The patient is with history of degenerative changes of the lumbar spine with lower extremity pain and pain of the lumbar region increasing over the last few years. The patient is with history of degenerative joint disease as well as greater trochanteric bursitis in addition to evidence of severe scoliosis and degenerative lumbar spondylosis with multilevel degenerative disc disease and facet arthritis. Patient also with evidence of T12 compression fracture and with bilateral recess stenosis at L2-3. Patient with moderately severe multifactorial spinal stenosis with bilateral recess stenosis at L4-5 noted is well.. The patient prefers to avoid additional interventional treatment at this time. The patient is with significant medical conditions. We discussed patient's condition and we will consider patient for modification of treatment regimen pending follow-up evaluation and further assessment of patient's condition. The patient also is with history of defibrillator with pacemaker and history of mitral valve regurgitation. All appeared to be in agreement with suggested treatment plan      Cardiovascular: High blood pressure Abnormal heart rhythm Cardiac pacemaker Defibrillator Heart murmur History of congestive heart failure Status post cardiac catheterization  Pulmonary: Unremarkable  Neurological: Scoliosis Status post cerebrovascular accident  Psychological: Anxiety Depression  Gastrointestinal: Hiatal hernia Gastroesophageal reflux disease    Genitourinary: Kidney stones   Hematologic: Unremarkable  Endocrine: Thyroid disease   Rheumatological: Arthritis   Musculoskeletal: Unremarkable  Other  significant: Unremarkable     Physical examination   There was tenderness to palpation of the splenius capitis and occipitalis region a moderate degree with moderate tenderness of the cervical and thoracic facet region a moderate muscle spasms noted in the lower thoracic paraspinal musculature region. The patient appeared to be with unremarkable Spurling's maneuver. Tinel and Phalen's maneuver were with increased pain of mild-to-moderate degree and patient was a slightly decreased grip strength. Palpation over the region of the thoracic facet thoracic paraspinal musculature region was without crepitus of the thoracic region noted. Palpation over the lumbar paraspinal musculatures and lumbar facet region was with moderate to moderately severe increased pain with lateral bending rotation extension and palpation of the lumbar facets region. Palpation of the PSIS and PII S region reproduced moderate discomfort. There was moderate tenderness of the gluteal and piriformis musculature region. Straight leg raising limited to approximately 30 without definite increased pain with dorsiflexion noted. EHL strength decreased. There was negative clonus negative Homans. Knees were with tenderness to palpation. EHL strength appeared to be decreased. There was negative clonus negative Homans.    Assessment   DDD lumbar spine  Lumbar stenosis with neurogenic claudication  Lumbar radiculopathy  Lumbar facet syndrome  Scoliosis  T12 compression fracture  L3 compression fracture  DDD thoracic spine  Thoracic facet syndrome  Status post vein stripping of lower extremities  Sacroiliac joint dysfunction  Nephrolithiasis       PLAN  Continue present medications Neurontin and hydrocodone acetaminophen  F/U PCP  for evaluation of  BP and general medical  condition  F/U surgical evaluation. May consider pending follow-up evaluations. Patient without desire to consider surgical evaluation for  surgical intervention due to general medical condition  F/U neurological evaluation. May consider pending follow-up  evaluations. We will avoid considering such studies at this time  May consider radiofrequency rhizolysis or intraspinal procedures pending response to present  treatment and F/U evaluation . We will avoid considering such treatment for this patient  Patient to call Pain Management Center should patient have concerns prior to scheduled return appointment.

## 2015-11-18 NOTE — Patient Instructions (Signed)
   PLAN  Continue present medication  F/U PCP  for evaluation of  BP and general medical  condition  F/U surgical evaluation. May consider pending follow-up evaluations  F/U neurological evaluation. May consider pending follow-up evaluations  May consider radiofrequency rhizolysis or intraspinal procedures pending response to present treatment and F/U evaluation   Patient to call Pain Management Center should patient have concerns prior to scheduled return appointment.

## 2015-11-20 ENCOUNTER — Other Ambulatory Visit: Payer: Self-pay

## 2015-11-27 LAB — TOXASSURE SELECT 13 (MW), URINE: PDF: 0

## 2015-11-27 NOTE — Progress Notes (Signed)
Reviewed

## 2015-12-02 ENCOUNTER — Encounter: Payer: Self-pay | Admitting: Obstetrics and Gynecology

## 2015-12-02 ENCOUNTER — Ambulatory Visit (INDEPENDENT_AMBULATORY_CARE_PROVIDER_SITE_OTHER): Payer: PPO | Admitting: Obstetrics and Gynecology

## 2015-12-02 VITALS — BP 136/85 | HR 70 | Ht 63.0 in | Wt 139.9 lb

## 2015-12-02 DIAGNOSIS — R399 Unspecified symptoms and signs involving the genitourinary system: Secondary | ICD-10-CM

## 2015-12-02 DIAGNOSIS — Z4689 Encounter for fitting and adjustment of other specified devices: Secondary | ICD-10-CM

## 2015-12-02 DIAGNOSIS — N952 Postmenopausal atrophic vaginitis: Secondary | ICD-10-CM | POA: Diagnosis not present

## 2015-12-02 DIAGNOSIS — N8110 Cystocele, unspecified: Secondary | ICD-10-CM

## 2015-12-02 DIAGNOSIS — N816 Rectocele: Secondary | ICD-10-CM | POA: Diagnosis not present

## 2015-12-02 LAB — POCT URINALYSIS DIPSTICK
BILIRUBIN UA: NEGATIVE
GLUCOSE UA: NEGATIVE
Ketones, UA: NEGATIVE
NITRITE UA: NEGATIVE
Protein, UA: NEGATIVE
Spec Grav, UA: 1.015
UROBILINOGEN UA: 0.2
pH, UA: 6

## 2015-12-02 MED ORDER — MIRABEGRON ER 25 MG PO TB24: 25.0000 mg | ORAL_TABLET | Freq: Every day | ORAL | Status: DC

## 2015-12-02 NOTE — Progress Notes (Signed)
   GYNECOLOGY PROGRESS NOTE  Subjective:    Patient ID: Joanna Roberts, female    DOB: [birth year], 82 y.o.   MRN: [id number]  HPI  Patient is a 82 y.o. [id number] female who presents for pessary check.  Patient with h/o vaginal prolapse/cystocele (Grade 3).   Of note patient has not been seen by current office since 03/2015. Had previously relocated to [place] with her daughter and did not find a GYN,.  After relocating back to [place], patient notes that she had many other appointments and had to put this on the back burner.   Does note that it has been several months since last pessary check.    The following portions of the patient's history were reviewed and updated as appropriate: allergies, current medications, past family history, past medical history, past social history, past surgical history and problem list.  Review of Systems A comprehensive review of systems was negative except for: Genitourinary: positive for dysuria and frequency  Notes being treated for a UTI almost 1-2 months ago.  Objective:   Blood pressure 136/85, pulse 70, height 5\' 3"  (1.6 m), weight 139 lb 14.4 oz (63.5 kg). General appearance: alert and no distress Abdomen: soft, non-tender; bowel sounds normal; no masses,  no organomegaly Pelvic: The patient's Gelhorn 3 1/4 pessary was removed, cleaned and replaced without complications. Speculum examination revealed atrophic vaginal mucosa with no lesions or lacerations. Extremities: varicose veins noted and venous stasis dermatitis noted Neurologic: Grossly normal   Labs:  Results for orders placed or performed in visit on 12/02/15  POCT urinalysis dipstick  Result Value Ref Range   Color, UA yellow    Clarity, UA clear    Glucose, UA neg    Bilirubin, UA neg    Ketones, UA neg    Spec Grav, UA 1.015    Blood, UA NHT    pH, UA 6.0    Protein, UA neg    Urobilinogen, UA 0.2    Nitrite, UA neg    Leukocytes, UA moderate (2+) (A) Negative     Assessment:    Cystocele/vaginal prolapse, Grade III Rectocele Grade II Vaginal atrophy Pessary maintenance UTI symptoms  Plan:   - Continue to use Trimo-San gel as prescribed. Given samples of Premarin cream.  - UA today shows no nitrites, only 2+ leukocytes.  Due to symptoms and h/o prior infection, will still send urine culture. Discussed adequate hydration, Azo prn.  - Strongly encouraged patient to come in for fittings q 2-3 months as scheduled.     02/01/16, MD [facility]

## 2015-12-04 DIAGNOSIS — I480 Paroxysmal atrial fibrillation: Secondary | ICD-10-CM | POA: Diagnosis not present

## 2015-12-04 LAB — URINE CULTURE

## 2015-12-05 ENCOUNTER — Telehealth: Payer: Self-pay

## 2015-12-05 DIAGNOSIS — N390 Urinary tract infection, site not specified: Principal | ICD-10-CM

## 2015-12-05 DIAGNOSIS — B964 Proteus (mirabilis) (morganii) as the cause of diseases classified elsewhere: Secondary | ICD-10-CM

## 2015-12-05 MED ORDER — CIPROFLOXACIN HCL 250 MG PO TABS: 250.0000 mg | ORAL_TABLET | Freq: Two times a day (BID) | ORAL | 0 refills | Status: DC

## 2015-12-05 NOTE — Telephone Encounter (Signed)
-----   Message from Hildred Laser, MD sent at 12/05/2015  9:06 AM EDT ----- Please inform patient of developing UTI (Proteus). Can prescribe Cipro 250 mg BID x 3 days.

## 2015-12-05 NOTE — Telephone Encounter (Signed)
Called pt no answer. LM for pt informing her of UTI and the need for treatment. Medication sent in.

## 2015-12-08 ENCOUNTER — Telehealth: Payer: Self-pay | Admitting: Obstetrics and Gynecology

## 2015-12-08 DIAGNOSIS — N390 Urinary tract infection, site not specified: Principal | ICD-10-CM

## 2015-12-08 DIAGNOSIS — B964 Proteus (mirabilis) (morganii) as the cause of diseases classified elsewhere: Secondary | ICD-10-CM

## 2015-12-08 NOTE — Telephone Encounter (Signed)
That's fine.  We can extend her to a 14 day course of therapy.

## 2015-12-08 NOTE — Telephone Encounter (Signed)
Please advise 

## 2015-12-08 NOTE — Telephone Encounter (Signed)
PT CALLED AND SHE WAS GIVEN 3 DAY SUPPLY FOR HER UTI AND SHE NEEDS IT FOR AT LEAST 14 DAYS SHE STATED THAT IN THE PAST WHEN SHE HAS DONE A 3 DAY THAT IT HELPS BUT DOESN'T GET RID OF IT AND SHE HAS TO END UP TAKING ANOTHER DOSE FOR LONGER.

## 2015-12-09 ENCOUNTER — Encounter: Payer: Self-pay | Admitting: Pain Medicine

## 2015-12-09 ENCOUNTER — Ambulatory Visit: Payer: PPO | Attending: Pain Medicine | Admitting: Pain Medicine

## 2015-12-09 VITALS — BP 114/63 | HR 77 | Temp 98.5°F | Resp 18 | Ht 63.0 in | Wt 139.0 lb

## 2015-12-09 DIAGNOSIS — M47817 Spondylosis without myelopathy or radiculopathy, lumbosacral region: Secondary | ICD-10-CM | POA: Diagnosis not present

## 2015-12-09 DIAGNOSIS — F4321 Adjustment disorder with depressed mood: Secondary | ICD-10-CM | POA: Diagnosis not present

## 2015-12-09 DIAGNOSIS — M5116 Intervertebral disc disorders with radiculopathy, lumbar region: Secondary | ICD-10-CM | POA: Insufficient documentation

## 2015-12-09 DIAGNOSIS — M5134 Other intervertebral disc degeneration, thoracic region: Secondary | ICD-10-CM | POA: Diagnosis not present

## 2015-12-09 DIAGNOSIS — M62830 Muscle spasm of back: Secondary | ICD-10-CM | POA: Insufficient documentation

## 2015-12-09 DIAGNOSIS — N200 Calculus of kidney: Secondary | ICD-10-CM | POA: Diagnosis not present

## 2015-12-09 DIAGNOSIS — Z7901 Long term (current) use of anticoagulants: Secondary | ICD-10-CM | POA: Diagnosis not present

## 2015-12-09 DIAGNOSIS — M533 Sacrococcygeal disorders, not elsewhere classified: Secondary | ICD-10-CM | POA: Insufficient documentation

## 2015-12-09 DIAGNOSIS — M4854XA Collapsed vertebra, not elsewhere classified, thoracic region, initial encounter for fracture: Secondary | ICD-10-CM

## 2015-12-09 DIAGNOSIS — M5136 Other intervertebral disc degeneration, lumbar region: Secondary | ICD-10-CM

## 2015-12-09 DIAGNOSIS — M4856XA Collapsed vertebra, not elsewhere classified, lumbar region, initial encounter for fracture: Secondary | ICD-10-CM | POA: Diagnosis not present

## 2015-12-09 DIAGNOSIS — M4806 Spinal stenosis, lumbar region: Secondary | ICD-10-CM | POA: Insufficient documentation

## 2015-12-09 DIAGNOSIS — M5416 Radiculopathy, lumbar region: Secondary | ICD-10-CM | POA: Diagnosis not present

## 2015-12-09 DIAGNOSIS — M419 Scoliosis, unspecified: Secondary | ICD-10-CM | POA: Diagnosis not present

## 2015-12-09 DIAGNOSIS — M545 Low back pain: Secondary | ICD-10-CM | POA: Diagnosis not present

## 2015-12-09 DIAGNOSIS — M4120 Other idiopathic scoliosis, site unspecified: Secondary | ICD-10-CM

## 2015-12-09 DIAGNOSIS — M79606 Pain in leg, unspecified: Secondary | ICD-10-CM | POA: Diagnosis not present

## 2015-12-09 DIAGNOSIS — M791 Myalgia: Secondary | ICD-10-CM | POA: Diagnosis not present

## 2015-12-09 DIAGNOSIS — M51369 Other intervertebral disc degeneration, lumbar region without mention of lumbar back pain or lower extremity pain: Secondary | ICD-10-CM

## 2015-12-09 MED ORDER — CIPROFLOXACIN HCL 250 MG PO TABS: 250.0000 mg | ORAL_TABLET | Freq: Two times a day (BID) | ORAL | 0 refills | Status: AC

## 2015-12-09 MED ORDER — HYDROCODONE-ACETAMINOPHEN 5-325 MG PO TABS: ORAL_TABLET | ORAL | 0 refills | Status: DC

## 2015-12-09 NOTE — Progress Notes (Signed)
    The patient is an 82 year old female who returns to pain management for further evaluation and treatment of pain involving the lumbar and lower extremity region predominantly the patient is with significant cardiac history and continues anticoagulant therapy. Decision has been made to avoid interventional treatment. On today's visit we reviewed patient's medications and we will have patient continue her Neurontin 300 mg at bedtime and at time of refill of the Neurontin we will decrease Neurontin from a 300 mg size to a 100 mg size. The patient denied any excessive drowsiness confusion and other unresolved side effects with the Neurontin 300 mg once per day taken in the evening. The patient will continue hydrocodone acetaminophen 5/325 limit 2-3 per day. We will consider additional modifications of treatment pending follow-up evaluation. The patient denied any drowsiness confusion and other unresolved side effects and stated that she was without any trauma is well. We will continue present treatment regimen as discussed. All agreed to suggested treatment plan.     Physical examination  There was tenderness of the splenius capitis and occipitalis region palpation which reproduces mild discomfort with mild tenderness of the cervical and thoracic facet region. There was mild tenderness of the acromioclavicular and glenohumeral joint region. Patient appeared to be with slightly decreased grip strength with Tinel and Phalen's maneuver reproducing minimal discomfort. There was unremarkable Spurling's maneuver. Palpation over the thoracic region was attends to palpation with evidence of muscle spasm with no crepitus of the thoracic region noted. Palpation over the lumbar region reproducing pain of moderate degree with extension and palpation over the lumbar facet region. There was moderate tenderness of the PSIS and PII S region. Palpation greater trochanteric region iliotibial band region was with mild  discomfort. Straight leg raise was tolerates approximately 30 without a definite increase of pain with dorsiflexion noted. The knees were with mild tenderness to palpation. EHL strength appeared to be slightly decreased. There was negative clonus negative Homans. No sensory deficit or dermatomal distribution detected. Abdomen nontender with no costovertebral tenderness noted.      Assessment    DDD lumbar spine  Lumbar stenosis with neurogenic claudication  Lumbar radiculopathy  Lumbar facet syndrome  Scoliosis  T12 compression fracture  L3 compression fracture  DDD thoracic spine  Thoracic facet syndrome  Status post vein stripping of lower extremities  Sacroiliac joint dysfunction  Nephrolithiasis     PLAN  Continue present medications Neurontin and hydrocodone acetaminophen Limit Neurontin 300 mg to 1 by mouth each evening We will change Neurontin to a 100 mg size within refill is due  F/U PCP Dr Cordelia Poche for evaluation of  BP and general medical  condition  F/U surgical evaluation. May consider pending follow-up evaluations  F/U neurological evaluation. May consider pending follow-up evaluations  May consider radiofrequency rhizolysis or intraspinal procedures pending response to present treatment and F/U evaluation We will avoid considering such treatment  Patient to call Pain Management Center should patient have concerns prior to scheduled return appointment.

## 2015-12-09 NOTE — Patient Instructions (Addendum)
  PLAN  Continue present medications Neurontin and hydrocodone acetaminophen. Limit Neurontin 300 mg to 1 by mouth each evening We will change Neurontin to a 100 mg size within refill is due  F/U PCP Dr Cordelia Poche for evaluation of  BP and general medical  condition  F/U surgical evaluation. May consider pending follow-up evaluations  F/U neurological evaluation. May consider pending follow-up evaluations  May consider radiofrequency rhizolysis or intraspinal procedures pending response to present treatment and F/U evaluation We will avoid considering such treatment  Patient to call Pain Management Center should patient have concerns prior to scheduled return appointment.   TAKE NEURONTIN 300MG  ONLY AT BEDTIME.

## 2015-12-09 NOTE — Telephone Encounter (Signed)
RX sent in for extended therapy per provider.

## 2015-12-09 NOTE — Progress Notes (Signed)
Safety precautions to be maintained throughout the outpatient stay will include: orient to surroundings, keep bed in low position, maintain call bell within reach at all times, provide assistance with transfer out of bed and ambulation.  Has been being treated for UTI.  Took 3 days of Cipro.

## 2015-12-16 ENCOUNTER — Encounter: Payer: PPO | Admitting: Pain Medicine

## 2015-12-22 ENCOUNTER — Telehealth: Payer: Self-pay

## 2015-12-22 NOTE — Telephone Encounter (Signed)
Pt called stating she went to see Dr. Valentino Saxon for a pessary cleaning and Dr. Valentino Saxon noted a UTI. Per pt Dr. Valentino Saxon put her on cipro. Pt stated that she does not feel like the UTI is completely gone. Pt was added to nurse schedule for cath urine.

## 2015-12-23 ENCOUNTER — Ambulatory Visit (INDEPENDENT_AMBULATORY_CARE_PROVIDER_SITE_OTHER): Payer: PPO

## 2015-12-23 VITALS — BP 95/56 | HR 72 | Temp 97.4°F | Ht 63.0 in | Wt 141.5 lb

## 2015-12-23 DIAGNOSIS — N390 Urinary tract infection, site not specified: Secondary | ICD-10-CM | POA: Diagnosis not present

## 2015-12-23 LAB — URINALYSIS, COMPLETE
Bilirubin, UA: NEGATIVE
GLUCOSE, UA: NEGATIVE
Ketones, UA: NEGATIVE
Nitrite, UA: POSITIVE — AB
PROTEIN UA: NEGATIVE
RBC, UA: NEGATIVE
Specific Gravity, UA: 1.015 (ref 1.005–1.030)
Urobilinogen, Ur: 0.2 mg/dL (ref 0.2–1.0)
pH, UA: 6 (ref 5.0–7.5)

## 2015-12-23 LAB — MICROSCOPIC EXAMINATION
RBC, UA: >30 /hpf — AB (ref 0–?)
WBC, UA: NONE SEEN /hpf (ref 0–?)

## 2015-12-23 NOTE — Progress Notes (Signed)
In and Out Catheterization  Patient is present today for a I & O catheterization due to possible UTI. Patient was cleaned and prepped in a sterile fashion with betadine and Lidocaine 2% jelly was instilled into the urethra.  A 14FR cath was inserted no complications were noted , of urine return was noted, urine was yellow in color. A clean urine sample was collected for u/a and cx. Bladder was drained  And catheter was removed with out difficulty.    Preformed by: Rupert Stacks, LPN   Follow up/ Additional notes: Pt described UTI s/s to be urinary frequency, urgency, hard to postpone, burning on urination, and leakage of urine. Pt has been on abx in the past 30 days for a UTI.  Blood pressure (!) 95/56, pulse 72, temperature 97.4 F (36.3 C), height 5\' 3"  (1.6 m), weight 141 lb 8 oz (64.2 kg).

## 2015-12-28 LAB — CULTURE, URINE COMPREHENSIVE

## 2015-12-30 ENCOUNTER — Telehealth: Payer: Self-pay

## 2015-12-30 NOTE — Telephone Encounter (Signed)
Spoke with pt in reference to urinary s/s. Pt stated that she is currently feeling ok. Reinforced with pt to continue to drink plenty of fluids. Pt voiced understanding.

## 2015-12-30 NOTE — Telephone Encounter (Signed)
LMOM

## 2015-12-30 NOTE — Telephone Encounter (Signed)
-----   Message from Vanna Scotland, MD sent at 12/30/2015  9:48 AM EDT ----- Urine Cx resistant to cipro.  If, and only if she is still symptomatic, please treat with Augmentin bid x 7 days.  Vanna Scotland, MD

## 2016-01-01 DIAGNOSIS — I4891 Unspecified atrial fibrillation: Secondary | ICD-10-CM | POA: Diagnosis not present

## 2016-01-06 DIAGNOSIS — I482 Chronic atrial fibrillation: Secondary | ICD-10-CM | POA: Diagnosis not present

## 2016-01-08 ENCOUNTER — Ambulatory Visit: Payer: PPO | Admitting: Pain Medicine

## 2016-01-08 DIAGNOSIS — Z0000 Encounter for general adult medical examination without abnormal findings: Secondary | ICD-10-CM | POA: Diagnosis not present

## 2016-01-08 DIAGNOSIS — I10 Essential (primary) hypertension: Secondary | ICD-10-CM | POA: Diagnosis not present

## 2016-01-08 DIAGNOSIS — E039 Hypothyroidism, unspecified: Secondary | ICD-10-CM | POA: Diagnosis not present

## 2016-01-08 DIAGNOSIS — K648 Other hemorrhoids: Secondary | ICD-10-CM | POA: Diagnosis not present

## 2016-01-15 DIAGNOSIS — I10 Essential (primary) hypertension: Secondary | ICD-10-CM | POA: Diagnosis not present

## 2016-01-15 DIAGNOSIS — R7303 Prediabetes: Secondary | ICD-10-CM | POA: Diagnosis not present

## 2016-01-15 DIAGNOSIS — D649 Anemia, unspecified: Secondary | ICD-10-CM | POA: Diagnosis not present

## 2016-01-15 DIAGNOSIS — E039 Hypothyroidism, unspecified: Secondary | ICD-10-CM | POA: Diagnosis not present

## 2016-01-15 DIAGNOSIS — Z23 Encounter for immunization: Secondary | ICD-10-CM | POA: Diagnosis not present

## 2016-01-15 DIAGNOSIS — Z0000 Encounter for general adult medical examination without abnormal findings: Secondary | ICD-10-CM | POA: Diagnosis not present

## 2016-01-21 ENCOUNTER — Telehealth: Payer: Self-pay

## 2016-01-21 NOTE — Telephone Encounter (Signed)
Pt called stating she feels as thought she is getting another UTI. Made pt aware she can come in for a cath specimen. Pt was unhappy with this and wanted to know why she should could not just give a clean catch. Reinforced with pt due to her recurrent UTI hx a cath is need to ensure its a true UTI and not contaminate from vaginal area. Pt once again voiced frustration. Pt then stated that she will just have to call back if she needs [place].

## 2016-01-22 DIAGNOSIS — I4891 Unspecified atrial fibrillation: Secondary | ICD-10-CM | POA: Diagnosis not present

## 2016-01-30 DIAGNOSIS — R062 Wheezing: Secondary | ICD-10-CM | POA: Diagnosis not present

## 2016-01-30 DIAGNOSIS — R399 Unspecified symptoms and signs involving the genitourinary system: Secondary | ICD-10-CM | POA: Diagnosis not present

## 2016-01-30 DIAGNOSIS — R05 Cough: Secondary | ICD-10-CM | POA: Diagnosis not present

## 2016-02-12 ENCOUNTER — Encounter (INDEPENDENT_AMBULATORY_CARE_PROVIDER_SITE_OTHER): Payer: Self-pay | Admitting: Vascular Surgery

## 2016-02-12 ENCOUNTER — Ambulatory Visit (INDEPENDENT_AMBULATORY_CARE_PROVIDER_SITE_OTHER): Payer: PPO | Admitting: Vascular Surgery

## 2016-02-12 VITALS — BP 133/90 | HR 74 | Resp 16 | Wt 140.0 lb

## 2016-02-12 DIAGNOSIS — I872 Venous insufficiency (chronic) (peripheral): Secondary | ICD-10-CM | POA: Diagnosis not present

## 2016-02-12 DIAGNOSIS — Z9889 Other specified postprocedural states: Secondary | ICD-10-CM | POA: Diagnosis not present

## 2016-02-12 DIAGNOSIS — I8311 Varicose veins of right lower extremity with inflammation: Secondary | ICD-10-CM | POA: Diagnosis not present

## 2016-02-12 DIAGNOSIS — I8393 Asymptomatic varicose veins of bilateral lower extremities: Secondary | ICD-10-CM | POA: Insufficient documentation

## 2016-02-12 DIAGNOSIS — I8312 Varicose veins of left lower extremity with inflammation: Secondary | ICD-10-CM | POA: Diagnosis not present

## 2016-02-12 DIAGNOSIS — I70219 Atherosclerosis of native arteries of extremities with intermittent claudication, unspecified extremity: Secondary | ICD-10-CM | POA: Insufficient documentation

## 2016-02-12 DIAGNOSIS — I70213 Atherosclerosis of native arteries of extremities with intermittent claudication, bilateral legs: Secondary | ICD-10-CM

## 2016-02-15 NOTE — Progress Notes (Signed)
[facility] Admission History & Physical  MRN : [id number]  Joanna Roberts is a 82 y.o. (06-12-1928) female who presents with chief complaint of  Chief Complaint  Patient presents with  . Follow-up  .  History of Present Illness:  The patient returns to the office for followup regarding her atherosclerosis of the lower extremities. There has been a small interval change in lower extremity symptoms. Some shortening of the patient's claudication distance.  No development of rest pain symptoms. No new ulcers or wounds have occurred since the last visit.  There have been no significant changes to the patient's overall health care.  The patient denies amaurosis fugax or recent TIA symptoms. There are no recent neurological changes noted. The patient denies history of DVT, PE or superficial thrombophlebitis. The patient denies recent episodes of angina or shortness of breath.    Current Meds  Medication Sig  . carvedilol (COREG) 25 MG tablet TAKE ONE TABLET BY MOUTH TWICE DAILY WITH  MEALS  . conjugated estrogens (PREMARIN) vaginal cream Place 1 Applicatorful vaginally every Monday, Wednesday, and Friday at 8 PM. Small pea sized amount per urethra  . docusate sodium (COLACE) 100 MG capsule Take 1 capsule (100 mg total) by mouth 2 (two) times daily as needed (take to keep stool soft.).  Marland Kitchen estradiol (ESTRACE) 0.1 MG/GM vaginal cream Place vaginally.  . furosemide (LASIX) 20 MG tablet Take 1 tablet (20 mg total) by mouth daily.  Marland Kitchen gabapentin (NEURONTIN) 300 MG capsule Take by mouth.  . gabapentin (NEURONTIN) 300 MG capsule Take 300 mg by mouth at bedtime.   Marland Kitchen HYDROcodone-acetaminophen (NORCO/VICODIN) 5-325 MG tablet Take by mouth.  . levothyroxine (SYNTHROID, LEVOTHROID) 25 MCG tablet Take 25 mcg by mouth daily before breakfast.  . losartan (COZAAR) 25 MG tablet TAKE ONE TABLET EVERY DAY  . nitroGLYCERIN (NITROSTAT) 0.4 MG SL tablet Place under the tongue.  . silver  sulfADIAZINE (SILVADENE) 1 % cream Apply 1 application topically 2 (two) times daily.  . temazepam (RESTORIL) 15 MG capsule Take by mouth.  . traMADol (ULTRAM) 50 MG tablet Take by mouth.  . venlafaxine XR (EFFEXOR-XR) 75 MG 24 hr capsule Take by mouth.  . warfarin (COUMADIN) 3 MG tablet Take by mouth.   Current Facility-Administered Medications for the 02/12/16 encounter (Office Visit) with Renford Dills, MD  Medication  . mirabegron ER (MYRBETRIQ) tablet 25 mg    Past Medical History:  Diagnosis Date  . AICD (automatic cardioverter/defibrillator) present   . Allergy   . Anemia   . Arthritis   . Cataract   . CHF (congestive heart failure) (HCC)   . Depression   . Dysrhythmia   . GERD (gastroesophageal reflux disease)   . Heart murmur   . Hypertension   . Myocardial infarction   . Osteoporosis   . Presence of permanent cardiac pacemaker   . Thyroid disease     Past Surgical History:  Procedure Laterality Date  . ABDOMINAL HYSTERECTOMY    . APPENDECTOMY    . HERNIA REPAIR    . INSERT / REPLACE / REMOVE PACEMAKER    . PERIPHERAL VASCULAR CATHETERIZATION Right 10/08/2014   Procedure: Lower Extremity Angiography;  Surgeon: Renford Dills, MD;  Location: [facility] INVASIVE CV LAB;  Service: Cardiovascular;  Laterality: Right;  . PERIPHERAL VASCULAR CATHETERIZATION  10/08/2014   Procedure: Lower Extremity Intervention;  Surgeon: Renford Dills, MD;  Location: [facility] INVASIVE CV LAB;  Service: Cardiovascular;;    Social History  Social History  Substance Use Topics  . Smoking status: Never Smoker  . Smokeless tobacco: Never Used  . Alcohol use No     Allergies  Allergen Reactions  . Lortab [Hydrocodone-Acetaminophen] Itching, Swelling and Rash  . Macrobid [Nitrofurantoin Monohyd Macro] Diarrhea and Nausea And Vomiting  . Nitrofurantoin Diarrhea, Nausea Only and Nausea And Vomiting     REVIEW OF SYSTEMS (Negative unless checked)  Constitutional: [] Weight loss   [] Fever  [] Chills Cardiac: [] Chest pain   [] Chest pressure   [] Palpitations   [] Shortness of breath when laying flat   [] Shortness of breath with exertion. Vascular:  [] Pain in legs with walking   [] Pain in legs at rest  [] History of DVT   [] Phlebitis   [] Swelling in legs   [] Varicose veins   [] Non-healing ulcers Pulmonary:   [] Uses home oxygen   [] Productive cough   [] Hemoptysis   [] Wheeze  [] COPD   [] Asthma Neurologic:  [] Dizziness   [] Seizures   [] History of stroke   [] History of TIA  [] Aphasia   [] Vissual changes   [] Weakness or numbness in arm   [] Weakness or numbness in leg Musculoskeletal:   [] Joint swelling   [] Joint pain   [] Low back pain Hematologic:  [] Easy bruising  [] Easy bleeding   [] Hypercoagulable state   [] Anemic Gastrointestinal:  [] Diarrhea   [] Vomiting  [] Gastroesophageal reflux/heartburn   [] Difficulty swallowing. Genitourinary:  [] Chronic kidney disease   [] Difficult urination  [] Frequent urination   [] Blood in urine Skin:  [] Rashes   [] Ulcers  Psychological:  [] History of anxiety   []  History of major depression.  Physical Examination  Vitals:   02/12/16 1555  BP: 133/90  Pulse: 74  Resp: 16  Weight: 140 lb (63.5 kg)   Body mass index is 24.8 kg/m. Gen: WD/WN, NAD Head: [place]/AT, No temporalis wasting.  Ear/Nose/Throat: Hearing grossly intact, nares w/o erythema or drainage, poor dentition Eyes: PER, EOMI, sclera nonicteric.  Neck: Supple, no masses.  No bruit or JVD.  Pulmonary:  Good air movement, clear to auscultation bilaterally, no use of accessory muscles.  Cardiac: RRR, normal S1, S2, no Murmurs. Vascular: sluggish cap refill of the feet bilaterally Vessel Right Left  Radial Palpable Palpable  Ulnar Palpable Palpable  Brachial Palpable Palpable  Carotid Palpable Palpable  Femoral Palpable Palpable  Popliteal Not Palpable Not Palpable  PT Not Palpable Not Palpable  DP Not Palpable Not Palpable  Gastrointestinal: soft, non-distended. No guarding/no  peritoneal signs.  Musculoskeletal: M/S 5/5 throughout.  No deformity or atrophy.  Neurologic: CN 2-12 intact. Pain and light touch intact in extremities.  Symmetrical.  Speech is fluent. Motor exam as listed above. Psychiatric: Judgment intact, Mood & affect appropriate for pt's clinical situation. Dermatologic: No rashes or ulcers noted.  No changes consistent with cellulitis. Lymph : No Cervical lymphadenopathy, no lichenification or skin changes of chronic lymphedema.  CBC Lab Results  Component Value Date   WBC 5.5 09/29/2015   HGB 11.4 (L) 09/29/2015   HCT 34.7 (L) 09/29/2015   MCV 97.7 09/29/2015   PLT 149 (L) 09/29/2015    BMET    Component Value Date/Time   NA 135 09/29/2015 1629   NA 140 04/23/2014 1320   K 5.5 (H) 09/29/2015 1629   K 4.8 04/23/2014 1320   CL 102 09/29/2015 1629   CL 109 (H) 04/23/2014 1320   CO2 27 09/29/2015 1629   CO2 26 04/23/2014 1320   GLUCOSE 104 (H) 09/29/2015 1629   GLUCOSE 95 04/23/2014 1320  BUN 22 (H) 09/29/2015 1629   BUN 22 (H) 04/23/2014 1320   CREATININE 1.13 (H) 09/29/2015 1629   CREATININE 1.02 04/23/2014 1320   CALCIUM 9.4 09/29/2015 1629   CALCIUM 8.5 04/23/2014 1320   GFRNONAA 43 (L) 09/29/2015 1629   GFRNONAA 55 (L) 04/23/2014 1320   GFRNONAA 39 (L) 11/02/2013 0540   GFRAA 50 (L) 09/29/2015 1629   GFRAA >60 04/23/2014 1320   GFRAA 45 (L) 11/02/2013 0540   CrCl cannot be calculated (Patient's most recent lab result is older than the maximum 21 days allowed.).  COAG Lab Results  Component Value Date   INR 1.98 03/18/2015   INR 2.00 03/17/2015   INR 2.09 03/16/2015    Radiology No results found.  Assessment/Plan 1. Atherosclerosis of native artery of both lower extremities with intermittent claudication (HCC)  Recommend:  The patient has evidence of atherosclerosis of the lower extremities with claudication.  The patient does not voice lifestyle limiting changes at this point in time.  However, given the  worsening of her symptoms I will see her back in 3 months not six months.  Noninvasive studies do not suggest clinically significant change.  No invasive studies, angiography or surgery at this time The patient should continue walking and begin a more formal exercise program.  The patient should continue antiplatelet therapy and aggressive treatment of the lipid abnormalities  No changes in the patient's medications at this time  - VAS [place] ABI WITH/WO TBI; Future  2. Chronic venous insufficiency Continue compression therapy  3. Varicose veins of both lower extremities with inflammation No surgical intervention at this time Compression as tolerated  4. H/O varicose vein stripping No further surgical intervention at this time     Levora Dredge, MD  02/15/2016 12:10 PM

## 2016-02-17 DIAGNOSIS — I4891 Unspecified atrial fibrillation: Secondary | ICD-10-CM | POA: Diagnosis not present

## 2016-02-18 ENCOUNTER — Telehealth: Payer: Self-pay | Admitting: Obstetrics and Gynecology

## 2016-02-18 DIAGNOSIS — B3731 Acute candidiasis of vulva and vagina: Secondary | ICD-10-CM

## 2016-02-18 DIAGNOSIS — B373 Candidiasis of vulva and vagina: Secondary | ICD-10-CM

## 2016-02-18 MED ORDER — FLUCONAZOLE 150 MG PO TABS: 150.0000 mg | ORAL_TABLET | Freq: Once | ORAL | 3 refills | Status: AC

## 2016-02-18 NOTE — Telephone Encounter (Signed)
LM FOR PT LETTING HER KNOW °

## 2016-02-18 NOTE — Telephone Encounter (Signed)
Can you please let the pt know that I have sent the RX in. Thanks

## 2016-02-18 NOTE — Telephone Encounter (Signed)
Pt called and she has a yeast infection, she went to the walk in because at the time she did not have a PCP and she had a UTI and they gave her antibiotic, she has an appt with [place] the end of November for her pessary check and a new pt est care appt with a PCP on 11/14, so she was wondering if a diflucan can be called in for her to total care today.

## 2016-02-23 DIAGNOSIS — R197 Diarrhea, unspecified: Secondary | ICD-10-CM | POA: Diagnosis not present

## 2016-02-23 DIAGNOSIS — R109 Unspecified abdominal pain: Secondary | ICD-10-CM | POA: Diagnosis not present

## 2016-02-23 DIAGNOSIS — N390 Urinary tract infection, site not specified: Secondary | ICD-10-CM | POA: Diagnosis not present

## 2016-02-23 DIAGNOSIS — I4891 Unspecified atrial fibrillation: Secondary | ICD-10-CM | POA: Diagnosis not present

## 2016-02-24 DIAGNOSIS — N390 Urinary tract infection, site not specified: Secondary | ICD-10-CM | POA: Diagnosis not present

## 2016-02-24 DIAGNOSIS — R197 Diarrhea, unspecified: Secondary | ICD-10-CM | POA: Diagnosis not present

## 2016-02-25 DIAGNOSIS — F4320 Adjustment disorder, unspecified: Secondary | ICD-10-CM | POA: Diagnosis not present

## 2016-02-25 DIAGNOSIS — I4891 Unspecified atrial fibrillation: Secondary | ICD-10-CM | POA: Diagnosis not present

## 2016-02-25 DIAGNOSIS — R197 Diarrhea, unspecified: Secondary | ICD-10-CM | POA: Diagnosis not present

## 2016-02-25 DIAGNOSIS — R102 Pelvic and perineal pain: Secondary | ICD-10-CM | POA: Diagnosis not present

## 2016-02-27 DIAGNOSIS — R140 Abdominal distension (gaseous): Secondary | ICD-10-CM | POA: Diagnosis not present

## 2016-03-01 ENCOUNTER — Other Ambulatory Visit: Payer: Self-pay | Admitting: Nurse Practitioner

## 2016-03-01 DIAGNOSIS — Z86010 Personal history of colonic polyps: Secondary | ICD-10-CM | POA: Diagnosis not present

## 2016-03-01 DIAGNOSIS — R109 Unspecified abdominal pain: Secondary | ICD-10-CM | POA: Diagnosis not present

## 2016-03-01 DIAGNOSIS — K629 Disease of anus and rectum, unspecified: Secondary | ICD-10-CM | POA: Diagnosis not present

## 2016-03-01 DIAGNOSIS — R1084 Generalized abdominal pain: Secondary | ICD-10-CM

## 2016-03-01 DIAGNOSIS — D649 Anemia, unspecified: Secondary | ICD-10-CM | POA: Diagnosis not present

## 2016-03-01 DIAGNOSIS — R197 Diarrhea, unspecified: Secondary | ICD-10-CM | POA: Diagnosis not present

## 2016-03-03 ENCOUNTER — Other Ambulatory Visit
Admission: RE | Admit: 2016-03-03 | Discharge: 2016-03-03 | Disposition: A | Discharge status: Home / Self Care | Payer: PPO | Source: Ambulatory Visit | Attending: Nurse Practitioner | Admitting: Nurse Practitioner

## 2016-03-03 ENCOUNTER — Ambulatory Visit
Admission: RE | Admit: 2016-03-03 | Discharge: 2016-03-03 | Disposition: A | Discharge status: Home / Self Care | Payer: PPO | Source: Ambulatory Visit | Attending: Nurse Practitioner | Admitting: Nurse Practitioner

## 2016-03-03 DIAGNOSIS — R109 Unspecified abdominal pain: Secondary | ICD-10-CM | POA: Diagnosis not present

## 2016-03-03 DIAGNOSIS — M5136 Other intervertebral disc degeneration, lumbar region: Secondary | ICD-10-CM | POA: Insufficient documentation

## 2016-03-03 DIAGNOSIS — M419 Scoliosis, unspecified: Secondary | ICD-10-CM | POA: Diagnosis not present

## 2016-03-03 DIAGNOSIS — R197 Diarrhea, unspecified: Secondary | ICD-10-CM | POA: Insufficient documentation

## 2016-03-03 DIAGNOSIS — R1084 Generalized abdominal pain: Secondary | ICD-10-CM

## 2016-03-03 DIAGNOSIS — I700 Atherosclerosis of aorta: Secondary | ICD-10-CM | POA: Insufficient documentation

## 2016-03-03 DIAGNOSIS — J479 Bronchiectasis, uncomplicated: Secondary | ICD-10-CM | POA: Insufficient documentation

## 2016-03-03 DIAGNOSIS — K8020 Calculus of gallbladder without cholecystitis without obstruction: Secondary | ICD-10-CM | POA: Diagnosis not present

## 2016-03-03 DIAGNOSIS — M4855XD Collapsed vertebra, not elsewhere classified, thoracolumbar region, subsequent encounter for fracture with routine healing: Secondary | ICD-10-CM | POA: Diagnosis not present

## 2016-03-03 LAB — C DIFFICILE QUICK SCREEN W PCR REFLEX
C Diff antigen: NEGATIVE
C Diff interpretation: NOT DETECTED
C Diff toxin: NEGATIVE

## 2016-03-03 MED ORDER — IOPAMIDOL (ISOVUE-300) INJECTION 61%
80.0000 mL | Freq: Once | INTRAVENOUS | Status: AC | PRN
  Administered 2016-03-03: 80 mL via INTRAVENOUS

## 2016-03-08 ENCOUNTER — Encounter: Payer: Self-pay | Admitting: Intensive Care

## 2016-03-08 ENCOUNTER — Emergency Department
Admission: EM | Admit: 2016-03-08 | Discharge: 2016-03-08 | Disposition: A | Discharge status: Home / Self Care | Payer: PPO | Attending: Student in an Organized Health Care Education/Training Program | Admitting: Student in an Organized Health Care Education/Training Program

## 2016-03-08 DIAGNOSIS — I110 Hypertensive heart disease with heart failure: Secondary | ICD-10-CM | POA: Insufficient documentation

## 2016-03-08 DIAGNOSIS — R197 Diarrhea, unspecified: Secondary | ICD-10-CM | POA: Diagnosis not present

## 2016-03-08 DIAGNOSIS — Z79899 Other long term (current) drug therapy: Secondary | ICD-10-CM | POA: Diagnosis not present

## 2016-03-08 DIAGNOSIS — Z95810 Presence of automatic (implantable) cardiac defibrillator: Secondary | ICD-10-CM | POA: Diagnosis not present

## 2016-03-08 DIAGNOSIS — Z7901 Long term (current) use of anticoagulants: Secondary | ICD-10-CM | POA: Diagnosis not present

## 2016-03-08 DIAGNOSIS — I509 Heart failure, unspecified: Secondary | ICD-10-CM | POA: Diagnosis not present

## 2016-03-08 LAB — CBC WITH DIFFERENTIAL/PLATELET
BASOS ABS: 0.1 10*3/uL (ref 0–0.1)
Basophils Relative: 1 %
EOS PCT: 2 %
Eosinophils Absolute: 0.1 10*3/uL (ref 0–0.7)
HEMATOCRIT: 34.5 % — AB (ref 35.0–47.0)
HEMOGLOBIN: 11.4 g/dL — AB (ref 12.0–16.0)
LYMPHS PCT: 16 %
Lymphs Abs: 0.8 10*3/uL — ABNORMAL LOW (ref 1.0–3.6)
MCH: 31.9 pg (ref 26.0–34.0)
MCHC: 33 g/dL (ref 32.0–36.0)
MCV: 96.6 fL (ref 80.0–100.0)
Monocytes Absolute: 0.7 10*3/uL (ref 0.2–0.9)
Monocytes Relative: 14 %
NEUTROS ABS: 3.3 10*3/uL (ref 1.4–6.5)
NEUTROS PCT: 67 %
PLATELETS: 158 10*3/uL (ref 150–440)
RBC: 3.58 MIL/uL — AB (ref 3.80–5.20)
RDW: 15.1 % — ABNORMAL HIGH (ref 11.5–14.5)
WBC: 4.9 10*3/uL (ref 3.6–11.0)

## 2016-03-08 LAB — URINALYSIS COMPLETE WITH MICROSCOPIC (ARMC ONLY)
Bilirubin Urine: NEGATIVE
Glucose, UA: NEGATIVE mg/dL
Hgb urine dipstick: NEGATIVE
Ketones, ur: NEGATIVE mg/dL
Nitrite: NEGATIVE
PROTEIN: 100 mg/dL — AB
Specific Gravity, Urine: 1.015 (ref 1.005–1.030)
pH: 5 (ref 5.0–8.0)

## 2016-03-08 LAB — COMPREHENSIVE METABOLIC PANEL
ALBUMIN: 3.9 g/dL (ref 3.5–5.0)
ALK PHOS: 40 U/L (ref 38–126)
ALT: 17 U/L (ref 14–54)
AST: 22 U/L (ref 15–41)
Anion gap: 5 (ref 5–15)
BILIRUBIN TOTAL: 1.2 mg/dL (ref 0.3–1.2)
BUN: 13 mg/dL (ref 6–20)
CALCIUM: 9 mg/dL (ref 8.9–10.3)
CO2: 19 mmol/L — AB (ref 22–32)
CREATININE: 1.09 mg/dL — AB (ref 0.44–1.00)
Chloride: 112 mmol/L — ABNORMAL HIGH (ref 101–111)
GFR calc non Af Amer: 45 mL/min — ABNORMAL LOW (ref >60)
GFR, EST AFRICAN AMERICAN: 52 mL/min — AB (ref >60)
GLUCOSE: 110 mg/dL — AB (ref 65–99)
Potassium: 3.8 mmol/L (ref 3.5–5.1)
SODIUM: 136 mmol/L (ref 135–145)
TOTAL PROTEIN: 7 g/dL (ref 6.5–8.1)

## 2016-03-08 LAB — LIPASE, BLOOD: Lipase: 24 U/L (ref 11–51)

## 2016-03-08 MED ORDER — SODIUM CHLORIDE 0.9 % IV BOLUS (SEPSIS)
500.0000 mL | Freq: Once | INTRAVENOUS | Status: AC
  Administered 2016-03-08: 500 mL via INTRAVENOUS

## 2016-03-08 NOTE — ED Notes (Signed)
Ambulated to BR.  Tolerated well. Voided without difficulty.  No BM.  AAOx3.  Skin warm and dry. NAD

## 2016-03-08 NOTE — ED Triage Notes (Signed)
Patient presents to ER with diarrhea X2 weeks. Dr Hyacinth Meeker checked stool last week and results were negative for C-diff. Patient reports being on an antibiotic for a UTI and that is when the diarrhea started and has not gone away.

## 2016-03-08 NOTE — ED Notes (Signed)
Pt up to restroom. Unable to obtain stool sample at this time. Pt with unsteady gait but reports this is baseline. Standby assist given.

## 2016-03-08 NOTE — ED Provider Notes (Signed)
[facility] Emergency Department Provider Note    None    (approximate)  I have reviewed the triage vital signs and the nursing notes.   HISTORY  Chief Complaint Diarrhea    HPI LAM BJORKLUND is a 82 y.o. female who presents with complaint of diarrhea for 2 weeks. Patient recent C. difficile test which was negative. States she still having multiple episodes of diarrhea that she thinks all started after she was started on antibiotic for urinary tract infection. She denies any fevers. Denies any nausea or vomiting. Denies any blood in her stools. No melena. Denies any recent fevers. No recent medication changes.   Past Medical History:  Diagnosis Date  . AICD (automatic cardioverter/defibrillator) present   . Allergy   . Anemia   . Arthritis   . Cataract   . CHF (congestive heart failure) (HCC)   . Depression   . Dysrhythmia   . GERD (gastroesophageal reflux disease)   . Heart murmur   . Hypertension   . Myocardial infarction   . Osteoporosis   . Presence of permanent cardiac pacemaker   . Thyroid disease    Family History  Problem Relation Age of Onset  . Early death Mother   . Heart disease Mother   . Arthritis Father   . Asthma Father   . Stroke Sister    Past Surgical History:  Procedure Laterality Date  . ABDOMINAL HYSTERECTOMY    . APPENDECTOMY    . HERNIA REPAIR    . INSERT / REPLACE / REMOVE PACEMAKER    . PERIPHERAL VASCULAR CATHETERIZATION Right 10/08/2014   Procedure: Lower Extremity Angiography;  Surgeon: Renford Dills, MD;  Location: [facility] INVASIVE CV LAB;  Service: Cardiovascular;  Laterality: Right;  . PERIPHERAL VASCULAR CATHETERIZATION  10/08/2014   Procedure: Lower Extremity Intervention;  Surgeon: Renford Dills, MD;  Location: [facility] INVASIVE CV LAB;  Service: Cardiovascular;;   Patient Active Problem List   Diagnosis Date Noted  . Atherosclerosis of native arteries of extremity with intermittent claudication  (HCC) 02/12/2016  . Chronic venous insufficiency 02/12/2016  . Varicose veins of both lower extremities with inflammation 02/12/2016  . DDD (degenerative disc disease), lumbar 11/18/2015  . Facet syndrome, lumbar 11/18/2015  . Sacroiliac joint dysfunction 11/18/2015  . Spinal stenosis, lumbar region, with neurogenic claudication 11/18/2015  . Lumbar radiculopathy 11/18/2015  . Compression fracture of thoracic spine, non-traumatic (HCC) 11/18/2015  . Idiopathic scoliosis 11/18/2015  . H/O varicose vein stripping 11/18/2015  . Acquired hypothyroidism 09/17/2015  . Atrial fibrillation (HCC) 09/17/2015  . Cardiomyopathy (HCC) 09/17/2015  . Back pain, chronic 09/17/2015  . DD (diverticular disease) 09/17/2015  . Aggrieved 09/17/2015  . 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 History of colon polyps 09/17/2015  . Personal history of urinary infection 09/17/2015  . Cannot sleep 09/17/2015  . Esophagitis, reflux 09/17/2015  . Tachycardia-bradycardia (HCC) 09/17/2015  . A-fib (HCC) 03/18/2015  . Pneumonia 03/11/2015  . B12 deficiency 11/27/2014  . Chronic anemia 11/26/2014  . Incomplete bladder emptying 06/24/2014  . Frequent UTI 06/24/2014  . History of urinary anomaly 06/21/2014  . FOM (frequency of micturition) 06/21/2014  . Vascular disorder of lower extremity 04/25/2014  . Chronic abdominal pain 01/17/2014  . CN (constipation) 01/17/2014  . Fatty infiltration of liver 01/17/2014  . Pain in rib 11/06/2013  . Temporary cerebral vascular dysfunction 11/06/2013      Prior to Admission medications   Medication Sig Start Date End Date Taking? Authorizing Provider  carvedilol (COREG) 25 MG tablet TAKE ONE TABLET BY MOUTH TWICE DAILY WITH  MEALS 06/26/15   Historical Provider, MD  conjugated estrogens (PREMARIN) vaginal cream Place 1 Applicatorful vaginally every Monday, Wednesday, and Friday at 8 PM. Small pea sized amount per urethra 09/17/15   Vanna Scotland, MD  diltiazem [facility]) 180 MG  24 hr capsule Take by mouth.    Historical Provider, MD  docusate (COLACE) 50 MG/5ML liquid Take by mouth.    Historical Provider, MD  docusate sodium (COLACE) 100 MG capsule Take 1 capsule (100 mg total) by mouth 2 (two) times daily as needed (take to keep stool soft.). 09/17/15   Vanna Scotland, MD  estradiol (ESTRACE) 0.1 MG/GM vaginal cream Place vaginally. 09/09/14   Historical Provider, MD  furosemide (LASIX) 20 MG tablet Take 1 tablet (20 mg total) by mouth daily. 03/18/15   Milagros Loll, MD  gabapentin (NEURONTIN) 300 MG capsule Take by mouth. 11/19/15 11/18/16  Historical Provider, MD  gabapentin (NEURONTIN) 300 MG capsule Take 300 mg by mouth at bedtime.  11/19/15   Historical Provider, MD  HYDROcodone-acetaminophen (NORCO/VICODIN) 5-325 MG tablet Take by mouth.    Historical Provider, MD  HYDROcodone-acetaminophen (NORCO/VICODIN) 5-325 MG tablet Limit one half to one tab by mouth per day or 2-3 times per day if tolerated Patient not taking: Reported on 02/12/2016 12/09/15   Ewing Schlein, MD  levothyroxine (SYNTHROID, LEVOTHROID) 25 MCG tablet Take 25 mcg by mouth daily before breakfast.    Historical Provider, MD  losartan (COZAAR) 25 MG tablet TAKE ONE TABLET EVERY DAY 07/30/15   Historical Provider, MD  nitroGLYCERIN (NITROSTAT) 0.4 MG SL tablet Place under the tongue.    Historical Provider, MD  oxyCODONE-acetaminophen (PERCOCET/ROXICET) 5-325 MG tablet Take 1 tablet by mouth every 4 (four) hours as needed for severe pain.    Historical Provider, MD  silver sulfADIAZINE (SILVADENE) 1 % cream Apply 1 application topically 2 (two) times daily.    Historical Provider, MD  silver sulfADIAZINE (SILVADENE) 1 % cream Apply topically.    Historical Provider, MD  temazepam (RESTORIL) 15 MG capsule Take by mouth. 02/12/14   Historical Provider, MD  traMADol (ULTRAM) 50 MG tablet Take by mouth. 10/29/15   Historical Provider, MD  venlafaxine XR (EFFEXOR-XR) 75 MG 24 hr capsule Take by mouth. 05/08/15    Historical Provider, MD  warfarin (COUMADIN) 3 MG tablet Take by mouth.    Historical Provider, MD    Allergies Lortab [hydrocodone-acetaminophen]; Macrobid [nitrofurantoin monohyd macro]; and Nitrofurantoin    Social History Social History  Substance Use Topics  . Smoking status: Never Smoker  . Smokeless tobacco: Never Used  . Alcohol use No    Review of Systems Patient denies headaches, rhinorrhea, blurry vision, numbness, shortness of breath, chest pain, edema, cough, abdominal pain, nausea, vomiting, diarrhea, dysuria, fevers, rashes or hallucinations unless otherwise stated above in HPI. ____________________________________________   PHYSICAL EXAM:  VITAL SIGNS: Vitals:   03/08/16 1003  BP: 101/61  Pulse: 72  Resp: 18  Temp: 97.7 F (36.5 C)    Constitutional: Alert and oriented. Well appearing and in no acute distress. Eyes: Conjunctivae are normal. PERRL. EOMI. Head: Atraumatic. Nose: No congestion/rhinnorhea. Mouth/Throat: Mucous membranes are moist.  Oropharynx non-erythematous. Neck: No stridor. Painless ROM. No cervical spine tenderness to palpation Hematological/Lymphatic/Immunilogical: No cervical lymphadenopathy. Cardiovascular: Normal rate, regular rhythm. Grossly normal heart sounds.  Good peripheral circulation. Respiratory: Normal respiratory effort.  No retractions. Lungs CTAB. Gastrointestinal: Soft and nontender. No distention. No abdominal  bruits. No CVA tenderness. Musculoskeletal: No lower extremity tenderness nor edema.  No joint effusions. Neurologic:  Normal speech and language. No gross focal neurologic deficits are appreciated. No gait instability. Skin:  Skin is warm, dry and intact. No rash noted. Psychiatric: Mood and affect are normal. Speech and behavior are normal.  ____________________________________________   LABS (all labs ordered are listed, but only abnormal results are displayed)  Results for orders placed or performed  during the hospital encounter of 03/08/16 (from the past 24 hour(s))  Lipase, blood     Status: None   Collection Time: 03/08/16 10:13 AM  Result Value Ref Range   Lipase 24 11 - 51 U/L  Comprehensive metabolic panel     Status: Abnormal   Collection Time: 03/08/16 10:13 AM  Result Value Ref Range   Sodium 136 135 - 145 mmol/L   Potassium 3.8 3.5 - 5.1 mmol/L   Chloride 112 (H) 101 - 111 mmol/L   CO2 19 (L) 22 - 32 mmol/L   Glucose, Bld 110 (H) 65 - 99 mg/dL   BUN 13 6 - 20 mg/dL   Creatinine, Ser 9.32 (H) 0.44 - 1.00 mg/dL   Calcium 9.0 8.9 - 67.1 mg/dL   Total Protein 7.0 6.5 - 8.1 g/dL   Albumin 3.9 3.5 - 5.0 g/dL   AST 22 15 - 41 U/L   ALT 17 14 - 54 U/L   Alkaline Phosphatase 40 38 - 126 U/L   Total Bilirubin 1.2 0.3 - 1.2 mg/dL   GFR calc non Af Amer 45 (L) >60 mL/min   GFR calc Af Amer 52 (L) >60 mL/min   Anion gap 5 5 - 15  CBC with Differential     Status: Abnormal   Collection Time: 03/08/16 10:13 AM  Result Value Ref Range   WBC 4.9 3.6 - 11.0 K/uL   RBC 3.58 (L) 3.80 - 5.20 MIL/uL   Hemoglobin 11.4 (L) 12.0 - 16.0 g/dL   HCT 24.5 (L) 80.9 - 98.3 %   MCV 96.6 80.0 - 100.0 fL   MCH 31.9 26.0 - 34.0 pg   MCHC 33.0 32.0 - 36.0 g/dL   RDW 38.2 (H) 50.5 - 39.7 %   Platelets 158 150 - 440 K/uL   Neutrophils Relative % 67 %   Neutro Abs 3.3 1.4 - 6.5 K/uL   Lymphocytes Relative 16 %   Lymphs Abs 0.8 (L) 1.0 - 3.6 K/uL   Monocytes Relative 14 %   Monocytes Absolute 0.7 0.2 - 0.9 K/uL   Eosinophils Relative 2 %   Eosinophils Absolute 0.1 0 - 0.7 K/uL   Basophils Relative 1 %   Basophils Absolute 0.1 0 - 0.1 K/uL  Urinalysis complete, with microscopic     Status: Abnormal   Collection Time: 03/08/16 10:16 AM  Result Value Ref Range   Color, Urine YELLOW (A) YELLOW   APPearance CLEAR (A) CLEAR   Glucose, UA NEGATIVE NEGATIVE mg/dL   Bilirubin Urine NEGATIVE NEGATIVE   Ketones, ur NEGATIVE NEGATIVE mg/dL   Specific Gravity, Urine 1.015 1.005 - 1.030   Hgb urine  dipstick NEGATIVE NEGATIVE   pH 5.0 5.0 - 8.0   Protein, ur 100 (A) NEGATIVE mg/dL   Nitrite NEGATIVE NEGATIVE   Leukocytes, UA 3+ (A) NEGATIVE   RBC / HPF 0-5 0 - 5 RBC/hpf   WBC, UA TOO NUMEROUS TO COUNT 0 - 5 WBC/hpf   Bacteria, UA RARE (A) NONE SEEN   Squamous Epithelial / LPF  0-5 (A) NONE SEEN   Mucous PRESENT    Hyaline Casts, UA PRESENT    ____________________________________________  EKG____________________________________________  RADIOLOGY   ____________________________________________   PROCEDURES  Procedure(s) performed: none Procedures    Critical Care performed: no ____________________________________________   INITIAL IMPRESSION / ASSESSMENT AND PLAN / ED COURSE  Pertinent labs & imaging results that were available during my care of the patient were reviewed by me and considered in my medical decision making (see chart for details).  DDX: enteritis, dehydration, colitis, medication side effect  Joanna Roberts is a 82 y.o. who presents to the ED with complaint of persistent diarrhea. Patient is AFVSS in ED. Exam as above. Given current presentation have considered the above differential. Patient is well-appearing. She does appear mildly dehydrated therefore we'll give small IV fluid bolus. Her abdominal exam is soft and benign. Do not feel CT imaging clinically indicated she is otherwise he mechanically stable. Recent C. difficile was negative. Will order stool culture to evaluate for any infectious process.  The patient will be placed on continuous pulse oximetry and telemetry for monitoring.  Laboratory evaluation will be sent to evaluate for the above complaints.     Clinical Course as of Mar 08 2117  Mon Mar 08, 2016  1518 Patient reassessed and states that she feels much better after IV fluids and is requesting discharge home. Since being observed in the ER for 5 hours she's not had any recurrent diarrhea. Repeat abdominal exam is reassuring. Discussed  conservative management and indications for which she should seek medical care.  Have discussed with the patient and available family all diagnostics and treatments performed thus far and all questions were answered to the best of my ability.   Patient was able to tolerate PO and was able to ambulate with a steady gait. The patient demonstrates understanding and agreement with plan.   [PR]    Clinical Course User Index [PR] Willy Eddy, MD     ____________________________________________   FINAL CLINICAL IMPRESSION(S) / ED DIAGNOSES  Final diagnoses:  Diarrhea, unspecified type      NEW MEDICATIONS STARTED DURING THIS VISIT:  New Prescriptions   No medications on file     Note:  This document was prepared using Dragon voice recognition software and may include unintentional dictation errors.\   Willy Eddy, MD 03/08/16 2120

## 2016-03-08 NOTE — ED Notes (Addendum)
Please call Ollen Gross  [phone] for ride home

## 2016-03-09 ENCOUNTER — Ambulatory Visit: Payer: PPO | Admitting: Obstetrics and Gynecology

## 2016-03-09 ENCOUNTER — Emergency Department
Admission: EM | Admit: 2016-03-09 | Discharge: 2016-03-09 | Disposition: A | Discharge status: Home / Self Care | Payer: PPO | Attending: Emergency Medicine | Admitting: Emergency Medicine

## 2016-03-09 DIAGNOSIS — I110 Hypertensive heart disease with heart failure: Secondary | ICD-10-CM | POA: Insufficient documentation

## 2016-03-09 DIAGNOSIS — Z791 Long term (current) use of non-steroidal anti-inflammatories (NSAID): Secondary | ICD-10-CM | POA: Insufficient documentation

## 2016-03-09 DIAGNOSIS — I252 Old myocardial infarction: Secondary | ICD-10-CM | POA: Diagnosis not present

## 2016-03-09 DIAGNOSIS — Z7901 Long term (current) use of anticoagulants: Secondary | ICD-10-CM | POA: Diagnosis not present

## 2016-03-09 DIAGNOSIS — Z79899 Other long term (current) drug therapy: Secondary | ICD-10-CM | POA: Insufficient documentation

## 2016-03-09 DIAGNOSIS — E039 Hypothyroidism, unspecified: Secondary | ICD-10-CM | POA: Diagnosis not present

## 2016-03-09 DIAGNOSIS — R197 Diarrhea, unspecified: Secondary | ICD-10-CM | POA: Insufficient documentation

## 2016-03-09 DIAGNOSIS — I509 Heart failure, unspecified: Secondary | ICD-10-CM | POA: Diagnosis not present

## 2016-03-09 DIAGNOSIS — Z950 Presence of cardiac pacemaker: Secondary | ICD-10-CM | POA: Insufficient documentation

## 2016-03-09 LAB — COMPREHENSIVE METABOLIC PANEL
ALT: 14 U/L (ref 14–54)
AST: 24 U/L (ref 15–41)
Albumin: 3.7 g/dL (ref 3.5–5.0)
Alkaline Phosphatase: 40 U/L (ref 38–126)
Anion gap: 8 (ref 5–15)
BUN: 13 mg/dL (ref 6–20)
CHLORIDE: 112 mmol/L — AB (ref 101–111)
CO2: 16 mmol/L — ABNORMAL LOW (ref 22–32)
Calcium: 8.8 mg/dL — ABNORMAL LOW (ref 8.9–10.3)
Creatinine, Ser: 1.01 mg/dL — ABNORMAL HIGH (ref 0.44–1.00)
GFR calc Af Amer: 57 mL/min — ABNORMAL LOW (ref >60)
GFR calc non Af Amer: 49 mL/min — ABNORMAL LOW (ref >60)
GLUCOSE: 116 mg/dL — AB (ref 65–99)
POTASSIUM: 3.2 mmol/L — AB (ref 3.5–5.1)
Sodium: 136 mmol/L (ref 135–145)
Total Bilirubin: 1.1 mg/dL (ref 0.3–1.2)
Total Protein: 6.5 g/dL (ref 6.5–8.1)

## 2016-03-09 LAB — CBC WITH DIFFERENTIAL/PLATELET
BASOS ABS: 0.1 10*3/uL (ref 0–0.1)
BASOS PCT: 1 %
EOS PCT: 1 %
Eosinophils Absolute: 0.1 10*3/uL (ref 0–0.7)
HEMATOCRIT: 34.5 % — AB (ref 35.0–47.0)
Hemoglobin: 11.3 g/dL — ABNORMAL LOW (ref 12.0–16.0)
Lymphocytes Relative: 12 %
Lymphs Abs: 0.8 10*3/uL — ABNORMAL LOW (ref 1.0–3.6)
MCH: 31.8 pg (ref 26.0–34.0)
MCHC: 32.7 g/dL (ref 32.0–36.0)
MCV: 97.4 fL (ref 80.0–100.0)
MONO ABS: 0.6 10*3/uL (ref 0.2–0.9)
Monocytes Relative: 10 %
NEUTROS ABS: 5 10*3/uL (ref 1.4–6.5)
Neutrophils Relative %: 76 %
PLATELETS: ADEQUATE 10*3/uL (ref 150–440)
RBC: 3.54 MIL/uL — AB (ref 3.80–5.20)
RDW: 15 % — AB (ref 11.5–14.5)
WBC: 6.5 10*3/uL (ref 3.6–11.0)

## 2016-03-09 LAB — BASIC METABOLIC PANEL
Anion gap: 6 (ref 5–15)
BUN: 11 mg/dL (ref 6–20)
CHLORIDE: 119 mmol/L — AB (ref 101–111)
CO2: 14 mmol/L — AB (ref 22–32)
Calcium: 7.9 mg/dL — ABNORMAL LOW (ref 8.9–10.3)
Creatinine, Ser: 0.93 mg/dL (ref 0.44–1.00)
GFR calc Af Amer: >60 mL/min (ref >60)
GFR calc non Af Amer: 54 mL/min — ABNORMAL LOW (ref >60)
GLUCOSE: 125 mg/dL — AB (ref 65–99)
POTASSIUM: 3.2 mmol/L — AB (ref 3.5–5.1)
Sodium: 139 mmol/L (ref 135–145)

## 2016-03-09 LAB — LACTIC ACID, PLASMA: Lactic Acid, Venous: 0.7 mmol/L (ref 0.5–1.9)

## 2016-03-09 LAB — MAGNESIUM: Magnesium: 1.7 mg/dL (ref 1.7–2.4)

## 2016-03-09 LAB — LIPASE, BLOOD: Lipase: 20 U/L (ref 11–51)

## 2016-03-09 MED ORDER — SODIUM CHLORIDE 0.9 % IV BOLUS (SEPSIS)
500.0000 mL | Freq: Once | INTRAVENOUS | Status: AC
  Administered 2016-03-09: 500 mL via INTRAVENOUS

## 2016-03-09 MED ORDER — POTASSIUM CHLORIDE CRYS ER 20 MEQ PO TBCR
40.0000 meq | EXTENDED_RELEASE_TABLET | Freq: Once | ORAL | Status: AC
  Administered 2016-03-09: 40 meq via ORAL
  Filled 2016-03-09: qty 2

## 2016-03-09 NOTE — ED Notes (Signed)
Patient presents to the ED with Diarrhea x 2-3 weeks. Patient reports being seen in the ED yesterday and told the MD she did not want to be admitted.  Patient states last night she had an episode of diarrhea around 4am and patient was incontinent.  Patient denies diarrhea since that time.  Patient states, "I've never not made it to the bathroom before, it really scared me."

## 2016-03-09 NOTE — ED Triage Notes (Signed)
Pt c/o diarrhea with abd cramping for the past 3 weeks, states she was seen here yesterday and given fluids and sent home states she had uncontrollable diarrhea last night worse then before.Joanna Roberts

## 2016-03-09 NOTE — ED Notes (Signed)
Pt had no episodes of diarrhea while in ED, decided she wanted to be DC instead of obs admit overnight b/c of insurance.

## 2016-03-09 NOTE — ED Notes (Signed)
Pt informed about repeat blood test, drawn off IV. Pt states she wants to go home, still denies any urge/feeling of needing to pass stool at this time. Ride has gone to pick someone up and will be back in about 30 minutes.

## 2016-03-09 NOTE — ED Notes (Signed)
Pt assisted to toilet with 1 assist. Pt urinated, no stool at this time. Pt laid back on stretcher, new warm blanket given, knees elevated.

## 2016-03-09 NOTE — ED Notes (Signed)
Pt provided water, graham crackers, and peanut butter. OK per Dr. Fanny Bien.

## 2016-03-09 NOTE — ED Notes (Signed)
Pt states that eating did not help her feel like she needs to use the bathroom for a stool sample. Pt denies any needs at this time, in no distress. Laying on stretcher with blankets, sitting up, knees elevated.

## 2016-03-09 NOTE — Discharge Instructions (Signed)
You were seen in the emergency room for diarrhea. It is important that you follow up closely with your primary care doctor or gastroentrology in the next couple of days.  If you're unable to see your primary care doctor or gastroentrology you may return to the emergency room or go to the [place] walk-in clinic in 1 or 2 days for reexam.  Please return to the emergency room right away if you are to develop a fever, severe nausea, your pain becomes severe or worsens, you are unable to keep food down, begin vomiting any dark or bloody fluid, you develop any dark or bloody stools, feel dehydrated, or other new concerns or symptoms arise.

## 2016-03-09 NOTE — ED Provider Notes (Signed)
[facility] Emergency Department Provider Note   ____________________________________________   First MD Initiated Contact with Patient 03/09/16 [phone]     (approximate)  I have reviewed the triage vital signs and the nursing notes.   HISTORY  Chief Complaint Diarrhea    HPI Joanna Roberts is a 82 y.o. female who reports she's been having loose stools once to twice daily for over 3 weeks. She is seen gastroenterology and had a CT scan done a week ago and was told this was reassuring, in addition she seen in the ER yesterday, and reports that at 4 AM this morning she had another loose watery stool prompting her to return. She denies being in pain, but reports right during the time of stooling she'll have crampy discomfort in the right abdomen. No nausea or vomiting. She continues to eat but is 8 slightly last.  She has not had another bowel movement since 4 AM. No chest pain trouble breathing fevers chills or shortness of breath. Has not seen any bloody stool.   Past Medical History:  Diagnosis Date  . AICD (automatic cardioverter/defibrillator) present   . Allergy   . Anemia   . Arthritis   . Cataract   . CHF (congestive heart failure) (HCC)   . Depression   . Dysrhythmia   . GERD (gastroesophageal reflux disease)   . Heart murmur   . Hypertension   . Myocardial infarction   . Osteoporosis   . Presence of permanent cardiac pacemaker   . Thyroid disease     Patient Active Problem List   Diagnosis Date Noted  . Atherosclerosis of native arteries of extremity with intermittent claudication (HCC) 02/12/2016  . Chronic venous insufficiency 02/12/2016  . Varicose veins of both lower extremities with inflammation 02/12/2016  . DDD (degenerative disc disease), lumbar 11/18/2015  . Facet syndrome, lumbar 11/18/2015  . Sacroiliac joint dysfunction 11/18/2015  . Spinal stenosis, lumbar region, with neurogenic claudication 11/18/2015  . Lumbar  radiculopathy 11/18/2015  . Compression fracture of thoracic spine, non-traumatic (HCC) 11/18/2015  . Idiopathic scoliosis 11/18/2015  . H/O varicose vein stripping 11/18/2015  . Acquired hypothyroidism 09/17/2015  . Atrial fibrillation (HCC) 09/17/2015  . Cardiomyopathy (HCC) 09/17/2015  . Back pain, chronic 09/17/2015  . DD (diverticular disease) 09/17/2015  . Aggrieved 09/17/2015  . 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 History of colon polyps 09/17/2015  . Personal history of urinary infection 09/17/2015  . Cannot sleep 09/17/2015  . Esophagitis, reflux 09/17/2015  . Tachycardia-bradycardia (HCC) 09/17/2015  . A-fib (HCC) 03/18/2015  . Pneumonia 03/11/2015  . B12 deficiency 11/27/2014  . Chronic anemia 11/26/2014  . Incomplete bladder emptying 06/24/2014  . Frequent UTI 06/24/2014  . History of urinary anomaly 06/21/2014  . FOM (frequency of micturition) 06/21/2014  . Vascular disorder of lower extremity 04/25/2014  . Chronic abdominal pain 01/17/2014  . CN (constipation) 01/17/2014  . Fatty infiltration of liver 01/17/2014  . Pain in rib 11/06/2013  . Temporary cerebral vascular dysfunction 11/06/2013    Past Surgical History:  Procedure Laterality Date  . ABDOMINAL HYSTERECTOMY    . APPENDECTOMY    . HERNIA REPAIR    . INSERT / REPLACE / REMOVE PACEMAKER    . PERIPHERAL VASCULAR CATHETERIZATION Right 10/08/2014   Procedure: Lower Extremity Angiography;  Surgeon: Renford Dills, MD;  Location: [facility] INVASIVE CV LAB;  Service: Cardiovascular;  Laterality: Right;  . PERIPHERAL VASCULAR CATHETERIZATION  10/08/2014   Procedure: Lower Extremity Intervention;  Surgeon: Latina Craver  Schnier, MD;  Location: [facility] INVASIVE CV LAB;  Service: Cardiovascular;;    Prior to Admission medications   Medication Sig Start Date End Date Taking? Authorizing Provider  carvedilol (COREG) 25 MG tablet TAKE ONE TABLET BY MOUTH TWICE DAILY WITH  MEALS 06/26/15   Historical Provider, MD  conjugated  estrogens (PREMARIN) vaginal cream Place 1 Applicatorful vaginally every Monday, Wednesday, and Friday at 8 PM. Small pea sized amount per urethra 09/17/15   Vanna Scotland, MD  diltiazem [facility]) 180 MG 24 hr capsule Take by mouth.    Historical Provider, MD  docusate (COLACE) 50 MG/5ML liquid Take by mouth.    Historical Provider, MD  docusate sodium (COLACE) 100 MG capsule Take 1 capsule (100 mg total) by mouth 2 (two) times daily as needed (take to keep stool soft.). 09/17/15   Vanna Scotland, MD  estradiol (ESTRACE) 0.1 MG/GM vaginal cream Place vaginally. 09/09/14   Historical Provider, MD  furosemide (LASIX) 20 MG tablet Take 1 tablet (20 mg total) by mouth daily. 03/18/15   Milagros Loll, MD  gabapentin (NEURONTIN) 300 MG capsule Take by mouth. 11/19/15 11/18/16  Historical Provider, MD  gabapentin (NEURONTIN) 300 MG capsule Take 300 mg by mouth at bedtime.  11/19/15   Historical Provider, MD  HYDROcodone-acetaminophen (NORCO/VICODIN) 5-325 MG tablet Take by mouth.    Historical Provider, MD  HYDROcodone-acetaminophen (NORCO/VICODIN) 5-325 MG tablet Limit one half to one tab by mouth per day or 2-3 times per day if tolerated Patient not taking: Reported on 02/12/2016 12/09/15   Ewing Schlein, MD  levothyroxine (SYNTHROID, LEVOTHROID) 25 MCG tablet Take 25 mcg by mouth daily before breakfast.    Historical Provider, MD  losartan (COZAAR) 25 MG tablet TAKE ONE TABLET EVERY DAY 07/30/15   Historical Provider, MD  nitroGLYCERIN (NITROSTAT) 0.4 MG SL tablet Place under the tongue.    Historical Provider, MD  oxyCODONE-acetaminophen (PERCOCET/ROXICET) 5-325 MG tablet Take 1 tablet by mouth every 4 (four) hours as needed for severe pain.    Historical Provider, MD  silver sulfADIAZINE (SILVADENE) 1 % cream Apply 1 application topically 2 (two) times daily.    Historical Provider, MD  silver sulfADIAZINE (SILVADENE) 1 % cream Apply topically.    Historical Provider, MD  temazepam (RESTORIL) 15 MG capsule  Take by mouth. 02/12/14   Historical Provider, MD  traMADol (ULTRAM) 50 MG tablet Take by mouth. 10/29/15   Historical Provider, MD  venlafaxine XR (EFFEXOR-XR) 75 MG 24 hr capsule Take by mouth. 05/08/15   Historical Provider, MD  warfarin (COUMADIN) 3 MG tablet Take by mouth.    Historical Provider, MD    Allergies Lortab [hydrocodone-acetaminophen]; Macrobid [nitrofurantoin monohyd macro]; and Nitrofurantoin  Family History  Problem Relation Age of Onset  . Early death Mother   . Heart disease Mother   . Arthritis Father   . Asthma Father   . Stroke Sister     Social History Social History  Substance Use Topics  . Smoking status: Never Smoker  . Smokeless tobacco: Never Used  . Alcohol use No    Review of Systems Constitutional: No fever/chills.  Eyes: No visual changes. ENT: No sore throat. Cardiovascular: Denies chest pain. Respiratory: Denies shortness of breath. Gastrointestinal: No abdominal pain.  No nausea, no vomiting.  No constipation. Genitourinary: Negative for dysuria. Musculoskeletal: Negative for back pain. Skin: Negative for rash. Neurological: Negative for headaches, focal weakness or numbness.  10-point ROS otherwise negative.  ____________________________________________   PHYSICAL EXAM:  VITAL SIGNS: ED Triage  Vitals  Enc Vitals Group     BP 03/09/16 0922 117/67     Pulse Rate 03/09/16 0922 71     Resp 03/09/16 0922 16     Temp 03/09/16 0922 98 F (36.7 C)     Temp Source 03/09/16 0922 Oral     SpO2 03/09/16 0922 100 %     Weight 03/09/16 0922 135 lb (61.2 kg)     Height 03/09/16 0922 5\' 3"  (1.6 m)     Head Circumference --      Peak Flow --      Pain Score 03/09/16 0923 5     Pain Loc --      Pain Edu? --      Excl. in GC? --     Constitutional: Alert and oriented. Well appearing and in no acute distress. Eyes: Conjunctivae are normal. PERRL. EOMI. Head: Atraumatic. Nose: No congestion/rhinnorhea. Mouth/Throat: Mucous membranes  are moist.  Oropharynx non-erythematous. Neck: No stridor.   Cardiovascular: Normal rate, regular rhythm. Grossly normal heart sounds.  Good peripheral circulation. Respiratory: Normal respiratory effort.  No retractions. Lungs CTAB. Gastrointestinal: Soft and nontender. No distention. No abdominal bruits.  Musculoskeletal: No lower extremity tenderness nor edema.  No joint effusions. Neurologic:  Normal speech and language. No gross focal neurologic deficits are appreciated. Skin:  Skin is warm, dry and intact. No rash noted. Psychiatric: Mood and affect are normal. Speech and behavior are normal.  ____________________________________________   LABS (all labs ordered are listed, but only abnormal results are displayed)  Labs Reviewed  CBC WITH DIFFERENTIAL/PLATELET - Abnormal; Notable for the following:       Result Value   RBC 3.54 (*)    Hemoglobin 11.3 (*)    HCT 34.5 (*)    RDW 15.0 (*)    Lymphs Abs 0.8 (*)    All other components within normal limits  COMPREHENSIVE METABOLIC PANEL - Abnormal; Notable for the following:    Potassium 3.2 (*)    Chloride 112 (*)    CO2 16 (*)    Glucose, Bld 116 (*)    Creatinine, Ser 1.01 (*)    Calcium 8.8 (*)    GFR calc non Af Amer 49 (*)    GFR calc Af Amer 57 (*)    All other components within normal limits  BLOOD GAS, VENOUS - Abnormal; Notable for the following:    Acid-base deficit 6.5 (*)    All other components within normal limits  BASIC METABOLIC PANEL - Abnormal; Notable for the following:    Potassium 3.2 (*)    Chloride 119 (*)    CO2 14 (*)    Glucose, Bld 125 (*)    Calcium 7.9 (*)    GFR calc non Af Amer 54 (*)    All other components within normal limits  LIPASE, BLOOD  MAGNESIUM  LACTIC ACID, PLASMA   ____________________________________________  EKG   ____________________________________________  RADIOLOGY  No results  found.  ____________________________________________   PROCEDURES  Procedure(s) performed: None  Procedures  Critical Care performed: No  ____________________________________________   INITIAL IMPRESSION / ASSESSMENT AND PLAN / ED COURSE  Pertinent labs & imaging results that were available during my care of the patient were reviewed by me and considered in my medical decision making (see chart for details).  Patient presents for evaluation of loose stools and diarrhea. An ongoing process for about 2-3 weeks now, she's had a negative C. difficile test, recent CT one week ago with  no acute abnormality or acute inflammatory changes noted, no fever, but continues to have one to 2 loose watery stools daily. She reports she felt better after hydration yesterday, and return today. Her abdominal exam is very reassuring and she is in no apparent distress at this time, appears hemodynamically stable, hydrated and no distress. Labs returned with a low bicarbonate possibly due to GI losses and mild hypokalemia without associated weakness or neurologic symptoms.  Richard replace her potassium, check magnesium, lactate and VBG to assure no acidosis associated with her low bicarbonate. Overall suspect chronic diarrhea, discussed with the patient and recommended strongly since she follow-up closely with GI for further etiology and diagnosis. No clear indication for repeat CT imaging at this time.  Clinical Course      ____________________________________________   FINAL CLINICAL IMPRESSION(S) / ED DIAGNOSES  Final diagnoses:  Diarrhea, unspecified type      NEW MEDICATIONS STARTED DURING THIS VISIT:  Discharge Medication List as of 03/09/2016  3:24 PM       Note:  This document was prepared using Dragon voice recognition software and may include unintentional dictation errors.     Sharyn Creamer, MD 03/14/16 [phone]

## 2016-03-09 NOTE — ED Notes (Addendum)
Pt ambulated to toilet with 1 assistance, urinated. Laid back in bed, legs propped up.

## 2016-03-10 LAB — BLOOD GAS, VENOUS
ACID-BASE DEFICIT: 6.5 mmol/L — AB (ref 0.0–2.0)
BICARBONATE: 20.2 mmol/L (ref 20.0–28.0)
PATIENT TEMPERATURE: 37
PCO2 VEN: 44 mmHg (ref 44.0–60.0)
PH VEN: 7.27 (ref 7.250–7.430)

## 2016-03-10 NOTE — ED Provider Notes (Signed)
Called patient today. She is resting at home. Eating well still having occasional loose, non-bloody stools. No fevers or pain. She feels comfortable with plan to see NP Arvilla Market at GI clnic tomorrow. She has appointment setup and has spoken with GI team. Offered to call in potassium prescrption for her, she wishes to obtain from NP [facility] tomorrow and will discuss further treatment and plan at that time.    Sharyn Creamer, MD 03/10/16 [phone]

## 2016-03-11 DIAGNOSIS — R351 Nocturia: Secondary | ICD-10-CM | POA: Diagnosis not present

## 2016-03-11 DIAGNOSIS — D649 Anemia, unspecified: Secondary | ICD-10-CM | POA: Diagnosis not present

## 2016-03-11 DIAGNOSIS — R197 Diarrhea, unspecified: Secondary | ICD-10-CM | POA: Diagnosis not present

## 2016-03-11 DIAGNOSIS — R8290 Unspecified abnormal findings in urine: Secondary | ICD-10-CM | POA: Diagnosis not present

## 2016-03-15 ENCOUNTER — Encounter (INDEPENDENT_AMBULATORY_CARE_PROVIDER_SITE_OTHER): Payer: Self-pay

## 2016-03-15 ENCOUNTER — Other Ambulatory Visit (INDEPENDENT_AMBULATORY_CARE_PROVIDER_SITE_OTHER): Payer: Self-pay | Admitting: Vascular Surgery

## 2016-03-15 ENCOUNTER — Ambulatory Visit (INDEPENDENT_AMBULATORY_CARE_PROVIDER_SITE_OTHER): Payer: PPO | Admitting: Vascular Surgery

## 2016-03-15 ENCOUNTER — Encounter (INDEPENDENT_AMBULATORY_CARE_PROVIDER_SITE_OTHER): Payer: Self-pay | Admitting: Vascular Surgery

## 2016-03-15 VITALS — BP 143/86 | HR 89 | Resp 15 | Ht 61.0 in | Wt 138.0 lb

## 2016-03-15 DIAGNOSIS — I482 Chronic atrial fibrillation, unspecified: Secondary | ICD-10-CM

## 2016-03-15 DIAGNOSIS — I872 Venous insufficiency (chronic) (peripheral): Secondary | ICD-10-CM

## 2016-03-15 DIAGNOSIS — G8929 Other chronic pain: Secondary | ICD-10-CM

## 2016-03-15 DIAGNOSIS — I70213 Atherosclerosis of native arteries of extremities with intermittent claudication, bilateral legs: Secondary | ICD-10-CM | POA: Diagnosis not present

## 2016-03-15 DIAGNOSIS — R109 Unspecified abdominal pain: Secondary | ICD-10-CM

## 2016-03-15 DIAGNOSIS — K551 Chronic vascular disorders of intestine: Secondary | ICD-10-CM | POA: Insufficient documentation

## 2016-03-15 NOTE — Progress Notes (Signed)
MRN : [id number]  Joanna Roberts is a 82 y.o. (1928-10-12) female who presents with chief complaint of  Chief Complaint  Patient presents with  . Re-evaluation    Vascular Colitis  .  History of Present Illness: I am asked to evaluate the patient for the complaint of abdominal pain with uncertain etiology.  The patient is followed by GI who is concerned about mesenteric ischemia.  The patient has noted some weight loss as well as persistent diarrhea.  The patient does substantiate food fear, particular foods do not seem to aggravate or alleviate the symptoms.  The patient denies bloody bowel movements.  The patient has a history of colonoscopy which was not diagnostic.  No history of peptic ulcer disease.   Multiple prior peripheral angiograms and vascular interventions to treat a poorly healing wound of the ankle.  The patient denies amaurosis fugax or recent TIA symptoms. There are no recent neurological changes noted. The patient denies rest pain symptoms. The patient denies history of DVT, PE or superficial thrombophlebitis. The patient denies recent episodes of angina    Current Meds  Medication Sig  . budesonide (ENTOCORT EC) 3 MG 24 hr capsule   . carvedilol (COREG) 25 MG tablet TAKE ONE TABLET BY MOUTH TWICE DAILY WITH  MEALS  . diltiazem (TIAZAC) 180 MG 24 hr capsule Take by mouth.  . docusate (COLACE) 50 MG/5ML liquid Take by mouth.  . docusate sodium (COLACE) 100 MG capsule Take 1 capsule (100 mg total) by mouth 2 (two) times daily as needed (take to keep stool soft.).  Marland Kitchen estradiol (ESTRACE) 0.1 MG/GM vaginal cream Place vaginally.  . furosemide (LASIX) 20 MG tablet Take 1 tablet (20 mg total) by mouth daily.  Marland Kitchen gabapentin (NEURONTIN) 300 MG capsule Take by mouth.  . gabapentin (NEURONTIN) 300 MG capsule Take 300 mg by mouth at bedtime.   Marland Kitchen HYDROcodone-acetaminophen (NORCO/VICODIN) 5-325 MG tablet Take by mouth.  Marland Kitchen HYDROcodone-acetaminophen (NORCO/VICODIN) 5-325 MG tablet  Limit one half to one tab by mouth per day or 2-3 times per day if tolerated  . levothyroxine (SYNTHROID, LEVOTHROID) 25 MCG tablet Take 25 mcg by mouth daily before breakfast.  . losartan (COZAAR) 25 MG tablet TAKE ONE TABLET EVERY DAY  . nitroGLYCERIN (NITROSTAT) 0.4 MG SL tablet Place under the tongue.  Marland Kitchen oxyCODONE-acetaminophen (PERCOCET/ROXICET) 5-325 MG tablet Take 1 tablet by mouth every 4 (four) hours as needed for severe pain.  . silver sulfADIAZINE (SILVADENE) 1 % cream Apply 1 application topically 2 (two) times daily.  . silver sulfADIAZINE (SILVADENE) 1 % cream Apply topically.  . temazepam (RESTORIL) 15 MG capsule Take by mouth.  . traMADol (ULTRAM) 50 MG tablet Take by mouth.  . venlafaxine XR (EFFEXOR-XR) 75 MG 24 hr capsule Take by mouth.  . warfarin (COUMADIN) 3 MG tablet Take by mouth.   Current Facility-Administered Medications for the 03/15/16 encounter (Office Visit) with Renford Dills, MD  Medication  . mirabegron ER (MYRBETRIQ) tablet 25 mg    Past Medical History:  Diagnosis Date  . AICD (automatic cardioverter/defibrillator) present   . Allergy   . Anemia   . Arthritis   . Cataract   . CHF (congestive heart failure) (HCC)   . Depression   . Dysrhythmia   . GERD (gastroesophageal reflux disease)   . Heart murmur   . Hypertension   . Myocardial infarction   . Osteoporosis   . Presence of permanent cardiac pacemaker   . Thyroid disease  Past Surgical History:  Procedure Laterality Date  . ABDOMINAL HYSTERECTOMY    . APPENDECTOMY    . HERNIA REPAIR    . INSERT / REPLACE / REMOVE PACEMAKER    . PERIPHERAL VASCULAR CATHETERIZATION Right 10/08/2014   Procedure: Lower Extremity Angiography;  Surgeon: Renford Dills, MD;  Location: [facility] INVASIVE CV LAB;  Service: Cardiovascular;  Laterality: Right;  . PERIPHERAL VASCULAR CATHETERIZATION  10/08/2014   Procedure: Lower Extremity Intervention;  Surgeon: Renford Dills, MD;  Location: [facility] INVASIVE  CV LAB;  Service: Cardiovascular;;    Social History Social History  Substance Use Topics  . Smoking status: Never Smoker  . Smokeless tobacco: Never Used  . Alcohol use No    Family History Family History  Problem Relation Age of Onset  . Early death Mother   . Heart disease Mother   . Arthritis Father   . Asthma Father   . Stroke Sister   No family history of bleeding/clotting disorders, porphyria or autoimmune disease   Allergies  Allergen Reactions  . Lortab [Hydrocodone-Acetaminophen] Itching, Swelling and Rash  . Doxycycline Diarrhea  . Macrobid [Nitrofurantoin Monohyd Macro] Diarrhea and Nausea And Vomiting  . Nitrofurantoin Diarrhea, Nausea Only and Nausea And Vomiting     REVIEW OF SYSTEMS (Negative unless checked)  Constitutional: [] Weight loss  [] Fever  [] Chills Cardiac: [] Chest pain   [] Chest pressure   [] Palpitations   [] Shortness of breath when laying flat   [] Shortness of breath with exertion. Vascular:  [x] Pain in legs with walking   [] Pain in legs at rest  [] History of DVT   [] Phlebitis   [x] Swelling in legs   [x] Varicose veins   [] Non-healing ulcers Pulmonary:   [] Uses home oxygen   [] Productive cough   [] Hemoptysis   [] Wheeze  [] COPD   [] Asthma Neurologic:  [] Dizziness   [] Seizures   [] History of stroke   [] History of TIA  [] Aphasia   [] Vissual changes   [] Weakness or numbness in arm   [] Weakness or numbness in leg Musculoskeletal:   [] Joint swelling   [] Joint pain   [] Low back pain Hematologic:  [] Easy bruising  [] Easy bleeding   [] Hypercoagulable state   [] Anemic Gastrointestinal:  [x] Diarrhea   [] Vomiting  [x] Gastroesophageal reflux/heartburn   [] Difficulty swallowing. Genitourinary:  [] Chronic kidney disease   [] Difficult urination  [] Frequent urination   [] Blood in urine Skin:  [] Rashes   [] Ulcers  Psychological:  [x] History of anxiety   []  History of major depression.  Physical Examination  Vitals:   03/15/16 1025  BP: (!) 143/86  Pulse: 89    Resp: 15  Weight: 138 lb (62.6 kg)  Height: 5\' 1"  (1.549 m)   Body mass index is 26.07 kg/m. Gen: WD/WN, NAD Head: [place]/AT, No temporalis wasting.  Ear/Nose/Throat: Hearing grossly intact, nares w/o erythema or drainage, poor dentition Eyes: PER, EOMI, sclera nonicteric.  Neck: Supple, no masses.  No bruit or JVD.  Pulmonary:  Good air movement, clear to auscultation bilaterally, no use of accessory muscles.  Cardiac: RRR, normal S1, S2, no Murmurs. Vascular: no abdominal bruit noted, severe venous stasis changes and stasis dermatitis bilaterally, atrophy blanch noted bilaterally Vessel Right Left  Radial Palpable Palpable  Ulnar Palpable Palpable  Brachial Palpable Palpable  Carotid Palpable Palpable  Femoral Palpable Palpable  Popliteal 1+ Palpable 1+ Palpable  PT Not Palpable Not Palpable  DP Not Palpable Not Palpable   Gastrointestinal: soft, non-distended. No guarding/no peritoneal signs.  Musculoskeletal: M/S 5/5 throughout.  No deformity or atrophy.  Neurologic: CN 2-12 intact. Pain and light touch intact in extremities.  Symmetrical.  Speech is fluent. Motor exam as listed above. Psychiatric: Judgment intact, Mood & affect appropriate for pt's clinical situation. Dermatologic: No rashes or ulcers noted.  No changes consistent with cellulitis. Lymph : No Cervical lymphadenopathy, no lichenification or skin changes of chronic lymphedema.  CBC Lab Results  Component Value Date   WBC 6.5 03/09/2016   HGB 11.3 (L) 03/09/2016   HCT 34.5 (L) 03/09/2016   MCV 97.4 03/09/2016   PLT  03/09/2016    PLATELET CLUMPS NOTED ON SMEAR, COUNT APPEARS ADEQUATE    BMET    Component Value Date/Time   NA 139 03/09/2016 1346   NA 140 04/23/2014 1320   K 3.2 (L) 03/09/2016 1346   K 4.8 04/23/2014 1320   CL 119 (H) 03/09/2016 1346   CL 109 (H) 04/23/2014 1320   CO2 14 (L) 03/09/2016 1346   CO2 26 04/23/2014 1320   GLUCOSE 125 (H) 03/09/2016 1346   GLUCOSE 95 04/23/2014 1320    BUN 11 03/09/2016 1346   BUN 22 (H) 04/23/2014 1320   CREATININE 0.93 03/09/2016 1346   CREATININE 1.02 04/23/2014 1320   CALCIUM 7.9 (L) 03/09/2016 1346   CALCIUM 8.5 04/23/2014 1320   GFRNONAA 54 (L) 03/09/2016 1346   GFRNONAA 55 (L) 04/23/2014 1320   GFRNONAA 39 (L) 11/02/2013 0540   GFRAA >60 03/09/2016 1346   GFRAA >60 04/23/2014 1320   GFRAA 45 (L) 11/02/2013 0540   Estimated Creatinine Clearance: 37.5 mL/min (by C-G formula based on SCr of 0.93 mg/dL).  COAG Lab Results  Component Value Date   INR 1.98 03/18/2015   INR 2.00 03/17/2015   INR 2.09 03/16/2015    Radiology Ct Abdomen Pelvis W Contrast  Result Date: 03/03/2016 CLINICAL DATA:  Frequent diarrhea. Right lower quadrant, left lower quadrant and right upper quadrant pain for 3 weeks EXAM: CT ABDOMEN AND PELVIS WITH CONTRAST TECHNIQUE: Multidetector CT imaging of the abdomen and pelvis was performed using the standard protocol following bolus administration of intravenous contrast. CONTRAST:  4mL ISOVUE-300 IOPAMIDOL (ISOVUE-300) INJECTION 61% COMPARISON:  None. FINDINGS: Lower chest: Bronchiectasis and scattered areas of peripheral tree-in-bud nodularity are noted within both lower lobes and right middle lobe and lingula. The heart size appears enlarged. No pericardial or pleural effusion. Hepatobiliary: No focal liver abnormality. Gallstone is identified within the gallbladder fundus. No biliary dilatation. Pancreas: The pancreas appears normal. Spleen: Normal appearance of the spleen. Adrenals/Urinary Tract: Normal appearance of the adrenal glands. Right renal atrophy is identified. Bilateral renal cysts are identified. No mass or hydronephrosis. The urinary bladder is unremarkable. Stomach/Bowel: The stomach is normal. There is no pathologic dilatation of the small or large bowel loops. No abnormal wall thickening or inflammation identified. Colonic diverticula noted without acute inflammation. Vascular/Lymphatic: Aortic  atherosclerosis. No enlarged upper abdominal lymph nodes. No pelvic or inguinal lymph nodes. Reproductive: A pessary device is identified within the vagina. Other: There is no ascites or focal fluid collections within the abdomen or pelvis. Musculoskeletal: Scoliosis deformity is convex towards the left. Marked multi level degenerative disc disease noted. Partial fusion of the L2 and L3 vertebra noted. Compression fracture involving T12 and L4 are again noted. IMPRESSION: 1. No acute findings within the abdomen or pelvis. 2. Aortic atherosclerosis 3. Gallstone 4. Pulmonary changes are identified including bronchiectasis and tree-in-bud nodularity. Findings are suggestive of chronic atypical process such as mycobacterium avium infection. 5. Scoliosis, degenerative disc disease and chronic  vertebral correction fractures. Electronically Signed   By: Signa Kell M.D.   On: 03/03/2016 13:32    Assessment/Plan 1. Chronic mesenteric ischemia (HCC) Recommend:  The patient has evidence of severe atherosclerotic changes of the mesenteric arteries associated with weight loss as well as abdominal pain and N/V.  This represents a high risk for bowel infarction and death.  Patient should undergo angiography of the mesenteric arteries with the hope for intervention to eliminate the ischemic symptoms.    The risks and benefits as well as the alternative therapies was discussed in detail with the patient.  All questions were answered.  Patient agrees to proceed with angiography and intervention.  The patient will follow up with me after the angiogram.  2. Atherosclerosis of native artery of both lower extremities with intermittent claudication (HCC)  Recommend:  The patient has evidence of atherosclerosis of the lower extremities with claudication.  The patient does not voice lifestyle limiting changes at this point in time.  Noninvasive studies do not suggest clinically significant change.  No invasive  studies, angiography or surgery at this time The patient should continue walking and begin a more formal exercise program.  The patient should continue antiplatelet therapy and aggressive treatment of the lipid abnormalities  No changes in the patient's medications at this time  The patient should continue wearing graduated compression socks 10-15 mmHg strength to control the mild edema.    3. Chronic atrial fibrillation (HCC) Will hold Coumadin for two days before the angiogram  4. Chronic venous insufficiency No surgery or intervention at this point in time.    I have reviewed my discussion with the patient regarding venous insufficiency and secondary lymph edema and why it  causes symptoms. I have discussed with the patient the chronic skin changes that accompany these problems and the long term sequela such as ulceration and infection.  Patient will continue wearing graduated compression stockings class 1 (20-30 mmHg) on a daily basis a prescription was given to the patient to keep this updated. The patient will  put the stockings on first thing in the morning and removing them in the evening. The patient is instructed specifically not to sleep in the stockings.  In addition, behavioral modification including elevation during the day will be continued.  Diet and salt restriction was also discussed.  Previous duplex ultrasound of the lower extremities shows normal deep venous system, superficial reflux was not present.   Following the review of the ultrasound the patient will follow up in 12 months to reassess the degree of swelling and the control that graduated compression is offering.   The patient can be assessed for a Lymph Pump at that time.  However, at this time the patient states they are satisfied with the control compression and elevation is yielding.    5. Chronic abdominal pain See #1    Levora Dredge, MD  03/15/2016 11:02 AM

## 2016-03-16 DIAGNOSIS — G603 Idiopathic progressive neuropathy: Secondary | ICD-10-CM | POA: Diagnosis not present

## 2016-03-16 DIAGNOSIS — J40 Bronchitis, not specified as acute or chronic: Secondary | ICD-10-CM | POA: Diagnosis not present

## 2016-03-16 DIAGNOSIS — F5101 Primary insomnia: Secondary | ICD-10-CM | POA: Diagnosis not present

## 2016-03-16 DIAGNOSIS — R197 Diarrhea, unspecified: Secondary | ICD-10-CM | POA: Diagnosis not present

## 2016-03-22 ENCOUNTER — Telehealth (INDEPENDENT_AMBULATORY_CARE_PROVIDER_SITE_OTHER): Payer: Self-pay

## 2016-03-22 MED ORDER — DEXTROSE 5 % IV SOLN
1.5000 g | INTRAVENOUS | Status: AC
  Administered 2016-03-23: 1.5 g via INTRAVENOUS

## 2016-03-22 NOTE — Telephone Encounter (Signed)
Patient is scheduled 03/23/16 for an angio with Dr. Gilda Crease and called today complaining of congestion and wanted to know what to do. I advised her to call her PCP and be evaluated and let us know the results regarding her visit or talk with her PCP.

## 2016-03-23 ENCOUNTER — Encounter: Payer: Self-pay | Admitting: *Deleted

## 2016-03-23 ENCOUNTER — Observation Stay
Admission: RE | Admit: 2016-03-23 | Discharge: 2016-03-24 | Disposition: A | Discharge status: Home / Self Care | Payer: PPO | Source: Ambulatory Visit | Attending: Vascular Surgery | Admitting: Vascular Surgery

## 2016-03-23 ENCOUNTER — Encounter
Admission: RE | Disposition: A | Discharge status: Home / Self Care | Payer: Self-pay | Source: Ambulatory Visit | Attending: Vascular Surgery

## 2016-03-23 DIAGNOSIS — K529 Noninfective gastroenteritis and colitis, unspecified: Secondary | ICD-10-CM | POA: Insufficient documentation

## 2016-03-23 DIAGNOSIS — Z823 Family history of stroke: Secondary | ICD-10-CM | POA: Insufficient documentation

## 2016-03-23 DIAGNOSIS — I252 Old myocardial infarction: Secondary | ICD-10-CM | POA: Insufficient documentation

## 2016-03-23 DIAGNOSIS — I10 Essential (primary) hypertension: Secondary | ICD-10-CM | POA: Diagnosis not present

## 2016-03-23 DIAGNOSIS — Z8249 Family history of ischemic heart disease and other diseases of the circulatory system: Secondary | ICD-10-CM | POA: Insufficient documentation

## 2016-03-23 DIAGNOSIS — I872 Venous insufficiency (chronic) (peripheral): Secondary | ICD-10-CM | POA: Diagnosis not present

## 2016-03-23 DIAGNOSIS — I70219 Atherosclerosis of native arteries of extremities with intermittent claudication, unspecified extremity: Secondary | ICD-10-CM | POA: Insufficient documentation

## 2016-03-23 DIAGNOSIS — Z885 Allergy status to narcotic agent status: Secondary | ICD-10-CM | POA: Diagnosis not present

## 2016-03-23 DIAGNOSIS — Z825 Family history of asthma and other chronic lower respiratory diseases: Secondary | ICD-10-CM | POA: Insufficient documentation

## 2016-03-23 DIAGNOSIS — S8011XA Contusion of right lower leg, initial encounter: Secondary | ICD-10-CM | POA: Diagnosis present

## 2016-03-23 DIAGNOSIS — K559 Vascular disorder of intestine, unspecified: Secondary | ICD-10-CM | POA: Diagnosis not present

## 2016-03-23 DIAGNOSIS — I482 Chronic atrial fibrillation: Secondary | ICD-10-CM | POA: Diagnosis not present

## 2016-03-23 DIAGNOSIS — Z888 Allergy status to other drugs, medicaments and biological substances status: Secondary | ICD-10-CM | POA: Insufficient documentation

## 2016-03-23 DIAGNOSIS — Z8261 Family history of arthritis: Secondary | ICD-10-CM | POA: Diagnosis not present

## 2016-03-23 DIAGNOSIS — K219 Gastro-esophageal reflux disease without esophagitis: Secondary | ICD-10-CM | POA: Diagnosis not present

## 2016-03-23 DIAGNOSIS — Z7902 Long term (current) use of antithrombotics/antiplatelets: Secondary | ICD-10-CM | POA: Diagnosis not present

## 2016-03-23 DIAGNOSIS — E079 Disorder of thyroid, unspecified: Secondary | ICD-10-CM | POA: Insufficient documentation

## 2016-03-23 DIAGNOSIS — Z90710 Acquired absence of both cervix and uterus: Secondary | ICD-10-CM | POA: Diagnosis not present

## 2016-03-23 DIAGNOSIS — Z950 Presence of cardiac pacemaker: Secondary | ICD-10-CM | POA: Insufficient documentation

## 2016-03-23 DIAGNOSIS — K551 Chronic vascular disorders of intestine: Secondary | ICD-10-CM | POA: Diagnosis not present

## 2016-03-23 HISTORY — PX: PERIPHERAL VASCULAR CATHETERIZATION: SHX172C

## 2016-03-23 LAB — CREATININE, SERUM
CREATININE: 0.89 mg/dL (ref 0.44–1.00)
GFR calc Af Amer: >60 mL/min (ref >60)
GFR calc non Af Amer: 57 mL/min — ABNORMAL LOW (ref >60)

## 2016-03-23 LAB — PROTIME-INR
INR: 2.26
Prothrombin Time: 25.3 seconds — ABNORMAL HIGH (ref 11.4–15.2)

## 2016-03-23 LAB — BUN: BUN: 21 mg/dL — AB (ref 6–20)

## 2016-03-23 SURGERY — VISCERAL ANGIOGRAPHY
Anesthesia: Moderate Sedation

## 2016-03-23 MED ORDER — LOSARTAN POTASSIUM 25 MG PO TABS
25.0000 mg | ORAL_TABLET | Freq: Every day | ORAL | Status: DC
  Administered 2016-03-24: 25 mg via ORAL
  Filled 2016-03-23 (×2): qty 1

## 2016-03-23 MED ORDER — DEXTROSE-NACL 5-0.9 % IV SOLN
INTRAVENOUS | Status: DC
  Administered 2016-03-23 – 2016-03-24 (×3): via INTRAVENOUS

## 2016-03-23 MED ORDER — ONDANSETRON HCL 4 MG/2ML IJ SOLN: 4.0000 mg | Freq: Four times a day (QID) | INTRAMUSCULAR | Status: DC | PRN

## 2016-03-23 MED ORDER — CALCIUM CARBONATE ANTACID 500 MG PO CHEW
2.0000 | CHEWABLE_TABLET | ORAL | Status: DC | PRN
  Administered 2016-03-23: 400 mg via ORAL
  Filled 2016-03-23: qty 2

## 2016-03-23 MED ORDER — SILVER SULFADIAZINE 1 % EX CREA
1.0000 "application " | TOPICAL_CREAM | Freq: Two times a day (BID) | CUTANEOUS | Status: DC | PRN
  Filled 2016-03-23: qty 20

## 2016-03-23 MED ORDER — ALUM & MAG HYDROXIDE-SIMETH 200-200-20 MG/5ML PO SUSP: 15.0000 mL | ORAL | Status: DC | PRN

## 2016-03-23 MED ORDER — LOPERAMIDE HCL 2 MG PO CAPS: 2.0000 mg | ORAL_CAPSULE | ORAL | Status: DC | PRN

## 2016-03-23 MED ORDER — TEMAZEPAM 7.5 MG PO CAPS
7.5000 mg | ORAL_CAPSULE | Freq: Every evening | ORAL | Status: DC | PRN
  Administered 2016-03-23: 7.5 mg via ORAL
  Filled 2016-03-23: qty 1

## 2016-03-23 MED ORDER — ACETAMINOPHEN 325 MG PO TABS
650.0000 mg | ORAL_TABLET | Freq: Four times a day (QID) | ORAL | Status: DC | PRN
  Administered 2016-03-24: 650 mg via ORAL
  Filled 2016-03-23: qty 2

## 2016-03-23 MED ORDER — CLOPIDOGREL BISULFATE 75 MG PO TABS
75.0000 mg | ORAL_TABLET | Freq: Every day | ORAL | Status: DC
  Administered 2016-03-23 – 2016-03-24 (×2): 75 mg via ORAL
  Filled 2016-03-23 (×2): qty 1

## 2016-03-23 MED ORDER — OXYCODONE HCL 5 MG PO TABS
5.0000 mg | ORAL_TABLET | ORAL | Status: DC | PRN
  Administered 2016-03-24: 5 mg via ORAL
  Filled 2016-03-23: qty 1

## 2016-03-23 MED ORDER — DOCUSATE SODIUM 100 MG PO CAPS: 100.0000 mg | ORAL_CAPSULE | Freq: Two times a day (BID) | ORAL | Status: DC | PRN

## 2016-03-23 MED ORDER — MORPHINE SULFATE (PF) 4 MG/ML IV SOLN: 2.0000 mg | INTRAVENOUS | Status: DC | PRN

## 2016-03-23 MED ORDER — SORBITOL 70 % SOLN
30.0000 mL | Freq: Every day | Status: DC | PRN
  Filled 2016-03-23 (×2): qty 30

## 2016-03-23 MED ORDER — FENTANYL CITRATE (PF) 100 MCG/2ML IJ SOLN
INTRAMUSCULAR | Status: AC
  Filled 2016-03-23: qty 2

## 2016-03-23 MED ORDER — HEPARIN SODIUM (PORCINE) 1000 UNIT/ML IJ SOLN
INTRAMUSCULAR | Status: AC
  Filled 2016-03-23: qty 1

## 2016-03-23 MED ORDER — HEPARIN (PORCINE) IN NACL 2-0.9 UNIT/ML-% IJ SOLN
INTRAMUSCULAR | Status: AC
  Filled 2016-03-23: qty 1000

## 2016-03-23 MED ORDER — DOCUSATE SODIUM 100 MG PO CAPS: 100.0000 mg | ORAL_CAPSULE | Freq: Every day | ORAL | Status: DC

## 2016-03-23 MED ORDER — DEXTROSE 5 % IV SOLN
INTRAVENOUS | Status: AC
  Filled 2016-03-23: qty 1.5

## 2016-03-23 MED ORDER — HYDROMORPHONE HCL 1 MG/ML IJ SOLN
1.0000 mg | Freq: Once | INTRAMUSCULAR | Status: AC
  Administered 2016-03-23: 1 mg via INTRAVENOUS

## 2016-03-23 MED ORDER — SODIUM CHLORIDE 0.9% FLUSH: 3.0000 mL | Freq: Two times a day (BID) | INTRAVENOUS | Status: DC

## 2016-03-23 MED ORDER — LABETALOL HCL 5 MG/ML IV SOLN: 10.0000 mg | INTRAVENOUS | Status: DC | PRN

## 2016-03-23 MED ORDER — MIDAZOLAM HCL 2 MG/2ML IJ SOLN
INTRAMUSCULAR | Status: DC | PRN
  Administered 2016-03-23 (×4): 1 mg via INTRAVENOUS
  Administered 2016-03-23: 2 mg via INTRAVENOUS

## 2016-03-23 MED ORDER — ASPIRIN EC 81 MG PO TBEC
81.0000 mg | DELAYED_RELEASE_TABLET | Freq: Every day | ORAL | Status: DC
  Administered 2016-03-24: 81 mg via ORAL
  Filled 2016-03-23 (×2): qty 1

## 2016-03-23 MED ORDER — FLEET ENEMA 7-19 GM/118ML RE ENEM
1.0000 | ENEMA | Freq: Once | RECTAL | Status: DC | PRN
Start: 2016-03-23 — End: 2016-03-24

## 2016-03-23 MED ORDER — MIDAZOLAM HCL 2 MG/2ML IJ SOLN
INTRAMUSCULAR | Status: AC
  Filled 2016-03-23: qty 2

## 2016-03-23 MED ORDER — WARFARIN - PHYSICIAN DOSING INPATIENT: Freq: Every day | Status: DC

## 2016-03-23 MED ORDER — ACETAMINOPHEN 650 MG RE SUPP: 650.0000 mg | Freq: Four times a day (QID) | RECTAL | Status: DC | PRN

## 2016-03-23 MED ORDER — PANTOPRAZOLE SODIUM 40 MG PO TBEC: 40.0000 mg | DELAYED_RELEASE_TABLET | Freq: Every day | ORAL | Status: DC

## 2016-03-23 MED ORDER — CARVEDILOL 25 MG PO TABS
25.0000 mg | ORAL_TABLET | Freq: Two times a day (BID) | ORAL | Status: DC
  Administered 2016-03-24 (×2): 25 mg via ORAL
  Filled 2016-03-23 (×2): qty 1

## 2016-03-23 MED ORDER — METHYLPREDNISOLONE SODIUM SUCC 125 MG IJ SOLR: 125.0000 mg | INTRAMUSCULAR | Status: DC | PRN

## 2016-03-23 MED ORDER — HYDRALAZINE HCL 20 MG/ML IJ SOLN: 5.0000 mg | INTRAMUSCULAR | Status: DC | PRN

## 2016-03-23 MED ORDER — MIRABEGRON ER 25 MG PO TB24
25.0000 mg | ORAL_TABLET | Freq: Every day | ORAL | Status: DC
  Administered 2016-03-24: 25 mg via ORAL
  Filled 2016-03-23: qty 1

## 2016-03-23 MED ORDER — WARFARIN SODIUM 2 MG PO TABS
2.0000 mg | ORAL_TABLET | Freq: Every day | ORAL | Status: DC
  Administered 2016-03-24: 2 mg via ORAL
  Filled 2016-03-23 (×4): qty 1

## 2016-03-23 MED ORDER — FUROSEMIDE 20 MG PO TABS: 20.0000 mg | ORAL_TABLET | Freq: Every day | ORAL | Status: DC | PRN

## 2016-03-23 MED ORDER — OXYCODONE HCL 5 MG PO TABS: 5.0000 mg | ORAL_TABLET | ORAL | Status: DC | PRN

## 2016-03-23 MED ORDER — MIDAZOLAM HCL 2 MG/2ML IJ SOLN
INTRAMUSCULAR | Status: AC
  Filled 2016-03-23: qty 4

## 2016-03-23 MED ORDER — FENTANYL CITRATE (PF) 100 MCG/2ML IJ SOLN
INTRAMUSCULAR | Status: DC | PRN
  Administered 2016-03-23 (×4): 50 ug via INTRAVENOUS

## 2016-03-23 MED ORDER — ONDANSETRON HCL 4 MG/2ML IJ SOLN
4.0000 mg | Freq: Four times a day (QID) | INTRAMUSCULAR | Status: DC | PRN
  Filled 2016-03-23: qty 2

## 2016-03-23 MED ORDER — HEPARIN SODIUM (PORCINE) 1000 UNIT/ML IJ SOLN
INTRAMUSCULAR | Status: DC | PRN
  Administered 2016-03-23: 5000 [IU] via INTRAVENOUS

## 2016-03-23 MED ORDER — METOPROLOL TARTRATE 5 MG/5ML IV SOLN: 5.0000 mg | Freq: Four times a day (QID) | INTRAVENOUS | Status: DC

## 2016-03-23 MED ORDER — BUDESONIDE 3 MG PO CPEP
3.0000 mg | ORAL_CAPSULE | Freq: Three times a day (TID) | ORAL | Status: DC
  Administered 2016-03-23 – 2016-03-24 (×3): 3 mg via ORAL
  Filled 2016-03-23 (×4): qty 1

## 2016-03-23 MED ORDER — DOCUSATE SODIUM 100 MG PO CAPS
100.0000 mg | ORAL_CAPSULE | Freq: Two times a day (BID) | ORAL | Status: DC
  Filled 2016-03-23 (×2): qty 1

## 2016-03-23 MED ORDER — ONDANSETRON HCL 4 MG PO TABS: 4.0000 mg | ORAL_TABLET | Freq: Four times a day (QID) | ORAL | Status: DC | PRN

## 2016-03-23 MED ORDER — SODIUM CHLORIDE 0.9 % IV SOLN
INTRAVENOUS | Status: DC
  Administered 2016-03-23: 12:00:00 via INTRAVENOUS

## 2016-03-23 MED ORDER — ACETAMINOPHEN 325 MG PO TABS: 325.0000 mg | ORAL_TABLET | ORAL | Status: DC | PRN

## 2016-03-23 MED ORDER — LIDOCAINE HCL (PF) 1 % IJ SOLN
INTRAMUSCULAR | Status: AC
  Filled 2016-03-23: qty 30

## 2016-03-23 MED ORDER — NITROGLYCERIN 0.4 MG SL SUBL: 0.4000 mg | SUBLINGUAL_TABLET | SUBLINGUAL | Status: DC | PRN

## 2016-03-23 MED ORDER — LEVOTHYROXINE SODIUM 25 MCG PO TABS
25.0000 ug | ORAL_TABLET | Freq: Every day | ORAL | Status: DC
  Administered 2016-03-24: 25 ug via ORAL
  Filled 2016-03-23 (×2): qty 1

## 2016-03-23 MED ORDER — ACETAMINOPHEN 325 MG RE SUPP: 325.0000 mg | RECTAL | Status: DC | PRN

## 2016-03-23 MED ORDER — MAGNESIUM HYDROXIDE 400 MG/5ML PO SUSP: 30.0000 mL | Freq: Every day | ORAL | Status: DC | PRN

## 2016-03-23 MED ORDER — IOPAMIDOL (ISOVUE-300) INJECTION 61%
INTRAVENOUS | Status: DC | PRN
  Administered 2016-03-23: 65 mL via INTRA_ARTERIAL

## 2016-03-23 MED ORDER — HYDROMORPHONE HCL 1 MG/ML IJ SOLN
INTRAMUSCULAR | Status: AC
  Filled 2016-03-23: qty 1

## 2016-03-23 MED ORDER — FAMOTIDINE 20 MG PO TABS: 40.0000 mg | ORAL_TABLET | ORAL | Status: DC | PRN

## 2016-03-23 SURGICAL SUPPLY — 20 items
BALLN ARMADA 6X20X80 (BALLOONS) ×3
BALLN ARMADA 7X20X80 (BALLOONS) ×3
BALLOON ARMADA 6X20X80 (BALLOONS) IMPLANT
BALLOON ARMADA 7X20X80 (BALLOONS) IMPLANT
CATH LIMA 6F (CATHETERS) ×2 IMPLANT
DEVICE PRESTO INFLATION (MISCELLANEOUS) ×2 IMPLANT
DEVICE STARCLOSE SE CLOSURE (Vascular Products) ×2 IMPLANT
DEVICE TORQUE (MISCELLANEOUS) ×2 IMPLANT
GLIDECATH 4FR STR (CATHETERS) ×2 IMPLANT
GLIDECATH F4/38/100ST (CATHETERS) ×2 IMPLANT
GLIDEWIRE STIFF .35X180X3 HYDR (WIRE) ×2 IMPLANT
PACK ANGIOGRAPHY (CUSTOM PROCEDURE TRAY) ×2 IMPLANT
SHEATH BRITE TIP 6FRX11 (SHEATH) ×2 IMPLANT
SHEATH PINNACLE 5F 10CM (SHEATH) ×2 IMPLANT
STENT HERCULINK RX 7.0X15X135 (Permanent Stent) ×2 IMPLANT
TOWEL OR 17X26 4PK STRL BLUE (TOWEL DISPOSABLE) ×2 IMPLANT
VALVE HEMO TOUHY BORST Y (VALVE) ×2 IMPLANT
WIRE G V18X300CM (WIRE) ×2 IMPLANT
WIRE MAGIC TOR.035 180C (WIRE) ×2 IMPLANT
WIRE SPARTACORE .014X300CM (WIRE) ×2 IMPLANT

## 2016-03-23 NOTE — Op Note (Signed)
[facility] Percutaneous Study/Intervention Procedural Note   Date of Surgery: 03/23/2016  Surgeon(s): Hortencia Pilar   Assistants:None  Pre-operative Diagnosis: Mesenteric ischemia Post-operative diagnosis: Same  Procedure(s) Performed: 1. Ultrasound guidance for vascular access right femoral artery 2. Catheter placement into celiac artery from right femoral approach 3. Aortogram and selective celiac angiogram 4. Percutaneous transluminal angioplasty and stent placement celiac artery  5. StarClose closure device right femoral artery              Anesthesia: local with moderate conscious sedation for approximately 70 minutes using intravenous Versed and  Fentanyl  Fluoro Time: 8 minutes  Contrast: 62 cc  Indications: Patient is a 82 year old woman who presents with weight loss postprandial pain and chronic diarrhea.  Colonoscopy results were consistent with ischemic colitis. Angiogram is performed to evaluate the lesion more thoroughly and potentially allow treatment. Risks and benefits were discussed and informed consent was obtained  Procedure: The patient was identified and appropriate procedural time out was performed. The patient was then placed supine on the table and prepped and draped in the usual sterile fashion.Moderate conscious sedation was administered during a face to face encounter with the patient with the RN monitoring their vital signs, mental status, telemetry and pulse oximetry throughout the procedure.  Ultrasound was used to evaluate the right common femoral artery. It was patent . A digital ultrasound image was acquired. A micropuncture needle was used to access the right common femoral artery under direct ultrasound guidance and a microwire followed by micro-sheath was then inserted. A 0.035 J wire was advanced without resistance and a 5Fr sheath was  placed. Pigtail catheter was then placed into the aorta and initially an AP aortogram was performed which showed typical orientations of the celiac and SMA.  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  4000 Units of heparin was then administered and was allowed to circulate for several minutes  The celiac was then selected with a Glidewire and LIMA guiding catheter. Selective imaging of the celiac artery demonstrated 95% stricture at its origin with poststenotic dilatation measuring 9 mm.    The lesion was crossed without difficulty with a Glidewire and then exchanged for a 0.035 Magic torque wire.     I then selected a 6 mm diameter by 20 mm length balloon. The balloon was inflated to 10 atm. And subsequently the 035 wire was exchanged for an 014 [place] core wire and a 7 x 15 Omnilink stent was deployed extending at several submillimeter's into the aorta. Follow-up angiogram demonstrated stent was well opposed celiac was now widely patent however the stent was somewhat undersized wire exchange was again performed and an 035 wire reinserted and subsequently an 8 x 20 ultra versed balloon was advanced across the stent and the stent was postdilated to 8 mm. Follow-up angiogram showed widely patent stent with an excellent result.   I elected to terminate the procedure. The guide catheter was removed and oblique arteriogram was performed of the right femoral artery. StarClose closure device was deployed in the usual fashion with excellent hemostatic result.  Findings:  Aortogram: Aortogram demonstrates diffuse disease but no hemodynamically significant lesions. As noted the anatomy of the visceral segment appears to be fairly typical. 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 Once the lesion was crossed predilated and then a 7 mm stent  placed postdilated to 8 mm there  is excellent result with minimal residual stenosis and rapid filling of the celiac axis.    Disposition: Patient was taken to the recovery room in stable condition having tolerated the procedure well.   Hortencia Pilar 03/23/2016 7:15 PM

## 2016-03-23 NOTE — Progress Notes (Signed)
Called report to [facility]. Spoke with [employer]. RN to take patient was tied up at this time. Short report given to [facility]. Will call again around 1630 to speak with RN for 206.

## 2016-03-23 NOTE — Progress Notes (Signed)
Notified Dr Gilda Crease concerning 5 beat run of Vtach at 2008. Pt went back to Vpaced HR 110 after this event. Reported pt 's pacing spikes hitting inside QRS, undersensing at times. Patient resting at the time. No sx distress. No orders at this time.

## 2016-03-23 NOTE — Progress Notes (Signed)
Patient developed hematoma to right groin. About grapefruit sized. Pressure applied to area. MD made aware. Patient to be admitted for observation overnight. PAD to be deployed.

## 2016-03-23 NOTE — H&P (Addendum)
[facility] History & Physical Update  The patient was interviewed and re-examined.  The patient's previous History and Physical has been reviewed and is unchanged.  There is no change in the plan of care. We plan to proceed with the scheduled procedure.  Levora Dredge, MD  03/23/2016, 11:52 AM

## 2016-03-24 ENCOUNTER — Ambulatory Visit: Payer: PPO | Admitting: Obstetrics and Gynecology

## 2016-03-24 DIAGNOSIS — K551 Chronic vascular disorders of intestine: Secondary | ICD-10-CM | POA: Diagnosis not present

## 2016-03-24 DIAGNOSIS — E559 Vitamin D deficiency, unspecified: Secondary | ICD-10-CM | POA: Diagnosis not present

## 2016-03-24 LAB — PROTIME-INR
INR: 2.01
Prothrombin Time: 23.1 s — ABNORMAL HIGH (ref 11.4–15.2)

## 2016-03-24 LAB — CBC
HCT: 22.6 % — ABNORMAL LOW (ref 35.0–47.0)
Hemoglobin: 7.4 g/dL — ABNORMAL LOW (ref 12.0–16.0)
MCH: 32 pg (ref 26.0–34.0)
MCHC: 32.5 g/dL (ref 32.0–36.0)
MCV: 98.3 fL (ref 80.0–100.0)
Platelets: 131 K/uL — ABNORMAL LOW (ref 150–440)
RBC: 2.3 MIL/uL — ABNORMAL LOW (ref 3.80–5.20)
RDW: 15.8 % — ABNORMAL HIGH (ref 11.5–14.5)
WBC: 8.6 K/uL (ref 3.6–11.0)

## 2016-03-24 LAB — BASIC METABOLIC PANEL WITH GFR
Anion gap: 4 — ABNORMAL LOW (ref 5–15)
BUN: 18 mg/dL (ref 6–20)
CO2: 27 mmol/L (ref 22–32)
Calcium: 8.6 mg/dL — ABNORMAL LOW (ref 8.9–10.3)
Chloride: 108 mmol/L (ref 101–111)
Creatinine, Ser: 0.81 mg/dL (ref 0.44–1.00)
GFR calc Af Amer: >60 mL/min
GFR calc non Af Amer: >60 mL/min
Glucose, Bld: 131 mg/dL — ABNORMAL HIGH (ref 65–99)
Potassium: 4.5 mmol/L (ref 3.5–5.1)
Sodium: 139 mmol/L (ref 135–145)

## 2016-03-24 MED ORDER — ASPIRIN 81 MG PO TBEC: 81.0000 mg | DELAYED_RELEASE_TABLET | Freq: Every day | ORAL | 2 refills | Status: DC

## 2016-03-24 NOTE — Discharge Summary (Signed)
[facility]    Discharge Summary    Patient ID:  Joanna Roberts MRN: [id number] DOB/AGE: Jan 29, 1929 82 y.o.  Admit date: 03/23/2016 Discharge date: 03/24/2016 Date of Surgery: 03/23/2016 Surgeon: Surgeon(s): Renford Dills, MD  Admission Diagnosis: Mesenteric angio    Stricture of artery  Discharge Diagnoses:  Mesenteric angio    Stricture of artery  Secondary Diagnoses: Past Medical History:  Diagnosis Date  . AICD (automatic cardioverter/defibrillator) present   . Allergy   . Anemia   . Arthritis   . Cataract   . CHF (congestive heart failure) (HCC)   . Depression   . Dysrhythmia   . GERD (gastroesophageal reflux disease)   . Heart murmur   . Hypertension   . Myocardial infarction   . Osteoporosis   . Presence of permanent cardiac pacemaker   . Thyroid disease     Procedure(s): Visceral Angiography  Discharged Condition: good  HPI:  The patient presented to the office on her last visit noting increased abdominal pain as well as postprandial pain associated with weight loss. 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 Based on these findings chronic mesenteric ischemia was the diagnosis. Recommendation was for angiography with intervention.  On the day of admission the patient underwent a successful angiogram with placement of a stent in the celiac artery. The SMA was noted to be completely occluded. And the IMA was also noted to be nonvisualized consistent with occlusion. The stricture of the celiac prior to intervention was greater than 95%. A Lifestream stent was placed and postdilated to 9 mm in diameter. This achieved an excellent result. Postoperatively she was noted to develop hematoma in spite of what appeared to be a successful Star close initially. This was managed with pressure. Given that she lives alone she was kept overnight for further  observation. It should be noted she was not taken off her Coumadin and aspirin Plavix was added following the intervention. She never exhibited any episodes of hemodynamic instability. She maintained a blood pressure with a systolic in excess of 120 mmHg. Today she is feeling fine is complaining of some soreness but otherwise has done well and is fit for discharge.  Hospital Course:  BARI LEIB is a 82 y.o. female is S/P Neither Procedure(s): Visceral Angiography Extubated: POD # 0 Physical exam: Right groin clean dry and intact small hematoma with mild tenderness abdomen soft and nontender Post-op wounds clean, dry, intact or healing well Pt. Ambulating, voiding and taking PO diet without difficulty. Pt pain controlled with PO pain meds. Labs as below Complications: Hematoma right groin  Consults:    Significant Diagnostic Studies: CBC Lab Results  Component Value Date   WBC 8.6 03/24/2016   HGB 7.4 (L) 03/24/2016   HCT 22.6 (L) 03/24/2016   MCV 98.3 03/24/2016   PLT 131 (L) 03/24/2016    BMET    Component Value Date/Time   NA 139 03/24/2016 0535   NA 140 04/23/2014 1320   K 4.5 03/24/2016 0535   K 4.8 04/23/2014 1320   CL 108 03/24/2016 0535   CL 109 (H) 04/23/2014 1320   CO2 27 03/24/2016 0535   CO2 26 04/23/2014 1320   GLUCOSE 131 (H) 03/24/2016 0535   GLUCOSE 95 04/23/2014 1320   BUN 18 03/24/2016 0535   BUN 22 (H) 04/23/2014 1320   CREATININE 0.81 03/24/2016 0535   CREATININE 1.02 04/23/2014 1320  CALCIUM 8.6 (L) 03/24/2016 0535   CALCIUM 8.5 04/23/2014 1320   GFRNONAA >60 03/24/2016 0535   GFRNONAA 55 (L) 04/23/2014 1320   GFRNONAA 39 (L) 11/02/2013 0540   GFRAA >60 03/24/2016 0535   GFRAA >60 04/23/2014 1320   GFRAA 45 (L) 11/02/2013 0540   COAG Lab Results  Component Value Date   INR 2.01 03/24/2016   INR 2.26 03/23/2016   INR 1.98 03/18/2015     Disposition:  Discharge to :Home Discharge Instructions    Call MD for:  redness, tenderness,  or signs of infection (pain, swelling, bleeding, redness, odor or green/yellow discharge around incision site)    Complete by:  As directed    Call MD for:  severe or increased pain, loss or decreased feeling  in affected limb(s)    Complete by:  As directed    Call MD for:  temperature >100.5    Complete by:  As directed    Driving Restrictions    Complete by:  As directed    OK to drive tomorrow   Lifting restrictions    Complete by:  As directed    No lifting for 3-4 days > 15 pounds   Resume previous diet    Complete by:  As directed        Medication List    TAKE these medications   ANTI-DIARRHEAL 2 MG capsule Generic drug:  loperamide Take 2 mg by mouth as needed for diarrhea or loose stools.   aspirin 81 MG EC tablet Take 1 tablet (81 mg total) by mouth daily. Start taking on:  03/25/2016   budesonide 3 MG 24 hr capsule Commonly known as:  ENTOCORT EC Take 3 mg by mouth 3 (three) times daily.   carvedilol 25 MG tablet Commonly known as:  COREG TAKE ONE TABLET BY MOUTH TWICE DAILY WITH  MEALS   docusate sodium 100 MG capsule Commonly known as:  COLACE Take 1 capsule (100 mg total) by mouth 2 (two) times daily as needed (take to keep stool soft.).   furosemide 20 MG tablet Commonly known as:  LASIX Take 1 tablet (20 mg total) by mouth daily. What changed:  when to take this  reasons to take this   HYDROcodone-acetaminophen 5-325 MG tablet Commonly known as:  NORCO/VICODIN Limit one half to one tab by mouth per day or 2-3 times per day if tolerated   levothyroxine 25 MCG tablet Commonly known as:  SYNTHROID, LEVOTHROID Take 25 mcg by mouth daily before breakfast.   losartan 25 MG tablet Commonly known as:  COZAAR TAKE ONE TABLET EVERY DAY   nitroGLYCERIN 0.4 MG SL tablet Commonly known as:  NITROSTAT Place 0.4 mg under the tongue every 5 (five) minutes as needed for chest pain.   silver sulfADIAZINE 1 % cream Commonly known as:  SILVADENE Apply 1  application topically 2 (two) times daily as needed.   warfarin 2 MG tablet Commonly known as:  COUMADIN Take 2 mg by mouth daily.      Verbal and written Discharge instructions given to the patient. Wound care per Discharge AVS Follow-up Information    Levora Dredge, MD Follow up in 2 week(s).   Specialties:  Vascular Surgery, Cardiology, Radiology, Vascular Surgery Why:  Your appointment is April 06, 2016 at [facility] information: [address] [place] [address] [phone]           Signed: Levora Dredge, MD  03/24/2016, 7:15 PM

## 2016-03-24 NOTE — Plan of Care (Signed)
Problem: Safety: Goal: Ability to remain free from injury will improve Outcome: Progressing Educated patient on importance of calling for assistance before trying to get out of bed

## 2016-03-24 NOTE — Care Management Obs Status (Signed)
MEDICARE OBSERVATION STATUS NOTIFICATION   Patient Details  Name: Joanna Roberts MRN: [id number] Date of Birth: 11-May-1928   Medicare Observation Status Notification Given:  Yes SIGNED copy sent to HIM    Chapman Fitch, RN 03/24/2016, 2:47 PM

## 2016-03-24 NOTE — Progress Notes (Signed)
Pt alert and oriented VSS, Patient tolerating diet. IV removed intact. Discharge instructions given with no further questions. Pt discharged via w/c accompanied by staff and friends of patient with all of her belongings.Dressing intact with no bleeding.

## 2016-03-24 NOTE — Plan of Care (Signed)
Problem: Bowel/Gastric: Goal: Will not experience complications related to bowel motility Outcome: Progressing Patient educated on the need to follow prescribed diet to enhance bowel motility

## 2016-03-25 ENCOUNTER — Encounter: Payer: Self-pay | Admitting: Vascular Surgery

## 2016-03-29 ENCOUNTER — Other Ambulatory Visit (INDEPENDENT_AMBULATORY_CARE_PROVIDER_SITE_OTHER): Payer: Self-pay

## 2016-03-29 NOTE — Telephone Encounter (Signed)
Patient called stating that she is nauseated and having pain in her sides. Want to know if she can be prescribed something for nausea? Dr. Gilda Crease gave the ok for Zofran 4 mg, 1 po Q 8 hours #10, ordered by Lowanda Foster through [facility], I called it in. Patient is to be scheduled for a CT angio ABD Aorta for this week.

## 2016-03-30 ENCOUNTER — Other Ambulatory Visit (INDEPENDENT_AMBULATORY_CARE_PROVIDER_SITE_OTHER): Payer: Self-pay | Admitting: Vascular Surgery

## 2016-03-30 DIAGNOSIS — M545 Low back pain: Secondary | ICD-10-CM | POA: Diagnosis not present

## 2016-03-30 DIAGNOSIS — I4891 Unspecified atrial fibrillation: Secondary | ICD-10-CM | POA: Diagnosis not present

## 2016-03-30 DIAGNOSIS — I739 Peripheral vascular disease, unspecified: Secondary | ICD-10-CM | POA: Diagnosis not present

## 2016-03-30 DIAGNOSIS — Z0000 Encounter for general adult medical examination without abnormal findings: Secondary | ICD-10-CM | POA: Diagnosis not present

## 2016-03-30 DIAGNOSIS — D649 Anemia, unspecified: Secondary | ICD-10-CM | POA: Diagnosis not present

## 2016-03-30 DIAGNOSIS — R7303 Prediabetes: Secondary | ICD-10-CM | POA: Diagnosis not present

## 2016-03-30 DIAGNOSIS — K551 Chronic vascular disorders of intestine: Secondary | ICD-10-CM | POA: Diagnosis not present

## 2016-03-30 DIAGNOSIS — I10 Essential (primary) hypertension: Secondary | ICD-10-CM | POA: Diagnosis not present

## 2016-03-30 DIAGNOSIS — F4320 Adjustment disorder, unspecified: Secondary | ICD-10-CM | POA: Diagnosis not present

## 2016-03-30 DIAGNOSIS — E039 Hypothyroidism, unspecified: Secondary | ICD-10-CM | POA: Diagnosis not present

## 2016-03-30 DIAGNOSIS — R110 Nausea: Secondary | ICD-10-CM | POA: Diagnosis not present

## 2016-03-30 DIAGNOSIS — K55059 Acute (reversible) ischemia of intestine, part and extent unspecified: Secondary | ICD-10-CM | POA: Diagnosis not present

## 2016-03-30 DIAGNOSIS — I482 Chronic atrial fibrillation: Secondary | ICD-10-CM | POA: Diagnosis not present

## 2016-03-30 DIAGNOSIS — N390 Urinary tract infection, site not specified: Secondary | ICD-10-CM | POA: Diagnosis not present

## 2016-03-30 DIAGNOSIS — R1030 Lower abdominal pain, unspecified: Secondary | ICD-10-CM

## 2016-03-30 DIAGNOSIS — R197 Diarrhea, unspecified: Secondary | ICD-10-CM | POA: Diagnosis not present

## 2016-03-31 ENCOUNTER — Ambulatory Visit
Admission: RE | Admit: 2016-03-31 | Discharge: 2016-03-31 | Disposition: A | Discharge status: Home / Self Care | Payer: PPO | Source: Ambulatory Visit | Attending: Vascular Surgery | Admitting: Vascular Surgery

## 2016-03-31 ENCOUNTER — Other Ambulatory Visit (INDEPENDENT_AMBULATORY_CARE_PROVIDER_SITE_OTHER): Payer: Self-pay | Admitting: Vascular Surgery

## 2016-03-31 ENCOUNTER — Telehealth (INDEPENDENT_AMBULATORY_CARE_PROVIDER_SITE_OTHER): Payer: Self-pay

## 2016-03-31 DIAGNOSIS — R1030 Lower abdominal pain, unspecified: Secondary | ICD-10-CM | POA: Diagnosis not present

## 2016-03-31 DIAGNOSIS — I728 Aneurysm of other specified arteries: Secondary | ICD-10-CM | POA: Diagnosis not present

## 2016-03-31 DIAGNOSIS — K5730 Diverticulosis of large intestine without perforation or abscess without bleeding: Secondary | ICD-10-CM | POA: Insufficient documentation

## 2016-03-31 DIAGNOSIS — I700 Atherosclerosis of aorta: Secondary | ICD-10-CM | POA: Diagnosis not present

## 2016-03-31 DIAGNOSIS — R1084 Generalized abdominal pain: Secondary | ICD-10-CM

## 2016-03-31 MED ORDER — IOPAMIDOL (ISOVUE-370) INJECTION 76%
100.0000 mL | Freq: Once | INTRAVENOUS | Status: AC | PRN
  Administered 2016-03-31: 100 mL via INTRAVENOUS

## 2016-03-31 NOTE — Telephone Encounter (Signed)
Patient called having bilateral feet swelling and a hard blue spot on right leg,Patient was concern about having a DVT because of past history.I spoke with Selena Batten and advise for her to come in tomorrow for an dvt ultrasound study put she did not accept because she stated that she did not have transportation past 12.Since patient could not come Selena Batten strongly advise her to go to the ER to be check out.Ms Pho was also advise to elevate feet above heart level to help with the swelling. Also she asked when her next appointment was and said she would  be here for that appointment

## 2016-04-01 ENCOUNTER — Encounter (INDEPENDENT_AMBULATORY_CARE_PROVIDER_SITE_OTHER): Payer: Self-pay | Admitting: Vascular Surgery

## 2016-04-01 ENCOUNTER — Ambulatory Visit (INDEPENDENT_AMBULATORY_CARE_PROVIDER_SITE_OTHER): Payer: PPO | Admitting: Vascular Surgery

## 2016-04-01 VITALS — BP 120/66 | HR 74 | Resp 16 | Ht 61.0 in | Wt 142.0 lb

## 2016-04-01 DIAGNOSIS — I482 Chronic atrial fibrillation, unspecified: Secondary | ICD-10-CM

## 2016-04-01 DIAGNOSIS — K551 Chronic vascular disorders of intestine: Secondary | ICD-10-CM | POA: Diagnosis not present

## 2016-04-01 DIAGNOSIS — D62 Acute posthemorrhagic anemia: Secondary | ICD-10-CM | POA: Insufficient documentation

## 2016-04-01 DIAGNOSIS — I872 Venous insufficiency (chronic) (peripheral): Secondary | ICD-10-CM

## 2016-04-01 DIAGNOSIS — I70213 Atherosclerosis of native arteries of extremities with intermittent claudication, bilateral legs: Secondary | ICD-10-CM

## 2016-04-01 NOTE — Progress Notes (Signed)
MRN : [id number]  Joanna Roberts is a 82 y.o. (27-Jan-1929) female who presents with chief complaint of  Chief Complaint  Patient presents with  . Re-evaluation    CT results  .  History of Present Illness: The patient is seen for follow up evaluation of abdominal pain and mesenteric ischemia status post CTA. There were no problems or complications related to the CT scan. The patient denies changes in her abdominal or back pain. No new lower extremity pain or discoloration of the toes.   The patient has a history of coronary artery disease, no recent episodes of angina or shortness of breath. The patient denies interval anaurosis fugax. There is o recent history of TIA symptoms or focal motor deficits. The patient denies PAD or claudication symptoms. There is a history of hyperlipidemia which is being treated with a statin.   CT angiography of the abdomen and pelvis shows the celiac stent is widely patent there is question of a bleed although my personal review does not appreciated this.  No outpatient prescriptions have been marked as taking for the 04/01/16 encounter (Office Visit) with Renford Dills, MD.   Current Facility-Administered Medications for the 04/01/16 encounter (Office Visit) with Renford Dills, MD  Medication  . mirabegron ER (MYRBETRIQ) tablet 25 mg    Past Medical History:  Diagnosis Date  . AICD (automatic cardioverter/defibrillator) present   . Allergy   . Anemia   . Arthritis   . Cataract   . CHF (congestive heart failure) (HCC)   . Depression   . Dysrhythmia   . GERD (gastroesophageal reflux disease)   . Heart murmur   . Hypertension   . Myocardial infarction   . Osteoporosis   . Presence of permanent cardiac pacemaker   . Thyroid disease     Past Surgical History:  Procedure Laterality Date  . ABDOMINAL HYSTERECTOMY    . APPENDECTOMY    . HERNIA REPAIR    . INSERT / REPLACE / REMOVE PACEMAKER    . PERIPHERAL VASCULAR CATHETERIZATION Right  10/08/2014   Procedure: Lower Extremity Angiography;  Surgeon: Renford Dills, MD;  Location: [facility];  Service: Cardiovascular;  Laterality: Right;  . PERIPHERAL VASCULAR CATHETERIZATION  10/08/2014   Procedure: Lower Extremity Intervention;  Surgeon: Renford Dills, MD;  Location: [facility];  Service: Cardiovascular;;  . PERIPHERAL VASCULAR CATHETERIZATION N/A 03/23/2016   Procedure: Visceral Angiography;  Surgeon: Renford Dills, MD;  Location: [facility];  Service: Cardiovascular;  Laterality: N/A;    Social History Social History  Substance Use Topics  . Smoking status: Never Smoker  . Smokeless tobacco: Never Used  . Alcohol use No    Family History Family History  Problem Relation Age of Onset  . Early death Mother   . Heart disease Mother   . Arthritis Father   . Asthma Father   . Stroke Sister   No family history of bleeding/clotting disorders, porphyria or autoimmune disease   Allergies  Allergen Reactions  . Lortab [Hydrocodone-Acetaminophen] Itching, Swelling and Rash  . Doxycycline Diarrhea  . Macrobid [Nitrofurantoin Monohyd Macro] Diarrhea and Nausea And Vomiting  . Nitrofurantoin Diarrhea, Nausea Only and Nausea And Vomiting     REVIEW OF SYSTEMS (Negative unless checked)  Constitutional: [] Weight loss  [] Fever  [] Chills Cardiac: [] Chest pain   [] Chest pressure   [] Palpitations   [] Shortness of breath when laying flat   [] Shortness of breath with exertion. Vascular:  []   Pain in legs with walking   [] Pain in legs at rest  [] History of DVT   [] Phlebitis   [x] Swelling in legs   [] Varicose veins   [] Non-healing ulcers Pulmonary:   [] Uses home oxygen   [] Productive cough   [] Hemoptysis   [] Wheeze  [] COPD   [] Asthma Neurologic:  [] Dizziness   [] Seizures   [] History of stroke   [] History of TIA  [] Aphasia   [] Vissual changes   [] Weakness or numbness in arm   [] Weakness or numbness in leg Musculoskeletal:   [] Joint swelling    [] Joint pain   [] Low back pain Hematologic:  [x] Easy bruising  [] Easy bleeding   [] Hypercoagulable state   [] Anemic Gastrointestinal:  [] Diarrhea   [] Vomiting  [] Gastroesophageal reflux/heartburn   [] Difficulty swallowing. Genitourinary:  [] Chronic kidney disease   [] Difficult urination  [] Frequent urination   [] Blood in urine Skin:  [] Rashes   [] Ulcers  Psychological:  [x] History of anxiety   [x]  History of depression.  Physical Examination  Vitals:   04/01/16 1531  BP: 120/66  Pulse: 74  Resp: 16  Weight: 142 lb (64.4 kg)  Height: 5\' 1"  (1.549 m)   Body mass index is 26.83 kg/m. Gen: WD/WN, NAD Head: [place]/AT, No temporalis wasting.  Ear/Nose/Throat: Hearing grossly intact, nares w/o erythema or drainage, poor dentition Eyes: PER, EOMI, sclera nonicteric.  Neck: Supple, no masses.  No bruit or JVD.  Pulmonary:  Good air movement, clear to auscultation bilaterally, no use of accessory muscles.  Cardiac: RRR, normal S1, S2, no Murmurs. Vascular: hematoma right femoral/groin with ecchymosis venous stasis dermatitis bilateral ankle areas with atrophy blanch Vessel Right Left  Radial Palpable Palpable  Ulnar Palpable Palpable  Brachial Palpable Palpable  Carotid Palpable Palpable  Femoral Palpable Palpable  Popliteal Not Palpable Not Palpable  PT Not Palpable Not Palpable  DP Not Palpable Not Palpable   Gastrointestinal: soft, non-distended. No guarding/no peritoneal signs.  Musculoskeletal: M/S 5/5 throughout.  No deformity or atrophy.  Neurologic: CN 2-12 intact. Pain and light touch intact in extremities.  Symmetrical.  Speech is fluent. Motor exam as listed above. Psychiatric: Judgment intact, Mood & affect appropriate for pt's clinical situation. Dermatologic: No active ulcers noted.  No changes consistent with cellulitis. Lymph : No Cervical lymphadenopathy, no lichenification or skin changes of chronic lymphedema.  CBC Lab Results  Component Value Date   WBC 8.6  03/24/2016   HGB 7.4 (L) 03/24/2016   HCT 22.6 (L) 03/24/2016   MCV 98.3 03/24/2016   PLT 131 (L) 03/24/2016    BMET    Component Value Date/Time   NA 139 03/24/2016 0535   NA 140 04/23/2014 1320   K 4.5 03/24/2016 0535   K 4.8 04/23/2014 1320   CL 108 03/24/2016 0535   CL 109 (H) 04/23/2014 1320   CO2 27 03/24/2016 0535   CO2 26 04/23/2014 1320   GLUCOSE 131 (H) 03/24/2016 0535   GLUCOSE 95 04/23/2014 1320   BUN 18 03/24/2016 0535   BUN 22 (H) 04/23/2014 1320   CREATININE 0.81 03/24/2016 0535   CREATININE 1.02 04/23/2014 1320   CALCIUM 8.6 (L) 03/24/2016 0535   CALCIUM 8.5 04/23/2014 1320   GFRNONAA >60 03/24/2016 0535   GFRNONAA 55 (L) 04/23/2014 1320   GFRNONAA 39 (L) 11/02/2013 0540   GFRAA >60 03/24/2016 0535   GFRAA >60 04/23/2014 1320   GFRAA 45 (L) 11/02/2013 0540   Estimated Creatinine Clearance: 43.6 mL/min (by C-G formula based on SCr of 0.81 mg/dL).  COAG Lab  Results  Component Value Date   INR 2.01 03/24/2016   INR 2.26 03/23/2016   INR 1.98 03/18/2015    Radiology Ct Abdomen Pelvis W Contrast  Result Date: 03/03/2016 CLINICAL DATA:  Frequent diarrhea. Right lower quadrant, left lower quadrant and right upper quadrant pain for 3 weeks EXAM: CT ABDOMEN AND PELVIS WITH CONTRAST TECHNIQUE: Multidetector CT imaging of the abdomen and pelvis was performed using the standard protocol following bolus administration of intravenous contrast. CONTRAST:  21mL ISOVUE-300 IOPAMIDOL (ISOVUE-300) INJECTION 61% COMPARISON:  None. FINDINGS: Lower chest: Bronchiectasis and scattered areas of peripheral tree-in-bud nodularity are noted within both lower lobes and right middle lobe and lingula. The heart size appears enlarged. No pericardial or pleural effusion. Hepatobiliary: No focal liver abnormality. Gallstone is identified within the gallbladder fundus. No biliary dilatation. Pancreas: The pancreas appears normal. Spleen: Normal appearance of the spleen. Adrenals/Urinary  Tract: Normal appearance of the adrenal glands. Right renal atrophy is identified. Bilateral renal cysts are identified. No mass or hydronephrosis. The urinary bladder is unremarkable. Stomach/Bowel: The stomach is normal. There is no pathologic dilatation of the small or large bowel loops. No abnormal wall thickening or inflammation identified. Colonic diverticula noted without acute inflammation. Vascular/Lymphatic: Aortic atherosclerosis. No enlarged upper abdominal lymph nodes. No pelvic or inguinal lymph nodes. Reproductive: A pessary device is identified within the vagina. Other: There is no ascites or focal fluid collections within the abdomen or pelvis. Musculoskeletal: Scoliosis deformity is convex towards the left. Marked multi level degenerative disc disease noted. Partial fusion of the L2 and L3 vertebra noted. Compression fracture involving T12 and L4 are again noted. IMPRESSION: 1. No acute findings within the abdomen or pelvis. 2. Aortic atherosclerosis 3. Gallstone 4. Pulmonary changes are identified including bronchiectasis and tree-in-bud nodularity. Findings are suggestive of chronic atypical process such as mycobacterium avium infection. 5. Scoliosis, degenerative disc disease and chronic vertebral correction fractures. Electronically Signed   By: Signa Kell M.D.   On: 03/03/2016 13:32   Ct Angio Abd/pel W/ And/or W/o  Result Date: 03/31/2016 CLINICAL DATA:  Lower abdominal pain for the past several months. History of diarrhea. History of SMA and iliac stents. EXAM: CTA ABDOMEN AND PELVIS WITH CONTRAST TECHNIQUE: Multidetector CT imaging of the abdomen and pelvis was performed using the standard protocol during bolus administration of intravenous contrast. Multiplanar reconstructed images and MIPs were obtained and reviewed to evaluate the vascular anatomy. CONTRAST:  100 cc Isovue 370 COMPARISON:  CT abdomen pelvis - 03/03/2016; 08/05/2015 FINDINGS: VASCULAR Aorta: There is a moderate to  large amount of eccentric mixed calcified and noncalcified atherosclerotic plaque within a tortuous but normal caliber abdominal aorta, not resulting in hemodynamically significant stenosis. No abdominal aortic dissection or periaortic stranding. Celiac: A stent is seen within the origin and proximal aspect of the celiac artery. The stent appears widely patent without hemodynamically significant narrowing. Conventional branching pattern. There is irregularity involving the mid/distal aspect of the left gastric artery with apparent pseudoaneurysm and extravasation (axial images 21 and 22, sagittal image 103, series 9) about the lesser curvature of the stomach. 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 There is early reconstitution of the mid / distal aspect of the main trunk of the SMA and via hypertrophied collateral supply via the IMA and pancreaticoduodenal collaterals from the celiac. Renals: The left renal artery is duplicated with  a tiny accessory left-sided renal artery which supplies the inferior pole the left kidney. There is a moderate to large amount of eccentric mixed calcified and noncalcified atherosclerotic plaque involving the origin of the dominant left renal artery resulting in focal severe (current 75%) luminal narrowing (coronal image 71, series 8). Solitary right-sided renal artery with subtotal occlusion in asymmetric atrophy of the right kidney. IMA: Widely patent and provides hypertrophied collateral supply the SMA distribution. Inflow: There is a moderate amount of eccentric mixed calcified and noncalcified atherosclerotic plaque when the bilateral common iliac arteries, not resulting in hemodynamically significant stenosis. The bilateral internal iliac arteries are heavily disease though patent of normal caliber. The bilateral  external iliac arteries are widely patent without hemodynamically significant stenosis. Proximal Outflow: Minimal amount of eccentric predominantly calcified atherosclerotic plaque when the bilateral common and superficial femoral arteries, not resulting in hemodynamically significant stenosis. Veins: Appears widely patent on this CTA examination. Review of the MIP images confirms the above findings. NON-VASCULAR Lower chest: Limited visualization of lower thorax demonstrates trace left-sided pleural effusion. Ill-defined slightly nodular opacities are seen with the right costophrenic angle (image 8, series 7, not seen on the 03/03/2016 examination. Cardiomegaly. Pacer leads terminate within the right atrium and ventricle. No pericardial fusion. Hepatobiliary: Mild nodularity hepatic contour. Punctate granulomas scattered throughout the liver. Normal appearance of the gallbladder given degree distention. No radiopaque gallstones. No intra or extrahepatic biliary ductal dilatation. No ascites. Pancreas: Normal appearance of the pancreas. Spleen: Normal appearance of the spleen. Adrenals/Urinary Tract: Re- demonstrated asymmetric atrophy involving the right kidney. Unchanged approximately 1.6 cm partially exophytic hypo attenuating (15 Hounsfield unit) left-sided renal cysts. Adjacent approximately 8 mm hypo attenuating left-sided renal lesion is too small to adequately characterize though also favored to represent a renal cyst. Stomach/Bowel: There is ill-defined thickening and suspected hemorrhage about the mid body of the stomach. This finding is associated with irregularity involving the distal aspect of the left gastric artery (axial image 22, series 6, sagittal image 103, series 9). Rather extensive colonic diverticulosis without definitive evidence of superimposed acute focal diverticulitis. There is rather diffuse apparent colonic wall thickening with associated adjacent mesenteric stranding (representative  images 45 and 62). Normal appearance the terminal ileum. The appendix is not visualized, however there is no pericecal inflammatory change. No pneumoperitoneum, pneumatosis or portal venous gas. Lymphatic: Scattered retroperitoneal lymph nodes are numerous though individually not enlarged by size criteria with index aortocaval lymph node measuring 0.9 cm in greatest short axis diameter (image 34, series 6). No bulky retroperitoneal, mesenteric, pelvic or inguinal lymphadenopathy. Reproductive: A pessary device is noted within the vagina. Post hysterectomy. No discrete adnexal lesion. Other: There is a small amount of blood seen within the lower pelvis (image 87, series 6). Musculoskeletal: Re- demonstrated moderate severe scoliotic curvature of the thoracolumbar spine with dominant caudal component convex to the left. Grossly unchanged severe (and approximately 7%) compression deformities involving the superior endplates of the T12 and to a lesser extent, the L4 vertebral bodies. Moderate severe multilevel lumbar spine DDD. IMPRESSION: VASCULAR 1. Apparent vessel irregularity involving the left gastric artery with apparent pseudoaneurysm and potential small area of active extravasation. This finding is associated with a small amount of blood about the lesser curvature of the stomach and dependent portion of the pelvis. Further evaluation with endoscopy could performed as indicated. 2. Moderate to large amount of mixed calcified and noncalcified atherosclerotic plaque within a normal caliber abdominal aorta, not resulting in a hemodynamically significant stenosis. Aortic Atherosclerosis (  ICD10-170.0) 3. 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 4. Post stenting of the celiac artery origin without a residual or recurrent stenosis. NON-VASCULAR 1. Gastric findings as detailed above. 2. Rather  extensive colonic wall thickening without evidence of enteric obstruction, nonspecific though potentially reactive in the setting of peritoneal blood. No evidence of pneumatosis or portal venous gas. No definable/drainable intra-abdominal or pelvic fluid collection. 3. Colonic diverticulosis without evidence of diverticulitis. Critical Value/emergent results were called by telephone at the time of interpretation on 03/31/2016 at 3:34 pm to Dr. Levora Dredge , who verbally acknowledged these results. Electronically Signed   By: Simonne Come M.D.   On: 03/31/2016 15:39    Assessment/Plan 1. Acute blood loss anemia I believe most likely secondary to the groin hematoma I will recheck her Hgb tomorrow and if <7.0 will plan for transfusion, this was discussed the risks and benefits reviewed and all questions were answered regarding transfusion.  - CBC  2. Chronic mesenteric ischemia (HCC) Recommend:  The patient is status post successful angiogram with intervention of the mesenteric vessels. Angioplasty and Lifestream stent placement with PTA to 7 mm was performed.  was performed.  The patient reports that the abdominal pain is not improved and the post prandial symptoms are still present.   The patient continues to voice lifestyle limiting changes at this point in time.  No further invasive studies, angiography or surgery at this time The patient should continue walking and begin a more formal exercise program.  The patient should continue antiplatelet therapy and aggressive treatment of the lipid abnormalities  Smoking cessation was again discussed  Patient should undergo noninvasive studies as ordered. The patient will follow up with me after the studies.    3. Chronic atrial fibrillation (HCC) Continue coumadin and antiarrhythmic medications  4. Atherosclerosis of native artery of both lower extremities with intermittent claudication (HCC)  Recommend:  The patient has evidence of  atherosclerosis of the lower extremities with claudication.  The patient does not voice lifestyle limiting changes at this point in time.  Noninvasive studies do not suggest clinically significant change.  No invasive studies, angiography or surgery at this time The patient should continue walking and begin a more formal exercise program.  The patient should continue antiplatelet therapy and aggressive treatment of the lipid abnormalities  No changes in the patient's medications at this time  The patient should continue wearing graduated compression socks to control the mild edema.   5. Chronic venous insufficiency No surgery or intervention at this point in time.    I have had a long discussion with the patient regarding venous insufficiency and why it  causes symptoms. I have discussed with the patient the chronic skin changes that accompany venous insufficiency and the long term sequela such as infection and ulceration.  Patient will begin wearing graduated compression stockings class 1 (20-30 mmHg) or compression wraps on a daily basis a prescription was given. The patient will put the stockings on first thing in the morning and removing them in the evening. The patient is instructed specifically not to sleep in the stockings.    In addition, behavioral modification including several periods of elevation of the lower extremities during the day will be continued. I have demonstrated that proper elevation is a position with the ankles at heart level.  The patient is instructed to begin routine exercise, especially walking  on a daily basis   Levora Dredge, MD  04/01/2016 5:49 PM

## 2016-04-02 ENCOUNTER — Other Ambulatory Visit
Admission: RE | Admit: 2016-04-02 | Discharge: 2016-04-02 | Disposition: A | Discharge status: Home / Self Care | Payer: PPO | Source: Ambulatory Visit | Attending: Vascular Surgery | Admitting: Vascular Surgery

## 2016-04-02 DIAGNOSIS — D62 Acute posthemorrhagic anemia: Secondary | ICD-10-CM | POA: Diagnosis not present

## 2016-04-02 LAB — CBC
HCT: 25.1 % — ABNORMAL LOW (ref 35.0–47.0)
Hemoglobin: 8.1 g/dL — ABNORMAL LOW (ref 12.0–16.0)
MCH: 33.2 pg (ref 26.0–34.0)
MCHC: 32.4 g/dL (ref 32.0–36.0)
MCV: 102.5 fL — AB (ref 80.0–100.0)
PLATELETS: 186 10*3/uL (ref 150–440)
RBC: 2.45 MIL/uL — AB (ref 3.80–5.20)
RDW: 18.1 % — AB (ref 11.5–14.5)
WBC: 6.1 10*3/uL (ref 3.6–11.0)

## 2016-04-06 ENCOUNTER — Encounter (INDEPENDENT_AMBULATORY_CARE_PROVIDER_SITE_OTHER): Payer: PPO | Admitting: Vascular Surgery

## 2016-04-06 DIAGNOSIS — I482 Chronic atrial fibrillation: Secondary | ICD-10-CM | POA: Diagnosis not present

## 2016-04-08 ENCOUNTER — Encounter (INDEPENDENT_AMBULATORY_CARE_PROVIDER_SITE_OTHER): Payer: Self-pay | Admitting: Vascular Surgery

## 2016-04-08 ENCOUNTER — Ambulatory Visit (INDEPENDENT_AMBULATORY_CARE_PROVIDER_SITE_OTHER): Payer: PPO | Admitting: Vascular Surgery

## 2016-04-08 VITALS — BP 129/78 | HR 73 | Resp 16 | Ht 60.0 in | Wt 138.0 lb

## 2016-04-08 DIAGNOSIS — I872 Venous insufficiency (chronic) (peripheral): Secondary | ICD-10-CM | POA: Diagnosis not present

## 2016-04-08 DIAGNOSIS — I70213 Atherosclerosis of native arteries of extremities with intermittent claudication, bilateral legs: Secondary | ICD-10-CM | POA: Diagnosis not present

## 2016-04-08 DIAGNOSIS — K551 Chronic vascular disorders of intestine: Secondary | ICD-10-CM | POA: Diagnosis not present

## 2016-04-08 DIAGNOSIS — S301XXD Contusion of abdominal wall, subsequent encounter: Secondary | ICD-10-CM | POA: Diagnosis not present

## 2016-04-08 DIAGNOSIS — I482 Chronic atrial fibrillation, unspecified: Secondary | ICD-10-CM

## 2016-04-08 MED ORDER — DIPHENOXYLATE-ATROPINE 2.5-0.025 MG PO TABS: 1.0000 | ORAL_TABLET | Freq: Four times a day (QID) | ORAL | Status: DC | PRN

## 2016-04-08 MED ORDER — DIPHENOXYLATE-ATROPINE 2.5-0.025 MG PO TABS: 1.0000 | ORAL_TABLET | Freq: Four times a day (QID) | ORAL | 0 refills | Status: DC | PRN

## 2016-04-08 NOTE — Progress Notes (Signed)
MRN : [id number]  Joanna Roberts is a 82 y.o. (06-15-1928) female who presents with chief complaint of  Chief Complaint  Patient presents with  . Re-evaluation    2 week follow up  .  History of Present Illness: the patient returns to the office today for follow-up regarding swelling of the right lower extremity. She is status post and plasty and stent placement within the celiac artery last month. Postoperatively she was noted to have a moderate sized right groin hematoma. Since the intervention she continues to have vague abdominal pain complaints. She states she is still not eating. No nausea or vomiting but her diarrhea continues. Imodium has been ineffectual.  Current Meds  Medication Sig  . aspirin EC 81 MG EC tablet Take 1 tablet (81 mg total) by mouth daily.  . budesonide (ENTOCORT EC) 3 MG 24 hr capsule Take 3 mg by mouth 3 (three) times daily.   . carvedilol (COREG) 25 MG tablet TAKE ONE TABLET BY MOUTH TWICE DAILY WITH  MEALS  . docusate sodium (COLACE) 100 MG capsule Take 1 capsule (100 mg total) by mouth 2 (two) times daily as needed (take to keep stool soft.).  Marland Kitchen fluconazole (DIFLUCAN) 150 MG tablet   . furosemide (LASIX) 20 MG tablet Take 1 tablet (20 mg total) by mouth daily. (Patient taking differently: Take 20 mg by mouth daily as needed for fluid. )  . gabapentin (NEURONTIN) 300 MG capsule   . HYDROcodone-acetaminophen (NORCO/VICODIN) 5-325 MG tablet Limit one half to one tab by mouth per day or 2-3 times per day if tolerated  . levothyroxine (SYNTHROID, LEVOTHROID) 25 MCG tablet Take 25 mcg by mouth daily before breakfast.  . loperamide (ANTI-DIARRHEAL) 2 MG capsule Take 2 mg by mouth as needed for diarrhea or loose stools.  Marland Kitchen losartan (COZAAR) 25 MG tablet TAKE ONE TABLET EVERY DAY  . nitroGLYCERIN (NITROSTAT) 0.4 MG SL tablet Place 0.4 mg under the tongue every 5 (five) minutes as needed for chest pain.   Marland Kitchen omeprazole (PRILOSEC) 20 MG capsule   . ondansetron (ZOFRAN)  4 MG tablet   . silver sulfADIAZINE (SILVADENE) 1 % cream Apply 1 application topically 2 (two) times daily as needed.   . temazepam (RESTORIL) 7.5 MG capsule   . warfarin (COUMADIN) 2 MG tablet Take 2 mg by mouth daily.   Current Facility-Administered Medications for the 04/08/16 encounter (Office Visit) with Renford Dills, MD  Medication  . mirabegron ER (MYRBETRIQ) tablet 25 mg    Past Medical History:  Diagnosis Date  . AICD (automatic cardioverter/defibrillator) present   . Allergy   . Anemia   . Arthritis   . Cataract   . CHF (congestive heart failure) (HCC)   . Depression   . Dysrhythmia   . GERD (gastroesophageal reflux disease)   . Heart murmur   . Hypertension   . Myocardial infarction   . Osteoporosis   . Presence of permanent cardiac pacemaker   . Thyroid disease     Past Surgical History:  Procedure Laterality Date  . ABDOMINAL HYSTERECTOMY    . APPENDECTOMY    . HERNIA REPAIR    . INSERT / REPLACE / REMOVE PACEMAKER    . PERIPHERAL VASCULAR CATHETERIZATION Right 10/08/2014   Procedure: Lower Extremity Angiography;  Surgeon: Renford Dills, MD;  Location: [facility];  Service: Cardiovascular;  Laterality: Right;  . PERIPHERAL VASCULAR CATHETERIZATION  10/08/2014   Procedure: Lower Extremity Intervention;  Surgeon: Latina Craver  Kellsey Sansone, MD;  Location: [facility];  Service: Cardiovascular;;  . PERIPHERAL VASCULAR CATHETERIZATION N/A 03/23/2016   Procedure: Visceral Angiography;  Surgeon: Renford Dills, MD;  Location: [facility];  Service: Cardiovascular;  Laterality: N/A;    Social History Social History  Substance Use Topics  . Smoking status: Never Smoker  . Smokeless tobacco: Never Used  . Alcohol use No    Family History Family History  Problem Relation Age of Onset  . Early death Mother   . Heart disease Mother   . Arthritis Father   . Asthma Father   . Stroke Sister   No family history of bleeding/clotting  disorders, porphyria or autoimmune disease   Allergies  Allergen Reactions  . Lortab [Hydrocodone-Acetaminophen] Itching, Swelling and Rash  . Doxycycline Diarrhea  . Macrobid [Nitrofurantoin Monohyd Macro] Diarrhea and Nausea And Vomiting  . Nitrofurantoin Diarrhea, Nausea Only and Nausea And Vomiting     REVIEW OF SYSTEMS (Negative unless checked)  Constitutional: [] Weight loss  [] Fever  [] Chills Cardiac: [] Chest pain   [] Chest pressure   [] Palpitations   [] Shortness of breath when laying flat   [] Shortness of breath with exertion. Vascular:  [x] Pain in legs with walking   [] Pain in legs at rest  [] History of DVT   [] Phlebitis   [x] Swelling in legs   [] Varicose veins   [] Non-healing ulcers Pulmonary:   [] Uses home oxygen   [] Productive cough   [] Hemoptysis   [] Wheeze  [] COPD   [] Asthma Neurologic:  [] Dizziness   [] Seizures   [] History of stroke   [] History of TIA  [] Aphasia   [] Vissual changes   [] Weakness or numbness in arm   [x] Weakness or numbness in leg Musculoskeletal:   [] Joint swelling   [x] Joint pain   [] Low back pain Hematologic:  [] Easy bruising  [] Easy bleeding   [] Hypercoagulable state   [] Anemic Gastrointestinal:  [x] Diarrhea   [] Vomiting  [] Gastroesophageal reflux/heartburn   [] Difficulty swallowing. Genitourinary:  [] Chronic kidney disease   [] Difficult urination  [] Frequent urination   [] Blood in urine Skin:  [] Rashes   [] Ulcers  Psychological:  [] History of anxiety   []  History of major depression.  Physical Examination  Vitals:   04/08/16 1121  BP: 129/78  Pulse: 73  Resp: 16  Weight: 138 lb (62.6 kg)  Height: 5' (1.524 m)   Body mass index is 26.95 kg/m. Gen: WD/WN, NAD Head: [place]/AT, No temporalis wasting.  Ear/Nose/Throat: Hearing grossly intact, nares w/o erythema or drainage, poor dentition Eyes: PER, EOMI, sclera nonicteric.  Neck: Supple, no masses.  No bruit or JVD.  Pulmonary:  Good air movement, clear to auscultation bilaterally, no use of  accessory muscles.  Cardiac: RRR, normal S1, S2, no Murmurs. Vascular: right leg demonstrates ecchymoses consistent with her hematoma. There is no evidence for ulceration or skin breakdown. There is 2-3+ pitting edema. Vessel Right Left  Radial Palpable Palpable  Ulnar Palpable Palpable  Brachial Palpable Palpable  Carotid Palpable Palpable  Femoral Palpable Palpable  Popliteal Not Palpable Not Palpable  PT Not Palpable Not Palpable  DP Not Palpable Not Palpable   Gastrointestinal: soft, non-distended. No guarding/no peritoneal signs.  Musculoskeletal: M/S 5/5 throughout.  No deformity or atrophy.  Neurologic: CN 2-12 intact. Pain and light touch intact in extremities.  Symmetrical.  Speech is fluent. Motor exam as listed above. Psychiatric: Judgment intact, Mood & affect appropriate for pt's clinical situation. Dermatologic: No rashes or ulcers noted.  No changes consistent with cellulitis. Lymph : No Cervical  lymphadenopathy, no lichenification or skin changes of chronic lymphedema.  CBC Lab Results  Component Value Date   WBC 6.1 04/02/2016   HGB 8.1 (L) 04/02/2016   HCT 25.1 (L) 04/02/2016   MCV 102.5 (H) 04/02/2016   PLT 186 04/02/2016    BMET    Component Value Date/Time   NA 139 03/24/2016 0535   NA 140 04/23/2014 1320   K 4.5 03/24/2016 0535   K 4.8 04/23/2014 1320   CL 108 03/24/2016 0535   CL 109 (H) 04/23/2014 1320   CO2 27 03/24/2016 0535   CO2 26 04/23/2014 1320   GLUCOSE 131 (H) 03/24/2016 0535   GLUCOSE 95 04/23/2014 1320   BUN 18 03/24/2016 0535   BUN 22 (H) 04/23/2014 1320   CREATININE 0.81 03/24/2016 0535   CREATININE 1.02 04/23/2014 1320   CALCIUM 8.6 (L) 03/24/2016 0535   CALCIUM 8.5 04/23/2014 1320   GFRNONAA >60 03/24/2016 0535   GFRNONAA 55 (L) 04/23/2014 1320   GFRNONAA 39 (L) 11/02/2013 0540   GFRAA >60 03/24/2016 0535   GFRAA >60 04/23/2014 1320   GFRAA 45 (L) 11/02/2013 0540   Estimated Creatinine Clearance: 41.9 mL/min (by C-G  formula based on SCr of 0.81 mg/dL).  COAG Lab Results  Component Value Date   INR 2.01 03/24/2016   INR 2.26 03/23/2016   INR 1.98 03/18/2015    Radiology Ct Angio Abd/pel W/ And/or W/o  Result Date: 03/31/2016 CLINICAL DATA:  Lower abdominal pain for the past several months. History of diarrhea. History of SMA and iliac stents. EXAM: CTA ABDOMEN AND PELVIS WITH CONTRAST TECHNIQUE: Multidetector CT imaging of the abdomen and pelvis was performed using the standard protocol during bolus administration of intravenous contrast. Multiplanar reconstructed images and MIPs were obtained and reviewed to evaluate the vascular anatomy. CONTRAST:  100 cc Isovue 370 COMPARISON:  CT abdomen pelvis - 03/03/2016; 08/05/2015 FINDINGS: VASCULAR Aorta: There is a moderate to large amount of eccentric mixed calcified and noncalcified atherosclerotic plaque within a tortuous but normal caliber abdominal aorta, not resulting in hemodynamically significant stenosis. No abdominal aortic dissection or periaortic stranding. Celiac: A stent is seen within the origin and proximal aspect of the celiac artery. The stent appears widely patent without hemodynamically significant narrowing. Conventional branching pattern. There is irregularity involving the mid/distal aspect of the left gastric artery with apparent pseudoaneurysm and extravasation (axial images 21 and 22, sagittal image 103, series 9) about the lesser curvature of the stomach. 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 There is early reconstitution of the mid / distal aspect of the main trunk of the SMA and via hypertrophied collateral supply via the IMA and pancreaticoduodenal collaterals from the celiac. Renals: The left renal artery is duplicated with a tiny accessory left-sided renal artery which  supplies the inferior pole the left kidney. There is a moderate to large amount of eccentric mixed calcified and noncalcified atherosclerotic plaque involving the origin of the dominant left renal artery resulting in focal severe (current 75%) luminal narrowing (coronal image 71, series 8). Solitary right-sided renal artery with subtotal occlusion in asymmetric atrophy of the right kidney. IMA: Widely patent and provides hypertrophied collateral supply the SMA distribution. Inflow: There is a moderate amount of eccentric mixed calcified and noncalcified atherosclerotic plaque when the bilateral common iliac arteries, not resulting in hemodynamically significant stenosis. The bilateral  internal iliac arteries are heavily disease though patent of normal caliber. The bilateral external iliac arteries are widely patent without hemodynamically significant stenosis. Proximal Outflow: Minimal amount of eccentric predominantly calcified atherosclerotic plaque when the bilateral common and superficial femoral arteries, not resulting in hemodynamically significant stenosis. Veins: Appears widely patent on this CTA examination. Review of the MIP images confirms the above findings. NON-VASCULAR Lower chest: Limited visualization of lower thorax demonstrates trace left-sided pleural effusion. Ill-defined slightly nodular opacities are seen with the right costophrenic angle (image 8, series 7, not seen on the 03/03/2016 examination. Cardiomegaly. Pacer leads terminate within the right atrium and ventricle. No pericardial fusion. Hepatobiliary: Mild nodularity hepatic contour. Punctate granulomas scattered throughout the liver. Normal appearance of the gallbladder given degree distention. No radiopaque gallstones. No intra or extrahepatic biliary ductal dilatation. No ascites. Pancreas: Normal appearance of the pancreas. Spleen: Normal appearance of the spleen. Adrenals/Urinary Tract: Re- demonstrated asymmetric atrophy involving  the right kidney. Unchanged approximately 1.6 cm partially exophytic hypo attenuating (15 Hounsfield unit) left-sided renal cysts. Adjacent approximately 8 mm hypo attenuating left-sided renal lesion is too small to adequately characterize though also favored to represent a renal cyst. Stomach/Bowel: There is ill-defined thickening and suspected hemorrhage about the mid body of the stomach. This finding is associated with irregularity involving the distal aspect of the left gastric artery (axial image 22, series 6, sagittal image 103, series 9). Rather extensive colonic diverticulosis without definitive evidence of superimposed acute focal diverticulitis. There is rather diffuse apparent colonic wall thickening with associated adjacent mesenteric stranding (representative images 45 and 62). Normal appearance the terminal ileum. The appendix is not visualized, however there is no pericecal inflammatory change. No pneumoperitoneum, pneumatosis or portal venous gas. Lymphatic: Scattered retroperitoneal lymph nodes are numerous though individually not enlarged by size criteria with index aortocaval lymph node measuring 0.9 cm in greatest short axis diameter (image 34, series 6). No bulky retroperitoneal, mesenteric, pelvic or inguinal lymphadenopathy. Reproductive: A pessary device is noted within the vagina. Post hysterectomy. No discrete adnexal lesion. Other: There is a small amount of blood seen within the lower pelvis (image 87, series 6). Musculoskeletal: Re- demonstrated moderate severe scoliotic curvature of the thoracolumbar spine with dominant caudal component convex to the left. Grossly unchanged severe (and approximately 7%) compression deformities involving the superior endplates of the T12 and to a lesser extent, the L4 vertebral bodies. Moderate severe multilevel lumbar spine DDD. IMPRESSION: VASCULAR 1. Apparent vessel irregularity involving the left gastric artery with apparent pseudoaneurysm and  potential small area of active extravasation. This finding is associated with a small amount of blood about the lesser curvature of the stomach and dependent portion of the pelvis. Further evaluation with endoscopy could performed as indicated. 2. Moderate to large amount of mixed calcified and noncalcified atherosclerotic plaque within a normal caliber abdominal aorta, not resulting in a hemodynamically significant stenosis. Aortic Atherosclerosis (ICD10-170.0) 3. 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 4. Post stenting of the celiac artery origin without a residual or recurrent stenosis. NON-VASCULAR 1. Gastric findings as detailed above. 2. Rather extensive colonic wall thickening without evidence of enteric obstruction, nonspecific though potentially reactive in the setting of peritoneal blood. No evidence of pneumatosis or portal venous gas. No definable/drainable intra-abdominal or pelvic fluid collection. 3. Colonic diverticulosis without evidence of diverticulitis. Critical Value/emergent results were called by telephone at the time of interpretation  on 03/31/2016 at 3:34 pm to Dr. Levora Dredge , who verbally acknowledged these results. Electronically Signed   By: Simonne Come M.D.   On: 03/31/2016 15:39    Assessment/Plan 1. Chronic atrial fibrillation (HCC) Continue Coumadin.  2. Chronic mesenteric ischemia (HCC) Recommend:  The patient is status post successful angiogram with intervention.  The patient reports that her abdominal symptoms are slightly better.   The patient denies lifestyle limiting changes at this point in time with the exception that her diarrhea is persistent. She has been using over-the-counter Imodium which has not been effective. I have written for a few Lomotil and asked that she follow up with Dr. Arvilla Market in GI if this is effective for her  No further  invasive studies, angiography or surgery at this time The patient should continue walking and begin a more formal exercise program.  The patient should continue antiplatelet therapy and aggressive treatment of the lipid abnormalities  The patient should continue wearing graduated compression socks 10-15 mmHg strength to control the mild edema.  Patient should undergo noninvasive studies as ordered. The patient will follow up with me after the studies.   - VAS [place] MESENTERIC DUPLEX; Future  3. Atherosclerosis of native artery of both lower extremities with intermittent claudication (HCC)  Recommend:  The patient has evidence of atherosclerosis of the lower extremities with claudication.  The patient does not voice lifestyle limiting changes at this point in time.  No invasive studies, angiography or surgery at this time The patient should continue walking and begin a more formal exercise program.  The patient should continue antiplatelet therapy and aggressive treatment of the lipid abnormalities  No changes in the patient's medications at this time  The patient should continue wearing graduated compression socks 10-15 mmHg strength to control the mild edema.    4. Chronic venous insufficiency Continue graduated compression  5. Hematoma of groin, subsequent encounter This is improving and should be self-limited. I believe it is the reason her right leg is swelling have recommended frequent elevation and graduated compression. I will continue to follow. Her hemoglobin was increased on her follow-up lab test. No indication for transfusion at this time.    Levora Dredge, MD  04/08/2016 1:01 PM

## 2016-04-13 DIAGNOSIS — D649 Anemia, unspecified: Secondary | ICD-10-CM | POA: Diagnosis not present

## 2016-04-13 DIAGNOSIS — I4891 Unspecified atrial fibrillation: Secondary | ICD-10-CM | POA: Diagnosis not present

## 2016-04-15 ENCOUNTER — Ambulatory Visit: Payer: PPO | Admitting: Obstetrics and Gynecology

## 2016-04-21 DIAGNOSIS — F4321 Adjustment disorder with depressed mood: Secondary | ICD-10-CM | POA: Diagnosis not present

## 2016-04-21 DIAGNOSIS — G4700 Insomnia, unspecified: Secondary | ICD-10-CM | POA: Diagnosis not present

## 2016-04-22 ENCOUNTER — Other Ambulatory Visit
Admission: RE | Admit: 2016-04-22 | Discharge: 2016-04-22 | Disposition: A | Discharge status: Home / Self Care | Payer: PPO | Source: Ambulatory Visit | Attending: Nurse Practitioner | Admitting: Nurse Practitioner

## 2016-04-22 DIAGNOSIS — I4891 Unspecified atrial fibrillation: Secondary | ICD-10-CM | POA: Diagnosis not present

## 2016-04-22 DIAGNOSIS — R197 Diarrhea, unspecified: Secondary | ICD-10-CM | POA: Diagnosis not present

## 2016-04-22 LAB — C DIFFICILE QUICK SCREEN W PCR REFLEX
C Diff antigen: NEGATIVE
C Diff interpretation: NOT DETECTED
C Diff toxin: NEGATIVE

## 2016-04-29 ENCOUNTER — Telehealth (INDEPENDENT_AMBULATORY_CARE_PROVIDER_SITE_OTHER): Payer: Self-pay | Admitting: Vascular Surgery

## 2016-04-29 NOTE — Telephone Encounter (Signed)
Patient called and said that she is out of the diarrhea medication that Dr. Gilda Crease prescribed her. She is wondering if someone can call in a prescription for her so she doesn't have to come and pick it up. She has spoken to her NP, Amedeo Kinsman and she suggested calling us. Prescription name is Diphenoyla Atrophine. Call back #[phone]

## 2016-04-29 NOTE — Telephone Encounter (Signed)
Called and let the patient know that per Dr. Gilda Crease she will have to see her PCP or a GI physician because the medication that was given was just enough to get her through to being seen for her diarrhea. Patient thought that she was to see someone else regarding this as well.

## 2016-05-04 ENCOUNTER — Ambulatory Visit: Payer: PPO | Admitting: Obstetrics and Gynecology

## 2016-05-06 DIAGNOSIS — I4891 Unspecified atrial fibrillation: Secondary | ICD-10-CM | POA: Diagnosis not present

## 2016-05-17 ENCOUNTER — Telehealth (INDEPENDENT_AMBULATORY_CARE_PROVIDER_SITE_OTHER): Payer: Self-pay

## 2016-05-17 NOTE — Telephone Encounter (Signed)
Patient called stating that her leg is weeping and discolored and blue above the place that's weeping. Patient wants to know what to do?

## 2016-05-19 DIAGNOSIS — I495 Sick sinus syndrome: Secondary | ICD-10-CM | POA: Diagnosis not present

## 2016-05-19 DIAGNOSIS — I4891 Unspecified atrial fibrillation: Secondary | ICD-10-CM | POA: Diagnosis not present

## 2016-05-19 DIAGNOSIS — I739 Peripheral vascular disease, unspecified: Secondary | ICD-10-CM | POA: Diagnosis not present

## 2016-05-19 DIAGNOSIS — I429 Cardiomyopathy, unspecified: Secondary | ICD-10-CM | POA: Diagnosis not present

## 2016-05-19 DIAGNOSIS — K529 Noninfective gastroenteritis and colitis, unspecified: Secondary | ICD-10-CM | POA: Diagnosis not present

## 2016-05-21 ENCOUNTER — Encounter (INDEPENDENT_AMBULATORY_CARE_PROVIDER_SITE_OTHER): Payer: Self-pay

## 2016-05-21 ENCOUNTER — Ambulatory Visit (INDEPENDENT_AMBULATORY_CARE_PROVIDER_SITE_OTHER): Payer: PPO | Admitting: Nurse Practitioner

## 2016-05-21 VITALS — BP 91/60 | HR 79 | Resp 16

## 2016-05-21 DIAGNOSIS — M7989 Other specified soft tissue disorders: Secondary | ICD-10-CM

## 2016-05-21 NOTE — Progress Notes (Signed)
History of Present Illness  There is no documented history at this time  Assessments & Plan   There are no diagnoses linked to this encounter.    Additional instructions  Subjective:  Patient presents with venous ulcer of the Left lower extremity.    Procedure:  3 layer unna wrap was placed Left lower extremity.   Plan:   Follow up in one week.  

## 2016-05-26 DIAGNOSIS — F5101 Primary insomnia: Secondary | ICD-10-CM | POA: Diagnosis not present

## 2016-05-26 DIAGNOSIS — Z0000 Encounter for general adult medical examination without abnormal findings: Secondary | ICD-10-CM | POA: Diagnosis not present

## 2016-05-26 DIAGNOSIS — D649 Anemia, unspecified: Secondary | ICD-10-CM | POA: Diagnosis not present

## 2016-05-26 DIAGNOSIS — N390 Urinary tract infection, site not specified: Secondary | ICD-10-CM | POA: Diagnosis not present

## 2016-05-26 DIAGNOSIS — I10 Essential (primary) hypertension: Secondary | ICD-10-CM | POA: Diagnosis not present

## 2016-05-26 DIAGNOSIS — I4891 Unspecified atrial fibrillation: Secondary | ICD-10-CM | POA: Diagnosis not present

## 2016-05-26 DIAGNOSIS — K210 Gastro-esophageal reflux disease with esophagitis: Secondary | ICD-10-CM | POA: Diagnosis not present

## 2016-05-26 DIAGNOSIS — E538 Deficiency of other specified B group vitamins: Secondary | ICD-10-CM | POA: Diagnosis not present

## 2016-05-26 DIAGNOSIS — K551 Chronic vascular disorders of intestine: Secondary | ICD-10-CM | POA: Diagnosis not present

## 2016-05-26 DIAGNOSIS — E039 Hypothyroidism, unspecified: Secondary | ICD-10-CM | POA: Diagnosis not present

## 2016-05-27 ENCOUNTER — Ambulatory Visit (INDEPENDENT_AMBULATORY_CARE_PROVIDER_SITE_OTHER): Payer: PPO | Admitting: Vascular Surgery

## 2016-05-27 ENCOUNTER — Encounter (INDEPENDENT_AMBULATORY_CARE_PROVIDER_SITE_OTHER): Payer: Self-pay | Admitting: Vascular Surgery

## 2016-05-27 VITALS — BP 88/67 | HR 80 | Resp 16

## 2016-05-27 DIAGNOSIS — I83229 Varicose veins of left lower extremity with both ulcer of unspecified site and inflammation: Secondary | ICD-10-CM | POA: Diagnosis not present

## 2016-05-27 DIAGNOSIS — L97929 Non-pressure chronic ulcer of unspecified part of left lower leg with unspecified severity: Secondary | ICD-10-CM | POA: Insufficient documentation

## 2016-05-27 DIAGNOSIS — I482 Chronic atrial fibrillation, unspecified: Secondary | ICD-10-CM

## 2016-05-27 DIAGNOSIS — I70213 Atherosclerosis of native arteries of extremities with intermittent claudication, bilateral legs: Secondary | ICD-10-CM

## 2016-05-27 DIAGNOSIS — I872 Venous insufficiency (chronic) (peripheral): Secondary | ICD-10-CM | POA: Diagnosis not present

## 2016-05-27 NOTE — Progress Notes (Signed)
MRN : [id number]  Joanna Roberts is a 83 y.o. ([birth year]) female who presents with chief complaint of  Chief Complaint  Patient presents with  . Follow-up  .  History of Present Illness: Patient is seen for follow up evaluation of leg pain and swelling associated with venous ulceration. The patient was recently seen here and started on Unna boot therapy.  The swelling abruptly became much worse bilaterally and is associated with pain and discoloration. The pain and swelling worsens with prolonged dependency and improves with elevation.  The patient notes that in the morning the legs are better but the leg symptoms worsened throughout the course of the day. The patient has also noted a progressive worsening of the discoloration in the ankle and shin area.   The patient notes that an ulcer has developed acutely without specific trauma and since it occurred it has been very slow to heal.  There is a moderate amount of drainage associated with the open area.  The wound is also very painful.  The patient notes that they were not able to tolerate the Unna boot and removed it several days ago.  The patient states that they have been elevating as much as possible. The patient denies any recent changes in medications.  The patient denies a history of DVT or PE. There is no prior history of phlebitis. There is no history of primary lymphedema.  No SOB or increased cough.  No sputum production.  No recent episodes of CHF exacerbation.     Current Meds  Medication Sig  . aspirin EC 81 MG EC tablet Take 1 tablet (81 mg total) by mouth daily.  . budesonide (ENTOCORT EC) 3 MG 24 hr capsule Take 3 mg by mouth 3 (three) times daily.   . carvedilol (COREG) 25 MG tablet TAKE ONE TABLET BY MOUTH TWICE DAILY WITH  MEALS  . diphenoxylate-atropine (LOMOTIL) 2.5-0.025 MG tablet Take 1-2 tablets by mouth 4 (four) times daily as needed for diarrhea or loose stools.  . docusate sodium (COLACE) 100 MG capsule  Take 1 capsule (100 mg total) by mouth 2 (two) times daily as needed (take to keep stool soft.).  Marland Kitchen fluconazole (DIFLUCAN) 150 MG tablet   . furosemide (LASIX) 20 MG tablet Take 1 tablet (20 mg total) by mouth daily. (Patient taking differently: Take 20 mg by mouth daily as needed for fluid. )  . gabapentin (NEURONTIN) 300 MG capsule   . HYDROcodone-acetaminophen (NORCO/VICODIN) 5-325 MG tablet Limit one half to one tab by mouth per day or 2-3 times per day if tolerated  . levothyroxine (SYNTHROID, LEVOTHROID) 25 MCG tablet Take 25 mcg by mouth daily before breakfast.  . loperamide (ANTI-DIARRHEAL) 2 MG capsule Take 2 mg by mouth as needed for diarrhea or loose stools.  Marland Kitchen losartan (COZAAR) 25 MG tablet TAKE ONE TABLET EVERY DAY  . nitroGLYCERIN (NITROSTAT) 0.4 MG SL tablet Place 0.4 mg under the tongue every 5 (five) minutes as needed for chest pain.   Marland Kitchen omeprazole (PRILOSEC) 20 MG capsule   . ondansetron (ZOFRAN) 4 MG tablet   . silver sulfADIAZINE (SILVADENE) 1 % cream Apply 1 application topically 2 (two) times daily as needed.   . temazepam (RESTORIL) 7.5 MG capsule   . warfarin (COUMADIN) 2 MG tablet Take 2 mg by mouth daily.   Current Facility-Administered Medications for the 05/27/16 encounter (Office Visit) with Renford Dills, MD  Medication  . mirabegron ER (MYRBETRIQ) tablet 25 mg  Past Medical History:  Diagnosis Date  . AICD (automatic cardioverter/defibrillator) present   . Allergy   . Anemia   . Arthritis   . Cataract   . CHF (congestive heart failure) (HCC)   . Depression   . Dysrhythmia   . GERD (gastroesophageal reflux disease)   . Heart murmur   . Hypertension   . Myocardial infarction   . Osteoporosis   . Presence of permanent cardiac pacemaker   . Thyroid disease     Past Surgical History:  Procedure Laterality Date  . ABDOMINAL HYSTERECTOMY    . APPENDECTOMY    . HERNIA REPAIR    . INSERT / REPLACE / REMOVE PACEMAKER    . PERIPHERAL VASCULAR  CATHETERIZATION Right 10/08/2014   Procedure: Lower Extremity Angiography;  Surgeon: Renford Dills, MD;  Location: [facility] INVASIVE CV LAB;  Service: Cardiovascular;  Laterality: Right;  . PERIPHERAL VASCULAR CATHETERIZATION  10/08/2014   Procedure: Lower Extremity Intervention;  Surgeon: Renford Dills, MD;  Location: [facility] INVASIVE CV LAB;  Service: Cardiovascular;;  . PERIPHERAL VASCULAR CATHETERIZATION N/A 03/23/2016   Procedure: Visceral Angiography;  Surgeon: Renford Dills, MD;  Location: [facility] INVASIVE CV LAB;  Service: Cardiovascular;  Laterality: N/A;    Social History Social History  Substance Use Topics  . Smoking status: Never Smoker  . Smokeless tobacco: Never Used  . Alcohol use No    Family History Family History  Problem Relation Age of Onset  . Early death Mother   . Heart disease Mother   . Arthritis Father   . Asthma Father   . Stroke Sister   No family history of bleeding/clotting disorders, porphyria or autoimmune disease   Allergies  Allergen Reactions  . Lortab [Hydrocodone-Acetaminophen] Itching, Swelling and Rash  . Doxycycline Diarrhea  . Macrobid [Nitrofurantoin Monohyd Macro] Diarrhea and Nausea And Vomiting  . Nitrofurantoin Diarrhea, Nausea Only and Nausea And Vomiting     REVIEW OF SYSTEMS (Negative unless checked)  Constitutional: [] Weight loss  [] Fever  [] Chills Cardiac: [] Chest pain   [] Chest pressure   [] Palpitations   [] Shortness of breath when laying flat   [] Shortness of breath with exertion. Vascular:  [x] Pain in legs with walking   [x] Pain in legs at rest  [] History of DVT   [] Phlebitis   [x] Swelling in legs   [] Varicose veins   [x] Non-healing ulcers Pulmonary:   [] Uses home oxygen   [] Productive cough   [] Hemoptysis   [] Wheeze  [] COPD   [] Asthma Neurologic:  [] Dizziness   [] Seizures   [] History of stroke   [] History of TIA  [] Aphasia   [] Vissual changes   [] Weakness or numbness in arm   [] Weakness or numbness in  leg Musculoskeletal:   [] Joint swelling   [x] Joint pain   [] Low back pain Hematologic:  [x] Easy bruising  [] Easy bleeding   [] Hypercoagulable state   [] Anemic Gastrointestinal:  [] Diarrhea   [] Vomiting  [] Gastroesophageal reflux/heartburn   [] Difficulty swallowing. Genitourinary:  [] Chronic kidney disease   [] Difficult urination  [] Frequent urination   [] Blood in urine Skin:  [] Rashes   [] Ulcers  Psychological:  [] History of anxiety   []  History of major depression.  Physical Examination  Vitals:   05/27/16 0917  BP: (!) 88/67  Pulse: 80  Resp: 16   There is no height or weight on file to calculate BMI. Gen: WD/WN, NAD Head: [place]/AT, No temporalis wasting.  Ear/Nose/Throat: Hearing grossly intact, nares w/o erythema or drainage, poor dentition Eyes: PER, EOMI, sclera nonicteric.  Neck: Supple,  no masses.  No bruit or JVD.  Pulmonary:  Good air movement, clear to auscultation bilaterally, no use of accessory muscles.  Cardiac: RRR, normal S1, S2, no Murmurs. Vascular: bilateral edema 4+ pitting; left venous ulcer medial ankle clean good granulation superficial minimal drainage no odor; large ecchymosis of the anterior proximal shin left leg Vessel Right Left  Radial Palpable Palpable  Ulnar Palpable Palpable  Brachial Palpable Palpable  Carotid Palpable Palpable  Femoral Palpable Palpable  Popliteal Not Palpable Not Palpable  PT Not Palpable Not Palpable  DP Not Palpable Not Palpable   Gastrointestinal: soft, non-distended. No guarding/no peritoneal signs.  Musculoskeletal: M/S 5/5 throughout.  No deformity or atrophy.  Neurologic: CN 2-12 intact. Pain and light touch intact in extremities.  Symmetrical.  Speech is fluent. Motor exam as listed above. Psychiatric: Judgment intact, Mood & affect appropriate for pt's clinical situation. Dermatologic: No rashes or ulcers noted.  No changes consistent with cellulitis. Lymph : No Cervical lymphadenopathy, no lichenification or skin  changes of chronic lymphedema.  CBC Lab Results  Component Value Date   WBC 6.1 04/02/2016   HGB 8.1 (L) 04/02/2016   HCT 25.1 (L) 04/02/2016   MCV 102.5 (H) 04/02/2016   PLT 186 04/02/2016    BMET    Component Value Date/Time   NA 139 03/24/2016 0535   NA 140 04/23/2014 1320   K 4.5 03/24/2016 0535   K 4.8 04/23/2014 1320   CL 108 03/24/2016 0535   CL 109 (H) 04/23/2014 1320   CO2 27 03/24/2016 0535   CO2 26 04/23/2014 1320   GLUCOSE 131 (H) 03/24/2016 0535   GLUCOSE 95 04/23/2014 1320   BUN 18 03/24/2016 0535   BUN 22 (H) 04/23/2014 1320   CREATININE 0.81 03/24/2016 0535   CREATININE 1.02 04/23/2014 1320   CALCIUM 8.6 (L) 03/24/2016 0535   CALCIUM 8.5 04/23/2014 1320   GFRNONAA >60 03/24/2016 0535   GFRNONAA 55 (L) 04/23/2014 1320   GFRNONAA 39 (L) 11/02/2013 0540   GFRAA >60 03/24/2016 0535   GFRAA >60 04/23/2014 1320   GFRAA 45 (L) 11/02/2013 0540   CrCl cannot be calculated (Patient's most recent lab result is older than the maximum 21 days allowed.).  COAG Lab Results  Component Value Date   INR 2.01 03/24/2016   INR 2.26 03/23/2016   INR 1.98 03/18/2015    Radiology No results found.  Assessment/Plan 1. 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    I have had a long discussion with the patient regarding venous insufficiency and why it  causes symptoms, specifically venous ulceration . I have discussed with the patient the chronic skin changes that accompany venous insufficiency and the long term sequela such as infection and recurring  ulceration.  Patient will be placed in Unna Boots bilaterally which will be changed weekly drainage permitting.  In addition, behavioral modification including several periods of elevation of the lower extremities during the day will be continued. Achieving a position with the ankles at heart level was stressed to the patient  The patient is  instructed to begin routine exercise, especially walking on a daily basis  Following the review of the ultrasound the patient will follow up in one week to reassess the degree of swelling and the control that Unna therapy is offering.   The patient can be assessed for graduated compression stockings or wraps as well as a Lymph Pump once  the ulcers are healed.  2. Chronic venous insufficiency Continue Unna boot therapy for now consider compression wraps when ulcer is healed  3. Atherosclerosis of native artery of both lower extremities with intermittent claudication (HCC)  Recommend:  The patient has evidence of atherosclerosis of the lower extremities with claudication.  The patient does not voice lifestyle limiting changes at this point in time.  No invasive studies, angiography or surgery at this time.  However, if her wounds worsen then this must be considered.  Patient was informed.  ABI will be obtained at next visit.  The patient should continue walking and begin a more formal exercise program.  The patient should continue antiplatelet therapy and aggressive treatment of the lipid abnormalities  No changes in the patient's medications at this time  The patient should continue wearing graduated compression socks 10-15 mmHg strength to control the mild edema.    4. Chronic atrial fibrillation (HCC) I have called ask cardiology to ask they access for worsening CHF.    Levora Dredge, MD  05/27/2016 8:43 PM

## 2016-06-01 ENCOUNTER — Emergency Department: Payer: PPO

## 2016-06-01 ENCOUNTER — Inpatient Hospital Stay
Admission: EM | Admit: 2016-06-01 | Discharge: 2016-06-03 | DRG: 871 | Disposition: A | Discharge status: Home Health | Payer: PPO | Attending: Internal Medicine | Admitting: Internal Medicine

## 2016-06-01 ENCOUNTER — Encounter: Payer: Self-pay | Admitting: Medical Oncology

## 2016-06-01 DIAGNOSIS — K529 Noninfective gastroenteritis and colitis, unspecified: Secondary | ICD-10-CM | POA: Diagnosis present

## 2016-06-01 DIAGNOSIS — Z8261 Family history of arthritis: Secondary | ICD-10-CM | POA: Diagnosis not present

## 2016-06-01 DIAGNOSIS — Z95810 Presence of automatic (implantable) cardiac defibrillator: Secondary | ICD-10-CM | POA: Diagnosis not present

## 2016-06-01 DIAGNOSIS — Z7901 Long term (current) use of anticoagulants: Secondary | ICD-10-CM | POA: Diagnosis not present

## 2016-06-01 DIAGNOSIS — I482 Chronic atrial fibrillation: Secondary | ICD-10-CM | POA: Diagnosis not present

## 2016-06-01 DIAGNOSIS — Z825 Family history of asthma and other chronic lower respiratory diseases: Secondary | ICD-10-CM | POA: Diagnosis not present

## 2016-06-01 DIAGNOSIS — E039 Hypothyroidism, unspecified: Secondary | ICD-10-CM | POA: Diagnosis not present

## 2016-06-01 DIAGNOSIS — N390 Urinary tract infection, site not specified: Secondary | ICD-10-CM | POA: Diagnosis not present

## 2016-06-01 DIAGNOSIS — J101 Influenza due to other identified influenza virus with other respiratory manifestations: Secondary | ICD-10-CM

## 2016-06-01 DIAGNOSIS — E876 Hypokalemia: Secondary | ICD-10-CM | POA: Diagnosis not present

## 2016-06-01 DIAGNOSIS — I110 Hypertensive heart disease with heart failure: Secondary | ICD-10-CM | POA: Diagnosis present

## 2016-06-01 DIAGNOSIS — Z8249 Family history of ischemic heart disease and other diseases of the circulatory system: Secondary | ICD-10-CM | POA: Diagnosis not present

## 2016-06-01 DIAGNOSIS — Z7982 Long term (current) use of aspirin: Secondary | ICD-10-CM | POA: Diagnosis not present

## 2016-06-01 DIAGNOSIS — B964 Proteus (mirabilis) (morganii) as the cause of diseases classified elsewhere: Secondary | ICD-10-CM | POA: Diagnosis not present

## 2016-06-01 DIAGNOSIS — R110 Nausea: Secondary | ICD-10-CM | POA: Diagnosis not present

## 2016-06-01 DIAGNOSIS — A419 Sepsis, unspecified organism: Secondary | ICD-10-CM | POA: Diagnosis not present

## 2016-06-01 DIAGNOSIS — I5032 Chronic diastolic (congestive) heart failure: Secondary | ICD-10-CM | POA: Diagnosis not present

## 2016-06-01 DIAGNOSIS — J1089 Influenza due to other identified influenza virus with other manifestations: Secondary | ICD-10-CM | POA: Diagnosis not present

## 2016-06-01 DIAGNOSIS — I10 Essential (primary) hypertension: Secondary | ICD-10-CM | POA: Diagnosis not present

## 2016-06-01 DIAGNOSIS — R509 Fever, unspecified: Secondary | ICD-10-CM

## 2016-06-01 DIAGNOSIS — E43 Unspecified severe protein-calorie malnutrition: Secondary | ICD-10-CM | POA: Insufficient documentation

## 2016-06-01 DIAGNOSIS — M6281 Muscle weakness (generalized): Secondary | ICD-10-CM

## 2016-06-01 DIAGNOSIS — R05 Cough: Secondary | ICD-10-CM | POA: Diagnosis not present

## 2016-06-01 DIAGNOSIS — Z823 Family history of stroke: Secondary | ICD-10-CM

## 2016-06-01 DIAGNOSIS — Z66 Do not resuscitate: Secondary | ICD-10-CM | POA: Diagnosis not present

## 2016-06-01 DIAGNOSIS — I5023 Acute on chronic systolic (congestive) heart failure: Secondary | ICD-10-CM | POA: Diagnosis not present

## 2016-06-01 DIAGNOSIS — R2681 Unsteadiness on feet: Secondary | ICD-10-CM

## 2016-06-01 DIAGNOSIS — K219 Gastro-esophageal reflux disease without esophagitis: Secondary | ICD-10-CM | POA: Diagnosis not present

## 2016-06-01 DIAGNOSIS — I959 Hypotension, unspecified: Secondary | ICD-10-CM

## 2016-06-01 DIAGNOSIS — A4189 Other specified sepsis: Secondary | ICD-10-CM | POA: Diagnosis not present

## 2016-06-01 DIAGNOSIS — R059 Cough, unspecified: Secondary | ICD-10-CM

## 2016-06-01 DIAGNOSIS — M810 Age-related osteoporosis without current pathological fracture: Secondary | ICD-10-CM | POA: Diagnosis present

## 2016-06-01 DIAGNOSIS — J09X2 Influenza due to identified novel influenza A virus with other respiratory manifestations: Secondary | ICD-10-CM | POA: Diagnosis not present

## 2016-06-01 LAB — URINALYSIS, COMPLETE (UACMP) WITH MICROSCOPIC
Bilirubin Urine: NEGATIVE
GLUCOSE, UA: NEGATIVE mg/dL
Ketones, ur: 5 mg/dL — AB
NITRITE: NEGATIVE
PROTEIN: 30 mg/dL — AB
Specific Gravity, Urine: 1.013 (ref 1.005–1.030)
pH: 6 (ref 5.0–8.0)

## 2016-06-01 LAB — COMPREHENSIVE METABOLIC PANEL
ALBUMIN: 2.9 g/dL — AB (ref 3.5–5.0)
ALK PHOS: 33 U/L — AB (ref 38–126)
ALT: 15 U/L (ref 14–54)
ALT: 14 U/L (ref 14–54)
AST: 32 U/L (ref 15–41)
AST: 28 U/L (ref 15–41)
Albumin: 3.2 g/dL — ABNORMAL LOW (ref 3.5–5.0)
Alkaline Phosphatase: 33 U/L — ABNORMAL LOW (ref 38–126)
Anion gap: 8 (ref 5–15)
Anion gap: 6 (ref 5–15)
BUN: 22 mg/dL — ABNORMAL HIGH (ref 6–20)
BUN: 19 mg/dL (ref 6–20)
CALCIUM: 7.4 mg/dL — AB (ref 8.9–10.3)
CHLORIDE: 102 mmol/L (ref 101–111)
CHLORIDE: 106 mmol/L (ref 101–111)
CO2: 23 mmol/L (ref 22–32)
CO2: 24 mmol/L (ref 22–32)
CREATININE: 1.06 mg/dL — AB (ref 0.44–1.00)
Calcium: 8.1 mg/dL — ABNORMAL LOW (ref 8.9–10.3)
Creatinine, Ser: 1.37 mg/dL — ABNORMAL HIGH (ref 0.44–1.00)
GFR calc Af Amer: 54 mL/min — ABNORMAL LOW (ref >60)
GFR calc non Af Amer: 47 mL/min — ABNORMAL LOW (ref >60)
GFR, EST AFRICAN AMERICAN: 40 mL/min — AB (ref >60)
GFR, EST NON AFRICAN AMERICAN: 34 mL/min — AB (ref >60)
Glucose, Bld: 114 mg/dL — ABNORMAL HIGH (ref 65–99)
Glucose, Bld: 128 mg/dL — ABNORMAL HIGH (ref 65–99)
POTASSIUM: 3.6 mmol/L (ref 3.5–5.1)
Potassium: 3.1 mmol/L — ABNORMAL LOW (ref 3.5–5.1)
SODIUM: 136 mmol/L (ref 135–145)
Sodium: 133 mmol/L — ABNORMAL LOW (ref 135–145)
Total Bilirubin: 1.3 mg/dL — ABNORMAL HIGH (ref 0.3–1.2)
Total Bilirubin: 0.5 mg/dL (ref 0.3–1.2)
Total Protein: 6.1 g/dL — ABNORMAL LOW (ref 6.5–8.1)
Total Protein: 5.5 g/dL — ABNORMAL LOW (ref 6.5–8.1)

## 2016-06-01 LAB — LACTIC ACID, PLASMA
Lactic Acid, Venous: 1.1 mmol/L (ref 0.5–1.9)
Lactic Acid, Venous: 1.4 mmol/L (ref 0.5–1.9)

## 2016-06-01 LAB — CBC WITH DIFFERENTIAL/PLATELET
BASOS ABS: 0 10*3/uL (ref 0–0.1)
Basophils Relative: 1 %
EOS PCT: 1 %
Eosinophils Absolute: 0 10*3/uL (ref 0–0.7)
HCT: 30.6 % — ABNORMAL LOW (ref 35.0–47.0)
Hemoglobin: 10.3 g/dL — ABNORMAL LOW (ref 12.0–16.0)
LYMPHS PCT: 10 %
Lymphs Abs: 0.4 10*3/uL — ABNORMAL LOW (ref 1.0–3.6)
MCH: 32.9 pg (ref 26.0–34.0)
MCHC: 33.5 g/dL (ref 32.0–36.0)
MCV: 98.1 fL (ref 80.0–100.0)
MONO ABS: 0.3 10*3/uL (ref 0.2–0.9)
Monocytes Relative: 8 %
Neutro Abs: 3.6 10*3/uL (ref 1.4–6.5)
Neutrophils Relative %: 80 %
PLATELETS: 131 10*3/uL — AB (ref 150–440)
RBC: 3.12 MIL/uL — ABNORMAL LOW (ref 3.80–5.20)
RDW: 15.1 % — AB (ref 11.5–14.5)
WBC: 4.4 10*3/uL (ref 3.6–11.0)

## 2016-06-01 LAB — PROTIME-INR
INR: 2.07
PROTHROMBIN TIME: 23.6 s — AB (ref 11.4–15.2)

## 2016-06-01 LAB — INFLUENZA PANEL BY PCR (TYPE A & B)
INFLBPCR: NEGATIVE
Influenza A By PCR: POSITIVE — AB

## 2016-06-01 MED ORDER — ONDANSETRON HCL 4 MG/2ML IJ SOLN
4.0000 mg | Freq: Four times a day (QID) | INTRAMUSCULAR | Status: DC | PRN
  Administered 2016-06-01: 4 mg via INTRAVENOUS
  Filled 2016-06-01: qty 2

## 2016-06-01 MED ORDER — GABAPENTIN 300 MG PO CAPS
300.0000 mg | ORAL_CAPSULE | Freq: Every day | ORAL | Status: DC
  Administered 2016-06-01 – 2016-06-02 (×2): 300 mg via ORAL
  Filled 2016-06-01 (×2): qty 1

## 2016-06-01 MED ORDER — CEFTRIAXONE SODIUM 1 G IJ SOLR: 1.0000 g | INTRAMUSCULAR | Status: DC

## 2016-06-01 MED ORDER — PANTOPRAZOLE SODIUM 40 MG PO TBEC: 40.0000 mg | DELAYED_RELEASE_TABLET | Freq: Every day | ORAL | Status: DC | PRN

## 2016-06-01 MED ORDER — DEXTROSE 5 % IV SOLN: 1.0000 g | Freq: Once | INTRAVENOUS | Status: DC

## 2016-06-01 MED ORDER — LEVOTHYROXINE SODIUM 50 MCG PO TABS
25.0000 ug | ORAL_TABLET | Freq: Every day | ORAL | Status: DC
  Administered 2016-06-02 – 2016-06-03 (×2): 25 ug via ORAL
  Filled 2016-06-01 (×2): qty 1

## 2016-06-01 MED ORDER — WARFARIN - PHARMACIST DOSING INPATIENT
Freq: Every day | Status: DC
  Filled 2016-06-01 (×2): qty 1

## 2016-06-01 MED ORDER — SODIUM CHLORIDE 0.9 % IV SOLN
Freq: Once | INTRAVENOUS | Status: AC
  Administered 2016-06-01: 13:00:00 via INTRAVENOUS

## 2016-06-01 MED ORDER — OSELTAMIVIR PHOSPHATE 75 MG PO CAPS
75.0000 mg | ORAL_CAPSULE | Freq: Once | ORAL | Status: AC
  Administered 2016-06-01: 75 mg via ORAL
  Filled 2016-06-01: qty 1

## 2016-06-01 MED ORDER — HYDROCODONE-ACETAMINOPHEN 5-325 MG PO TABS
1.0000 | ORAL_TABLET | Freq: Four times a day (QID) | ORAL | Status: DC | PRN
  Filled 2016-06-01: qty 1

## 2016-06-01 MED ORDER — IBUPROFEN 600 MG PO TABS
ORAL_TABLET | ORAL | Status: AC
  Filled 2016-06-01: qty 1

## 2016-06-01 MED ORDER — LOPERAMIDE HCL 2 MG PO CAPS
2.0000 mg | ORAL_CAPSULE | ORAL | Status: DC | PRN
  Administered 2016-06-02 – 2016-06-03 (×2): 2 mg via ORAL
  Filled 2016-06-01 (×2): qty 1

## 2016-06-01 MED ORDER — ONDANSETRON HCL 4 MG PO TABS: 4.0000 mg | ORAL_TABLET | Freq: Four times a day (QID) | ORAL | Status: DC | PRN

## 2016-06-01 MED ORDER — TEMAZEPAM 7.5 MG PO CAPS
7.5000 mg | ORAL_CAPSULE | Freq: Every day | ORAL | Status: DC
  Administered 2016-06-01 – 2016-06-02 (×2): 7.5 mg via ORAL
  Filled 2016-06-01 (×2): qty 1

## 2016-06-01 MED ORDER — OSELTAMIVIR PHOSPHATE 75 MG PO CAPS
75.0000 mg | ORAL_CAPSULE | Freq: Two times a day (BID) | ORAL | Status: DC
Start: 2016-06-01 — End: 2016-06-02

## 2016-06-01 MED ORDER — WARFARIN SODIUM 2.5 MG PO TABS
2.5000 mg | ORAL_TABLET | Freq: Every day | ORAL | Status: DC
  Administered 2016-06-01: 2.5 mg via ORAL
  Filled 2016-06-01: qty 1

## 2016-06-01 MED ORDER — SODIUM CHLORIDE 0.9 % IV SOLN
INTRAVENOUS | Status: DC
  Administered 2016-06-01 – 2016-06-02 (×2): via INTRAVENOUS

## 2016-06-01 MED ORDER — ASPIRIN EC 81 MG PO TBEC
81.0000 mg | DELAYED_RELEASE_TABLET | Freq: Every day | ORAL | Status: DC
  Administered 2016-06-02 – 2016-06-03 (×2): 81 mg via ORAL
  Filled 2016-06-01 (×2): qty 1

## 2016-06-01 MED ORDER — CEFTRIAXONE SODIUM-DEXTROSE 1-3.74 GM-% IV SOLR
1.0000 g | INTRAVENOUS | Status: DC
  Administered 2016-06-02: 1 g via INTRAVENOUS
  Filled 2016-06-01: qty 50

## 2016-06-01 MED ORDER — WARFARIN SODIUM 2.5 MG PO TABS
2.5000 mg | ORAL_TABLET | Freq: Every day | ORAL | Status: DC
Start: 2016-06-01 — End: 2016-06-01

## 2016-06-01 MED ORDER — VITAMIN B-12 1000 MCG PO TABS
1000.0000 ug | ORAL_TABLET | Freq: Every day | ORAL | Status: DC
  Administered 2016-06-02 – 2016-06-03 (×2): 1000 ug via ORAL
  Filled 2016-06-01 (×2): qty 1

## 2016-06-01 MED ORDER — CEFTRIAXONE SODIUM-DEXTROSE 1-3.74 GM-% IV SOLR
1.0000 g | Freq: Once | INTRAVENOUS | Status: AC
  Administered 2016-06-01: 1 g via INTRAVENOUS
  Filled 2016-06-01: qty 50

## 2016-06-01 NOTE — ED Triage Notes (Signed)
Pt from [facility] with reports that she has been having flu like sx's since Friday with nausea and weakness. Pts SBP was in the 60's at [facility] per nurse.

## 2016-06-01 NOTE — Progress Notes (Signed)
Family Meeting Note  Advance Directive:yes  Today a meeting took place with the Patient MD     The following clinical team members were present during this meeting:MD  The following were discussed:Patient's diagnosis: , Patient's progosis: Unable to determine and diagnosis of  sepsis and plan of care discussed with the patient  Goals for treatment: DNR, DOESN'T HAVE HCPOA   Additional follow-up to be provided: HOSPITALIST  Time spent during discussion:18 MIN  Ramonita Lab, MD

## 2016-06-01 NOTE — H&P (Signed)
[facility] [place] [facility]   PATIENT NAME: Joanna Roberts    MR#:  [id number]  DATE OF BIRTH:  [birth year]  DATE OF ADMISSION:  06/01/2016  PRIMARY CARE PHYSICIAN: Patrice Paradise, MD   REQUESTING/REFERRING PHYSICIAN: Emily Filbert, MD  CHIEF COMPLAINT:   Generalized weakness, fever, diarrhea for 2 months HISTORY OF PRESENT ILLNESS:  Joanna Roberts  is a 83 y.o. female with a known history of Chronic CHF, hypertension, hypothyroidism, chronic atrial fibrillation on Coumadin and INR at 2.0 and multiple other medical problems is presenting to the ED with a chief complaint of generalized weakness associated with flulike symptoms. Reporting nausea but denies any vomiting. Blood pressure was reportedly low when she [facility]. Patient is also reporting loose watery bowel movements for the past 2 months which are not getting better. Not evaluated by any gastroenterologist. In the ED she is febrile and hypotensive. Influenza A is positive. UA is abnormal  PAST MEDICAL HISTORY:   Past Medical History:  Diagnosis Date  . AICD (automatic cardioverter/defibrillator) present   . Allergy   . Anemia   . Arthritis   . Cataract   . CHF (congestive heart failure) (HCC)   . Depression   . Dysrhythmia   . GERD (gastroesophageal reflux disease)   . Heart murmur   . Hypertension   . Myocardial infarction   . Osteoporosis   . Presence of permanent cardiac pacemaker   . Thyroid disease     PAST SURGICAL HISTOIRY:   Past Surgical History:  Procedure Laterality Date  . ABDOMINAL HYSTERECTOMY    . APPENDECTOMY    . HERNIA REPAIR    . INSERT / REPLACE / REMOVE PACEMAKER    . PERIPHERAL VASCULAR CATHETERIZATION Right 10/08/2014   Procedure: Lower Extremity Angiography;  Surgeon: Renford Dills, MD;  Location: [facility] INVASIVE CV LAB;  Service: Cardiovascular;  Laterality: Right;  . PERIPHERAL VASCULAR CATHETERIZATION  10/08/2014   Procedure: Lower  Extremity Intervention;  Surgeon: Renford Dills, MD;  Location: [facility] INVASIVE CV LAB;  Service: Cardiovascular;;  . PERIPHERAL VASCULAR CATHETERIZATION N/A 03/23/2016   Procedure: Visceral Angiography;  Surgeon: Renford Dills, MD;  Location: [facility] INVASIVE CV LAB;  Service: Cardiovascular;  Laterality: N/A;    SOCIAL HISTORY:   Social History  Substance Use Topics  . Smoking status: Never Smoker  . Smokeless tobacco: Never Used  . Alcohol use No    FAMILY HISTORY:   Family History  Problem Relation Age of Onset  . Early death Mother   . Heart disease Mother   . Arthritis Father   . Asthma Father   . Stroke Sister     DRUG ALLERGIES:   Allergies  Allergen Reactions  . Lortab [Hydrocodone-Acetaminophen] Itching, Swelling and Rash  . Doxycycline Diarrhea  . Macrobid [Nitrofurantoin Monohyd Macro] Diarrhea and Nausea And Vomiting  . Nitrofurantoin Diarrhea, Nausea Only and Nausea And Vomiting    REVIEW OF SYSTEMS:  CONSTITUTIONAL:Patient has fever, fatigue and weakness.  EYES: No blurred or double vision.  EARS, NOSE, AND THROAT: No tinnitus or ear pain.  RESPIRATORY: Reporting cough, denies shortness of breath, wheezing or hemoptysis.  CARDIOVASCULAR: No chest pain, orthopnea, edema.  GASTROINTESTINAL: Patient has nausea. Denies any vomiting. Has chronic diarrhea for 2 months. Denies any blood in her stool. Denies any abdominal pain URINE: No polyuria, nocturia,  HEMATOLOGY: No anemia, easy bruising or bleeding SKIN: No rash or lesion. MUSCULOSKELETAL: No joint pain or  arthritis.   NEUROLOGIC: No tingling, numbness, weakness.  PSYCHIATRY: No anxiety or depression.   MEDICATIONS AT HOME:   Prior to Admission medications   Medication Sig Start Date End Date Taking? Authorizing Provider  amoxicillin-clavulanate (AUGMENTIN) 500-125 MG tablet Take 1 tablet by mouth 2 (two) times daily. Take 1 tablet by mouth twice daily for 7 days. 05/31/16 06/07/16 Yes Historical  Provider, MD  carvedilol (COREG) 25 MG tablet TAKE ONE TABLET BY MOUTH TWICE DAILY WITH  MEALS 06/26/15  Yes Historical Provider, MD  diphenoxylate-atropine (LOMOTIL) 2.5-0.025 MG tablet Take 1-2 tablets by mouth 4 (four) times daily as needed for diarrhea or loose stools. 04/08/16  Yes Renford Dills, MD  docusate sodium (COLACE) 100 MG capsule Take 1 capsule (100 mg total) by mouth 2 (two) times daily as needed (take to keep stool soft.). 09/17/15  Yes Vanna Scotland, MD  fluconazole (DIFLUCAN) 150 MG tablet  02/18/16  Yes Historical Provider, MD  furosemide (LASIX) 20 MG tablet Take 1 tablet (20 mg total) by mouth daily. Patient taking differently: Take 20 mg by mouth daily as needed for fluid.  03/18/15  Yes Srikar Sudini, MD  gabapentin (NEURONTIN) 300 MG capsule Take 300 mg by mouth at bedtime.  03/16/16  Yes Historical Provider, MD  HYDROcodone-acetaminophen (NORCO/VICODIN) 5-325 MG tablet Limit one half to one tab by mouth per day or 2-3 times per day if tolerated 12/09/15  Yes Ewing Schlein, MD  levothyroxine (SYNTHROID, LEVOTHROID) 25 MCG tablet Take 25 mcg by mouth daily before breakfast.   Yes Historical Provider, MD  loperamide (ANTI-DIARRHEAL) 2 MG capsule Take 2 mg by mouth as needed for diarrhea or loose stools.   Yes Historical Provider, MD  losartan (COZAAR) 25 MG tablet TAKE ONE TABLET EVERY DAY 07/30/15  Yes Historical Provider, MD  nitroGLYCERIN (NITROSTAT) 0.4 MG SL tablet Place 0.4 mg under the tongue every 5 (five) minutes as needed for chest pain.    Yes Historical Provider, MD  omeprazole (PRILOSEC) 20 MG capsule  04/02/16  Yes Historical Provider, MD  ondansetron (ZOFRAN) 4 MG tablet  03/30/16  Yes Historical Provider, MD  temazepam (RESTORIL) 7.5 MG capsule Take 7.5 mg by mouth at bedtime.  03/17/16  Yes Historical Provider, MD  vitamin B-12 (CYANOCOBALAMIN) 1000 MCG tablet Take 1,000 mcg by mouth daily.   Yes Historical Provider, MD  warfarin (COUMADIN) 2.5 MG tablet Take 2.5  mg by mouth daily.    Yes Historical Provider, MD  aspirin EC 81 MG EC tablet Take 1 tablet (81 mg total) by mouth daily. Patient not taking: Reported on 06/01/2016 03/25/16   Renford Dills, MD      VITAL SIGNS:  Blood pressure 100/68, pulse 89, temperature (!) 101.6 F (38.7 C), temperature source Oral, resp. rate 15, height 5\' 4"  (1.626 m), weight 60.3 kg (133 lb), SpO2 94 %.  PHYSICAL EXAMINATION:  GENERAL:  83 y.o.-year-old patient lying in the bed with no acute distress.  EYES: Pupils equal, round, reactive to light and accommodation. No scleral icterus. Extraocular muscles intact.  HEENT: Head atraumatic, normocephalic. Nares are congested. Positive postnasal drip NECK:  Supple, no jugular venous distention. No thyroid enlargement, no tenderness.  LUNGS: Mod breath sounds bilaterally, no wheezing, rales,rhonchi or crepitation. No use of accessory muscles of respiration.  CARDIOVASCULAR: S1, S2 normal. No murmurs, rubs, or gallops.  ABDOMEN: Soft, nontender, nondistended. Bowel sounds present. No organomegaly or mass.  EXTREMITIES: No pedal edema, cyanosis, or clubbing.  NEUROLOGIC: Cranial nerves II  through XII are intact. Muscle strength 5/5 in all extremities. Sensation intact. Gait not checked.  PSYCHIATRIC: The patient is alert and oriented x 3.  SKIN: No obvious rash, lesion, or ulcer.   LABORATORY PANEL:   CBC  Recent Labs Lab 06/01/16 1038  WBC 4.4  HGB 10.3*  HCT 30.6*  PLT 131*   ------------------------------------------------------------------------------------------------------------------  Chemistries   Recent Labs Lab 06/01/16 1038  NA 133*  K 3.6  CL 102  CO2 23  GLUCOSE 114*  BUN 22*  CREATININE 1.37*  CALCIUM 8.1*  AST 32  ALT 15  ALKPHOS 33*  BILITOT 1.3*   ------------------------------------------------------------------------------------------------------------------  Cardiac Enzymes No results for input(s): TROPONINI in the last  168 hours. ------------------------------------------------------------------------------------------------------------------  RADIOLOGY:  Dg Chest Port 1 View  Result Date: 06/01/2016 CLINICAL DATA:  Flu symptoms.  Fever, cough, congestion. EXAM: PORTABLE CHEST 1 VIEW COMPARISON:  03/11/2015 FINDINGS: Moderate enlargement of the cardiopericardial silhouette with prominent central pulmonary vasculature as before. Reverse lordotic projection. AICD noted. Suspected biapical pleuroparenchymal scarring similar to prior. The left lung base obscured by the pulse generator. No obvious airspace opacity or edema.  Bony demineralization. IMPRESSION: 1. Moderate enlargement of the cardiopericardial silhouette, without edema. Visualized lungs appear clear. 2. Prominent central pulmonary artery suggesting pulmonary arterial hypertension. 3. Thoracic kyphosis.  Bony demineralization. Electronically Signed   By: Gaylyn Rong M.D.   On: 06/01/2016 11:05    EKG:   Orders placed or performed during the hospital encounter of 06/01/16  . EKG 12-Lead  . EKG 12-Lead    IMPRESSION AND PLAN:   Joanna Roberts  is a 83 y.o. female with a known history of Chronic CHF, hypertension, hypothyroidism, chronic atrial fibrillation on Coumadin and INR at 2.0 and multiple other medical problems is presenting to the ED with a chief complaint of generalized weakness associated with flulike symptoms. Reporting nausea but denies any vomiting. Blood pressure was reportedly low when [facility]. Patient is also reporting loose watery bowel movements for the past 2 months  # sepsis secondary to flu A and possible UTI Patient meets septic criteria with fever, hypotension Admit to MedSurg unit IV fluids Urine culture and sensitivity was obtained in the emergency department IV Rocephin and Tamiflu  # Influenza A-Tamiflu Nebuliser treatments as needed   #UTI with abnormal urinalysis   urine culture and sensitivities  ordered   IV Rocephin and IV fluids  #Chronic diarrhea for 2 months We will get stool for C. difficile toxin and GI panel Supportive treatment with IV fluids Enteric precautions GI consult is placed  #Chronic atrial fib  rate controlled Holding antihypertensive in view of hypotension  continue Coumadin, INR is therapeutic at 2.07    All the records are reviewed and case discussed with ED provider. Management plans discussed with the patient, family and they are in agreement.  CODE STATUS: dnr ,no hcpoa  TOTAL TIME TAKING CARE OF THIS PATIENT: .   Note: This dictation was prepared with Dragon dictation along with smaller phrase technology. Any transcriptional errors that result from this process are unintentional.  Ramonita Lab M.D on 06/01/2016 at 5:08 PM  Between 7am to 6pm - Pager - [phone]  After 6pm go to [email or web address] - password [email or web address] [facility]  [facility]  Office  [phone]  CC: Primary care physician; Patrice Paradise, MD

## 2016-06-01 NOTE — ED Notes (Addendum)
Pt to ed with c/o flu like symtpoms for several days.  Pt reports fever, cough, congestion and sob.  Pt skin hot to touch at this time. Pt alert and oriented at this time.  Pt also report nausea, and diarrhea.  Denies vomiting.  Pt placed on cm.  Pt is ventricular paced at this time.  Brought to ed from [place] [facility].  Pt with wrapping to bilat lower legs per dr sneer according to pt.  resp even and unlabored at this time.  Equal chest rise and fall.  Pt HR 77 paced.  Pt reports she is on coumadin for "valve issues and my pacemaker"

## 2016-06-01 NOTE — ED Provider Notes (Addendum)
[facility] Emergency Department Provider Note        Time seen: ----------------------------------------- 11:13 AM on 06/01/2016 -----------------------------------------    I have reviewed the triage vital signs and the nursing notes.   HISTORY  Chief Complaint Weakness; Nausea; and Fever    HPI Joanna Roberts is a 83 y.o. female who presents to the ER for flulike symptoms that began on Friday with nausea and weakness. Patient's blood pressure was reportedly low when she went to [facility]. She complains of fevers and chills as well as worsening cough. She wears bilateral Unna boot wraps. She denies specific body aches, abdominal pain, vomiting or diarrhea.   Past Medical History:  Diagnosis Date  . AICD (automatic cardioverter/defibrillator) present   . Allergy   . Anemia   . Arthritis   . Cataract   . CHF (congestive heart failure) (HCC)   . Depression   . Dysrhythmia   . GERD (gastroesophageal reflux disease)   . Heart murmur   . Hypertension   . Myocardial infarction   . Osteoporosis   . Presence of permanent cardiac pacemaker   . Thyroid disease     Patient Active Problem List   Diagnosis Date Noted  . Varicose veins of left leg with both ulcer and inflammation (HCC) 05/27/2016  . Acute blood loss anemia 04/01/2016  . Hematoma of groin 03/23/2016  . Chronic mesenteric ischemia (HCC) 03/15/2016  . Atherosclerosis of native arteries of extremity with intermittent claudication (HCC) 02/12/2016  . Chronic venous insufficiency 02/12/2016  . DDD (degenerative disc disease), lumbar 11/18/2015  . Facet syndrome, lumbar 11/18/2015  . Sacroiliac joint dysfunction 11/18/2015  . Spinal stenosis, lumbar region, with neurogenic claudication 11/18/2015  . Lumbar radiculopathy 11/18/2015  . Compression fracture of thoracic spine, non-traumatic (HCC) 11/18/2015  . Idiopathic scoliosis 11/18/2015  . H/O varicose vein stripping 11/18/2015  .  Acquired hypothyroidism 09/17/2015  . Atrial fibrillation (HCC) 09/17/2015  . Cardiomyopathy (HCC) 09/17/2015  . Back pain, chronic 09/17/2015  . DD (diverticular disease) 09/17/2015  . Aggrieved 09/17/2015  . 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 History of colon polyps 09/17/2015  . Personal history of urinary infection 09/17/2015  . Cannot sleep 09/17/2015  . Esophagitis, reflux 09/17/2015  . Tachycardia-bradycardia (HCC) 09/17/2015  . A-fib (HCC) 03/18/2015  . Pneumonia 03/11/2015  . B12 deficiency 11/27/2014  . Chronic anemia 11/26/2014  . Incomplete bladder emptying 06/24/2014  . Frequent UTI 06/24/2014  . History of urinary anomaly 06/21/2014  . FOM (frequency of micturition) 06/21/2014  . Vascular disorder of lower extremity 04/25/2014  . Chronic abdominal pain 01/17/2014  . CN (constipation) 01/17/2014  . Fatty infiltration of liver 01/17/2014  . Pain in rib 11/06/2013  . Temporary cerebral vascular dysfunction 11/06/2013    Past Surgical History:  Procedure Laterality Date  . ABDOMINAL HYSTERECTOMY    . APPENDECTOMY    . HERNIA REPAIR    . INSERT / REPLACE / REMOVE PACEMAKER    . PERIPHERAL VASCULAR CATHETERIZATION Right 10/08/2014   Procedure: Lower Extremity Angiography;  Surgeon: Renford Dills, MD;  Location: [facility] INVASIVE CV LAB;  Service: Cardiovascular;  Laterality: Right;  . PERIPHERAL VASCULAR CATHETERIZATION  10/08/2014   Procedure: Lower Extremity Intervention;  Surgeon: Renford Dills, MD;  Location: [facility] INVASIVE CV LAB;  Service: Cardiovascular;;  . PERIPHERAL VASCULAR CATHETERIZATION N/A 03/23/2016   Procedure: Visceral Angiography;  Surgeon: Renford Dills, MD;  Location: [facility] INVASIVE CV LAB;  Service: Cardiovascular;  Laterality: N/A;  Allergies Lortab [hydrocodone-acetaminophen]; Doxycycline; Macrobid [nitrofurantoin monohyd macro]; and Nitrofurantoin  Social History Social History  Substance Use Topics  . Smoking status: Never Smoker   . Smokeless tobacco: Never Used  . Alcohol use No    Review of Systems Constitutional: Positive for fever and chills Cardiovascular: Negative for chest pain. Respiratory: Positive for shortness of breath and cough Gastrointestinal: Negative for abdominal pain, vomiting and diarrhea. Genitourinary: Negative for dysuria. Musculoskeletal: Negative for back pain. Skin: Positive for bilateral leg erythema Neurological: Negative for headaches, focal weakness or numbness.  10-point ROS otherwise negative.  ____________________________________________   PHYSICAL EXAM:  VITAL SIGNS: ED Triage Vitals  Enc Vitals Group     BP 06/01/16 1028 (!) 85/51     Pulse Rate 06/01/16 1028 74     Resp 06/01/16 1028 (!) 21     Temp 06/01/16 1028 (!) 101.6 F (38.7 C)     Temp Source 06/01/16 1028 Oral     SpO2 06/01/16 1028 92 %     Weight 06/01/16 1028 133 lb (60.3 kg)     Height 06/01/16 1028 5\' 4"  (1.626 m)     Head Circumference --      Peak Flow --      Pain Score 06/01/16 1029 6     Pain Loc --      Pain Edu? --      Excl. in GC? --    Constitutional: Alert and oriented. Well appearing and in no distress. Eyes: Conjunctivae are normal. PERRL. Normal extraocular movements. ENT   Head: Normocephalic and atraumatic.   Nose: No congestion/rhinnorhea.   Mouth/Throat: Mucous membranes are moist.   Neck: No stridor. Cardiovascular: Normal rate, regular rhythm. No murmurs, rubs, or gallops. Respiratory: Normal respiratory effort without tachypnea nor retractions. Rhonchi is noted bilaterally Gastrointestinal: Soft and nontender. Normal bowel sounds Musculoskeletal: Nontender with normal range of motion in all extremities. No lower extremity tenderness nor edema. Neurologic:  Normal speech and language. No gross focal neurologic deficits are appreciated.  Skin:  Skin is warm, dry and intact. No rash noted. Psychiatric: Mood and affect are normal. Speech and behavior are  normal.  ____________________________________________  EKG: Interpreted by me. Electronic ventricular pacemaker is noted with a rate of 74 bpm.  ____________________________________________  ED COURSE:  Pertinent labs & imaging results that were available during my care of the patient were reviewed by me and considered in my medical decision making (see chart for details). Patient presents to the ER in no distress but noted to be hypotensive. We will assess with labs and initiate sepsis protocol. Possible influenza.   Procedures ____________________________________________   LABS (pertinent positives/negatives)  Labs Reviewed  URINALYSIS, COMPLETE (UACMP) WITH MICROSCOPIC - Abnormal; Notable for the following:       Result Value   Color, Urine YELLOW (*)    APPearance HAZY (*)    Hgb urine dipstick MODERATE (*)    Ketones, ur 5 (*)    Protein, ur 30 (*)    Leukocytes, UA LARGE (*)    Bacteria, UA RARE (*)    Squamous Epithelial / LPF 0-5 (*)    All other components within normal limits  CBC WITH DIFFERENTIAL/PLATELET - Abnormal; Notable for the following:    RBC 3.12 (*)    Hemoglobin 10.3 (*)    HCT 30.6 (*)    RDW 15.1 (*)    Platelets 131 (*)    Lymphs Abs 0.4 (*)    All other components within normal limits  COMPREHENSIVE METABOLIC PANEL - Abnormal; Notable for the following:    Sodium 133 (*)    Glucose, Bld 114 (*)    BUN 22 (*)    Creatinine, Ser 1.37 (*)    Calcium 8.1 (*)    Total Protein 6.1 (*)    Albumin 3.2 (*)    Alkaline Phosphatase 33 (*)    Total Bilirubin 1.3 (*)    GFR calc non Af Amer 34 (*)    GFR calc Af Amer 40 (*)    All other components within normal limits  PROTIME-INR - Abnormal; Notable for the following:    Prothrombin Time 23.6 (*)    All other components within normal limits  INFLUENZA PANEL BY PCR (TYPE A & B) - Abnormal; Notable for the following:    Influenza A By PCR POSITIVE (*)    All other components within normal limits   CULTURE, BLOOD (ROUTINE X 2)  CULTURE, BLOOD (ROUTINE X 2)  URINE CULTURE  LACTIC ACID, PLASMA  LACTIC ACID, PLASMA  CBC WITH DIFFERENTIAL/PLATELET    RADIOLOGY Images were viewed by me  Chest x-ray  IMPRESSION: 1. Moderate enlargement of the cardiopericardial silhouette, without edema. Visualized lungs appear clear. 2. Prominent central pulmonary artery suggesting pulmonary arterial hypertension. 3. Thoracic kyphosis. Bony demineralization. ____________________________________________  FINAL ASSESSMENT AND PLAN  Influenza, hypotension, UTI  Plan: Patient with labs and imaging as dictated above. There are no clear findings to suggest sepsis at this point but she does have evidence of UTI treated with Rocephin. She is hypotensive likely secondary to the UTI,  Possibly related to influenza as well. We have given her a liter of fluids and her blood pressure remains in the 80's systolic. She has reluctantly agreed to be admitted to the hospital at this time.   Emily Filbert, MD   Note: This note was generated in part or whole with voice recognition software. Voice recognition is usually quite accurate but there are transcription errors that can and very often do occur. I apologize for any typographical errors that were not detected and corrected.     Emily Filbert, MD 06/01/16 1520    Emily Filbert, MD 06/01/16 [phone]

## 2016-06-01 NOTE — Progress Notes (Signed)
ANTICOAGULATION CONSULT NOTE - Initial Consult  Pharmacy Consult for warfarin Indication: atrial fibrillation  Allergies  Allergen Reactions  . Lortab [Hydrocodone-Acetaminophen] Itching, Swelling and Rash  . Doxycycline Diarrhea  . Macrobid [Nitrofurantoin Monohyd Macro] Diarrhea and Nausea And Vomiting  . Nitrofurantoin Diarrhea, Nausea Only and Nausea And Vomiting   Patient Measurements: Height: 5\' 4"  (162.6 cm) Weight: 133 lb (60.3 kg) IBW/kg (Calculated) : 54.7  Vital Signs: Temp: 101.6 F (38.7 C) (02/06 1028) Temp Source: Oral (02/06 1028) BP: 100/68 (02/06 1630) Pulse Rate: 89 (02/06 1630)  Labs:  Recent Labs  06/01/16 1038  HGB 10.3*  HCT 30.6*  PLT 131*  LABPROT 23.6*  INR 2.07  CREATININE 1.37*    Estimated Creatinine Clearance: 25.9 mL/min (by C-G formula based on SCr of 1.37 mg/dL (H)).   Medical History: Past Medical History:  Diagnosis Date  . AICD (automatic cardioverter/defibrillator) present   . Allergy   . Anemia   . Arthritis   . Cataract   . CHF (congestive heart failure) (HCC)   . Depression   . Dysrhythmia   . GERD (gastroesophageal reflux disease)   . Heart murmur   . Hypertension   . Myocardial infarction   . Osteoporosis   . Presence of permanent cardiac pacemaker   . Thyroid disease     Assessment: 83 yo female being admitted with influenza and possible UTI. Patient has PMH of chronic A. Fib and takes warfarin at home. Pharmacy consulted for warfarin dosing and monitoring while inpatient.   Home Regimen: Warfarin 2.5mg  Daily   DATE  INR  DOSE 2/6  2.07  2.5mg   Goal of Therapy:  INR 2-3 Monitor platelets by anticoagulation protocol: Yes   Plan:  Will continue patients home dose of warfarin 2.5mg  daily. INRs daily while on Abx.  Pharmacy will continue to follow and adjust dose per consult.   85, PharmD, BCPS Clinical Pharmacist 06/01/2016 5:23 PM

## 2016-06-01 NOTE — ED Notes (Signed)
Admitting md at bedside

## 2016-06-02 ENCOUNTER — Encounter: Payer: Self-pay | Admitting: *Deleted

## 2016-06-02 ENCOUNTER — Ambulatory Visit: Payer: PPO | Admitting: Obstetrics and Gynecology

## 2016-06-02 LAB — GASTROINTESTINAL PANEL BY PCR, STOOL (REPLACES STOOL CULTURE)
ADENOVIRUS F40/41: NOT DETECTED
Astrovirus: NOT DETECTED
CAMPYLOBACTER SPECIES: NOT DETECTED
CRYPTOSPORIDIUM: NOT DETECTED
CYCLOSPORA CAYETANENSIS: NOT DETECTED
ENTAMOEBA HISTOLYTICA: NOT DETECTED
ENTEROPATHOGENIC E COLI (EPEC): NOT DETECTED
Enteroaggregative E coli (EAEC): NOT DETECTED
Enterotoxigenic E coli (ETEC): NOT DETECTED
GIARDIA LAMBLIA: NOT DETECTED
Norovirus GI/GII: NOT DETECTED
PLESIMONAS SHIGELLOIDES: NOT DETECTED
Rotavirus A: NOT DETECTED
Salmonella species: NOT DETECTED
Sapovirus (I, II, IV, and V): NOT DETECTED
Shiga like toxin producing E coli (STEC): NOT DETECTED
Shigella/Enteroinvasive E coli (EIEC): NOT DETECTED
VIBRIO CHOLERAE: NOT DETECTED
Vibrio species: NOT DETECTED
YERSINIA ENTEROCOLITICA: NOT DETECTED

## 2016-06-02 LAB — COMPREHENSIVE METABOLIC PANEL
ALBUMIN: 2.7 g/dL — AB (ref 3.5–5.0)
ALT: 15 U/L (ref 14–54)
AST: 28 U/L (ref 15–41)
Alkaline Phosphatase: 30 U/L — ABNORMAL LOW (ref 38–126)
Anion gap: 6 (ref 5–15)
BUN: 16 mg/dL (ref 6–20)
CHLORIDE: 110 mmol/L (ref 101–111)
CO2: 23 mmol/L (ref 22–32)
Calcium: 7.3 mg/dL — ABNORMAL LOW (ref 8.9–10.3)
Creatinine, Ser: 0.94 mg/dL (ref 0.44–1.00)
GFR calc Af Amer: >60 mL/min (ref >60)
GFR calc non Af Amer: 54 mL/min — ABNORMAL LOW (ref >60)
GLUCOSE: 96 mg/dL (ref 65–99)
POTASSIUM: 2.9 mmol/L — AB (ref 3.5–5.1)
Sodium: 139 mmol/L (ref 135–145)
Total Bilirubin: 0.5 mg/dL (ref 0.3–1.2)
Total Protein: 5.4 g/dL — ABNORMAL LOW (ref 6.5–8.1)

## 2016-06-02 LAB — CBC WITH DIFFERENTIAL/PLATELET
Basophils Absolute: 0 10*3/uL (ref 0–0.1)
Basophils Relative: 0 %
Eosinophils Absolute: 0 10*3/uL (ref 0–0.7)
Eosinophils Relative: 1 %
HCT: 29.7 % — ABNORMAL LOW (ref 35.0–47.0)
HEMOGLOBIN: 9.9 g/dL — AB (ref 12.0–16.0)
LYMPHS ABS: 0.8 10*3/uL — AB (ref 1.0–3.6)
LYMPHS PCT: 22 %
MCH: 32.8 pg (ref 26.0–34.0)
MCHC: 33.4 g/dL (ref 32.0–36.0)
MCV: 98.3 fL (ref 80.0–100.0)
MONOS PCT: 12 %
Monocytes Absolute: 0.4 10*3/uL (ref 0.2–0.9)
NEUTROS PCT: 65 %
Neutro Abs: 2.3 10*3/uL (ref 1.4–6.5)
Platelets: 98 10*3/uL — ABNORMAL LOW (ref 150–440)
RBC: 3.02 MIL/uL — AB (ref 3.80–5.20)
RDW: 15.2 % — ABNORMAL HIGH (ref 11.5–14.5)
WBC: 3.6 10*3/uL (ref 3.6–11.0)

## 2016-06-02 LAB — PROTIME-INR
INR: 2.64
Prothrombin Time: 28.7 seconds — ABNORMAL HIGH (ref 11.4–15.2)

## 2016-06-02 LAB — MRSA PCR SCREENING: MRSA by PCR: NEGATIVE

## 2016-06-02 LAB — C DIFFICILE QUICK SCREEN W PCR REFLEX
C DIFFICILE (CDIFF) TOXIN: NEGATIVE
C Diff antigen: NEGATIVE
C Diff interpretation: NOT DETECTED

## 2016-06-02 MED ORDER — OSELTAMIVIR PHOSPHATE 30 MG PO CAPS
30.0000 mg | ORAL_CAPSULE | Freq: Two times a day (BID) | ORAL | Status: DC
  Administered 2016-06-02 – 2016-06-03 (×3): 30 mg via ORAL
  Filled 2016-06-02 (×3): qty 1

## 2016-06-02 MED ORDER — SODIUM CHLORIDE 0.9 % IV SOLN: INTRAVENOUS | Status: DC

## 2016-06-02 MED ORDER — GUAIFENESIN-DM 100-10 MG/5ML PO SYRP
5.0000 mL | ORAL_SOLUTION | ORAL | Status: DC | PRN
  Administered 2016-06-02 (×2): 5 mL via ORAL
  Filled 2016-06-02 (×2): qty 5

## 2016-06-02 MED ORDER — CARVEDILOL 12.5 MG PO TABS
25.0000 mg | ORAL_TABLET | Freq: Two times a day (BID) | ORAL | Status: DC
  Administered 2016-06-02 – 2016-06-03 (×2): 25 mg via ORAL
  Filled 2016-06-02 (×2): qty 2

## 2016-06-02 MED ORDER — POTASSIUM CHLORIDE 20 MEQ PO PACK
40.0000 meq | PACK | Freq: Once | ORAL | Status: AC
  Administered 2016-06-02: 40 meq via ORAL
  Filled 2016-06-02: qty 2

## 2016-06-02 MED ORDER — POTASSIUM CHLORIDE CRYS ER 20 MEQ PO TBCR
40.0000 meq | EXTENDED_RELEASE_TABLET | Freq: Two times a day (BID) | ORAL | Status: AC
  Administered 2016-06-02 (×2): 40 meq via ORAL
  Filled 2016-06-02 (×2): qty 2

## 2016-06-02 NOTE — Consult Note (Signed)
CH paged on request of patient to visit; CH and patient talked about family and her late husband of 47yrs; estranged daughter in [place]; minimal local support; CH offered grief, social and spiritual support along with prayer.  CH recommends follow-up visits.

## 2016-06-02 NOTE — Progress Notes (Signed)
[facility] - [place] at [facility]   PATIENT NAME: Joanna Roberts    MR#:  [id number]  DATE OF BIRTH:  1928/06/24  SUBJECTIVE:  Came in with weakness and found to have Flu  REVIEW OF SYSTEMS:   Review of Systems  Constitutional: Negative for chills, fever and weight loss.  HENT: Negative for ear discharge, ear pain and nosebleeds.   Eyes: Negative for blurred vision, pain and discharge.  Respiratory: Negative for sputum production, shortness of breath, wheezing and stridor.   Cardiovascular: Negative for chest pain, palpitations, orthopnea and PND.  Gastrointestinal: Negative for abdominal pain, diarrhea, nausea and vomiting.  Genitourinary: Negative for frequency and urgency.  Musculoskeletal: Negative for back pain and joint pain.  Neurological: Negative for sensory change, speech change, focal weakness and weakness.  Psychiatric/Behavioral: Negative for depression and hallucinations. The patient is not nervous/anxious.    Tolerating Diet:yes Tolerating PT: pending  DRUG ALLERGIES:   Allergies  Allergen Reactions  . Lortab [Hydrocodone-Acetaminophen] Itching, Swelling and Rash  . Doxycycline Diarrhea  . Macrobid [Nitrofurantoin Monohyd Macro] Diarrhea and Nausea And Vomiting  . Nitrofurantoin Diarrhea, Nausea Only and Nausea And Vomiting    VITALS:  Blood pressure 112/81, pulse 80, temperature 98.6 F (37 C), temperature source Oral, resp. rate 18, height 5\' 4"  (1.626 m), weight 60.3 kg (133 lb), SpO2 99 %.  PHYSICAL EXAMINATION:   Physical Exam  GENERAL:  83 y.o.-year-old patient lying in the bed with no acute distress.  EYES: Pupils equal, round, reactive to light and accommodation. No scleral icterus. Extraocular muscles intact.  HEENT: Head atraumatic, normocephalic. Oropharynx and nasopharynx clear.  NECK:  Supple, no jugular venous distention. No thyroid enlargement, no tenderness.  LUNGS: Normal breath sounds bilaterally, no wheezing, few  rales, no rhonchi. No use of accessory muscles of respiration.  CARDIOVASCULAR: S1, S2 normal. No murmurs, rubs, or gallops.  ABDOMEN: Soft, nontender, nondistended. Bowel sounds present. No organomegaly or mass.  EXTREMITIES: No cyanosis, clubbing or edema b/l.    NEUROLOGIC: Cranial nerves II through XII are intact. No focal Motor or sensory deficits b/l.   PSYCHIATRIC:  patient is alert and oriented x 3.  SKIN: No obvious rash, lesion, or ulcer.   LABORATORY PANEL:  CBC  Recent Labs Lab 06/02/16 0553  WBC 3.6  HGB 9.9*  HCT 29.7*  PLT 98*    Chemistries   Recent Labs Lab 06/02/16 0553  NA 139  K 2.9*  CL 110  CO2 23  GLUCOSE 96  BUN 16  CREATININE 0.94  CALCIUM 7.3*  AST 28  ALT 15  ALKPHOS 30*  BILITOT 0.5   Cardiac Enzymes No results for input(s): TROPONINI in the last 168 hours. RADIOLOGY:  Dg Chest Port 1 View  Result Date: 06/01/2016 CLINICAL DATA:  Flu symptoms.  Fever, cough, congestion. EXAM: PORTABLE CHEST 1 VIEW COMPARISON:  03/11/2015 FINDINGS: Moderate enlargement of the cardiopericardial silhouette with prominent central pulmonary vasculature as before. Reverse lordotic projection. AICD noted. Suspected biapical pleuroparenchymal scarring similar to prior. The left lung base obscured by the pulse generator. No obvious airspace opacity or edema.  Bony demineralization. IMPRESSION: 1. Moderate enlargement of the cardiopericardial silhouette, without edema. Visualized lungs appear clear. 2. Prominent central pulmonary artery suggesting pulmonary arterial hypertension. 3. Thoracic kyphosis.  Bony demineralization. Electronically Signed   By: 03/13/2015 M.D.   On: 06/01/2016 11:05   ASSESSMENT AND PLAN:   83yo female with known history of chronic CHF, hypertension, hypothyroidism, GERD, osteoporosis, chronic  afib on Coumadin and INR at 2.64 and numerous other medical problems. Presented to the ED with generalized weakness and flu-like symptoms. Also  admits to loose watery stools for the past 2 months.   1) Sepsis secondary to Influenza A and urinary tract infection - Continue Tamiflu and IV Rochepin   2) Influenza A  - Continue supportive treatment and tamiflu - Nebulizer treatments as needed   3) UTI with abnormal urinalysis  - urine culture in process, continue rochepin  4) Chronic CHF - Administer Lasix 20mg  IV for one dose  5) Chronic diarrhea for 2 months - negative for C. Diff and GI panel unremarkable - cancel GI consult   6) Chronic atrial fibrillation - rate controlled - Holding antihypertensive due to hypotension - Continue Coumadin, INR is therapeutic at 2.64  PT to see pt Case discussed with Care Management/Social Worker. Management plans discussed with the patient, family and they are in agreement.  CODE STATUS: full DVT Prophylaxis: coumadin  TOTAL TIME TAKING CARE OF THIS PATIENT: 30 minutes.  >50% time spent on counselling and coordination of care  POSSIBLE D/C IN 1-2 DAYS, DEPENDING ON CLINICAL CONDITION.  Note: This dictation was prepared with Dragon dictation along with smaller phrase technology. Any transcriptional errors that result from this process are unintentional.  Devon Kingdon M.D on 06/02/2016 at 1:52 PM  Between 7am to 6pm - Pager - [phone]  After 6pm go to [email or web address] - [birth year]-1997  [facility]  Office  [phone]  CC: Primary care physician; [phone], MD

## 2016-06-02 NOTE — Progress Notes (Signed)
Dr patel informed of patient getting IVF and is a CHF patient.  She does seen to be wheezing more per night shift nurse.  Order also received for potassium and wound consult

## 2016-06-02 NOTE — Progress Notes (Signed)
ANTICOAGULATION CONSULT NOTE - Initial Consult  Pharmacy Consult for warfarin Indication: atrial fibrillation      Allergies  Allergen Reactions  . Lortab [Hydrocodone-Acetaminophen] Itching, Swelling and Rash  . Doxycycline Diarrhea  . Macrobid [Nitrofurantoin Monohyd Macro] Diarrhea and Nausea And Vomiting  . Nitrofurantoin Diarrhea, Nausea Only and Nausea And Vomiting   Patient Measurements: Height: 5\' 4"  (162.6 cm) Weight: 133 lb (60.3 kg) IBW/kg (Calculated) : 54.7  Vital Signs: Temp: 101.6 F (38.7 C) (02/06 1028) Temp Source: Oral (02/06 1028) BP: 100/68 (02/06 1630) Pulse Rate: 89 (02/06 1630)  Labs:  Recent Labs (last 2 labs)    Recent Labs  06/01/16 1038  HGB 10.3*  HCT 30.6*  PLT 131*  LABPROT 23.6*  INR 2.07  CREATININE 1.37*      Estimated Creatinine Clearance: 25.9 mL/min (by C-G formula based on SCr of 1.37 mg/dL (H)).   Medical History:     Past Medical History:  Diagnosis Date  . AICD (automatic cardioverter/defibrillator) present   . Allergy   . Anemia   . Arthritis   . Cataract   . CHF (congestive heart failure) (HCC)   . Depression   . Dysrhythmia   . GERD (gastroesophageal reflux disease)   . Heart murmur   . Hypertension   . Myocardial infarction   . Osteoporosis   . Presence of permanent cardiac pacemaker   . Thyroid disease     Assessment: 83 yo female being admitted with influenza and possible UTI. Patient has PMH of chronic A. Fib and takes warfarin at home. Pharmacy consulted for warfarin dosing and monitoring while inpatient.   Home Regimen: Warfarin 2.5mg  Daily   DATE              INR                  DOSE 2/6                   2.07                 2.5mg  2/7     2.67  Goal of Therapy:  INR 2-3 Monitor platelets by anticoagulation protocol: Yes   Plan:  Will continue patients home dose of warfarin 2.5mg  daily. INRs daily while on Abx.   2/7 Pt's INR jumped from 2.07 to 2.67 --  expect the INR to continue rising -- will hold a dose today and recheck INR 2/8 w/ am labs, pt is on abx and is acutely ill.  Thank you for this consult.  4/8, PharmD, BCPS Clinical Pharmacist 06/02/2016

## 2016-06-02 NOTE — Consult Note (Signed)
WOC Nurse wound consult note Reason for Consult: Compression.  Requested WOC nurse to remove.  Wants to bathe without them in place.  Left medial lower leg with a 0.5 cm scabbed lesion.  No edema noted today.  Will reapply Unnas boots tomorrow to keep her on schedule.  Changed in Dr. Eustace Quail office.   Wound type:Chronic venous insufficiency.  Weekly Unna boots on Thursday.  Removed per patient request today and will reapply tomorrow.  Moisture retentive dressing to wound on left lower leg.  Pressure Injury POA: N/A Measurement:0.5 cm x 0.5 cm scabbed lesion left medial lower leg Wound IHW:TUUE Drainage (amount, consistency, odor) Minimal serosanguinous drainage.  No odor.   Periwound: Bruising noted.  Abrasions.   Dressing procedure/placement/frequency:Cleanse bilateral lower legs with soap and water.  Apply silicone border foam dressing to left lower leg wound.  WIll apply zinc layer secured with self adherent Coban on Thursday for compression.  Will change weekly.  WOC team will follow.   Maple Hudson RN BSN CWON Pager [phone]

## 2016-06-02 NOTE — Progress Notes (Signed)
Initial Nutrition Assessment  DOCUMENTATION CODES:   Severe malnutrition in context of acute illness/injury  INTERVENTION:  - Snacks TID - Recommend liberalization of diet from heart healthy to regular to increase PO intake, pt may benefit from choosing foods from soft/bland diet until diarrhea improves  NUTRITION DIAGNOSIS:   Malnutrition related to acute illness as evidenced by percent weight loss (8% in <3 mo), moderate to severe fluid accumulation, moderate depletions of muscle mass.  GOAL:   Patient will meet greater than or equal to 90% of their needs  MONITOR:   PO intake, Weight trends, I & O's  REASON FOR ASSESSMENT:   Malnutrition Screening Tool    ASSESSMENT:  83 y.o. female with PMH of Chronic CHF, HTN, hypothyroidism, chronic A-fib, presenting to the ED with flu-like symptoms and weakness with sepsis due to UTI and Influenza A. Pt reported loose watery bowel movements for the past 2 months with no improvement.  Pt reports recent weight loss. Pt states UBW is ~145lb, and weighed this in November. Per chart review on 11/28 pt weighed 145 lbs. 8% wt loss in less than 3 months.  Pt reports N/V, diarrhea and weakness.  Pt reports recent decrease in appetite d/t diarrhea since October. Per chart review, pt completed 50-75% of meals while admitted.  Pt reports eating eggs and bites of grits for breakfast, baked potato, pinto beans and no chicken for lunch. Pt states her food is too bland and has no taste. Recommend liberalization of diet from heart healthy to regular to increase PO intake.  Pt states she resides at [facility] and that "their food is bland too". Pt reports she eats toast at breakfast, regarding lunch and dinner, pt reports "it depends" but did not elaborate further.  Pt states she used to drink nutritional supplements but feels they increase her diarrhea occurrence.   Per nutrition focused physical exam, pt showed mild/moderate muscle depletion, no fat  depletion and moderate edema.   Labs reviewed; K 2.9 (supplemented) Medications reviewed;  1000 mcg Vit B 12, 2 mg Imodium PRN  Diet Order:  Diet Heart Room service appropriate? Yes; Fluid consistency: Thin  Skin:  Reviewed, no issues  Last BM:  2/6  Height:   Ht Readings from Last 1 Encounters:  06/01/16 5\' 4"  (1.626 m)    Weight:   Wt Readings from Last 1 Encounters:  06/01/16 133 lb (60.3 kg)    Ideal Body Weight:  54.5 kg  BMI:  Body mass index is 22.83 kg/m.  Estimated Nutritional Needs:   Kcal:  1600-1800  Protein:  75-85  Fluid:  >/= 1.6 L/d  EDUCATION NEEDS:   Education needs no appropriate at this time  07/30/16 Intern

## 2016-06-02 NOTE — Progress Notes (Signed)
Patient feeling better but continues to be weak.  Up to bathroom with assistance

## 2016-06-02 NOTE — Progress Notes (Signed)
[facility] [place] [facility]   PATIENT NAME: Joanna Roberts    MR#:  [id number]  DATE OF BIRTH:  Sep 25, 1928  SUBJECTIVE:    Patient endorses generalized weakness due to flu. Denies N/V  REVIEW OF SYSTEMS:   ROS Tolerating Diet: Tolerating PT:   DRUG ALLERGIES:   Allergies  Allergen Reactions  . Lortab [Hydrocodone-Acetaminophen] Itching, Swelling and Rash  . Doxycycline Diarrhea  . Macrobid [Nitrofurantoin Monohyd Macro] Diarrhea and Nausea And Vomiting  . Nitrofurantoin Diarrhea, Nausea Only and Nausea And Vomiting    VITALS:  Blood pressure 112/81, pulse 80, temperature 98.6 F (37 C), temperature source Oral, resp. rate 18, height 5\' 4"  (1.626 m), weight 60.3 kg (133 lb), SpO2 99 %.  PHYSICAL EXAMINATION:   Physical Exam  GENERAL:  83 y.o.-year-old patient lying in the bed with no acute distress.  EYES: Pupils equal, round, reactive to light and accommodation. No scleral icterus. Extraocular muscles intact.  HEENT: Head atraumatic, normocephalic. Oropharynx and nasopharynx clear.  NECK:  Supple, no jugular venous distention. No thyroid enlargement, no tenderness.  LUNGS: Normal breath sounds bilaterally, no wheezing, rales, rhonchi. No use of accessory muscles of respiration.  CARDIOVASCULAR: S1, S2 normal. No murmurs, rubs, or gallops.  ABDOMEN: Soft, nontender, nondistended. Bowel sounds present. No organomegaly or mass.  EXTREMITIES: No cyanosis, clubbing or edema b/l.    NEUROLOGIC: Cranial nerves II through XII are intact. No focal Motor or sensory deficits b/l.   PSYCHIATRIC:  patient is alert and oriented x 3.  SKIN: No obvious rash, lesion, or ulcer.   LABORATORY PANEL:  CBC  Recent Labs Lab 06/02/16 0553  WBC 3.6  HGB 9.9*  HCT 29.7*  PLT 98*    Chemistries   Recent Labs Lab 06/02/16 0553  NA 139  K 2.9*  CL 110  CO2 23  GLUCOSE 96  BUN 16  CREATININE 0.94  CALCIUM 7.3*  AST 28  ALT 15  ALKPHOS 30*   BILITOT 0.5   Cardiac Enzymes No results for input(s): TROPONINI in the last 168 hours. RADIOLOGY:  Dg Chest Port 1 View  Result Date: 06/01/2016 CLINICAL DATA:  Flu symptoms.  Fever, cough, congestion. EXAM: PORTABLE CHEST 1 VIEW COMPARISON:  03/11/2015 FINDINGS: Moderate enlargement of the cardiopericardial silhouette with prominent central pulmonary vasculature as before. Reverse lordotic projection. AICD noted. Suspected biapical pleuroparenchymal scarring similar to prior. The left lung base obscured by the pulse generator. No obvious airspace opacity or edema.  Bony demineralization. IMPRESSION: 1. Moderate enlargement of the cardiopericardial silhouette, without edema. Visualized lungs appear clear. 2. Prominent central pulmonary artery suggesting pulmonary arterial hypertension. 3. Thoracic kyphosis.  Bony demineralization. Electronically Signed   By: 03/13/2015 M.D.   On: 06/01/2016 11:05   ASSESSMENT AND PLAN:   83yo female with known history of chronic CHF, hypertension, hypothyroidism, GERD, osteoporosis, chronic afib on Coumadin and INR at 2.64 and numerous other medical problems. Presented to the ED with generalized weakness and flu-like symptoms. Also admits to loose watery stools for the past 2 months.   1) Sepsis secondary to Influenza A and urinary tract infection - Continue Tamiflu and IV Rochepin   2) Influenza A  - Continue supportive treatment and tamiflu - Nebulizer treatments as needed   3) UTI with abnormal urinalysis  - urine culture in process, continue rochepin  4) Chronic CHF - Administer Lasix 20mg  IV for one dose  5) Chronic diarrhea for 2 months - negative for C. Diff  and GI panel unremarkable - cancel GI consult   6) Chronic atrial fibrillation - rate controlled - Holding antihypertensive due to hypotension - Continue Coumadin, INR is therapeutic at 2.64   Case discussed with Care Management/Social Worker. Management plans discussed with  the patient, family and they are in agreement.  CODE STATUS: DNR  DVT Prophylaxis: Coumadin  TOTAL TIME TAKING CARE OF THIS PATIENT: 20 minutes.  >50% time spent on counselling and coordination of care  POSSIBLE D/C IN 1 DAYS, DEPENDING ON CLINICAL CONDITION.    Bethena Midget,  Physician Assistant Student    This is a Consulting civil engineer note, solely for education purposes. This note was reviewed with and approved by attending preceptor.  CC: Primary care physician; Patrice Paradise, MD

## 2016-06-02 NOTE — Progress Notes (Signed)
Patient was asking about her cozaar and coreg.  I asked Dr Allena Katz if she wanted to restart these meds that were held on admission due to low BP.  Dr patel said to start coreg but not cozaar

## 2016-06-03 ENCOUNTER — Encounter (INDEPENDENT_AMBULATORY_CARE_PROVIDER_SITE_OTHER): Payer: PPO

## 2016-06-03 DIAGNOSIS — E43 Unspecified severe protein-calorie malnutrition: Secondary | ICD-10-CM | POA: Insufficient documentation

## 2016-06-03 LAB — PROTIME-INR
INR: 3.07
Prothrombin Time: 32.4 seconds — ABNORMAL HIGH (ref 11.4–15.2)

## 2016-06-03 LAB — URINE CULTURE: Culture: 20000 — AB

## 2016-06-03 MED ORDER — DIPHENHYDRAMINE HCL 25 MG PO CAPS
25.0000 mg | ORAL_CAPSULE | Freq: Every evening | ORAL | Status: DC | PRN
  Administered 2016-06-03: 25 mg via ORAL
  Filled 2016-06-03: qty 1

## 2016-06-03 MED ORDER — OSELTAMIVIR PHOSPHATE 30 MG PO CAPS: 30.0000 mg | ORAL_CAPSULE | Freq: Two times a day (BID) | ORAL | 0 refills | Status: DC

## 2016-06-03 MED ORDER — FUROSEMIDE 10 MG/ML IJ SOLN
20.0000 mg | Freq: Once | INTRAMUSCULAR | Status: AC
  Administered 2016-06-03: 20 mg via INTRAVENOUS
  Filled 2016-06-03: qty 2

## 2016-06-03 MED ORDER — CEPHALEXIN 500 MG PO CAPS: 500.0000 mg | ORAL_CAPSULE | Freq: Two times a day (BID) | ORAL | 0 refills | Status: DC

## 2016-06-03 MED ORDER — CEPHALEXIN 500 MG PO CAPS
500.0000 mg | ORAL_CAPSULE | Freq: Two times a day (BID) | ORAL | Status: DC
  Administered 2016-06-03: 500 mg via ORAL
  Filled 2016-06-03: qty 1

## 2016-06-03 MED ORDER — GUAIFENESIN-DM 100-10 MG/5ML PO SYRP: 5.0000 mL | ORAL_SOLUTION | ORAL | 0 refills | Status: DC | PRN

## 2016-06-03 NOTE — Progress Notes (Signed)
ANTICOAGULATION CONSULT NOTE - Initial Consult  Pharmacy Consult for warfarin Indication: atrial fibrillation      Allergies  Allergen Reactions  . Lortab [Hydrocodone-Acetaminophen] Itching, Swelling and Rash  . Doxycycline Diarrhea  . Macrobid [Nitrofurantoin Monohyd Macro] Diarrhea and Nausea And Vomiting  . Nitrofurantoin Diarrhea, Nausea Only and Nausea And Vomiting   Patient Measurements: Height: 5\' 4"  (162.6 cm) Weight: 133 lb (60.3 kg) IBW/kg (Calculated) : 54.7  Vital Signs: Temp: 101.6 F (38.7 C) (02/06 1028) Temp Source: Oral (02/06 1028) BP: 100/68 (02/06 1630) Pulse Rate: 89 (02/06 1630)  Labs:  Recent Labs (last 2 labs)    Recent Labs  06/01/16 1038  HGB 10.3*  HCT 30.6*  PLT 131*  LABPROT 23.6*  INR 2.07  CREATININE 1.37*      Estimated Creatinine Clearance: 25.9 mL/min (by C-G formula based on SCr of 1.37 mg/dL (H)).   Medical History:     Past Medical History:  Diagnosis Date  . AICD (automatic cardioverter/defibrillator) present   . Allergy   . Anemia   . Arthritis   . Cataract   . CHF (congestive heart failure) (HCC)   . Depression   . Dysrhythmia   . GERD (gastroesophageal reflux disease)   . Heart murmur   . Hypertension   . Myocardial infarction   . Osteoporosis   . Presence of permanent cardiac pacemaker   . Thyroid disease     Assessment: 83 yo female being admitted with influenza and possible UTI. Patient has PMH of chronic A. Fib and takes warfarin at home. Pharmacy consulted for warfarin dosing and monitoring while inpatient.   Home Regimen: Warfarin 2.5mg  Daily   DATEINRDOSE 2/62.072.5mg  2/7                    2.67 2/8       3.07  Goal of Therapy: INR 2-3 Monitor platelets by anticoagulation protocol: Yes  Plan: Will continue patients home dose of warfarin 2.5mg  daily. INRs daily while on Abx.   2/7  Pt's INR jumped from 2.07 to 2.67 -- expect the INR to continue rising -- will hold a dose today and recheck INR 2/8 w/ am labs, pt is on abx and is acutely ill.  2/8 INR has jumped again from 2.67 to 3.07 as expected, will continue to hold doses of warfarin until INR < 3.0 and will redose from there.  Thank you for this consult.  4/8, PharmD, BCPS Clinical Pharmacist 06/03/2016

## 2016-06-03 NOTE — Evaluation (Signed)
Physical Therapy Evaluation Patient Details Name: Joanna Roberts MRN: [id number] DOB: 21-Dec-1928 Today's Date: 06/03/2016   History of Present Illness  Joanna Roberts  is a 83 y.o. female with a known history of Chronic CHF, hypertension, hypothyroidism, chronic atrial fibrillation on Coumadin and INR at 2.0 and multiple other medical problems is presenting to the ED with a chief complaint of generalized weakness associated with flulike symptoms. Reporting nausea but denies any vomiting. Blood pressure was reportedly low when she [facility]. Patient is also reporting loose watery bowel movements for the past 2 months which are not getting better. Not evaluated by any gastroenterologist. In the ED she is febrile and hypotensive. Influenza A is positive. UA is abnormal  Clinical Impression  Pt admitted with above diagnosis. Pt currently with functional limitations due to the deficits listed below (see PT Problem List).  Pt demonstrates generalized deconditioning and weakness as well as difficulty with balance. All of these concerns are chronic and pt reports that her mobility is close to baseline. She is modified independent with bed mobility, supervision for transfers, and CGA for ambulation. She demonstrates slow but steady ambulation. She would benefit from [facility] PT after discharge for strengthening and balance to improve function and decrease her fall risk. Pt is considering HH PT and is not fully sure due to financial concerns. Advised her to discuss insurance coverage with care Production designer, theatre/television/film. Pt will benefit from skilled PT services to address deficits in strength, balance, and mobility in order to return to full function at home.     Follow Up Recommendations Home health PT    Equipment Recommendations  None recommended by PT    Recommendations for Other Services       Precautions / Restrictions Precautions Precautions: Fall Restrictions Weight Bearing Restrictions: No      Mobility  Bed  Mobility Overal bed mobility: Modified Independent Bed Mobility: Supine to Sit     Supine to sit: Modified independent (Device/Increase time)     General bed mobility comments: Pt requires increased time but is able to perform with HOB flat and no bed rails  Transfers Overall transfer level: Needs assistance Equipment used: Rolling walker (2 wheeled) Transfers: Sit to/from Stand Sit to Stand: Supervision         General transfer comment: Pt requires increased time but demonstrates safe hand placement during transfers. Good stability once upright with bilateral UE support on walker. Performed sit to stand transfers from commode and pt demonstrates good control when lowering herself down.  Ambulation/Gait Ambulation/Gait assistance: Min guard Ambulation Distance (Feet): 40 Feet Assistive device: Rolling walker (2 wheeled) Gait Pattern/deviations: Decreased step length - right;Decreased step length - left;Shuffle Gait velocity: Decreased Gait velocity interpretation: <1.8 ft/sec, indicative of risk for recurrent falls General Gait Details: Pt ambulates with short shuffling steps from bed to bathroom and back with rolling walker. Decreased gait speed but functional for limited community mobility. No DOE reported during ambulation however pt maintained on 2L/min O2 given drop in exertional sats as noted by RN. Pt demonstrates safe turning with walker  Stairs            Wheelchair Mobility    Modified Rankin (Stroke Patients Only)       Balance Overall balance assessment: Needs assistance Sitting-balance support: No upper extremity supported Sitting balance-Leahy Scale: Good     Standing balance support: No upper extremity supported Standing balance-Leahy Scale: Fair Standing balance comment: Able to maintain wide and narrow stance balance  without UE support. Negative Rhomberg. Pt unable to attempt single leg balance due to instability                              Pertinent Vitals/Pain Pain Assessment: No/denies pain    Home Living Family/patient expects to be discharged to:: Assisted living               Home Equipment: Walker - 4 wheels;Grab bars - toilet;Grab bars - tub/shower;Other (comment) (WI shower, no shower seat) Additional Comments: Lives in independent living senior care apartment called [facility].     Prior Function Level of Independence: Needs assistance   Gait / Transfers Assistance Needed: Ambulates full facility distances with rollator. Denies falls in the last 12 months  ADL's / Homemaking Assistance Needed: Has housecleaner and goes to dining [employer] for meals 3 times/day. Pt reports independence with ADLs. She wears a call alert bell in case of a fall        Hand Dominance   Dominant Hand: Right    Extremity/Trunk Assessment   Upper Extremity Assessment Upper Extremity Assessment: Generalized weakness    Lower Extremity Assessment Lower Extremity Assessment: Generalized weakness       Communication   Communication: No difficulties  Cognition Arousal/Alertness: Awake/alert Behavior During Therapy: WFL for tasks assessed/performed Overall Cognitive Status: Within Functional Limits for tasks assessed                      General Comments      Exercises     Assessment/Plan    PT Assessment Patient needs continued PT services  PT Problem List Decreased strength;Decreased activity tolerance;Decreased balance;Decreased mobility;Decreased safety awareness          PT Treatment Interventions DME instruction;Gait training;Functional mobility training;Therapeutic activities;Therapeutic exercise;Balance training;Neuromuscular re-education;Patient/family education    PT Goals (Current goals can be found in the Care Plan section)  Acute Rehab PT Goals Patient Stated Goal: Return to prior level of function at home PT Goal Formulation: With patient Time For Goal Achievement:  06/17/16 Potential to Achieve Goals: Good    Frequency Min 2X/week   Barriers to discharge Decreased caregiver support Lives alone but senior living apartment has a lot of safety features    Co-evaluation               End of Session Equipment Utilized During Treatment: Gait belt Activity Tolerance: Patient tolerated treatment well Patient left: in bed;with call bell/phone within reach;with bed alarm set           Time: 8185-6314 PT Time Calculation (min) (ACUTE ONLY): 21 min   Charges:   PT Evaluation $PT Eval Low Complexity: 1 Procedure     PT G Codes:       Sharalyn Ink Royston Bekele PT, DPT   Kele Barthelemy 06/03/2016, 1:24 PM

## 2016-06-03 NOTE — Care Management (Signed)
Notified by Barbara Cower from [facility] that patient declined home health services due to $25 co pay

## 2016-06-03 NOTE — Discharge Summary (Addendum)
[facility] - [place] [facility]   PATIENT NAME: Joanna Roberts    MR#:  [id number]  DATE OF BIRTH:  02-23-1929  DATE OF ADMISSION:  06/01/2016 ADMITTING PHYSICIAN: Ramonita Lab, MD  DATE OF DISCHARGE: 06/04/15  PRIMARY CARE PHYSICIAN: Patrice Paradise, MD    ADMISSION DIAGNOSIS:  Influenza A [J10.1] Hypotension, unspecified hypotension type [I95.9] Urinary tract infection without hematuria, site unspecified [N39.0] Cough with fever [R05, R50.9]  DISCHARGE DIAGNOSIS:  Influenza A UTI Gen weakness Chronic diarrhea Hypokalemia Acute on chronic mild systolic HF SECONDARY DIAGNOSIS:   Past Medical History:  Diagnosis Date  . AICD (automatic cardioverter/defibrillator) present   . Allergy   . Anemia   . Arthritis   . Cataract   . CHF (congestive heart failure) (HCC)   . Depression   . Dysrhythmia   . GERD (gastroesophageal reflux disease)   . Heart murmur   . Hypertension   . Myocardial infarction   . Osteoporosis   . Presence of permanent cardiac pacemaker   . Thyroid disease     HOSPITAL COURSE:  83yo female with known history of chronic CHF, hypertension, hypothyroidism, GERD, osteoporosis, chronic afib on Coumadin and INR at 2.64 and numerous other medical problems. Presented to the ED with generalized weakness and flu-like symptoms. Also admits to loose watery stools for the past 2 months.   1) Sepsis secondary to Influenza A and urinary tract infection - Continue Tamiflu and IV Rochepin ---change to po keflex -UC proteus 20K colonies -no fever sats 93-99% Wean off oxygen  2) Influenza A  - Continue supportive treatment and tamiflu - Nebulizer treatments as needed   3) UTI with abnormal urinalysis  - urine culture in process, continue rochepin  4)acute on  Chronic mild systolic CHF - Administer Lasix 20mg  IV for one dose x 2 days Pt has m=some wheezing and sob. She de-sats into the 80's Assess for home oxygen -ECHO out pt  in 2016 shws EF 50% moderate MR and mild AS  5) Chronic diarrhea for 2 months - negative for C. Diff and GI panel unremarkable - will make appt for GI as out pt  6) Chronic atrial fibrillation - rate controlled - Continue Coumadin, INR is therapeutic at 2.64 -cont cardiac meds  PT to see pt today D/c home Pt will benefit from [facility] and PT She lives alone in her apt will set up oxygen at d/c  CONSULTS OBTAINED:    DRUG ALLERGIES:   Allergies  Allergen Reactions  . Lortab [Hydrocodone-Acetaminophen] Itching, Swelling and Rash  . Doxycycline Diarrhea  . Macrobid [Nitrofurantoin Monohyd Macro] Diarrhea and Nausea And Vomiting  . Nitrofurantoin Diarrhea, Nausea Only and Nausea And Vomiting    DISCHARGE MEDICATIONS:   Current Discharge Medication List    START taking these medications   Details  cephALEXin (KEFLEX) 500 MG capsule Take 1 capsule (500 mg total) by mouth every 12 (twelve) hours. Qty: 6 capsule, Refills: 0    guaiFENesin-dextromethorphan (ROBITUSSIN DM) 100-10 MG/5ML syrup Take 5 mLs by mouth every 4 (four) hours as needed for cough. Qty: 118 mL, Refills: 0    oseltamivir (TAMIFLU) 30 MG capsule Take 1 capsule (30 mg total) by mouth 2 (two) times daily. Qty: 8 capsule, Refills: 0      CONTINUE these medications which have NOT CHANGED   Details  carvedilol (COREG) 25 MG tablet TAKE ONE TABLET BY MOUTH TWICE DAILY WITH  MEALS    diphenoxylate-atropine (LOMOTIL) 2.5-0.025 MG tablet  Take 1-2 tablets by mouth 4 (four) times daily as needed for diarrhea or loose stools. Qty: 40 tablet, Refills: 0    docusate sodium (COLACE) 100 MG capsule Take 1 capsule (100 mg total) by mouth 2 (two) times daily as needed (take to keep stool soft.). Qty: 60 capsule, Refills: 0    furosemide (LASIX) 20 MG tablet Take 1 tablet (20 mg total) by mouth daily. Qty: 30 tablet, Refills: 0    gabapentin (NEURONTIN) 300 MG capsule Take 300 mg by mouth at bedtime.      HYDROcodone-acetaminophen (NORCO/VICODIN) 5-325 MG tablet Limit one half to one tab by mouth per day or 2-3 times per day if tolerated Qty: 80 tablet, Refills: 0    levothyroxine (SYNTHROID, LEVOTHROID) 25 MCG tablet Take 25 mcg by mouth daily before breakfast.    loperamide (ANTI-DIARRHEAL) 2 MG capsule Take 2 mg by mouth as needed for diarrhea or loose stools.    losartan (COZAAR) 25 MG tablet TAKE ONE TABLET EVERY DAY    nitroGLYCERIN (NITROSTAT) 0.4 MG SL tablet Place 0.4 mg under the tongue every 5 (five) minutes as needed for chest pain.     omeprazole (PRILOSEC) 20 MG capsule     ondansetron (ZOFRAN) 4 MG tablet     temazepam (RESTORIL) 7.5 MG capsule Take 7.5 mg by mouth at bedtime.     vitamin B-12 (CYANOCOBALAMIN) 1000 MCG tablet Take 1,000 mcg by mouth daily.    warfarin (COUMADIN) 2.5 MG tablet Take 2.5 mg by mouth daily.     aspirin EC 81 MG EC tablet Take 1 tablet (81 mg total) by mouth daily. Qty: 180 tablet, Refills: 2      STOP taking these medications     amoxicillin-clavulanate (AUGMENTIN) 500-125 MG tablet      fluconazole (DIFLUCAN) 150 MG tablet         If you experience worsening of your admission symptoms, develop shortness of breath, life threatening emergency, suicidal or homicidal thoughts you must seek medical attention immediately by calling 911 or calling your MD immediately  if symptoms less severe.  You Must read complete instructions/literature along with all the possible adverse reactions/side effects for all the Medicines you take and that have been prescribed to you. Take any new Medicines after you have completely understood and accept all the possible adverse reactions/side effects.   Please note  You were cared for by a hospitalist during your hospital stay. If you have any questions about your discharge medications or the care you received while you were in the hospital after you are discharged, you can call the unit and asked to speak  with the hospitalist on call if the hospitalist that took care of you is not available. Once you are discharged, your primary care physician will handle any further medical issues. Please note that NO REFILLS for any discharge medications will be authorized once you are discharged, as it is imperative that you return to your primary care physician (or establish a relationship with a primary care physician if you do not have one) for your aftercare needs so that they can reassess your need for medications and monitor your lab values. Today   SUBJECTIVE   Doing ok. Weak, wants to go home Chronic diarrhea  VITAL SIGNS:  Blood pressure 124/76, pulse 81, temperature 97.9 F (36.6 C), temperature source Oral, resp. rate (!) 24, height 5\' 4"  (1.626 m), weight 60.3 kg (133 lb), SpO2 94 %.  I/O:   Intake/Output Summary (Last  24 hours) at 06/03/16 0714 Last data filed at 06/03/16 0043  Gross per 24 hour  Intake            437.5 ml  Output              600 ml  Net           -162.5 ml    PHYSICAL EXAMINATION:  GENERAL:  83 y.o.-year-old patient lying in the bed with no acute distress.  EYES: Pupils equal, round, reactive to light and accommodation. No scleral icterus. Extraocular muscles intact.  HEENT: Head atraumatic, normocephalic. Oropharynx and nasopharynx clear.  NECK:  Supple, no jugular venous distention. No thyroid enlargement, no tenderness.  LUNGS: Normal breath sounds bilaterally, no wheezing, rales,rhonchi or crepitation. No use of accessory muscles of respiration.  CARDIOVASCULAR: S1, S2 normal. No murmurs, rubs, or gallops.  ABDOMEN: Soft, non-tender, non-distended. Bowel sounds present. No organomegaly or mass.  EXTREMITIES: No pedal edema, cyanosis, or clubbing.  NEUROLOGIC: Cranial nerves II through XII are intact. Muscle strength 5/5 in all extremities. Sensation intact. Gait not checked.  PSYCHIATRIC: The patient is alert and oriented x 3.  SKIN: No obvious rash, lesion, or  ulcer.   DATA REVIEW:   CBC   Recent Labs Lab 06/02/16 0553  WBC 3.6  HGB 9.9*  HCT 29.7*  PLT 98*    Chemistries   Recent Labs Lab 06/02/16 0553  NA 139  K 2.9*  CL 110  CO2 23  GLUCOSE 96  BUN 16  CREATININE 0.94  CALCIUM 7.3*  AST 28  ALT 15  ALKPHOS 30*  BILITOT 0.5    Microbiology Results   Recent Results (from the past 240 hour(s))  Culture, blood (Routine x 2)     Status: None (Preliminary result)   Collection Time: 06/01/16 10:38 AM  Result Value Ref Range Status   Specimen Description BLOOD L AC  Final   Special Requests BOTTLES DRAWN AEROBIC AND ANAEROBIC BCLV  Final   Culture NO GROWTH < 24 HOURS  Final   Report Status PENDING  Incomplete  Culture, blood (Routine x 2)     Status: None (Preliminary result)   Collection Time: 06/01/16 10:39 AM  Result Value Ref Range Status   Specimen Description BLOOD L WRIST  Final   Special Requests BOTTLES DRAWN AEROBIC AND ANAEROBIC BCAV  Final   Culture NO GROWTH < 24 HOURS  Final   Report Status PENDING  Incomplete  Urine culture     Status: Abnormal (Preliminary result)   Collection Time: 06/01/16  2:05 PM  Result Value Ref Range Status   Specimen Description URINE, RANDOM  Final   Special Requests NONE  Final   Culture 20,000 COLONIES/mL PROTEUS MIRABILIS (A)  Final   Report Status PENDING  Incomplete  MRSA PCR Screening     Status: None   Collection Time: 06/02/16  1:15 AM  Result Value Ref Range Status   MRSA by PCR NEGATIVE NEGATIVE Final    Comment:        The GeneXpert MRSA Assay (FDA approved for NASAL specimens only), is one component of a comprehensive MRSA colonization surveillance program. It is not intended to diagnose MRSA infection nor to guide or monitor treatment for MRSA infections.   C difficile quick scan w PCR reflex     Status: None   Collection Time: 06/02/16 10:19 AM  Result Value Ref Range Status   C Diff antigen NEGATIVE NEGATIVE Final   C Diff  toxin NEGATIVE  NEGATIVE Final   C Diff interpretation No C. difficile detected.  Final  Gastrointestinal Panel by PCR , Stool     Status: None   Collection Time: 06/02/16 10:19 AM  Result Value Ref Range Status   Campylobacter species NOT DETECTED NOT DETECTED Final   Plesimonas shigelloides NOT DETECTED NOT DETECTED Final   Salmonella species NOT DETECTED NOT DETECTED Final   Yersinia enterocolitica NOT DETECTED NOT DETECTED Final   Vibrio species NOT DETECTED NOT DETECTED Final   Vibrio cholerae NOT DETECTED NOT DETECTED Final   Enteroaggregative E coli (EAEC) NOT DETECTED NOT DETECTED Final   Enteropathogenic E coli (EPEC) NOT DETECTED NOT DETECTED Final   Enterotoxigenic E coli (ETEC) NOT DETECTED NOT DETECTED Final   Shiga like toxin producing E coli (STEC) NOT DETECTED NOT DETECTED Final   Shigella/Enteroinvasive E coli (EIEC) NOT DETECTED NOT DETECTED Final   Cryptosporidium NOT DETECTED NOT DETECTED Final   Cyclospora cayetanensis NOT DETECTED NOT DETECTED Final   Entamoeba histolytica NOT DETECTED NOT DETECTED Final   Giardia lamblia NOT DETECTED NOT DETECTED Final   Adenovirus F40/41 NOT DETECTED NOT DETECTED Final   Astrovirus NOT DETECTED NOT DETECTED Final   Norovirus GI/GII NOT DETECTED NOT DETECTED Final   Rotavirus A NOT DETECTED NOT DETECTED Final   Sapovirus (I, II, IV, and V) NOT DETECTED NOT DETECTED Final    RADIOLOGY:  Dg Chest Port 1 View  Result Date: 06/01/2016 CLINICAL DATA:  Flu symptoms.  Fever, cough, congestion. EXAM: PORTABLE CHEST 1 VIEW COMPARISON:  03/11/2015 FINDINGS: Moderate enlargement of the cardiopericardial silhouette with prominent central pulmonary vasculature as before. Reverse lordotic projection. AICD noted. Suspected biapical pleuroparenchymal scarring similar to prior. The left lung base obscured by the pulse generator. No obvious airspace opacity or edema.  Bony demineralization. IMPRESSION: 1. Moderate enlargement of the cardiopericardial silhouette,  without edema. Visualized lungs appear clear. 2. Prominent central pulmonary artery suggesting pulmonary arterial hypertension. 3. Thoracic kyphosis.  Bony demineralization. Electronically Signed   By: Gaylyn Rong M.D.   On: 06/01/2016 11:05     Management plans discussed with the patient, family and they are in agreement.  CODE STATUS:     Code Status Orders        Start     Ordered   06/01/16 1908  Do not attempt resuscitation (DNR)  Continuous    Question Answer Comment  In the event of cardiac or respiratory ARREST Do not call a "code blue"   In the event of cardiac or respiratory ARREST Do not perform Intubation, CPR, defibrillation or ACLS   In the event of cardiac or respiratory ARREST Use medication by any route, position, wound care, and other measures to relive pain and suffering. May use oxygen, suction and manual treatment of airway obstruction as needed for comfort.      06/01/16 1907    Code Status History    Date Active Date Inactive Code Status Order ID Comments User Context   03/23/2016  3:12 PM 03/24/2016 11:46 PM Full Code [id number]  Renford Dills, MD Inpatient   03/23/2016  2:30 PM 03/23/2016  3:03 PM Full Code [id number]  Renford Dills, MD Inpatient   03/11/2015  4:35 PM 03/18/2015  5:41 PM Full Code [id number]  Ramonita Lab, MD Inpatient   03/11/2015  2:38 PM 03/11/2015  4:35 PM Full Code [id number]  Ramonita Lab, MD ED   10/08/2014 10:50 AM 10/08/2014  5:12 PM Full Code [id number]  Earl Lites  Wynonia Lawman, MD Inpatient    Advance Directive Documentation   Flowsheet Row Most Recent Value  Type of Advance Directive  Out of facility DNR (pink MOST or yellow form) [DNR]  Pre-existing out of facility DNR order (yellow form or pink MOST form)  -- [facility] MD placed order- no paperwork.. pt doesn't know where]  "MOST" Form in Place?  No data      TOTAL TIME TAKING CARE OF THIS PATIENT: 40 minutes.    Shamirah Ivan M.D on 06/03/2016 at 7:14 AM  Between 7am to 6pm -  Pager - [phone] After 6pm go to [email or web address] - Social research officer, government  [facility]  Office  [phone]  CC: Primary care physician; Patrice Paradise, MD

## 2016-06-03 NOTE — Care Management Note (Signed)
Case Management Note  Patient Details  Name: ZONDRA LAWLOR MRN: [id number] Date of Birth: 15-Dec-1928   Patient admitted with Flu.  Patient to discharge home today.  Patient lives at home alone.  Adult daughter available for support.  Patient has RW in the home for ambulation PCP [place].  Patient has qualifying sats for home O2.  Order has been written for home Rn, PT, and O2.  Patient was provided home health agency preference.  Patient has used [facility] in the past and would like to use them again.  Barbara Cower from [facility] notified of referral.  RNCM signing off.     Expected Discharge Date:  06/03/16               Expected Discharge Plan:  Home w Home Health Services  In-House Referral:     Discharge planning Services  CM Consult  Post Acute Care Choice:  Durable Medical Equipment, Home Health Choice offered to:  Patient  DME Arranged:  Oxygen DME Agency:  [facility]  HH Arranged:  RN, PT [facility] Agency:  [facility]  Status of Service:  Completed, signed off  If discussed at Long Length of Stay Meetings, dates discussed:    Additional Comments:  Chapman Fitch, RN 06/03/2016, 11:10 AM

## 2016-06-03 NOTE — Consult Note (Signed)
WOC Nurse wound follow up  Reason for Consult: Compression.  Left medial lower leg with a 0.5 cm scabbed lesion.  No edema noted today.  Patient refusing reapplication of Unnas boots today.  States she will follow up in Dr. Eustace Quail office next week and did not think she needed compression going forward.  Legs are not edematous.  Cautioned to keep them elevated and that edema will likely return without compression.  Verbalizes understanding.  Wound type:Chronic venous insufficiency.  Weekly Unna boots on Thursday.  Refused today.  Anticipated discharge.  Moisture retentive dressing to wound on left lower leg.  Pressure Injury POA: N/A Measurement:0.5 cm x 0.5 cm scabbed lesion left medial lower leg Wound GNF:AOZH Drainage (amount, consistency, odor) Minimal serosanguinous drainage.  No odor.   Periwound: Bruising noted.  Abrasions.   Dressing procedure/placement/frequency:Cleanse bilateral lower legs with soap and water.  Apply silicone border foam dressing to left lower leg wound.  WIll apply zinc layer secured with self adherent Coban on Thursday for compression.  Will change weekly.(refused today)  Follow up with Dr Lorretta Harp Will not follow at this time.  Please re-consult if needed.  Maple Hudson RN BSN CWON Pager [phone]

## 2016-06-03 NOTE — Progress Notes (Signed)
06/03/2016 2:20 PM  BP 134/84 (BP Location: Right Arm)   Pulse 79   Temp 98.1 F (36.7 C) (Oral)   Resp 18   Ht 5\' 4"  (1.626 m)   Wt 60.3 kg (133 lb)   SpO2 93%   BMI 22.83 kg/m  Patient discharged per MD orders. Discharge instructions reviewed with patient and patient verbalized understanding. IV removed per policy. Prescriptions discussed and sent to pharmacy. Discharged via wheelchair escorted by nursing staff on 2L oxygen on personal O2 tank.  , RN

## 2016-06-03 NOTE — Progress Notes (Signed)
SATURATION QUALIFICATIONS: (This note is used to comply with regulatory documentation for home oxygen)  Patient Saturations on Room Air at Rest = 85%  Patient Saturations on Room Air while Ambulating = 83%  Patient Saturations on 2 Liters of oxygen while Ambulating = 93%  Please briefly explain why patient needs home oxygen:

## 2016-06-06 LAB — CULTURE, BLOOD (ROUTINE X 2)
CULTURE: NO GROWTH
Culture: NO GROWTH

## 2016-06-08 ENCOUNTER — Ambulatory Visit (INDEPENDENT_AMBULATORY_CARE_PROVIDER_SITE_OTHER): Payer: PPO | Admitting: Vascular Surgery

## 2016-06-08 ENCOUNTER — Encounter (INDEPENDENT_AMBULATORY_CARE_PROVIDER_SITE_OTHER): Payer: PPO

## 2016-06-15 ENCOUNTER — Ambulatory Visit (INDEPENDENT_AMBULATORY_CARE_PROVIDER_SITE_OTHER): Payer: PPO | Admitting: Vascular Surgery

## 2016-06-15 ENCOUNTER — Ambulatory Visit (INDEPENDENT_AMBULATORY_CARE_PROVIDER_SITE_OTHER): Payer: PPO | Admitting: Nurse Practitioner

## 2016-06-15 ENCOUNTER — Encounter (INDEPENDENT_AMBULATORY_CARE_PROVIDER_SITE_OTHER): Payer: PPO

## 2016-06-15 ENCOUNTER — Encounter (INDEPENDENT_AMBULATORY_CARE_PROVIDER_SITE_OTHER): Payer: Self-pay

## 2016-06-15 NOTE — Progress Notes (Unsigned)
History of Present Illness  There is no documented history at this time  Assessments & Plan   There are no diagnoses linked to this encounter.    Additional instructions  Subjective:  Patient presents with venous ulcer of the Bilateral lower extremity.    Procedure:  3 layer unna wrap was placed Bilateral lower extremity.   Plan:   Follow up in one week.  

## 2016-06-16 DIAGNOSIS — D509 Iron deficiency anemia, unspecified: Secondary | ICD-10-CM | POA: Diagnosis not present

## 2016-06-16 DIAGNOSIS — I4891 Unspecified atrial fibrillation: Secondary | ICD-10-CM | POA: Diagnosis not present

## 2016-06-16 DIAGNOSIS — J101 Influenza due to other identified influenza virus with other respiratory manifestations: Secondary | ICD-10-CM | POA: Diagnosis not present

## 2016-06-16 DIAGNOSIS — R197 Diarrhea, unspecified: Secondary | ICD-10-CM | POA: Diagnosis not present

## 2016-06-16 DIAGNOSIS — E876 Hypokalemia: Secondary | ICD-10-CM | POA: Diagnosis not present

## 2016-06-18 ENCOUNTER — Telehealth (INDEPENDENT_AMBULATORY_CARE_PROVIDER_SITE_OTHER): Payer: Self-pay

## 2016-06-18 NOTE — Telephone Encounter (Signed)
Patient called to let us know that she was taking her Unna boots off because her leg is swelling more and causing her a lot of pain. She was in on Tuesday 06/15/16 and had the [employer] placed.

## 2016-06-22 DIAGNOSIS — I4891 Unspecified atrial fibrillation: Secondary | ICD-10-CM | POA: Diagnosis not present

## 2016-06-24 ENCOUNTER — Ambulatory Visit (INDEPENDENT_AMBULATORY_CARE_PROVIDER_SITE_OTHER): Payer: PPO | Admitting: Vascular Surgery

## 2016-06-24 ENCOUNTER — Encounter (INDEPENDENT_AMBULATORY_CARE_PROVIDER_SITE_OTHER): Payer: Self-pay | Admitting: Vascular Surgery

## 2016-06-24 VITALS — BP 80/57 | HR 72 | Resp 16

## 2016-06-24 DIAGNOSIS — M7989 Other specified soft tissue disorders: Secondary | ICD-10-CM

## 2016-06-24 DIAGNOSIS — I83229 Varicose veins of left lower extremity with both ulcer of unspecified site and inflammation: Secondary | ICD-10-CM

## 2016-06-24 DIAGNOSIS — L97929 Non-pressure chronic ulcer of unspecified part of left lower leg with unspecified severity: Secondary | ICD-10-CM

## 2016-06-24 NOTE — Progress Notes (Signed)
History of Present Illness  There is no documented history at this time  Assessments & Plan   There are no diagnoses linked to this encounter.    Additional instructions  Subjective:  Patient presents with venous ulcer of the Bilateral lower extremity.    Procedure:  3 layer unna wrap was placed Bilateral lower extremity.   Plan:   Follow up in one week.  

## 2016-06-29 DIAGNOSIS — R197 Diarrhea, unspecified: Secondary | ICD-10-CM | POA: Diagnosis not present

## 2016-06-29 DIAGNOSIS — I4891 Unspecified atrial fibrillation: Secondary | ICD-10-CM | POA: Diagnosis not present

## 2016-07-01 ENCOUNTER — Ambulatory Visit (INDEPENDENT_AMBULATORY_CARE_PROVIDER_SITE_OTHER): Payer: PPO | Admitting: Vascular Surgery

## 2016-07-01 ENCOUNTER — Encounter (INDEPENDENT_AMBULATORY_CARE_PROVIDER_SITE_OTHER): Payer: Self-pay | Admitting: Vascular Surgery

## 2016-07-01 VITALS — BP 114/73 | HR 62 | Resp 16 | Ht 61.0 in | Wt 133.0 lb

## 2016-07-01 DIAGNOSIS — M48062 Spinal stenosis, lumbar region with neurogenic claudication: Secondary | ICD-10-CM | POA: Diagnosis not present

## 2016-07-01 DIAGNOSIS — I872 Venous insufficiency (chronic) (peripheral): Secondary | ICD-10-CM

## 2016-07-01 DIAGNOSIS — K551 Chronic vascular disorders of intestine: Secondary | ICD-10-CM | POA: Diagnosis not present

## 2016-07-07 DIAGNOSIS — I10 Essential (primary) hypertension: Secondary | ICD-10-CM | POA: Diagnosis not present

## 2016-07-07 DIAGNOSIS — I739 Peripheral vascular disease, unspecified: Secondary | ICD-10-CM | POA: Diagnosis not present

## 2016-07-07 DIAGNOSIS — R0602 Shortness of breath: Secondary | ICD-10-CM | POA: Diagnosis not present

## 2016-07-07 DIAGNOSIS — R079 Chest pain, unspecified: Secondary | ICD-10-CM | POA: Diagnosis not present

## 2016-07-07 DIAGNOSIS — I70219 Atherosclerosis of native arteries of extremities with intermittent claudication, unspecified extremity: Secondary | ICD-10-CM | POA: Diagnosis not present

## 2016-07-07 DIAGNOSIS — I429 Cardiomyopathy, unspecified: Secondary | ICD-10-CM | POA: Diagnosis not present

## 2016-07-07 DIAGNOSIS — I4891 Unspecified atrial fibrillation: Secondary | ICD-10-CM | POA: Diagnosis not present

## 2016-07-07 DIAGNOSIS — I495 Sick sinus syndrome: Secondary | ICD-10-CM | POA: Diagnosis not present

## 2016-07-07 DIAGNOSIS — R0789 Other chest pain: Secondary | ICD-10-CM | POA: Diagnosis not present

## 2016-07-07 NOTE — Progress Notes (Signed)
Subjective:    Patient ID: Joanna Roberts, female    DOB: 1929-03-31, 83 y.o.   MRN: [id number] Chief Complaint  Patient presents with  . Leg Swelling    Unna check   Patient presents for a unna boot check / lower extremity edema. The patient continues to be non-compliant with unna boots. She is taking them off days before her scheduled change. She is also not elevating her legs as instructed to. Patient is asking for dilaudid prescription again. States she is not going to the pain clinic anymore. Denies any fever, nausea or vomiting. Denies any stomach pain. Patient is having "bone pain".    Review of Systems  Constitutional: Negative.   HENT: Negative.   Eyes: Negative.   Respiratory: Negative.   Cardiovascular: Positive for leg swelling.  Gastrointestinal: Negative.   Endocrine: Negative.   Genitourinary: Negative.   Musculoskeletal: Positive for back pain and gait problem.  Skin: Negative.   Allergic/Immunologic: Negative.   Hematological: Negative.   Psychiatric/Behavioral: Negative.       Objective:   Physical Exam  Constitutional: She is oriented to person, place, and time. She appears well-developed and well-nourished.  Uses walker.  HENT:  Head: Normocephalic and atraumatic.  Right Ear: External ear normal.  Eyes: Conjunctivae and EOM are normal. Pupils are equal, round, and reactive to light.  Neck: Normal range of motion.  Cardiovascular: Normal rate, regular rhythm and normal heart sounds.   Pulses:      Radial pulses are 2+ on the right side, and 2+ on the left side.  Pulmonary/Chest: Effort normal and breath sounds normal.  Musculoskeletal: Normal range of motion. She exhibits edema (Right Lower Extremity: Moderate 2+ Pitting Edema. No Ulcerations Noted. Left Lower Extremity: Mild Edema no ulcerations noted. ).  Neurological: She is alert and oriented to person, place, and time.  Skin: Skin is warm and dry.  Psychiatric: She has a normal mood and affect. Her  behavior is normal. Judgment and thought content normal.   BP 114/73 (BP Location: Right Arm)   Pulse 62   Resp 16   Ht 5\' 1"  (1.549 m)   Wt 133 lb (60.3 kg)   BMI 25.13 kg/m   Past Medical History:  Diagnosis Date  . AICD (automatic cardioverter/defibrillator) present   . Allergy   . Anemia   . Arthritis   . Cataract   . CHF (congestive heart failure) (HCC)   . Depression   . Dysrhythmia   . GERD (gastroesophageal reflux disease)   . Heart murmur   . Hypertension   . Myocardial infarction   . Osteoporosis   . Presence of permanent cardiac pacemaker   . Thyroid disease    Social History   Social History  . Marital status: Widowed    Spouse name: N/A  . Number of children: N/A  . Years of education: N/A   Occupational History  . Not on file.   Social History Main Topics  . Smoking status: Never Smoker  . Smokeless tobacco: Never Used  . Alcohol use No  . Drug use: No  . Sexual activity: Not Currently    Birth control/ protection: Post-menopausal   Other Topics Concern  . Not on file   Social History Narrative  . No narrative on file   Past Surgical History:  Procedure Laterality Date  . ABDOMINAL HYSTERECTOMY    . APPENDECTOMY    . HERNIA REPAIR    . INSERT / REPLACE / REMOVE PACEMAKER    .  PERIPHERAL VASCULAR CATHETERIZATION Right 10/08/2014   Procedure: Lower Extremity Angiography;  Surgeon: Renford Dills, MD;  Location: [facility];  Service: Cardiovascular;  Laterality: Right;  . PERIPHERAL VASCULAR CATHETERIZATION  10/08/2014   Procedure: Lower Extremity Intervention;  Surgeon: Renford Dills, MD;  Location: [facility];  Service: Cardiovascular;;  . PERIPHERAL VASCULAR CATHETERIZATION N/A 03/23/2016   Procedure: Visceral Angiography;  Surgeon: Renford Dills, MD;  Location: [facility];  Service: Cardiovascular;  Laterality: N/A;   Family History  Problem Relation Age of Onset  . Early death Mother   . Heart  disease Mother   . Arthritis Father   . Asthma Father   . Stroke Sister    Allergies  Allergen Reactions  . Lortab [Hydrocodone-Acetaminophen] Itching, Swelling and Rash  . Doxycycline Diarrhea  . Macrobid [Nitrofurantoin Monohyd Macro] Diarrhea and Nausea And Vomiting  . Nitrofurantoin Diarrhea, Nausea Only and Nausea And Vomiting      Assessment & Plan:  Patient presents for a unna boot check / lower extremity edema. The patient continues to be non-compliant with unna boots. She is taking them off days before her scheduled change. She is also not elevating her legs as instructed to. Patient is asking for dilaudid prescription again. States she is not going to the pain clinic anymore. Denies any fever, nausea or vomiting. Denies any stomach pain. Patient is having "bone pain".   1. Spinal stenosis, lumbar region, with neurogenic claudication - Stable Chronic pain issues. Encouraged patient to reach out to PCP if not going to pain clinic for chronic pain control. Explained we can not manage her chronic pain issues.   2. Chronic venous insufficiency - Worsening Continue bilateral unna wraps. Had a long discussion about compliance today.  Patient encouraged not to cut off unna wraps before weekly changes. Discussed appropriate elevation. Discussed lymphedema pump again - patient still undecided if she wants to pursue this additional therapy.   3. Chronic mesenteric ischemia (HCC) - Stable Followed regularly.  Asymptomatic at this time.   Current Outpatient Prescriptions on File Prior to Visit  Medication Sig Dispense Refill  . aspirin EC 81 MG EC tablet Take 1 tablet (81 mg total) by mouth daily. 180 tablet 2  . carvedilol (COREG) 25 MG tablet TAKE ONE TABLET BY MOUTH TWICE DAILY WITH  MEALS    . cephALEXin (KEFLEX) 500 MG capsule Take 1 capsule (500 mg total) by mouth every 12 (twelve) hours. 6 capsule 0  . diphenoxylate-atropine (LOMOTIL) 2.5-0.025 MG tablet Take 1-2 tablets by  mouth 4 (four) times daily as needed for diarrhea or loose stools. 40 tablet 0  . docusate sodium (COLACE) 100 MG capsule Take 1 capsule (100 mg total) by mouth 2 (two) times daily as needed (take to keep stool soft.). 60 capsule 0  . furosemide (LASIX) 20 MG tablet Take 1 tablet (20 mg total) by mouth daily. (Patient taking differently: Take 20 mg by mouth daily as needed for fluid. ) 30 tablet 0  . gabapentin (NEURONTIN) 300 MG capsule Take 300 mg by mouth at bedtime.     Marland Kitchen guaiFENesin-dextromethorphan (ROBITUSSIN DM) 100-10 MG/5ML syrup Take 5 mLs by mouth every 4 (four) hours as needed for cough. 118 mL 0  . HYDROcodone-acetaminophen (NORCO/VICODIN) 5-325 MG tablet Limit one half to one tab by mouth per day or 2-3 times per day if tolerated 80 tablet 0  . levothyroxine (SYNTHROID, LEVOTHROID) 25 MCG tablet Take 25 mcg by mouth daily  before breakfast.    . loperamide (ANTI-DIARRHEAL) 2 MG capsule Take 2 mg by mouth as needed for diarrhea or loose stools.    Marland Kitchen losartan (COZAAR) 25 MG tablet TAKE ONE TABLET EVERY DAY    . nitroGLYCERIN (NITROSTAT) 0.4 MG SL tablet Place 0.4 mg under the tongue every 5 (five) minutes as needed for chest pain.     Marland Kitchen omeprazole (PRILOSEC) 20 MG capsule     . ondansetron (ZOFRAN) 4 MG tablet     . oseltamivir (TAMIFLU) 30 MG capsule Take 1 capsule (30 mg total) by mouth 2 (two) times daily. 8 capsule 0  . temazepam (RESTORIL) 7.5 MG capsule Take 7.5 mg by mouth at bedtime.     . vitamin B-12 (CYANOCOBALAMIN) 1000 MCG tablet Take 1,000 mcg by mouth daily.    Marland Kitchen warfarin (COUMADIN) 2.5 MG tablet Take 2.5 mg by mouth daily.      Current Facility-Administered Medications on File Prior to Visit  Medication Dose Route Frequency Provider Last Rate Last Dose  . mirabegron ER (MYRBETRIQ) tablet 25 mg  25 mg Oral Daily Hildred Laser, MD        There are no Patient Instructions on file for this visit. No Follow-up on file.   Oumou Smead A Worth Kober, PA-C

## 2016-07-08 ENCOUNTER — Ambulatory Visit (INDEPENDENT_AMBULATORY_CARE_PROVIDER_SITE_OTHER): Payer: PPO | Admitting: Vascular Surgery

## 2016-07-08 ENCOUNTER — Encounter (INDEPENDENT_AMBULATORY_CARE_PROVIDER_SITE_OTHER): Payer: Self-pay

## 2016-07-08 ENCOUNTER — Encounter (INDEPENDENT_AMBULATORY_CARE_PROVIDER_SITE_OTHER): Payer: PPO

## 2016-07-08 VITALS — BP 113/77 | HR 57 | Resp 14 | Wt 136.0 lb

## 2016-07-08 DIAGNOSIS — M7989 Other specified soft tissue disorders: Secondary | ICD-10-CM | POA: Diagnosis not present

## 2016-07-08 NOTE — Progress Notes (Signed)
History of Present Illness  There is no documented history at this time  Assessments & Plan   There are no diagnoses linked to this encounter.    Additional instructions  Subjective:  Patient presents with venous ulcer of the Bilateral lower extremity.    Procedure:  3 layer unna wrap was placed Bilateral lower extremity.   Plan:   Follow up in one week.  

## 2016-07-13 DIAGNOSIS — F4321 Adjustment disorder with depressed mood: Secondary | ICD-10-CM | POA: Diagnosis not present

## 2016-07-13 DIAGNOSIS — I4891 Unspecified atrial fibrillation: Secondary | ICD-10-CM | POA: Diagnosis not present

## 2016-07-13 DIAGNOSIS — G4700 Insomnia, unspecified: Secondary | ICD-10-CM | POA: Diagnosis not present

## 2016-07-14 ENCOUNTER — Encounter (INDEPENDENT_AMBULATORY_CARE_PROVIDER_SITE_OTHER): Payer: Self-pay

## 2016-07-14 ENCOUNTER — Encounter (INDEPENDENT_AMBULATORY_CARE_PROVIDER_SITE_OTHER): Payer: PPO

## 2016-07-15 ENCOUNTER — Encounter (INDEPENDENT_AMBULATORY_CARE_PROVIDER_SITE_OTHER): Payer: Self-pay | Admitting: Vascular Surgery

## 2016-07-15 ENCOUNTER — Encounter (INDEPENDENT_AMBULATORY_CARE_PROVIDER_SITE_OTHER): Payer: PPO

## 2016-07-15 ENCOUNTER — Ambulatory Visit (INDEPENDENT_AMBULATORY_CARE_PROVIDER_SITE_OTHER): Payer: PPO | Admitting: Vascular Surgery

## 2016-07-15 VITALS — BP 107/75 | HR 73 | Resp 16 | Ht 61.0 in | Wt 134.0 lb

## 2016-07-15 DIAGNOSIS — I872 Venous insufficiency (chronic) (peripheral): Secondary | ICD-10-CM

## 2016-07-15 NOTE — Progress Notes (Signed)
History of Present Illness  There is no documented history at this time  Assessments & Plan   There are no diagnoses linked to this encounter.    Additional instructions  Subjective:  Patient presents with venous ulcer of the Bilateral lower extremity.    Procedure:  3 layer unna wrap was placed Bilateral lower extremity.   Plan:   Follow up in one week.  

## 2016-07-21 ENCOUNTER — Encounter (INDEPENDENT_AMBULATORY_CARE_PROVIDER_SITE_OTHER): Payer: Self-pay | Admitting: Vascular Surgery

## 2016-07-21 ENCOUNTER — Ambulatory Visit (INDEPENDENT_AMBULATORY_CARE_PROVIDER_SITE_OTHER): Payer: PPO | Admitting: Vascular Surgery

## 2016-07-21 VITALS — BP 110/77 | HR 71 | Resp 16

## 2016-07-21 DIAGNOSIS — M7989 Other specified soft tissue disorders: Secondary | ICD-10-CM

## 2016-07-21 NOTE — Progress Notes (Signed)
History of Present Illness  There is no documented history at this time  Assessments & Plan   There are no diagnoses linked to this encounter.    Additional instructions  Subjective:  Patient presents with venous ulcer of the Bilateral lower extremity.    Procedure:  3 layer unna wrap was placed Bilateral lower extremity.   Plan:   Follow up in one week.  

## 2016-07-22 ENCOUNTER — Encounter (INDEPENDENT_AMBULATORY_CARE_PROVIDER_SITE_OTHER): Payer: PPO

## 2016-07-29 ENCOUNTER — Encounter (INDEPENDENT_AMBULATORY_CARE_PROVIDER_SITE_OTHER): Payer: Self-pay | Admitting: Vascular Surgery

## 2016-07-29 ENCOUNTER — Ambulatory Visit (INDEPENDENT_AMBULATORY_CARE_PROVIDER_SITE_OTHER): Payer: PPO | Admitting: Vascular Surgery

## 2016-07-29 VITALS — BP 115/75 | HR 68 | Resp 16 | Wt 136.0 lb

## 2016-07-29 DIAGNOSIS — I890 Lymphedema, not elsewhere classified: Secondary | ICD-10-CM

## 2016-07-29 DIAGNOSIS — M48062 Spinal stenosis, lumbar region with neurogenic claudication: Secondary | ICD-10-CM

## 2016-07-29 NOTE — Progress Notes (Signed)
Subjective:    Patient ID: Joanna Roberts, female    DOB: [birth year], 83 y.o.   MRN: [id number] Chief Complaint  Patient presents with  . Follow-up    unna check   Patient presents after a month of unna wraps for bilateral lower extremity lymphedema exacerbation with ulceration. She has been treated for four weeks with unna wraps. She has an extensive history of venous insufficiency s/p laser ablation, lymphedema exacerbation and lower extremity ulcer development. She has been treated by the wound center and our clinic for venous / lymphedema ulceration. After four weeks, in unna wraps her ulcers have healed. She denies any fever, nausea or vomiting.    Review of Systems  Constitutional: Negative.   HENT: Negative.   Eyes: Negative.   Respiratory: Negative.   Cardiovascular: Positive for leg swelling.  Gastrointestinal: Negative.   Endocrine: Negative.   Genitourinary: Negative.   Musculoskeletal: Negative.   Skin: Negative.   Allergic/Immunologic: Negative.   Neurological: Negative.   Hematological: Negative.   Psychiatric/Behavioral: Negative.       Objective:   Physical Exam  Constitutional: She is oriented to person, place, and time. She appears well-developed and well-nourished. No distress.  HENT:  Head: Normocephalic and atraumatic.  Eyes: Conjunctivae are normal. Pupils are equal, round, and reactive to light.  Neck: Normal range of motion.  Cardiovascular: Normal rate, regular rhythm and normal heart sounds.   Pulses:      Radial pulses are 2+ on the right side, and 2+ on the left side.       Dorsalis pedis pulses are 2+ on the right side, and 2+ on the left side.       Posterior tibial pulses are 2+ on the right side, and 2+ on the left side.  Pulmonary/Chest: Effort normal.  Musculoskeletal: Normal range of motion. She exhibits edema (Moderate Edema).  Neurological: She is alert and oriented to person, place, and time.  Skin: Skin is warm and dry. She is not  diaphoretic.  Psychiatric: She has a normal mood and affect. Her behavior is normal. Judgment and thought content normal.  Vitals reviewed.  BP 115/75   Pulse 68   Resp 16   Wt 136 lb (61.7 kg)   BMI 25.70 kg/m   Past Medical History:  Diagnosis Date  . AICD (automatic cardioverter/defibrillator) present   . Allergy   . Anemia   . Arthritis   . Cataract   . CHF (congestive heart failure) (HCC)   . Depression   . Dysrhythmia   . GERD (gastroesophageal reflux disease)   . Heart murmur   . Hypertension   . Myocardial infarction   . Osteoporosis   . Presence of permanent cardiac pacemaker   . Thyroid disease    Social History   Social History  . Marital status: Widowed    Spouse name: N/A  . Number of children: N/A  . Years of education: N/A   Occupational History  . Not on file.   Social History Main Topics  . Smoking status: Never Smoker  . Smokeless tobacco: Never Used  . Alcohol use No  . Drug use: No  . Sexual activity: Not Currently    Birth control/ protection: Post-menopausal   Other Topics Concern  . Not on file   Social History Narrative  . No narrative on file   Past Surgical History:  Procedure Laterality Date  . ABDOMINAL HYSTERECTOMY    . APPENDECTOMY    . HERNIA REPAIR    .  INSERT / REPLACE / REMOVE PACEMAKER    . PERIPHERAL VASCULAR CATHETERIZATION Right 10/08/2014   Procedure: Lower Extremity Angiography;  Surgeon: Renford Dills, MD;  Location: [facility];  Service: Cardiovascular;  Laterality: Right;  . PERIPHERAL VASCULAR CATHETERIZATION  10/08/2014   Procedure: Lower Extremity Intervention;  Surgeon: Renford Dills, MD;  Location: [facility];  Service: Cardiovascular;;  . PERIPHERAL VASCULAR CATHETERIZATION N/A 03/23/2016   Procedure: Visceral Angiography;  Surgeon: Renford Dills, MD;  Location: [facility];  Service: Cardiovascular;  Laterality: N/A;   Family History  Problem Relation Age of  Onset  . Early death Mother   . Heart disease Mother   . Arthritis Father   . Asthma Father   . Stroke Sister    Allergies  Allergen Reactions  . Lortab [Hydrocodone-Acetaminophen] Itching, Swelling and Rash  . Doxycycline Diarrhea  . Macrobid [Nitrofurantoin Monohyd Macro] Diarrhea and Nausea And Vomiting  . Nitrofurantoin Diarrhea, Nausea Only and Nausea And Vomiting      Assessment & Plan:  Patient presents after a month of unna wraps for bilateral lower extremity lymphedema exacerbation with ulceration. She has been treated for four weeks with unna wraps. She has an extensive history of venous insufficiency s/p laser ablation, lymphedema exacerbation and lower extremity ulcer development. She has been treated by the wound center and our clinic for venous / lymphedema ulceration. After four weeks, in unna wraps her ulcers have healed. She denies any fever, nausea or vomiting.   1. Lymphedema - Improvement Improvement in ulcerations and edema s/p four weeks of unna boot therapy. OK to discontinue unna wraps and transition to compression stockings.  Despite conservative treatments including remaining active, class one compression stockings, unna boot therapy and elevation the patient presents with Stage 2 Lymphedema.  The patient would greatly benefit from the added therapy of a lymphedema pump. Will apply to insurance.   2. Spinal stenosis, lumbar region, with neurogenic claudication - Stable Contributing factor to patient lower extremity pain. Pain being managed by PCP.   Current Outpatient Prescriptions on File Prior to Visit  Medication Sig Dispense Refill  . aspirin EC 81 MG EC tablet Take 1 tablet (81 mg total) by mouth daily. 180 tablet 2  . budesonide (ENTOCORT EC) 3 MG 24 hr capsule     . carvedilol (COREG) 25 MG tablet TAKE ONE TABLET BY MOUTH TWICE DAILY WITH  MEALS    . cephALEXin (KEFLEX) 500 MG capsule Take 1 capsule (500 mg total) by mouth every 12 (twelve) hours. 6  capsule 0  . citalopram (CELEXA) 10 MG tablet     . diphenoxylate-atropine (LOMOTIL) 2.5-0.025 MG tablet Take 1-2 tablets by mouth 4 (four) times daily as needed for diarrhea or loose stools. 40 tablet 0  . docusate sodium (COLACE) 100 MG capsule Take 1 capsule (100 mg total) by mouth 2 (two) times daily as needed (take to keep stool soft.). 60 capsule 0  . furosemide (LASIX) 20 MG tablet Take 1 tablet (20 mg total) by mouth daily. (Patient taking differently: Take 20 mg by mouth daily as needed for fluid. ) 30 tablet 0  . gabapentin (NEURONTIN) 300 MG capsule Take 300 mg by mouth at bedtime.     Marland Kitchen guaiFENesin-dextromethorphan (ROBITUSSIN DM) 100-10 MG/5ML syrup Take 5 mLs by mouth every 4 (four) hours as needed for cough. 118 mL 0  . HYDROcodone-acetaminophen (NORCO/VICODIN) 5-325 MG tablet Limit one half to one tab by mouth per day  or 2-3 times per day if tolerated 80 tablet 0  . levothyroxine (SYNTHROID, LEVOTHROID) 25 MCG tablet Take 25 mcg by mouth daily before breakfast.    . loperamide (ANTI-DIARRHEAL) 2 MG capsule Take 2 mg by mouth as needed for diarrhea or loose stools.    Marland Kitchen losartan (COZAAR) 25 MG tablet TAKE ONE TABLET EVERY DAY    . nitroGLYCERIN (NITROSTAT) 0.4 MG SL tablet Place 0.4 mg under the tongue every 5 (five) minutes as needed for chest pain.     Marland Kitchen omeprazole (PRILOSEC) 20 MG capsule     . ondansetron (ZOFRAN) 4 MG tablet     . oseltamivir (TAMIFLU) 30 MG capsule Take 1 capsule (30 mg total) by mouth 2 (two) times daily. 8 capsule 0  . temazepam (RESTORIL) 7.5 MG capsule Take 7.5 mg by mouth at bedtime.     . vitamin B-12 (CYANOCOBALAMIN) 1000 MCG tablet Take 1,000 mcg by mouth daily.    Marland Kitchen warfarin (COUMADIN) 2.5 MG tablet Take 2.5 mg by mouth daily.      Current Facility-Administered Medications on File Prior to Visit  Medication Dose Route Frequency Provider Last Rate Last Dose  . mirabegron ER (MYRBETRIQ) tablet 25 mg  25 mg Oral Daily Hildred Laser, MD        There  are no Patient Instructions on file for this visit. No Follow-up on file.   Tasnim Balentine A Autumne Kallio, PA-C

## 2016-08-03 DIAGNOSIS — I4891 Unspecified atrial fibrillation: Secondary | ICD-10-CM | POA: Diagnosis not present

## 2016-08-03 DIAGNOSIS — N3000 Acute cystitis without hematuria: Secondary | ICD-10-CM | POA: Diagnosis not present

## 2016-08-03 DIAGNOSIS — J209 Acute bronchitis, unspecified: Secondary | ICD-10-CM | POA: Diagnosis not present

## 2016-08-04 DIAGNOSIS — R0602 Shortness of breath: Secondary | ICD-10-CM | POA: Diagnosis not present

## 2016-08-04 DIAGNOSIS — N390 Urinary tract infection, site not specified: Secondary | ICD-10-CM | POA: Diagnosis not present

## 2016-08-04 DIAGNOSIS — R0789 Other chest pain: Secondary | ICD-10-CM | POA: Diagnosis not present

## 2016-08-11 ENCOUNTER — Ambulatory Visit (INDEPENDENT_AMBULATORY_CARE_PROVIDER_SITE_OTHER): Payer: PPO | Admitting: Vascular Surgery

## 2016-08-11 ENCOUNTER — Encounter (INDEPENDENT_AMBULATORY_CARE_PROVIDER_SITE_OTHER): Payer: Self-pay | Admitting: Vascular Surgery

## 2016-08-11 VITALS — BP 125/80 | HR 69 | Resp 16 | Wt 134.0 lb

## 2016-08-11 DIAGNOSIS — M5416 Radiculopathy, lumbar region: Secondary | ICD-10-CM | POA: Diagnosis not present

## 2016-08-11 DIAGNOSIS — I890 Lymphedema, not elsewhere classified: Secondary | ICD-10-CM

## 2016-08-11 NOTE — Progress Notes (Signed)
Subjective:    Patient ID: Joanna Roberts, female    DOB: 1928/10/23, 83 y.o.   MRN: [id number] Chief Complaint  Patient presents with  . Follow-up   Patient last seen on 07/29/16. Presents today with bilateral lower extremity edema. Long standing history of lower extremity lymphedema and venous disease with exacerbations requiring unna boot therapy. Currently waiting for lymphedema pump. No active ulcerations. Pain associated with edema. No fever, nausea or vomiting.    Review of Systems  Constitutional: Negative.   HENT: Negative.   Eyes: Negative.   Respiratory: Negative.   Cardiovascular: Positive for leg swelling.  Gastrointestinal: Negative.   Endocrine: Negative.   Genitourinary: Negative.   Musculoskeletal: Negative.   Skin: Negative.   Allergic/Immunologic: Negative.   Neurological: Negative.   Hematological: Negative.   Psychiatric/Behavioral: Negative.       Objective:   Physical Exam  Constitutional: She is oriented to person, place, and time. She appears well-developed and well-nourished. No distress.  HENT:  Head: Normocephalic and atraumatic.  Eyes: Conjunctivae are normal. Pupils are equal, round, and reactive to light.  Neck: Normal range of motion.  Cardiovascular: Normal rate, regular rhythm and normal heart sounds.   Pulses:      Radial pulses are 2+ on the right side, and 2+ on the left side.  Hard to appreciate pedal pulses due to pitting edema  Pulmonary/Chest: Effort normal.  Musculoskeletal: Normal range of motion. She exhibits edema (Moderate bilatearal R>L 1+ pitting edema).  Neurological: She is alert and oriented to person, place, and time.  Skin: Skin is warm and dry. She is not diaphoretic.  Psychiatric: She has a normal mood and affect. Her behavior is normal. Judgment and thought content normal.  Vitals reviewed.  BP 125/80   Pulse 69   Resp 16   Wt 134 lb (60.8 kg)   BMI 25.32 kg/m   Past Medical History:  Diagnosis Date  . AICD  (automatic cardioverter/defibrillator) present   . Allergy   . Anemia   . Arthritis   . Cataract   . CHF (congestive heart failure) (HCC)   . Depression   . Dysrhythmia   . GERD (gastroesophageal reflux disease)   . Heart murmur   . Hypertension   . Myocardial infarction (HCC)   . Osteoporosis   . Presence of permanent cardiac pacemaker   . Thyroid disease    Social History   Social History  . Marital status: Widowed    Spouse name: N/A  . Number of children: N/A  . Years of education: N/A   Occupational History  . Not on file.   Social History Main Topics  . Smoking status: Never Smoker  . Smokeless tobacco: Never Used  . Alcohol use No  . Drug use: No  . Sexual activity: Not Currently    Birth control/ protection: Post-menopausal   Other Topics Concern  . Not on file   Social History Narrative  . No narrative on file   Past Surgical History:  Procedure Laterality Date  . ABDOMINAL HYSTERECTOMY    . APPENDECTOMY    . HERNIA REPAIR    . INSERT / REPLACE / REMOVE PACEMAKER    . PERIPHERAL VASCULAR CATHETERIZATION Right 10/08/2014   Procedure: Lower Extremity Angiography;  Surgeon: Renford Dills, MD;  Location: [facility];  Service: Cardiovascular;  Laterality: Right;  . PERIPHERAL VASCULAR CATHETERIZATION  10/08/2014   Procedure: Lower Extremity Intervention;  Surgeon: Renford Dills, MD;  Location:  [facility];  Service: Cardiovascular;;  . PERIPHERAL VASCULAR CATHETERIZATION N/A 03/23/2016   Procedure: Visceral Angiography;  Surgeon: Renford Dills, MD;  Location: [facility];  Service: Cardiovascular;  Laterality: N/A;   Family History  Problem Relation Age of Onset  . Early death Mother   . Heart disease Mother   . Arthritis Father   . Asthma Father   . Stroke Sister    Allergies  Allergen Reactions  . Lortab [Hydrocodone-Acetaminophen] Itching, Swelling and Rash  . Doxycycline Diarrhea  . Macrobid  [Nitrofurantoin Monohyd Macro] Diarrhea and Nausea And Vomiting  . Nitrofurantoin Diarrhea, Nausea Only and Nausea And Vomiting      Assessment & Plan:  Patient last seen on 07/29/16. Presents today with bilateral lower extremity edema. Long standing history of lower extremity lymphedema and venous disease with exacerbations requiring unna boot therapy. Currently waiting for lymphedema pump. No active ulcerations. Pain associated with edema. No fever, nausea or vomiting.   1. Lymphedema - Exacerbation Patient with exacerbation. Pitting edema. Awaiting lymphedema pump - it has been ordered and they have called the patient. Recommend at least two weeks of unna boot therapy. Elevation.  2. Lumbar radiculopathy - Stable Major contributor to the patients lower extremity pain  Current Outpatient Prescriptions on File Prior to Visit  Medication Sig Dispense Refill  . aspirin EC 81 MG EC tablet Take 1 tablet (81 mg total) by mouth daily. 180 tablet 2  . budesonide (ENTOCORT EC) 3 MG 24 hr capsule     . carvedilol (COREG) 25 MG tablet TAKE ONE TABLET BY MOUTH TWICE DAILY WITH  MEALS    . cephALEXin (KEFLEX) 500 MG capsule Take 1 capsule (500 mg total) by mouth every 12 (twelve) hours. 6 capsule 0  . citalopram (CELEXA) 10 MG tablet     . diphenoxylate-atropine (LOMOTIL) 2.5-0.025 MG tablet Take 1-2 tablets by mouth 4 (four) times daily as needed for diarrhea or loose stools. 40 tablet 0  . docusate sodium (COLACE) 100 MG capsule Take 1 capsule (100 mg total) by mouth 2 (two) times daily as needed (take to keep stool soft.). 60 capsule 0  . furosemide (LASIX) 20 MG tablet Take 1 tablet (20 mg total) by mouth daily. (Patient taking differently: Take 20 mg by mouth daily as needed for fluid. ) 30 tablet 0  . gabapentin (NEURONTIN) 300 MG capsule Take 300 mg by mouth at bedtime.     Marland Kitchen guaiFENesin-dextromethorphan (ROBITUSSIN DM) 100-10 MG/5ML syrup Take 5 mLs by mouth every 4 (four) hours as needed for  cough. 118 mL 0  . HYDROcodone-acetaminophen (NORCO/VICODIN) 5-325 MG tablet Limit one half to one tab by mouth per day or 2-3 times per day if tolerated 80 tablet 0  . levothyroxine (SYNTHROID, LEVOTHROID) 25 MCG tablet Take 25 mcg by mouth daily before breakfast.    . loperamide (ANTI-DIARRHEAL) 2 MG capsule Take 2 mg by mouth as needed for diarrhea or loose stools.    Marland Kitchen losartan (COZAAR) 25 MG tablet TAKE ONE TABLET EVERY DAY    . nitroGLYCERIN (NITROSTAT) 0.4 MG SL tablet Place 0.4 mg under the tongue every 5 (five) minutes as needed for chest pain.     Marland Kitchen omeprazole (PRILOSEC) 20 MG capsule     . ondansetron (ZOFRAN) 4 MG tablet     . oseltamivir (TAMIFLU) 30 MG capsule Take 1 capsule (30 mg total) by mouth 2 (two) times daily. 8 capsule 0  . temazepam (RESTORIL) 7.5 MG  capsule Take 7.5 mg by mouth at bedtime.     . vitamin B-12 (CYANOCOBALAMIN) 1000 MCG tablet Take 1,000 mcg by mouth daily.    Marland Kitchen warfarin (COUMADIN) 2.5 MG tablet Take 2.5 mg by mouth daily.      Current Facility-Administered Medications on File Prior to Visit  Medication Dose Route Frequency Provider Last Rate Last Dose  . mirabegron ER (MYRBETRIQ) tablet 25 mg  25 mg Oral Daily Hildred Laser, MD        There are no Patient Instructions on file for this visit. No Follow-up on file.   Mikhai Bienvenue A Divine Imber, PA-C

## 2016-08-18 ENCOUNTER — Encounter (INDEPENDENT_AMBULATORY_CARE_PROVIDER_SITE_OTHER): Payer: Self-pay

## 2016-08-18 ENCOUNTER — Ambulatory Visit (INDEPENDENT_AMBULATORY_CARE_PROVIDER_SITE_OTHER): Payer: PPO | Admitting: Vascular Surgery

## 2016-08-18 VITALS — BP 99/74 | HR 74 | Resp 16

## 2016-08-18 DIAGNOSIS — I890 Lymphedema, not elsewhere classified: Secondary | ICD-10-CM

## 2016-08-18 DIAGNOSIS — M7989 Other specified soft tissue disorders: Secondary | ICD-10-CM | POA: Diagnosis not present

## 2016-08-18 NOTE — Progress Notes (Signed)
History of Present Illness  There is no documented history at this time  Assessments & Plan   There are no diagnoses linked to this encounter.    Additional instructions  Subjective:  Patient presents with venous ulcer of the Bilateral lower extremity.    Procedure:  3 layer unna wrap was placed Bilateral lower extremity.   Plan:   Follow up in one week.  

## 2016-08-19 ENCOUNTER — Encounter (INDEPENDENT_AMBULATORY_CARE_PROVIDER_SITE_OTHER): Payer: PPO

## 2016-08-19 DIAGNOSIS — M545 Low back pain: Secondary | ICD-10-CM | POA: Diagnosis not present

## 2016-08-19 DIAGNOSIS — R350 Frequency of micturition: Secondary | ICD-10-CM | POA: Diagnosis not present

## 2016-08-19 DIAGNOSIS — Z87440 Personal history of urinary (tract) infections: Secondary | ICD-10-CM | POA: Diagnosis not present

## 2016-08-19 DIAGNOSIS — I4891 Unspecified atrial fibrillation: Secondary | ICD-10-CM | POA: Diagnosis not present

## 2016-08-19 DIAGNOSIS — M48062 Spinal stenosis, lumbar region with neurogenic claudication: Secondary | ICD-10-CM | POA: Diagnosis not present

## 2016-08-19 DIAGNOSIS — K551 Chronic vascular disorders of intestine: Secondary | ICD-10-CM | POA: Diagnosis not present

## 2016-08-19 DIAGNOSIS — G8929 Other chronic pain: Secondary | ICD-10-CM | POA: Diagnosis not present

## 2016-08-19 DIAGNOSIS — I10 Essential (primary) hypertension: Secondary | ICD-10-CM | POA: Diagnosis not present

## 2016-08-24 ENCOUNTER — Encounter (INDEPENDENT_AMBULATORY_CARE_PROVIDER_SITE_OTHER): Payer: Self-pay | Admitting: Vascular Surgery

## 2016-08-24 ENCOUNTER — Ambulatory Visit (INDEPENDENT_AMBULATORY_CARE_PROVIDER_SITE_OTHER): Payer: PPO | Admitting: Vascular Surgery

## 2016-08-24 ENCOUNTER — Other Ambulatory Visit (INDEPENDENT_AMBULATORY_CARE_PROVIDER_SITE_OTHER): Payer: Self-pay | Admitting: Vascular Surgery

## 2016-08-24 VITALS — BP 97/66 | HR 64 | Resp 16 | Ht 61.0 in | Wt 135.0 lb

## 2016-08-24 DIAGNOSIS — B373 Candidiasis of vulva and vagina: Secondary | ICD-10-CM | POA: Diagnosis not present

## 2016-08-24 DIAGNOSIS — I890 Lymphedema, not elsewhere classified: Secondary | ICD-10-CM | POA: Diagnosis not present

## 2016-08-24 DIAGNOSIS — M48062 Spinal stenosis, lumbar region with neurogenic claudication: Secondary | ICD-10-CM

## 2016-08-24 DIAGNOSIS — N898 Other specified noninflammatory disorders of vagina: Secondary | ICD-10-CM | POA: Diagnosis not present

## 2016-08-24 DIAGNOSIS — Z4689 Encounter for fitting and adjustment of other specified devices: Secondary | ICD-10-CM | POA: Diagnosis not present

## 2016-08-24 MED ORDER — SILVER SULFADIAZINE 1 % EX CREA: 1.0000 "application " | TOPICAL_CREAM | Freq: Two times a day (BID) | CUTANEOUS | 2 refills | Status: DC

## 2016-08-24 NOTE — Progress Notes (Signed)
Subjective:    Patient ID: Joanna Roberts, female    DOB: [birth year], 83 y.o.   MRN: [id number] Chief Complaint  Patient presents with  . Re-evaluation    1 month lymph check and unna check   Patient presents after two weeks in unna boots for a lymphedema exacerbation with weeping. The patient states the lymphedema pump company have contacted her and they will be delivering the pump soon. Her swelling and weeping have improved. She is looking forward to the pump. Denies any ulceration to her lower extremity. Denies any fever, nausea or vomiting.    Review of Systems  Constitutional: Negative.   HENT: Negative.   Eyes: Negative.   Respiratory: Negative.   Cardiovascular: Positive for leg swelling.  Gastrointestinal: Negative.   Endocrine: Negative.   Genitourinary: Negative.   Musculoskeletal: Negative.   Skin: Negative.   Allergic/Immunologic: Negative.   Neurological: Negative.   Hematological: Negative.   Psychiatric/Behavioral: Negative.       Objective:   Physical Exam  Constitutional: She is oriented to person, place, and time. She appears well-developed and well-nourished. No distress.  HENT:  Head: Normocephalic and atraumatic.  Right Ear: External ear normal.  Left Ear: External ear normal.  Eyes: Conjunctivae are normal. Pupils are equal, round, and reactive to light.  Neck: Normal range of motion.  Cardiovascular: Normal rate, regular rhythm, normal heart sounds and intact distal pulses.   Pulses:      Radial pulses are 2+ on the right side, and 2+ on the left side.       Dorsalis pedis pulses are 1+ on the right side, and 1+ on the left side.       Posterior tibial pulses are 1+ on the right side, and 1+ on the left side.  Pulmonary/Chest: Effort normal.  Musculoskeletal: Normal range of motion. She exhibits edema (Mild edema notable).  Neurological: She is alert and oriented to person, place, and time.  Skin: Skin is warm and dry. She is not diaphoretic.    Psychiatric: She has a normal mood and affect. Her behavior is normal. Judgment and thought content normal.  Vitals reviewed.  BP 97/66 (BP Location: Right Arm)   Pulse 64   Resp 16   Ht 5\' 1"  (1.549 m)   Wt 135 lb (61.2 kg)   BMI 25.51 kg/m   Past Medical History:  Diagnosis Date  . AICD (automatic cardioverter/defibrillator) present   . Allergy   . Anemia   . Arthritis   . Cataract   . CHF (congestive heart failure) (HCC)   . Depression   . Dysrhythmia   . GERD (gastroesophageal reflux disease)   . Heart murmur   . Hypertension   . Myocardial infarction (HCC)   . Osteoporosis   . Presence of permanent cardiac pacemaker   . Thyroid disease    Social History   Social History  . Marital status: Widowed    Spouse name: N/A  . Number of children: N/A  . Years of education: N/A   Occupational History  . Not on file.   Social History Main Topics  . Smoking status: Never Smoker  . Smokeless tobacco: Never Used  . Alcohol use No  . Drug use: No  . Sexual activity: Not Currently    Birth control/ protection: Post-menopausal   Other Topics Concern  . Not on file   Social History Narrative  . No narrative on file   Past Surgical History:  Procedure Laterality Date  .  ABDOMINAL HYSTERECTOMY    . APPENDECTOMY    . HERNIA REPAIR    . INSERT / REPLACE / REMOVE PACEMAKER    . PERIPHERAL VASCULAR CATHETERIZATION Right 10/08/2014   Procedure: Lower Extremity Angiography;  Surgeon: Renford Dills, MD;  Location: [facility];  Service: Cardiovascular;  Laterality: Right;  . PERIPHERAL VASCULAR CATHETERIZATION  10/08/2014   Procedure: Lower Extremity Intervention;  Surgeon: Renford Dills, MD;  Location: [facility];  Service: Cardiovascular;;  . PERIPHERAL VASCULAR CATHETERIZATION N/A 03/23/2016   Procedure: Visceral Angiography;  Surgeon: Renford Dills, MD;  Location: [facility];  Service: Cardiovascular;  Laterality: N/A;    Family History  Problem Relation Age of Onset  . Early death Mother   . Heart disease Mother   . Arthritis Father   . Asthma Father   . Stroke Sister    Allergies  Allergen Reactions  . Lortab [Hydrocodone-Acetaminophen] Itching, Swelling and Rash  . Doxycycline Diarrhea  . Macrobid [Nitrofurantoin Monohyd Macro] Diarrhea and Nausea And Vomiting  . Nitrofurantoin Diarrhea, Nausea Only and Nausea And Vomiting      Assessment & Plan:  Patient presents after two weeks in unna boots for a lymphedema exacerbation with weeping. The patient states the lymphedema pump company have contacted her and they will be delivering the pump soon. Her swelling and weeping have improved. She is looking forward to the pump. Denies any ulceration to her lower extremity. Denies any fever, nausea or vomiting.   1. Lymphedema - Stable Lymphedema pump should be shipped to the patient soon. This will be a great benefit to the patient. Continue compression and evaluation. Will see patient back in 2-3 weeks to check on progress with lymphedema pump Will order ABI to assess arterial symtom  2. Spinal stenosis, lumbar region, with neurogenic claudication - Stable Big contributing factor to the patients lower extremity pain  Current Outpatient Prescriptions on File Prior to Visit  Medication Sig Dispense Refill  . aspirin EC 81 MG EC tablet Take 1 tablet (81 mg total) by mouth daily. 180 tablet 2  . budesonide (ENTOCORT EC) 3 MG 24 hr capsule     . carvedilol (COREG) 25 MG tablet TAKE ONE TABLET BY MOUTH TWICE DAILY WITH  MEALS    . cephALEXin (KEFLEX) 500 MG capsule Take 1 capsule (500 mg total) by mouth every 12 (twelve) hours. 6 capsule 0  . citalopram (CELEXA) 10 MG tablet     . diphenoxylate-atropine (LOMOTIL) 2.5-0.025 MG tablet Take 1-2 tablets by mouth 4 (four) times daily as needed for diarrhea or loose stools. 40 tablet 0  . docusate sodium (COLACE) 100 MG capsule Take 1 capsule (100 mg total) by  mouth 2 (two) times daily as needed (take to keep stool soft.). 60 capsule 0  . furosemide (LASIX) 20 MG tablet Take 1 tablet (20 mg total) by mouth daily. (Patient taking differently: Take 20 mg by mouth daily as needed for fluid. ) 30 tablet 0  . gabapentin (NEURONTIN) 300 MG capsule Take 300 mg by mouth at bedtime.     Marland Kitchen guaiFENesin-dextromethorphan (ROBITUSSIN DM) 100-10 MG/5ML syrup Take 5 mLs by mouth every 4 (four) hours as needed for cough. 118 mL 0  . HYDROcodone-acetaminophen (NORCO/VICODIN) 5-325 MG tablet Limit one half to one tab by mouth per day or 2-3 times per day if tolerated 80 tablet 0  . levothyroxine (SYNTHROID, LEVOTHROID) 25 MCG tablet Take 25 mcg by mouth daily before breakfast.    .  loperamide (ANTI-DIARRHEAL) 2 MG capsule Take 2 mg by mouth as needed for diarrhea or loose stools.    Marland Kitchen losartan (COZAAR) 25 MG tablet TAKE ONE TABLET EVERY DAY    . nitroGLYCERIN (NITROSTAT) 0.4 MG SL tablet Place 0.4 mg under the tongue every 5 (five) minutes as needed for chest pain.     Marland Kitchen omeprazole (PRILOSEC) 20 MG capsule     . ondansetron (ZOFRAN) 4 MG tablet     . oseltamivir (TAMIFLU) 30 MG capsule Take 1 capsule (30 mg total) by mouth 2 (two) times daily. 8 capsule 0  . temazepam (RESTORIL) 7.5 MG capsule Take 7.5 mg by mouth at bedtime.     . vitamin B-12 (CYANOCOBALAMIN) 1000 MCG tablet Take 1,000 mcg by mouth daily.    Marland Kitchen warfarin (COUMADIN) 2.5 MG tablet Take 2.5 mg by mouth daily.      Current Facility-Administered Medications on File Prior to Visit  Medication Dose Route Frequency Provider Last Rate Last Dose  . mirabegron ER (MYRBETRIQ) tablet 25 mg  25 mg Oral Daily Hildred Laser, MD       There are no Patient Instructions on file for this visit. No Follow-up on file.  Joanna Roberts A Joanna Herrig, PA-C

## 2016-08-25 DIAGNOSIS — M5416 Radiculopathy, lumbar region: Secondary | ICD-10-CM | POA: Diagnosis not present

## 2016-08-25 DIAGNOSIS — M7061 Trochanteric bursitis, right hip: Secondary | ICD-10-CM | POA: Diagnosis not present

## 2016-08-25 DIAGNOSIS — M5136 Other intervertebral disc degeneration, lumbar region: Secondary | ICD-10-CM | POA: Diagnosis not present

## 2016-08-25 DIAGNOSIS — M4726 Other spondylosis with radiculopathy, lumbar region: Secondary | ICD-10-CM | POA: Diagnosis not present

## 2016-08-25 DIAGNOSIS — M48062 Spinal stenosis, lumbar region with neurogenic claudication: Secondary | ICD-10-CM | POA: Diagnosis not present

## 2016-08-26 ENCOUNTER — Ambulatory Visit (INDEPENDENT_AMBULATORY_CARE_PROVIDER_SITE_OTHER): Payer: PPO | Admitting: Vascular Surgery

## 2016-08-26 ENCOUNTER — Encounter (INDEPENDENT_AMBULATORY_CARE_PROVIDER_SITE_OTHER): Payer: PPO

## 2016-08-27 ENCOUNTER — Encounter
Admission: EM | Disposition: A | Discharge status: Home / Self Care | Payer: Self-pay | Source: Home / Self Care | Attending: Internal Medicine

## 2016-08-27 ENCOUNTER — Encounter: Payer: Self-pay | Admitting: Anesthesiology

## 2016-08-27 ENCOUNTER — Inpatient Hospital Stay
Admission: EM | Admit: 2016-08-27 | Discharge: 2016-08-29 | DRG: 556 | Disposition: A | Discharge status: Home / Self Care | Payer: PPO | Attending: Internal Medicine | Admitting: Internal Medicine

## 2016-08-27 ENCOUNTER — Emergency Department: Payer: PPO

## 2016-08-27 ENCOUNTER — Encounter: Payer: Self-pay | Admitting: Emergency Medicine

## 2016-08-27 DIAGNOSIS — I482 Chronic atrial fibrillation: Secondary | ICD-10-CM | POA: Diagnosis present

## 2016-08-27 DIAGNOSIS — S7011XA Contusion of right thigh, initial encounter: Secondary | ICD-10-CM | POA: Diagnosis not present

## 2016-08-27 DIAGNOSIS — Z66 Do not resuscitate: Secondary | ICD-10-CM | POA: Diagnosis not present

## 2016-08-27 DIAGNOSIS — D62 Acute posthemorrhagic anemia: Secondary | ICD-10-CM | POA: Diagnosis present

## 2016-08-27 DIAGNOSIS — I739 Peripheral vascular disease, unspecified: Secondary | ICD-10-CM | POA: Diagnosis not present

## 2016-08-27 DIAGNOSIS — M1990 Unspecified osteoarthritis, unspecified site: Secondary | ICD-10-CM | POA: Diagnosis not present

## 2016-08-27 DIAGNOSIS — M810 Age-related osteoporosis without current pathological fracture: Secondary | ICD-10-CM | POA: Diagnosis not present

## 2016-08-27 DIAGNOSIS — Z885 Allergy status to narcotic agent status: Secondary | ICD-10-CM

## 2016-08-27 DIAGNOSIS — M7989 Other specified soft tissue disorders: Secondary | ICD-10-CM | POA: Diagnosis not present

## 2016-08-27 DIAGNOSIS — K219 Gastro-esophageal reflux disease without esophagitis: Secondary | ICD-10-CM | POA: Diagnosis present

## 2016-08-27 DIAGNOSIS — Z881 Allergy status to other antibiotic agents status: Secondary | ICD-10-CM | POA: Diagnosis not present

## 2016-08-27 DIAGNOSIS — M7061 Trochanteric bursitis, right hip: Secondary | ICD-10-CM | POA: Diagnosis not present

## 2016-08-27 DIAGNOSIS — Z888 Allergy status to other drugs, medicaments and biological substances status: Secondary | ICD-10-CM

## 2016-08-27 DIAGNOSIS — M79604 Pain in right leg: Secondary | ICD-10-CM | POA: Diagnosis not present

## 2016-08-27 DIAGNOSIS — Z7901 Long term (current) use of anticoagulants: Secondary | ICD-10-CM

## 2016-08-27 DIAGNOSIS — T148XXA Other injury of unspecified body region, initial encounter: Secondary | ICD-10-CM | POA: Diagnosis not present

## 2016-08-27 DIAGNOSIS — D509 Iron deficiency anemia, unspecified: Secondary | ICD-10-CM | POA: Diagnosis present

## 2016-08-27 DIAGNOSIS — Z79891 Long term (current) use of opiate analgesic: Secondary | ICD-10-CM

## 2016-08-27 DIAGNOSIS — Z95810 Presence of automatic (implantable) cardiac defibrillator: Secondary | ICD-10-CM | POA: Diagnosis not present

## 2016-08-27 DIAGNOSIS — D649 Anemia, unspecified: Secondary | ICD-10-CM | POA: Diagnosis not present

## 2016-08-27 DIAGNOSIS — I252 Old myocardial infarction: Secondary | ICD-10-CM | POA: Diagnosis not present

## 2016-08-27 DIAGNOSIS — Z8249 Family history of ischemic heart disease and other diseases of the circulatory system: Secondary | ICD-10-CM

## 2016-08-27 DIAGNOSIS — M7981 Nontraumatic hematoma of soft tissue: Secondary | ICD-10-CM | POA: Diagnosis not present

## 2016-08-27 DIAGNOSIS — Z90710 Acquired absence of both cervix and uterus: Secondary | ICD-10-CM | POA: Diagnosis not present

## 2016-08-27 DIAGNOSIS — E039 Hypothyroidism, unspecified: Secondary | ICD-10-CM | POA: Diagnosis present

## 2016-08-27 DIAGNOSIS — D638 Anemia in other chronic diseases classified elsewhere: Secondary | ICD-10-CM | POA: Diagnosis not present

## 2016-08-27 DIAGNOSIS — I5042 Chronic combined systolic (congestive) and diastolic (congestive) heart failure: Secondary | ICD-10-CM | POA: Diagnosis present

## 2016-08-27 DIAGNOSIS — S7012XA Contusion of left thigh, initial encounter: Secondary | ICD-10-CM | POA: Diagnosis present

## 2016-08-27 DIAGNOSIS — E119 Type 2 diabetes mellitus without complications: Secondary | ICD-10-CM | POA: Diagnosis not present

## 2016-08-27 DIAGNOSIS — F329 Major depressive disorder, single episode, unspecified: Secondary | ICD-10-CM | POA: Diagnosis present

## 2016-08-27 DIAGNOSIS — R791 Abnormal coagulation profile: Secondary | ICD-10-CM | POA: Diagnosis not present

## 2016-08-27 DIAGNOSIS — I429 Cardiomyopathy, unspecified: Secondary | ICD-10-CM | POA: Diagnosis present

## 2016-08-27 DIAGNOSIS — Z79899 Other long term (current) drug therapy: Secondary | ICD-10-CM

## 2016-08-27 DIAGNOSIS — E860 Dehydration: Secondary | ICD-10-CM | POA: Diagnosis present

## 2016-08-27 DIAGNOSIS — I110 Hypertensive heart disease with heart failure: Secondary | ICD-10-CM | POA: Diagnosis present

## 2016-08-27 DIAGNOSIS — R079 Chest pain, unspecified: Secondary | ICD-10-CM | POA: Diagnosis not present

## 2016-08-27 DIAGNOSIS — I4891 Unspecified atrial fibrillation: Secondary | ICD-10-CM | POA: Diagnosis not present

## 2016-08-27 DIAGNOSIS — Z7982 Long term (current) use of aspirin: Secondary | ICD-10-CM

## 2016-08-27 DIAGNOSIS — I10 Essential (primary) hypertension: Secondary | ICD-10-CM | POA: Diagnosis not present

## 2016-08-27 LAB — CBC
HEMATOCRIT: 27 % — AB (ref 35.0–47.0)
Hemoglobin: 8.7 g/dL — ABNORMAL LOW (ref 12.0–16.0)
MCH: 30.2 pg (ref 26.0–34.0)
MCHC: 32.3 g/dL (ref 32.0–36.0)
MCV: 93.4 fL (ref 80.0–100.0)
Platelets: 159 10*3/uL (ref 150–440)
RBC: 2.89 MIL/uL — ABNORMAL LOW (ref 3.80–5.20)
RDW: 15.4 % — AB (ref 11.5–14.5)
WBC: 9.8 10*3/uL (ref 3.6–11.0)

## 2016-08-27 LAB — PROTIME-INR
INR: 3.77
INR: 1.54
Prothrombin Time: 38.2 seconds — ABNORMAL HIGH (ref 11.4–15.2)
Prothrombin Time: 18.6 seconds — ABNORMAL HIGH (ref 11.4–15.2)

## 2016-08-27 LAB — BASIC METABOLIC PANEL
ANION GAP: 5 (ref 5–15)
BUN: 31 mg/dL — AB (ref 6–20)
CO2: 24 mmol/L (ref 22–32)
Calcium: 8.9 mg/dL (ref 8.9–10.3)
Chloride: 108 mmol/L (ref 101–111)
Creatinine, Ser: 0.94 mg/dL (ref 0.44–1.00)
GFR calc Af Amer: >60 mL/min (ref >60)
GFR calc non Af Amer: 54 mL/min — ABNORMAL LOW (ref >60)
GLUCOSE: 137 mg/dL — AB (ref 65–99)
POTASSIUM: 4.5 mmol/L (ref 3.5–5.1)
Sodium: 137 mmol/L (ref 135–145)

## 2016-08-27 LAB — TROPONIN I: Troponin I: <0.03 ng/mL (ref <0.03)

## 2016-08-27 LAB — APTT: aPTT: 38 seconds — ABNORMAL HIGH (ref 24–36)

## 2016-08-27 SURGERY — FASCIOTOMY, HIP
Anesthesia: Choice | Laterality: Right

## 2016-08-27 MED ORDER — PANTOPRAZOLE SODIUM 40 MG PO TBEC: 40.0000 mg | DELAYED_RELEASE_TABLET | Freq: Every day | ORAL | Status: DC

## 2016-08-27 MED ORDER — ONDANSETRON HCL 4 MG PO TABS: 4.0000 mg | ORAL_TABLET | Freq: Four times a day (QID) | ORAL | Status: DC | PRN

## 2016-08-27 MED ORDER — NITROGLYCERIN 0.4 MG SL SUBL
0.4000 mg | SUBLINGUAL_TABLET | SUBLINGUAL | Status: DC | PRN
  Administered 2016-08-29 (×3): 0.4 mg via SUBLINGUAL
  Filled 2016-08-27: qty 1

## 2016-08-27 MED ORDER — LOSARTAN POTASSIUM 50 MG PO TABS
25.0000 mg | ORAL_TABLET | Freq: Every day | ORAL | Status: DC
  Administered 2016-08-28 – 2016-08-29 (×2): 25 mg via ORAL
  Filled 2016-08-27 (×2): qty 1

## 2016-08-27 MED ORDER — MIRABEGRON ER 25 MG PO TB24
25.0000 mg | ORAL_TABLET | Freq: Every day | ORAL | Status: DC
  Administered 2016-08-27 – 2016-08-29 (×3): 25 mg via ORAL
  Filled 2016-08-27 (×3): qty 1

## 2016-08-27 MED ORDER — CITALOPRAM HYDROBROMIDE 20 MG PO TABS
10.0000 mg | ORAL_TABLET | Freq: Every day | ORAL | Status: DC
Start: 2016-08-27 — End: 2016-08-27

## 2016-08-27 MED ORDER — EMPTY CONTAINERS FLEXIBLE MISC
1500.0000 [IU] | Status: AC
  Administered 2016-08-27: 1500 [IU] via INTRAVENOUS
  Filled 2016-08-27: qty 60

## 2016-08-27 MED ORDER — ALBUTEROL SULFATE (2.5 MG/3ML) 0.083% IN NEBU: 2.5000 mg | INHALATION_SOLUTION | RESPIRATORY_TRACT | Status: DC | PRN

## 2016-08-27 MED ORDER — HYDROMORPHONE HCL 1 MG/ML IJ SOLN
0.5000 mg | INTRAMUSCULAR | Status: AC
  Administered 2016-08-27: 0.5 mg via INTRAVENOUS
  Filled 2016-08-27: qty 1

## 2016-08-27 MED ORDER — BUDESONIDE 3 MG PO CPEP: 3.0000 mg | ORAL_CAPSULE | Freq: Every day | ORAL | Status: DC

## 2016-08-27 MED ORDER — IOPAMIDOL (ISOVUE-370) INJECTION 76%
100.0000 mL | Freq: Once | INTRAVENOUS | Status: AC | PRN
  Administered 2016-08-27: 100 mL via INTRAVENOUS

## 2016-08-27 MED ORDER — TEMAZEPAM 7.5 MG PO CAPS
7.5000 mg | ORAL_CAPSULE | Freq: Every day | ORAL | Status: DC
  Administered 2016-08-27 – 2016-08-28 (×2): 7.5 mg via ORAL
  Filled 2016-08-27 (×3): qty 1

## 2016-08-27 MED ORDER — ONDANSETRON HCL 4 MG/2ML IJ SOLN: 4.0000 mg | Freq: Four times a day (QID) | INTRAMUSCULAR | Status: DC | PRN

## 2016-08-27 MED ORDER — CARVEDILOL 25 MG PO TABS
25.0000 mg | ORAL_TABLET | Freq: Two times a day (BID) | ORAL | Status: DC
  Administered 2016-08-28 – 2016-08-29 (×3): 25 mg via ORAL
  Filled 2016-08-27 (×3): qty 1

## 2016-08-27 MED ORDER — LOPERAMIDE HCL 2 MG PO CAPS
2.0000 mg | ORAL_CAPSULE | ORAL | Status: DC | PRN
Start: 2016-08-27 — End: 2016-08-29

## 2016-08-27 MED ORDER — BISACODYL 5 MG PO TBEC: 5.0000 mg | DELAYED_RELEASE_TABLET | Freq: Every day | ORAL | Status: DC | PRN

## 2016-08-27 MED ORDER — SODIUM CHLORIDE 0.9 % IV SOLN
INTRAVENOUS | Status: DC
  Administered 2016-08-27: 19:00:00 via INTRAVENOUS

## 2016-08-27 MED ORDER — LEVOTHYROXINE SODIUM 25 MCG PO TABS
25.0000 ug | ORAL_TABLET | Freq: Every day | ORAL | Status: DC
  Administered 2016-08-28 – 2016-08-29 (×2): 25 ug via ORAL
  Filled 2016-08-27 (×2): qty 1

## 2016-08-27 MED ORDER — SENNOSIDES-DOCUSATE SODIUM 8.6-50 MG PO TABS
1.0000 | ORAL_TABLET | Freq: Every evening | ORAL | Status: DC | PRN
  Administered 2016-08-28: 1 via ORAL
  Filled 2016-08-27: qty 1

## 2016-08-27 MED ORDER — HYDROCODONE-ACETAMINOPHEN 5-325 MG PO TABS
1.0000 | ORAL_TABLET | Freq: Four times a day (QID) | ORAL | Status: DC | PRN
  Administered 2016-08-27 – 2016-08-29 (×2): 1 via ORAL
  Filled 2016-08-27 (×2): qty 1

## 2016-08-27 MED ORDER — GABAPENTIN 300 MG PO CAPS
300.0000 mg | ORAL_CAPSULE | Freq: Every day | ORAL | Status: DC
  Administered 2016-08-27 – 2016-08-28 (×2): 300 mg via ORAL
  Filled 2016-08-27 (×2): qty 1

## 2016-08-27 MED ORDER — VITAMIN K1 10 MG/ML IJ SOLN
10.0000 mg | INTRAVENOUS | Status: AC
  Administered 2016-08-27: 10 mg via INTRAVENOUS
  Filled 2016-08-27: qty 1

## 2016-08-27 NOTE — Consult Note (Signed)
ORTHOPAEDIC CONSULTATION  REQUESTING PHYSICIAN: Delman Kitten, MD  Chief Complaint:   Right thigh pain.  History of Present Illness: Joanna Roberts is an 83 y.o. female with multiple medical problems who lives in an assisted living facility. She was in her usual state of health until approximately 12 or 12:30 when she began to develop increased pain in her anterior thigh. The pain became so severe that she was unable to ambulate so she was brought to the emergency room. I last evaluated her on an urgent basis for possible anterior thigh compartment syndrome. She denies any specific injury to the thigh or leg, but did undergo a steroid injection for right trochanteric bursitis 2 days ago which she felt was quite beneficial as most of her lateral sided right hip pain was resolved. The patient also has a history of peripheral vascular disease and is followed by Dr. Delana Meyer for this. Finally, she is on Coumadin for chronic atrial fibrillation. Her INR in the emergency room is 3.77.  Past Medical History:  Diagnosis Date  . AICD (automatic cardioverter/defibrillator) present   . Allergy   . Anemia   . Arthritis   . Cataract   . CHF (congestive heart failure) ([place])   . Depression   . Dysrhythmia   . GERD (gastroesophageal reflux disease)   . Heart murmur   . Hypertension   . Myocardial infarction ([place])   . Osteoporosis   . Presence of permanent cardiac pacemaker   . Thyroid disease    Past Surgical History:  Procedure Laterality Date  . ABDOMINAL HYSTERECTOMY    . APPENDECTOMY    . HERNIA REPAIR    . INSERT / REPLACE / REMOVE PACEMAKER    . PERIPHERAL VASCULAR CATHETERIZATION Right 10/08/2014   Procedure: Lower Extremity Angiography;  Surgeon: Katha Cabal, MD;  Location: [place] [facility];  Service: Cardiovascular;  Laterality: Right;  . PERIPHERAL VASCULAR CATHETERIZATION  10/08/2014   Procedure: Lower Extremity  Intervention;  Surgeon: Katha Cabal, MD;  Location: [place] [facility];  Service: Cardiovascular;;  . PERIPHERAL VASCULAR CATHETERIZATION N/A 03/23/2016   Procedure: Visceral Angiography;  Surgeon: Katha Cabal, MD;  Location: [place] [facility];  Service: Cardiovascular;  Laterality: N/A;   Social History   Social History  . Marital status: Widowed    Spouse name: N/A  . Number of children: N/A  . Years of education: N/A   Social History Main Topics  . Smoking status: Never Smoker  . Smokeless tobacco: Never Used  . Alcohol use No  . Drug use: No  . Sexual activity: Not Currently    Birth control/ protection: Post-menopausal   Other Topics Concern  . None   Social History Narrative  . None   Family History  Problem Relation Age of Onset  . Early death Mother   . Heart disease Mother   . Arthritis Father   . Asthma Father   . Stroke Sister    Allergies  Allergen Reactions  . Lortab [Hydrocodone-Acetaminophen] Itching, Swelling and Rash  . Doxycycline Diarrhea  . Macrobid [Nitrofurantoin Monohyd Macro] Diarrhea and Nausea And Vomiting  . Nitrofurantoin Diarrhea, Nausea Only and Nausea And Vomiting   Prior to Admission medications   Medication Sig Start Date End Date Taking? Authorizing Provider  aspirin EC 81 MG EC tablet Take 1 tablet (81 mg total) by mouth daily. 03/25/16   Katha Cabal, MD  budesonide (ENTOCORT EC) 3 MG 24 hr capsule  04/14/16  Historical Provider, MD  carvedilol (COREG) 25 MG tablet TAKE ONE TABLET BY MOUTH TWICE DAILY WITH  MEALS 06/26/15   Historical Provider, MD  citalopram (CELEXA) 10 MG tablet  07/13/16   Historical Provider, MD  diphenoxylate-atropine (LOMOTIL) 2.5-0.025 MG tablet Take 1-2 tablets by mouth 4 (four) times daily as needed for diarrhea or loose stools. 04/08/16   Katha Cabal, MD  docusate sodium (COLACE) 100 MG capsule Take 1 capsule (100 mg total) by mouth 2 (two) times daily as needed (take to keep stool  soft.). 09/17/15   Hollice Espy, MD  furosemide (LASIX) 20 MG tablet Take 1 tablet (20 mg total) by mouth daily. Patient taking differently: Take 20 mg by mouth daily as needed for fluid.  03/18/15   Srikar Sudini, MD  gabapentin (NEURONTIN) 300 MG capsule Take 300 mg by mouth at bedtime.  03/16/16   Historical Provider, MD  guaiFENesin-dextromethorphan (ROBITUSSIN DM) 100-10 MG/5ML syrup Take 5 mLs by mouth every 4 (four) hours as needed for cough. 06/03/16   Fritzi Mandes, MD  HYDROcodone-acetaminophen (NORCO/VICODIN) 5-325 MG tablet Limit one half to one tab by mouth per day or 2-3 times per day if tolerated 12/09/15   Mohammed Kindle, MD  levothyroxine (SYNTHROID, LEVOTHROID) 25 MCG tablet Take 25 mcg by mouth daily before breakfast.    Historical Provider, MD  loperamide (ANTI-DIARRHEAL) 2 MG capsule Take 2 mg by mouth as needed for diarrhea or loose stools.    Historical Provider, MD  losartan (COZAAR) 25 MG tablet TAKE ONE TABLET EVERY DAY 07/30/15   Historical Provider, MD  nitroGLYCERIN (NITROSTAT) 0.4 MG SL tablet Place 0.4 mg under the tongue every 5 (five) minutes as needed for chest pain.     Historical Provider, MD  omeprazole (PRILOSEC) 20 MG capsule  04/02/16   Historical Provider, MD  ondansetron (ZOFRAN) 4 MG tablet  03/30/16   Historical Provider, MD  silver sulfADIAZINE (SILVADENE) 1 % cream Apply 1 application topically 2 (two) times daily. Apply to area twice a day 08/24/16   Janalyn Harder Stegmayer, PA-C  temazepam (RESTORIL) 7.5 MG capsule Take 7.5 mg by mouth at bedtime.  03/17/16   Historical Provider, MD  vitamin B-12 (CYANOCOBALAMIN) 1000 MCG tablet Take 1,000 mcg by mouth daily.    Historical Provider, MD  warfarin (COUMADIN) 2.5 MG tablet Take 2.5 mg by mouth daily.     Historical Provider, MD   Dg Chest 2 View  Result Date: 08/27/2016 CLINICAL DATA:  Chest pain EXAM: CHEST  2 VIEW COMPARISON:  06/01/2016 FINDINGS: Chronic cardiomegaly. There is a biventricular ICD/ pacer from the  left with 2 right ventricular leads. There is no edema, consolidation, effusion, or pneumothorax. Kyphosis with chronic compression fracture at T12. IMPRESSION: 1. No evidence of active disease. 2. Cardiomegaly. Electronically Signed   By: Monte Fantasia M.D.   On: 08/27/2016 15:46    Positive ROS: All other systems have been reviewed and were otherwise negative with the exception of those mentioned in the HPI and as above.  Physical Exam: General:  Alert, no acute distress Psychiatric:  Patient is competent for consent with normal mood and affect   Cardiovascular:  No pedal edema Respiratory:  No wheezing, non-labored breathing GI:  Abdomen is soft and non-tender Skin:  No lesions in the area of chief complaint Neurologic:  Sensation intact distally Lymphatic:  No axillary or cervical lymphadenopathy  Orthopedic Exam:  Orthopedic examination is limited to the right hip and lower extremity. Skin inspection was  notable for moderate swelling involving the anterior aspect of the thigh without any evidence for ecchymosis, erythema, or other skin abnormalities. There is tense swelling throughout the anterior thigh and pain to light palpation. She has pain with any attempted active or passive motion of the hip or knee. She is neurovascularly intact to the right lower extremity and foot.  X-rays:  An angio-CT scan of the right thigh has been performed and is available for review. The scan demonstrates an intramuscular hematoma involving the anterior thigh without any discrete fluid collection. No fractures or large vessel bleeding sites are noted.  Assessment: Right thigh hematoma.  Plan: After obtaining verbal consent, a compartment pressure reading was made using the [employer] compartment pressure kit. The anterior thigh compartment pressure measured 23 mm Hg. This is below the threshold concerning for acute compartment syndrome. Therefore, I feel that this patient can be managed with observation  at this time. Regardless, the patient should be admitted for close observation and her coagulation status should be reversed as quickly as possible.  Thank you for asking me to participate in the care of this delightful woman. I will be happy to follow her closely with you.   Pascal Lux, MD  Beeper #:  [phone]  08/27/2016 5:13 PM

## 2016-08-27 NOTE — ED Triage Notes (Addendum)
Pt comes into the ED via EMS from [facility] c/o right leg pain after a steroid shot was placed in the leg on Tuesday.  Patient has swelling noted to the leg and weakness since the shot.  Patient further explains that she has an extensive cardiac history and had chest pain this morning that was resolved with her nitroglycerin.  Patient denies any chest pain at this time.

## 2016-08-27 NOTE — ED Notes (Signed)
Pt last ate or drank at 1200 today.

## 2016-08-27 NOTE — ED Notes (Signed)
Patient transported to CT 

## 2016-08-27 NOTE — ED Notes (Signed)
All of belongings sent with patient.

## 2016-08-27 NOTE — H&P (Addendum)
[facility] -  at [facility]   PATIENT NAME: Joanna Roberts    MR#:  [id number]  DATE OF BIRTH:  July 31, 1928  DATE OF ADMISSION:  08/27/2016  PRIMARY CARE PHYSICIAN: Patrice Paradise, MD   REQUESTING/REFERRING PHYSICIAN: Dr. Fanny Bien.  CHIEF COMPLAINT:   Chief Complaint  Patient presents with  . Leg Pain   Right leg pain. HISTORY OF PRESENT ILLNESS:  Joanna Roberts  is a 83 y.o. female with a known history of Hypertension, CHF, A. fib, status post AICD, anemia and arthritis. The patient was sent from assisted living facility due to above chief complaints. The patient was fine until about lunchtime when she developed worsening swelling and pain in her right thigh. The patient also complains for chest pain this morning, which is on the left side, aching without radiation, lasted about 5 minutes. denies any fever or chills or shortness of breath. The patient is taking Coumadin for chronic A. fib and was found elevated INR at 3.77. She got a steroid injection for right trochanteric bursitis 2 days ago. CAT scan of the right side showed hepatoma. The patient also complains of bruises over the body for the past few days.  PAST MEDICAL HISTORY:   Past Medical History:  Diagnosis Date  . AICD (automatic cardioverter/defibrillator) present   . Allergy   . Anemia   . Arthritis   . Cataract   . CHF (congestive heart failure) (HCC)   . Depression   . Dysrhythmia   . GERD (gastroesophageal reflux disease)   . Heart murmur   . Hypertension   . Myocardial infarction (HCC)   . Osteoporosis   . Presence of permanent cardiac pacemaker   . Thyroid disease     PAST SURGICAL HISTORY:   Past Surgical History:  Procedure Laterality Date  . ABDOMINAL HYSTERECTOMY    . APPENDECTOMY    . HERNIA REPAIR    . INSERT / REPLACE / REMOVE PACEMAKER    . PERIPHERAL VASCULAR CATHETERIZATION Right 10/08/2014   Procedure: Lower Extremity Angiography;  Surgeon: Renford Dills, MD;   Location: [facility] INVASIVE CV LAB;  Service: Cardiovascular;  Laterality: Right;  . PERIPHERAL VASCULAR CATHETERIZATION  10/08/2014   Procedure: Lower Extremity Intervention;  Surgeon: Renford Dills, MD;  Location: [facility] INVASIVE CV LAB;  Service: Cardiovascular;;  . PERIPHERAL VASCULAR CATHETERIZATION N/A 03/23/2016   Procedure: Visceral Angiography;  Surgeon: Renford Dills, MD;  Location: [facility] INVASIVE CV LAB;  Service: Cardiovascular;  Laterality: N/A;    SOCIAL HISTORY:   Social History  Substance Use Topics  . Smoking status: Never Smoker  . Smokeless tobacco: Never Used  . Alcohol use No    FAMILY HISTORY:   Family History  Problem Relation Age of Onset  . Early death Mother   . Heart disease Mother   . Arthritis Father   . Asthma Father   . Stroke Sister     DRUG ALLERGIES:   Allergies  Allergen Reactions  . Lortab [Hydrocodone-Acetaminophen] Itching, Swelling and Rash  . Doxycycline Diarrhea  . Macrobid [Nitrofurantoin Monohyd Macro] Diarrhea and Nausea And Vomiting  . Nitrofurantoin Diarrhea, Nausea Only and Nausea And Vomiting    REVIEW OF SYSTEMS:   Review of Systems  Constitutional: Positive for malaise/fatigue. Negative for chills, diaphoresis and fever.  HENT: Negative for congestion and sore throat.   Eyes: Negative for blurred vision and double vision.  Respiratory: Negative for cough, hemoptysis, shortness of breath, wheezing and stridor.   Cardiovascular:  Positive for chest pain and leg swelling. Negative for palpitations.  Gastrointestinal: Negative for abdominal pain, blood in stool, constipation, diarrhea, melena, nausea and vomiting.  Genitourinary: Negative for dysuria and hematuria.  Musculoskeletal: Positive for joint pain. Negative for back pain.  Neurological: Positive for weakness. Negative for dizziness, focal weakness, loss of consciousness and headaches.  Endo/Heme/Allergies: Bruises/bleeds easily.  Psychiatric/Behavioral: Negative  for depression. The patient is not nervous/anxious.     MEDICATIONS AT HOME:   Prior to Admission medications   Medication Sig Start Date End Date Taking? Authorizing Provider  aspirin EC 81 MG EC tablet Take 1 tablet (81 mg total) by mouth daily. 03/25/16  Yes Schnier, Latina Craver, MD  carvedilol (COREG) 25 MG tablet TAKE ONE TABLET BY MOUTH TWICE DAILY WITH  MEALS 06/26/15  Yes [provider]  diphenoxylate-atropine (LOMOTIL) 2.5-0.025 MG tablet Take 1-2 tablets by mouth 4 (four) times daily as needed for diarrhea or loose stools. 04/08/16  Yes Schnier, Latina Craver, MD  furosemide (LASIX) 20 MG tablet Take 1 tablet (20 mg total) by mouth daily. Patient taking differently: Take 20 mg by mouth daily as needed for fluid.  03/18/15  Yes Sudini, Wardell Heath, MD  gabapentin (NEURONTIN) 300 MG capsule Take 300 mg by mouth at bedtime.  03/16/16  Yes [provider]  HYDROcodone-acetaminophen (NORCO/VICODIN) 5-325 MG tablet Limit one half to one tab by mouth per day or 2-3 times per day if tolerated 12/09/15  Yes Ewing Schlein, MD  levothyroxine (SYNTHROID, LEVOTHROID) 25 MCG tablet Take 25 mcg by mouth daily before breakfast.   Yes [provider]  loperamide (ANTI-DIARRHEAL) 2 MG capsule Take 2 mg by mouth as needed for diarrhea or loose stools.   Yes [provider]  losartan (COZAAR) 25 MG tablet TAKE ONE TABLET EVERY DAY 07/30/15  Yes [provider]  nitroGLYCERIN (NITROSTAT) 0.4 MG SL tablet Place 0.4 mg under the tongue every 5 (five) minutes as needed for chest pain.    Yes [provider]  ondansetron (ZOFRAN) 4 MG tablet Take 4 mg by mouth every 8 (eight) hours as needed.  03/30/16  Yes [provider]  silver sulfADIAZINE (SILVADENE) 1 % cream Apply 1 application topically 2 (two) times daily. Apply to area twice a day 08/24/16  Yes Stegmayer, Cala Bradford A, PA-C  temazepam (RESTORIL) 7.5 MG capsule Take 7.5 mg by mouth at bedtime.  03/17/16  Yes  [provider]  vitamin B-12 (CYANOCOBALAMIN) 1000 MCG tablet Take 1,000 mcg by mouth daily.   Yes [provider]  warfarin (COUMADIN) 2.5 MG tablet Take 2.5 mg by mouth daily.    Yes [provider]  budesonide (ENTOCORT EC) 3 MG 24 hr capsule Take 3 mg by mouth daily.  04/14/16   [provider]  docusate sodium (COLACE) 100 MG capsule Take 1 capsule (100 mg total) by mouth 2 (two) times daily as needed (take to keep stool soft.). Patient not taking: Reported on 08/27/2016 09/17/15   Vanna Scotland, MD      VITAL SIGNS:  Blood pressure (!) 155/101, pulse 74, temperature 98.4 F (36.9 C), temperature source Oral, resp. rate (!) 28, height 5\' 1"  (1.549 m), weight 135 lb (61.2 kg), SpO2 99 %.  PHYSICAL EXAMINATION:  Physical Exam  GENERAL:  83 y.o.-year-old patient lying in the bed with no acute distress.  EYES: Pupils equal, round, reactive to light and accommodation. No scleral icterus. Extraocular muscles intact.  HEENT: Head atraumatic, normocephalic. Oropharynx and nasopharynx clear.  NECK:  Supple, no jugular venous distention. No thyroid enlargement, no tenderness.  LUNGS: Normal breath sounds bilaterally, no wheezing, rales,rhonchi or crepitation. No use of accessory muscles of respiration.  CARDIOVASCULAR: S1, S2 normal. 3/6 systolic murmurs, no rubs, or gallops.  ABDOMEN: Soft, nontender, nondistended. Bowel sounds present. No organomegaly or mass.  EXTREMITIES: No cyanosis, or clubbing. Right side swelling and tenderness. Right leg edema. No calf tenderness. NEUROLOGIC: Cranial nerves II through XII are intact. Muscle strength 5/5 in all extremities. Sensation intact. Gait not checked.  PSYCHIATRIC: The patient is alert and oriented x 3.  SKIN: No obvious rash, lesion, or ulcer. But has bruises.  LABORATORY PANEL:   CBC  Recent Labs Lab 08/27/16 1511  WBC 9.8  HGB 8.7*  HCT 27.0*  PLT 159    ------------------------------------------------------------------------------------------------------------------  Chemistries   Recent Labs Lab 08/27/16 1511  NA 137  K 4.5  CL 108  CO2 24  GLUCOSE 137*  BUN 31*  CREATININE 0.94  CALCIUM 8.9   ------------------------------------------------------------------------------------------------------------------  Cardiac Enzymes  Recent Labs Lab 08/27/16 1511  TROPONINI <0.03   ------------------------------------------------------------------------------------------------------------------  RADIOLOGY:  Dg Chest 2 View  Result Date: 08/27/2016 CLINICAL DATA:  Chest pain EXAM: CHEST  2 VIEW COMPARISON:  06/01/2016 FINDINGS: Chronic cardiomegaly. There is a biventricular ICD/ pacer from the left with 2 right ventricular leads. There is no edema, consolidation, effusion, or pneumothorax. Kyphosis with chronic compression fracture at T12. IMPRESSION: 1. No evidence of active disease. 2. Cardiomegaly. Electronically Signed   By: Marnee Spring M.D.   On: 08/27/2016 15:46   Ct Angio Low Extrem Right W &/or Wo Contrast  Result Date: 08/27/2016 CLINICAL DATA:  Right lower extremity pain and swelling after recent steroid injection. EXAM: CT ANGIOGRAPHY OF THE RIGHT LOWER EXTREMITY TECHNIQUE: Multidetector CT imaging of the right/left upper/lowerwas performed using the standard protocol during bolus administration of intravenous contrast. Multiplanar CT image reconstructions and MIPs were obtained to evaluate the vascular anatomy. CONTRAST:  100 mL Isovue 370 IV COMPARISON:  None. FINDINGS: The dominant finding is a large intramuscular hematoma within the quadriceps musculature and primarily centered within the vastus intermedius and lateralis muscles. The hematoma begins in the proximal thigh just below the hip joint and continues into the distal thigh. No soft tissue gas identified. Arterial supply to the right lower extremity is intact and  there is no evidence of arterial injury or arterial contrast extravasation. Scattered calcified plaque is noted in the common femoral and superficial femoral arteries without evidence of significant obstructive disease. Calcified plaque is present in the popliteal artery without significant stenosis. Underlying bony structures are intact and without evidence of fracture or lesion. No soft tissue foreign body is identified. Review of the MIP images confirms the above findings. IMPRESSION: Large right thigh intramuscular hematoma of the quadriceps musculature primarily centered within the vastus intermedius and lateralis muscles and extending from the proximal thigh to distal thigh. No arterial injury or contrast extravasation identified. Electronically Signed   By: Irish Lack M.D.   On: 08/27/2016 17:44      IMPRESSION AND PLAN:   Right thigh hematoma, The patient will be admitted to medical floor. The patient was given vitamin K IV in the ED, follow-up INR, hold Coumadin and aspirin. Per Dr. Joice Lofts,  the anterior thigh compartment pressure measured 23 mm Hg. This is below the threshold concerning for acute compartment syndrome. no indication for surgery, Conservative management. Pain control, follow-up with Dr. Joice Lofts.  Anemia of chronic disease and  acute blood loss due to above. Follow-up hemoglobin.  Dehydration. Start normal saline IV and follow-up BMP. Hold the Lasix.  Hypertension. Hold the Lasix. Continue Coreg. Chronic A. fib. Hold Coumadin, continue Coreg. Chronic CHF. Unknown type. Stable.  I discussed with Dr. Joice Lofts. All the records are reviewed and case discussed with ED provider. Management plans discussed with the patient, family and they are in agreement.  CODE STATUS: DO NOT RESUSCITATE.  TOTAL TIME TAKING CARE OF THIS PATIENT: 60 minutes.    Shaune Pollack M.D on 08/27/2016 at 6:28 PM  Between 7am to 6pm - Pager - [phone]  After 6pm go to [email or web address] - Geophysicist/field seismologist  [facility]  Office  [phone]  CC: Primary care physician; Patrice Paradise, MD   Note: This dictation was prepared with Dragon dictation along with smaller phrase technology. Any transcriptional errors that result from this process are unintentional.

## 2016-08-27 NOTE — Progress Notes (Signed)
Pt arrived from ED via stretcher at 1845, pt transferred to bed by staff, c/o pain with movement to R leg. Pt is alert and oriented x4, is able to relate series of events that led to admission. Pt is on room air, was toileted with bedpan for clear yellow urine, pt volunteered that in addition to her history on file, pt has a pessery that she had taken out and cleaned last wekk in doctor's office, and that this causes her to urinate slowly. Pt also stated that her hemorrhoids have been bleeding lately as well. piv #20 intact to l arm, iv viatmin K finished and site was saline locked. Pt positioned in bed by staff, skin is intact but pt has small circular bruise to her l hip, and varicose veins evident on r thigh and bilat lower legs/feet. R thigh is edematous in comparison to l thigh, also some edema below knee as well. Pt unable to move her leg without significant pain. Pt is oriented to room. Report given to [facility].

## 2016-08-27 NOTE — ED Notes (Signed)
Admitting MD at bedside.

## 2016-08-27 NOTE — Progress Notes (Addendum)
MEDICATION RELATED CONSULT NOTE   Pharmacy consulted for post Kcentra and INR monitoring in this 62 yoF on warfarin PTA for afib with possible anterior thigh compartment syndrome which would need surgical intervention. INR in the ER is 3.77. Pt has history of PVD.  Patient is to receive vitamin K 10 mg and Kcentra 1500 units (~25u/kg). Plan has been discussed with MD.  After CT, the anterior thigh compartment pressure is below the threshold for acute compartment syndrome. Per orthopedics, pt can be managed with observation at this time.  Plan: Will schedule INR draws 60 min post-Kcentra (25 min infusion started at 1800), then every 6 hours x 4 and then daily x 2 per consult. INRs have been ordered.  INR 5/4 1500 3.77 INR 5/4 1930 1.54 INR 5/5 0130 1.41   Allergies  Allergen Reactions  . Lortab [Hydrocodone-Acetaminophen] Itching, Swelling and Rash  . Doxycycline Diarrhea  . Macrobid [Nitrofurantoin Monohyd Macro] Diarrhea and Nausea And Vomiting  . Nitrofurantoin Diarrhea, Nausea Only and Nausea And Vomiting    Patient Measurements: Height: 5\' 1"  (154.9 cm) Weight: 135 lb (61.2 kg) IBW/kg (Calculated) : 47.8  Vital Signs: Temp: 98.4 F (36.9 C) (05/04 1428) Temp Source: Oral (05/04 1428) BP: 165/92 (05/04 1625) Pulse Rate: 80 (05/04 1625) Intake/Output from previous day: No intake/output data recorded. Intake/Output from this shift: No intake/output data recorded.  Labs:  Recent Labs  08/27/16 1511  WBC 9.8  HGB 8.7*  HCT 27.0*  PLT 159  APTT 38*  CREATININE 0.94   Estimated Creatinine Clearance: 36.7 mL/min (by C-G formula based on SCr of 0.94 mg/dL).   Microbiology: No results found for this or any previous visit (from the past 720 hour(s)).  Medical History: Past Medical History:  Diagnosis Date  . AICD (automatic cardioverter/defibrillator) present   . Allergy   . Anemia   . Arthritis   . Cataract   . CHF (congestive heart failure) (HCC)   .  Depression   . Dysrhythmia   . GERD (gastroesophageal reflux disease)   . Heart murmur   . Hypertension   . Myocardial infarction (HCC)   . Osteoporosis   . Presence of permanent cardiac pacemaker   . Thyroid disease     Medications:  Scheduled:   Infusions:  . phytonadione (VITAMIN K) IV    . prothrombin complex conc human (Kcentra) IVPB      10/27/16, PharmD Pharmacy Resident 08/27/2016 5:38 PM

## 2016-08-27 NOTE — ED Notes (Signed)
Report to Julie, RN.

## 2016-08-27 NOTE — ED Notes (Signed)
Patient transported to X-ray 

## 2016-08-27 NOTE — ED Notes (Signed)
ED Provider at bedside. 

## 2016-08-28 LAB — CBC
HCT: 20.9 % — ABNORMAL LOW (ref 35.0–47.0)
HEMATOCRIT: 21.1 % — AB (ref 35.0–47.0)
HEMOGLOBIN: 6.6 g/dL — AB (ref 12.0–16.0)
HEMOGLOBIN: 6.6 g/dL — AB (ref 12.0–16.0)
MCH: 29.3 pg (ref 26.0–34.0)
MCH: 29.2 pg (ref 26.0–34.0)
MCHC: 31.5 g/dL — ABNORMAL LOW (ref 32.0–36.0)
MCHC: 31.2 g/dL — ABNORMAL LOW (ref 32.0–36.0)
MCV: 93.2 fL (ref 80.0–100.0)
MCV: 93.6 fL (ref 80.0–100.0)
PLATELETS: 141 10*3/uL — AB (ref 150–440)
Platelets: 146 10*3/uL — ABNORMAL LOW (ref 150–440)
RBC: 2.24 MIL/uL — AB (ref 3.80–5.20)
RBC: 2.25 MIL/uL — ABNORMAL LOW (ref 3.80–5.20)
RDW: 15.7 % — ABNORMAL HIGH (ref 11.5–14.5)
RDW: 15.5 % — AB (ref 11.5–14.5)
WBC: 12.1 10*3/uL — AB (ref 3.6–11.0)
WBC: 11.3 10*3/uL — ABNORMAL HIGH (ref 3.6–11.0)

## 2016-08-28 LAB — URINALYSIS, ROUTINE W REFLEX MICROSCOPIC
BACTERIA UA: NONE SEEN
Bilirubin Urine: NEGATIVE
GLUCOSE, UA: NEGATIVE mg/dL
HGB URINE DIPSTICK: NEGATIVE
Ketones, ur: NEGATIVE mg/dL
Nitrite: NEGATIVE
PH: 6 (ref 5.0–8.0)
Protein, ur: NEGATIVE mg/dL
SPECIFIC GRAVITY, URINE: 1.015 (ref 1.005–1.030)

## 2016-08-28 LAB — PROTIME-INR
INR: 1.41
INR: 1.27
INR: 1.15
INR: 1.17
PROTHROMBIN TIME: 17.4 s — AB (ref 11.4–15.2)
PROTHROMBIN TIME: 16 s — AB (ref 11.4–15.2)
Prothrombin Time: 14.8 seconds (ref 11.4–15.2)
Prothrombin Time: 15 seconds (ref 11.4–15.2)

## 2016-08-28 LAB — BASIC METABOLIC PANEL
ANION GAP: 4 — AB (ref 5–15)
BUN: 31 mg/dL — ABNORMAL HIGH (ref 6–20)
CALCIUM: 8.5 mg/dL — AB (ref 8.9–10.3)
CHLORIDE: 105 mmol/L (ref 101–111)
CO2: 25 mmol/L (ref 22–32)
CREATININE: 0.96 mg/dL (ref 0.44–1.00)
GFR calc Af Amer: >60 mL/min (ref >60)
GFR calc non Af Amer: 52 mL/min — ABNORMAL LOW (ref >60)
Glucose, Bld: 140 mg/dL — ABNORMAL HIGH (ref 65–99)
Potassium: 4.7 mmol/L (ref 3.5–5.1)
Sodium: 134 mmol/L — ABNORMAL LOW (ref 135–145)

## 2016-08-28 LAB — TROPONIN I
Troponin I: <0.03 ng/mL
Troponin I: <0.03 ng/mL (ref <0.03)

## 2016-08-28 LAB — ABO/RH: ABO/RH(D): O NEG

## 2016-08-28 LAB — HEMOGLOBIN AND HEMATOCRIT, BLOOD
HCT: 23.6 % — ABNORMAL LOW (ref 35.0–47.0)
Hemoglobin: 7.8 g/dL — ABNORMAL LOW (ref 12.0–16.0)

## 2016-08-28 LAB — PREPARE RBC (CROSSMATCH)

## 2016-08-28 MED ORDER — CIPROFLOXACIN HCL 500 MG PO TABS
500.0000 mg | ORAL_TABLET | Freq: Two times a day (BID) | ORAL | Status: DC
  Administered 2016-08-28 – 2016-08-29 (×2): 500 mg via ORAL
  Filled 2016-08-28 (×2): qty 1

## 2016-08-28 MED ORDER — PHENAZOPYRIDINE HCL 100 MG PO TABS: 100.0000 mg | ORAL_TABLET | Freq: Three times a day (TID) | ORAL | Status: DC

## 2016-08-28 MED ORDER — SODIUM CHLORIDE 0.9 % IV SOLN
Freq: Once | INTRAVENOUS | Status: AC
  Administered 2016-08-28: 12:00:00 via INTRAVENOUS

## 2016-08-28 MED ORDER — FUROSEMIDE 10 MG/ML IJ SOLN
20.0000 mg | Freq: Once | INTRAMUSCULAR | Status: AC
  Administered 2016-08-28: 20 mg via INTRAVENOUS
  Filled 2016-08-28: qty 2

## 2016-08-28 MED ORDER — IBUPROFEN 400 MG PO TABS
200.0000 mg | ORAL_TABLET | Freq: Three times a day (TID) | ORAL | Status: DC | PRN
  Administered 2016-08-28: 200 mg via ORAL
  Filled 2016-08-28: qty 1

## 2016-08-28 NOTE — Progress Notes (Signed)
Patients HGB 6.6 MD Poggi called and said to redo CBC at 0800 and will recheck.

## 2016-08-28 NOTE — Progress Notes (Signed)
PT Cancellation Note  Patient Details Name: Joanna Roberts MRN: [id number] DOB: 1929-02-27   Cancelled Treatment:    Reason Eval/Treat Not Completed: Medical issues which prohibited therapy. Chart reviewed, RN consulted. Pt with noted Hb: 6.6, HCT: 21.1, INR: 3.77->1.27. Evaluation visit attempted however pt is currently receiving blood transfusion. Will reattempt PT eval at later date/time as appropriate.   1:47 PM, 08/28/16 [name], PT, DPT Physical Therapist - [place] [phone] (mobile)    [name] C 08/28/2016, 1:46 PM

## 2016-08-28 NOTE — Progress Notes (Signed)
MEDICATION RELATED CONSULT NOTE   Pharmacy consulted for post Kcentra and INR monitoring in this 37 yoF on warfarin PTA for afib with possible anterior thigh compartment syndrome which would need surgical intervention. INR in the ER is 3.77. Pt has history of PVD.  Patient is to receive vitamin K 10 mg and Kcentra 1500 units (~25u/kg). Plan has been discussed with MD.  After CT, the anterior thigh compartment pressure is below the threshold for acute compartment syndrome. Per orthopedics, pt can be managed with observation at this time.  Plan: Will schedule INR draws 60 min post-Kcentra (25 min infusion started at 1800), then every 6 hours x 4 and then daily x 2 per consult. INRs have been ordered.  INR 5/4 1500 3.77 INR 5/4 1930 1.54 INR 5/5 0130 1.41 INR 5/5 0730  1.27     Allergies  Allergen Reactions  . Lortab [Hydrocodone-Acetaminophen] Itching, Swelling and Rash  . Doxycycline Diarrhea  . Macrobid [Nitrofurantoin Monohyd Macro] Diarrhea and Nausea And Vomiting  . Nitrofurantoin Diarrhea, Nausea Only and Nausea And Vomiting    Patient Measurements: Height: 5\' 1"  (154.9 cm) Weight: 135 lb (61.2 kg) IBW/kg (Calculated) : 47.8  Vital Signs: Temp: 97.9 F (36.6 C) (05/05 0725) Temp Source: Oral (05/05 0725) BP: 142/67 (05/05 0725) Pulse Rate: 80 (05/05 0725) Intake/Output from previous day: 05/04 0701 - 05/05 0700 In: 60 [IV Piggyback:60] Out: -  Intake/Output from this shift: Total I/O In: -  Out: 200 [Urine:200]  Labs:  Recent Labs  08/27/16 1511 08/28/16 0140 08/28/16 0725  WBC 9.8 12.1* 11.3*  HGB 8.7* 6.6* 6.6*  HCT 27.0* 20.9* 21.1*  PLT 159 141* 146*  APTT 38*  --   --   CREATININE 0.94 0.96  --    Estimated Creatinine Clearance: 36 mL/min (by C-G formula based on SCr of 0.96 mg/dL).   Microbiology: No results found for this or any previous visit (from the past 720 hour(s)).  Medical History: Past Medical History:  Diagnosis Date  . AICD  (automatic cardioverter/defibrillator) present   . Allergy   . Anemia   . Arthritis   . Cataract   . CHF (congestive heart failure) (HCC)   . Depression   . Dysrhythmia   . GERD (gastroesophageal reflux disease)   . Heart murmur   . Hypertension   . Myocardial infarction (HCC)   . Osteoporosis   . Presence of permanent cardiac pacemaker   . Thyroid disease     Medications:  Scheduled:  . carvedilol  25 mg Oral BID WC  . gabapentin  300 mg Oral QHS  . levothyroxine  25 mcg Oral QAC breakfast  . losartan  25 mg Oral Daily  . mirabegron ER  25 mg Oral Daily  . temazepam  7.5 mg Oral QHS   Infusions:  . sodium chloride      10/28/16, PharmD  08/28/2016 9:06 AM

## 2016-08-28 NOTE — Progress Notes (Signed)
[employer] [facility] [place] at [facility]   PATIENT NAME: Joanna Roberts    MR#:  [id number]  DATE OF BIRTH:  May 25, 1928  SUBJECTIVE:   Patient here with hematoma on right thigh. She denies fall. He  REVIEW OF SYSTEMS:    Review of Systems  Constitutional: Negative for fever, chills weight loss HENT: Negative for ear pain, nosebleeds, congestion, facial swelling, rhinorrhea, neck pain, neck stiffness and ear discharge.   Respiratory: Negative for cough, shortness of breath, wheezing  Cardiovascular: Negative for chest pain, palpitations and leg swelling.  Gastrointestinal: Negative for heartburn, abdominal pain, vomiting, diarrhea or consitpation Genitourinary: Negative for dysuria, urgency, frequency, hematuria Musculoskeletal: Negative for back pain or joint pain Right thigh pain Neurological: Negative for dizziness, seizures, syncope, focal weakness,  numbness and headaches.  Hematological: Does  bruise/bleed easily she is on blood thinners Psychiatric/Behavioral: Negative for hallucinations, confusion, dysphoric mood    Tolerating Diet: yes      DRUG ALLERGIES:   Allergies  Allergen Reactions  . Lortab [Hydrocodone-Acetaminophen] Itching, Swelling and Rash  . Doxycycline Diarrhea  . Macrobid [Nitrofurantoin Monohyd Macro] Diarrhea and Nausea And Vomiting  . Nitrofurantoin Diarrhea, Nausea Only and Nausea And Vomiting    VITALS:  Blood pressure (!) 142/67, pulse 80, temperature 97.9 F (36.6 C), temperature source Oral, resp. rate 18, height 5\' 1"  (1.549 m), weight 61.2 kg (135 lb), SpO2 99 %.  PHYSICAL EXAMINATION:  Constitutional: Appears well-developed and well-nourished. No distress. HENT: Normocephalic. Oropharynx is clear and moist.  Eyes: Conjunctivae and EOM are normal. PERRLA, no scleral icterus.  Neck: Normal ROM. Neck supple. No JVD. No tracheal deviation. CVS: RRR, S1/S2 +, 3/6 SEM, no gallops, no carotid bruit.  Pulmonary: Effort and  breath sounds normal, no stridor, rhonchi, wheezes, rales.  Abdominal: Soft. BS +,  no distension, tenderness, rebound or guarding.  Musculoskeletal: decreased ROM right leg due to hematoma with associated mild swelling  And tenderness.  Neuro: Alert. CN 2-12 grossly intact. No focal deficits. Skin: Skin is warm and dry. No rash noted. Psychiatric: Normal mood and affect.      LABORATORY PANEL:   CBC  Recent Labs Lab 08/28/16 0725  WBC 11.3*  HGB 6.6*  HCT 21.1*  PLT 146*   ------------------------------------------------------------------------------------------------------------------  Chemistries   Recent Labs Lab 08/28/16 0140  NA 134*  K 4.7  CL 105  CO2 25  GLUCOSE 140*  BUN 31*  CREATININE 0.96  CALCIUM 8.5*   ------------------------------------------------------------------------------------------------------------------  Cardiac Enzymes  Recent Labs Lab 08/27/16 1511  TROPONINI <0.03   ------------------------------------------------------------------------------------------------------------------  RADIOLOGY:  Dg Chest 2 View  Result Date: 08/27/2016 CLINICAL DATA:  Chest pain EXAM: CHEST  2 VIEW COMPARISON:  06/01/2016 FINDINGS: Chronic cardiomegaly. There is a biventricular ICD/ pacer from the left with 2 right ventricular leads. There is no edema, consolidation, effusion, or pneumothorax. Kyphosis with chronic compression fracture at T12. IMPRESSION: 1. No evidence of active disease. 2. Cardiomegaly. Electronically Signed   By: 07/30/2016 M.D.   On: 08/27/2016 15:46   Ct Angio Low Extrem Right W &/or Wo Contrast  Result Date: 08/27/2016 CLINICAL DATA:  Right lower extremity pain and swelling after recent steroid injection. EXAM: CT ANGIOGRAPHY OF THE RIGHT LOWER EXTREMITY TECHNIQUE: Multidetector CT imaging of the right/left upper/lowerwas performed using the standard protocol during bolus administration of intravenous contrast. Multiplanar CT  image reconstructions and MIPs were obtained to evaluate the vascular anatomy. CONTRAST:  100 mL Isovue 370 IV COMPARISON:  None. FINDINGS:  The dominant finding is a large intramuscular hematoma within the quadriceps musculature and primarily centered within the vastus intermedius and lateralis muscles. The hematoma begins in the proximal thigh just below the hip joint and continues into the distal thigh. No soft tissue gas identified. Arterial supply to the right lower extremity is intact and there is no evidence of arterial injury or arterial contrast extravasation. Scattered calcified plaque is noted in the common femoral and superficial femoral arteries without evidence of significant obstructive disease. Calcified plaque is present in the popliteal artery without significant stenosis. Underlying bony structures are intact and without evidence of fracture or lesion. No soft tissue foreign body is identified. Review of the MIP images confirms the above findings. IMPRESSION: Large right thigh intramuscular hematoma of the quadriceps musculature primarily centered within the vastus intermedius and lateralis muscles and extending from the proximal thigh to distal thigh. No arterial injury or contrast extravasation identified. Electronically Signed   By: Irish Lack M.D.   On: 08/27/2016 17:44     ASSESSMENT AND PLAN:   83 year old female with a history of atrial fibrillation on anticoagulation who presents with right thigh pain and found to have large right intramuscular hematoma.    1. Right thigh intramuscular hematoma: Orthopedic surgery consultation appreciated. Continue supportive care Stop aspirin and Coumadin for now Anterior thigh compartment measured 23 mm per mercury not concerning for acute compartment syndrome Received Kcentra  2. Acute on chronic blood loss anemia due to problem #1 Patient has consented for blood transfusion  3. 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 History of AICD  8. Hypothyroid: Continue Synthroid  Management plans discussed with the patient and she is in agreement.  CODE STATUS: dnr  TOTAL TIME TAKING CARE OF THIS PATIENT: 30 minutes.     POSSIBLE D/C 1-2 days, DEPENDING ON CLINICAL CONDITION.   Pinkey Mcjunkin M.D on 08/28/2016 at 8:20 AM  Between 7am to 6pm - Pager - [phone] After 6pm go to [email or web address] - password [email or web address]  [facility]  Office  [phone]  CC: Primary care physician; Patrice Paradise, MD  Note: This dictation was prepared with Dragon dictation along with smaller phrase technology. Any transcriptional errors that result from this process are unintentional.

## 2016-08-28 NOTE — Consult Note (Signed)
[facility] CLINIC CARDIOLOGY [facility]  CARDIOLOGY CONSULT NOTE  Patient ID: Joanna Roberts MRN: [id number] DOB/AGE: [birth year] 83 y.o.  Admit date: 08/27/2016 Referring Physician Dr. Juliene Pina Primary Physician Mimi McLaughliin, PA Primary Cardiologist Dr. Lady Gary Reason for Consultation right thigh hematoma  HPI: Pt is an 83 yo female with history of afib on chronic anticoagulation with warfarin, history of cardiomyopathy with aicd in place, history of chronic systolic heart failure who received an injection in her right hip two days prior to the presenting  event. She developed pain in her right thigh with swelling prompting presentation to the er. Her inr on presentation was 3.77. Angio ct revealed intramuscular hematoma with no compartment syndrome. She received 10 mg vitamin K and 1500 units Kcentra..  INR today is 127. Her HGB dropped to 6.6 on admission. Her outpatient inr on 4/26 was 2.5. She has chornic left lateral chest pain. She has ruled out for an mi.   Review of Systems  HENT: Negative.   Eyes: Negative.   Respiratory: Negative.   Cardiovascular: Positive for chest pain.  Gastrointestinal: Negative.   Genitourinary: Negative.   Musculoskeletal: Positive for myalgias. Negative for falls.  Skin: Negative.   Neurological: Positive for weakness.  Endo/Heme/Allergies: Bruises/bleeds easily.  Psychiatric/Behavioral: Negative.     Past Medical History:  Diagnosis Date  . AICD (automatic cardioverter/defibrillator) present   . Allergy   . Anemia   . Arthritis   . Cataract   . CHF (congestive heart failure) (HCC)   . Depression   . Dysrhythmia   . GERD (gastroesophageal reflux disease)   . Heart murmur   . Hypertension   . Myocardial infarction (HCC)   . Osteoporosis   . Presence of permanent cardiac pacemaker   . Thyroid disease     Family History  Problem Relation Age of Onset  . Early death Mother   . Heart disease Mother   . Arthritis Father     . Asthma Father   . Stroke Sister     Social History   Social History  . Marital status: Widowed    Spouse name: N/A  . Number of children: N/A  . Years of education: N/A   Occupational History  . Not on file.   Social History Main Topics  . Smoking status: Never Smoker  . Smokeless tobacco: Never Used  . Alcohol use No  . Drug use: No  . Sexual activity: Not Currently    Birth control/ protection: Post-menopausal   Other Topics Concern  . Not on file   Social History Narrative  . No narrative on file    Past Surgical History:  Procedure Laterality Date  . ABDOMINAL HYSTERECTOMY    . APPENDECTOMY    . HERNIA REPAIR    . INSERT / REPLACE / REMOVE PACEMAKER    . PERIPHERAL VASCULAR CATHETERIZATION Right 10/08/2014   Procedure: Lower Extremity Angiography;  Surgeon: Renford Dills, MD;  Location: [facility] INVASIVE CV LAB;  Service: Cardiovascular;  Laterality: Right;  . PERIPHERAL VASCULAR CATHETERIZATION  10/08/2014   Procedure: Lower Extremity Intervention;  Surgeon: Renford Dills, MD;  Location: [facility] INVASIVE CV LAB;  Service: Cardiovascular;;  . PERIPHERAL VASCULAR CATHETERIZATION N/A 03/23/2016   Procedure: Visceral Angiography;  Surgeon: Renford Dills, MD;  Location: [facility] INVASIVE CV LAB;  Service: Cardiovascular;  Laterality: N/A;     Facility-Administered Medications Prior to Admission  Medication Dose Route Frequency Provider Last Rate Last Dose  .  mirabegron ER (MYRBETRIQ) tablet 25 mg  25 mg Oral Daily Hildred Laser, MD       Prescriptions Prior to Admission  Medication Sig Dispense Refill Last Dose  . aspirin EC 81 MG EC tablet Take 1 tablet (81 mg total) by mouth daily. 180 tablet 2 08/26/2016 at am  . carvedilol (COREG) 25 MG tablet TAKE ONE TABLET BY MOUTH TWICE DAILY WITH  MEALS   08/27/2016 at am  . diphenoxylate-atropine (LOMOTIL) 2.5-0.025 MG tablet Take 1-2 tablets by mouth 4 (four) times daily as needed for diarrhea or loose stools. 40 tablet 0  prn at prn  . furosemide (LASIX) 20 MG tablet Take 1 tablet (20 mg total) by mouth daily. (Patient taking differently: Take 20 mg by mouth daily as needed for fluid. ) 30 tablet 0 prn at prn  . gabapentin (NEURONTIN) 300 MG capsule Take 300 mg by mouth at bedtime.    08/26/2016 at qhs  . HYDROcodone-acetaminophen (NORCO/VICODIN) 5-325 MG tablet Limit one half to one tab by mouth per day or 2-3 times per day if tolerated 80 tablet 0 08/27/2016 at am  . levothyroxine (SYNTHROID, LEVOTHROID) 25 MCG tablet Take 25 mcg by mouth daily before breakfast.   08/27/2016 at am  . loperamide (ANTI-DIARRHEAL) 2 MG capsule Take 2 mg by mouth as needed for diarrhea or loose stools.   prn at prn  . losartan (COZAAR) 25 MG tablet TAKE ONE TABLET EVERY DAY   08/27/2016 at am  . nitroGLYCERIN (NITROSTAT) 0.4 MG SL tablet Place 0.4 mg under the tongue every 5 (five) minutes as needed for chest pain.    prn at prn  . ondansetron (ZOFRAN) 4 MG tablet Take 4 mg by mouth every 8 (eight) hours as needed.    prn at prn  . silver sulfADIAZINE (SILVADENE) 1 % cream Apply 1 application topically 2 (two) times daily. Apply to area twice a day 25 g 2 prn at prn  . temazepam (RESTORIL) 7.5 MG capsule Take 7.5 mg by mouth at bedtime.    08/26/2016 at qhs  . vitamin B-12 (CYANOCOBALAMIN) 1000 MCG tablet Take 1,000 mcg by mouth daily.   08/26/2016 at am  . warfarin (COUMADIN) 2.5 MG tablet Take 2.5 mg by mouth daily.    08/26/2016 at pm  . budesonide (ENTOCORT EC) 3 MG 24 hr capsule Take 3 mg by mouth daily.    Not Taking at Unknown time  . docusate sodium (COLACE) 100 MG capsule Take 1 capsule (100 mg total) by mouth 2 (two) times daily as needed (take to keep stool soft.). (Patient not taking: Reported on 08/27/2016) 60 capsule 0 Not Taking at Unknown time    Physical Exam: Blood pressure 105/64, pulse 83, temperature 98.4 F (36.9 C), temperature source Oral, resp. rate 17, height 5\' 1"  (1.549 m), weight 61.2 kg (135 lb), SpO2 98 %.   Wt  Readings from Last 1 Encounters:  08/27/16 61.2 kg (135 lb)     General appearance: alert and cooperative Resp: clear to auscultation bilaterally Cardio: irregularly irregular rhythm GI: soft, non-tender; bowel sounds normal; no masses,  no organomegaly Extremities: hematoma with bruising on her right thigh and bruising on her arms and legs.  Neurologic: Grossly normal  Labs:   Lab Results  Component Value Date   WBC 11.3 (H) 08/28/2016   HGB 6.6 (L) 08/28/2016   HCT 21.1 (L) 08/28/2016   MCV 93.6 08/28/2016   PLT 146 (L) 08/28/2016    Recent Labs Lab  08/28/16 0140  NA 134*  K 4.7  CL 105  CO2 25  BUN 31*  CREATININE 0.96  CALCIUM 8.5*  GLUCOSE 140*   Lab Results  Component Value Date   TROPONINI <0.03 08/28/2016       EKG: afib  ASSESSMENT AND PLAN:  Pt with history of cardiomyopathy with ef less than 30 with aicd in place, history of afib treated with rate control and anticoagulaiton wit warfarin admitted with right thigh pain and hematoma. She had therapeutic inr 4/26. She received a steroid injeciton on the right hip two days prior to pressentaiton and her bruising began worsening. She denies any trauma to the thigh. In the er, She was anaemic at 6.6, large noncompressing hematoma in the right thich and supratherapeutic inr at 4.77. Was given reversal protocol with vitamin K and Kcentra. INR is now 1.27. Will receive transfusion for hgb of 6.6. Would stop warfarin for now and follow hematoma. Pts inr was controlled. Chest pain does not appear to be ischemic. Her aicd is funcitonal. Remain with rate control and off of anticaogulaiton. Transfuse as you are doing . Will follow with you Signed: Dalia Heading MD, [facility] 08/28/2016, 1:13 PM

## 2016-08-29 LAB — BASIC METABOLIC PANEL
ANION GAP: 5 (ref 5–15)
BUN: 32 mg/dL — ABNORMAL HIGH (ref 6–20)
CALCIUM: 8.2 mg/dL — AB (ref 8.9–10.3)
CO2: 25 mmol/L (ref 22–32)
CREATININE: 1.01 mg/dL — AB (ref 0.44–1.00)
Chloride: 106 mmol/L (ref 101–111)
GFR, EST AFRICAN AMERICAN: 57 mL/min — AB (ref >60)
GFR, EST NON AFRICAN AMERICAN: 49 mL/min — AB (ref >60)
Glucose, Bld: 117 mg/dL — ABNORMAL HIGH (ref 65–99)
Potassium: 4.8 mmol/L (ref 3.5–5.1)
SODIUM: 136 mmol/L (ref 135–145)

## 2016-08-29 LAB — CBC
HCT: 22 % — ABNORMAL LOW (ref 35.0–47.0)
Hemoglobin: 7.2 g/dL — ABNORMAL LOW (ref 12.0–16.0)
MCH: 29.6 pg (ref 26.0–34.0)
MCHC: 32.7 g/dL (ref 32.0–36.0)
MCV: 90.5 fL (ref 80.0–100.0)
PLATELETS: 120 10*3/uL — AB (ref 150–440)
RBC: 2.44 MIL/uL — ABNORMAL LOW (ref 3.80–5.20)
RDW: 16.4 % — AB (ref 11.5–14.5)
WBC: 12.1 10*3/uL — AB (ref 3.6–11.0)

## 2016-08-29 LAB — TROPONIN I: Troponin I: <0.03 ng/mL (ref <0.03)

## 2016-08-29 LAB — HEMOGLOBIN: Hemoglobin: 9.3 g/dL — ABNORMAL LOW (ref 12.0–16.0)

## 2016-08-29 LAB — PROTIME-INR
INR: 1.23
PROTHROMBIN TIME: 15.6 s — AB (ref 11.4–15.2)

## 2016-08-29 MED ORDER — FERROUS SULFATE 325 (65 FE) MG PO TABS
325.0000 mg | ORAL_TABLET | Freq: Two times a day (BID) | ORAL | Status: DC
  Administered 2016-08-29: 325 mg via ORAL
  Filled 2016-08-29: qty 1

## 2016-08-29 MED ORDER — NITROGLYCERIN 2 % TD OINT
0.5000 [in_us] | TOPICAL_OINTMENT | Freq: Four times a day (QID) | TRANSDERMAL | Status: DC
  Administered 2016-08-29 (×2): 0.5 [in_us] via TOPICAL
  Filled 2016-08-29: qty 30

## 2016-08-29 MED ORDER — FUROSEMIDE 20 MG PO TABS: 20.0000 mg | ORAL_TABLET | Freq: Every day | ORAL | Status: DC | PRN

## 2016-08-29 MED ORDER — FUROSEMIDE 10 MG/ML IJ SOLN
20.0000 mg | Freq: Once | INTRAMUSCULAR | Status: AC
  Administered 2016-08-29: 20 mg via INTRAVENOUS
  Filled 2016-08-29: qty 2

## 2016-08-29 MED ORDER — ACETAMINOPHEN 325 MG PO TABS
650.0000 mg | ORAL_TABLET | Freq: Four times a day (QID) | ORAL | Status: DC | PRN
  Administered 2016-08-29: 650 mg via ORAL
  Filled 2016-08-29: qty 2

## 2016-08-29 MED ORDER — FERROUS SULFATE 325 (65 FE) MG PO TABS: 325.0000 mg | ORAL_TABLET | Freq: Every day | ORAL | 0 refills | Status: DC

## 2016-08-29 MED ORDER — SODIUM CHLORIDE 0.9 % IV SOLN: Freq: Once | INTRAVENOUS | Status: DC

## 2016-08-29 MED ORDER — FERROUS FUMARATE 324 (106 FE) MG PO TABS
1.0000 | ORAL_TABLET | Freq: Every day | ORAL | Status: DC
  Filled 2016-08-29: qty 1

## 2016-08-29 MED ORDER — CIPROFLOXACIN HCL 500 MG PO TABS: 500.0000 mg | ORAL_TABLET | Freq: Two times a day (BID) | ORAL | 0 refills | Status: AC

## 2016-08-29 NOTE — Progress Notes (Signed)
Subjective: The patient was examined yesterday and again today. The patient still notes moderate pain in her right thigh with activity, but is able to get out of bed to use the bathroom and is ambulating with a walker. She feels stronger since receiving the blood transfusions. The second unit is going in now.  Objective: Vital signs in last 24 hours: Temp:  [97.8 F (36.6 C)-98.7 F (37.1 C)] 98 F (36.7 C) (05/06 1120) Pulse Rate:  [69-84] 69 (05/06 1120) Resp:  [16-20] 18 (05/06 1120) BP: (92-142)/(51-71) 100/63 (05/06 1120) SpO2:  [98 %-100 %] 99 % (05/06 1120)  Intake/Output from previous day: 05/05 0701 - 05/06 0700 In: 820 [P.O.:480; Blood:340] Out: 700 [Urine:700] Intake/Output this shift: Total I/O In: 560 [P.O.:240; Blood:320] Out: 300 [Urine:300]   Recent Labs  08/27/16 1511 08/28/16 0140 08/28/16 0725 08/28/16 1623 08/29/16 0240  HGB 8.7* 6.6* 6.6* 7.8* 7.2*    Recent Labs  08/28/16 0725 08/28/16 1623 08/29/16 0240  WBC 11.3*  --  12.1*  RBC 2.25*  --  2.44*  HCT 21.1* 23.6* 22.0*  PLT 146*  --  120*    Recent Labs  08/28/16 0140 08/29/16 0240  NA 134* 136  K 4.7 4.8  CL 105 106  CO2 25 25  BUN 31* 32*  CREATININE 0.96 1.01*  GLUCOSE 140* 117*  CALCIUM 8.5* 8.2*    Recent Labs  08/28/16 2024 08/29/16 0240  INR 1.17 1.23    Physical Exam: On examination, there is moderate swelling in the right thigh with some early ecchymosis. Overall, the thigh does feel softer than yesterday and softer still than upon her initial presentation. The patient is able to perform an active straight leg raise and is able to flex her knee beyond 70 with only mild discomfort. She is neurovascularly intact to the right lower extremity and foot.  Assessment: Right thigh intramuscular hematoma, improving symptomatically.  Plan: I agree with the decision to proceed with transfusions, given her low hematocrit. The patient may continue to be mobilized as symptoms  permit. As she is able to get in and out of bed independently and move around safely with her walker, I do feel it is reasonable for her to be discharged home later today as per the present plan. Please arrange for her to follow-up in our office with either myself or with Horris Latino, my physician assistant, in 2 weeks or so.  Thank you for asking me to participate in the care of this most pleasant woman. Please let me know if there is anything further I can do.   Excell Seltzer Digna Countess 08/29/2016, 12:33 PM

## 2016-08-29 NOTE — Progress Notes (Signed)
PT Cancellation Note  Patient Details Name: Joanna Roberts MRN: [id number] DOB: March 19, 1929   Cancelled Treatment:    Reason Eval/Treat Not Completed: Medical issues which prohibited therapy  Ezekiel Ina, PT, DPT Jones Broom S 08/29/2016, 12:29 PM

## 2016-08-29 NOTE — Care Management Note (Signed)
Case Management Note  Patient Details  Name: Joanna Roberts MRN: [id number] Date of Birth: [birth year]  Subjective/Objective:        Discussed discharge planning with [name]. A referral was called to [facility] requesting HH-PT, RN. Vaughan Basta was updated by this Clinical research associate per order for Hgb draw on Monday 08/30/16 and to send results to PCP Maurine Minister. [name] was advised to discuss whether or not she would have a co-pay for home health services with [facility] when they called for an appointment. [name] is to receive a blood transfusion prior to discharge this afternoon.             Action/Plan:   Expected Discharge Date:  08/29/16               Expected Discharge Plan:     In-House Referral:     Discharge planning Services     Post Acute Care Choice:    Choice offered to:     DME Arranged:    DME Agency:     HH Arranged:    HH Agency:     Status of Service:     If discussed at [employer], dates discussed:    Additional Comments:  Cathy Ropp A, RN 08/29/2016, 10:04 AM

## 2016-08-29 NOTE — Progress Notes (Signed)
Discharge instructions explained to pt/ verbalized an understanding/ iv and tele removed/ Rx given to pt/ will transport off unit via wheelchair when ride arrives.

## 2016-08-29 NOTE — Progress Notes (Signed)
MEDICATION RELATED CONSULT NOTE   Pharmacy consulted for post Kcentra and INR monitoring in this 89 yoF on warfarin PTA for afib with possible anterior thigh compartment syndrome which would need surgical intervention. INR in the ER is 3.77. Pt has history of PVD.  Patient is to receive vitamin K 10 mg and Kcentra 1500 units (~25u/kg). Plan has been discussed with MD.  After CT, the anterior thigh compartment pressure is below the threshold for acute compartment syndrome. Per orthopedics, pt can be managed with observation at this time.  Plan: Will schedule INR draws 60 min post-Kcentra (25 min infusion started at 1800), then every 6 hours x 4 and then daily x 2 per consult. INRs have been ordered.  INR 5/4 1500 3.77 INR 5/4 1930 1.54 INR 5/5 0130 1.41 INR 5/5 0730  1.27  INR 5/5 1400 1.15 INR 5/5 2030 1.17 INR 5/6 0230 1.23    Allergies  Allergen Reactions  . Lortab [Hydrocodone-Acetaminophen] Itching, Swelling and Rash  . Doxycycline Diarrhea  . Macrobid [Nitrofurantoin Monohyd Macro] Diarrhea and Nausea And Vomiting  . Nitrofurantoin Diarrhea, Nausea Only and Nausea And Vomiting    Patient Measurements: Height: 5\' 1"  (154.9 cm) Weight: 135 lb (61.2 kg) IBW/kg (Calculated) : 47.8  Vital Signs: Temp: 98.2 F (36.8 C) (05/06 0232) Temp Source: Oral (05/06 0232) BP: 113/61 (05/06 0232) Pulse Rate: 74 (05/06 0232) Intake/Output from previous day: 05/05 0701 - 05/06 0700 In: 820 [P.O.:480; Blood:340] Out: 700 [Urine:700] Intake/Output from this shift: No intake/output data recorded.  Labs:  Recent Labs  08/27/16 1511 08/28/16 0140 08/28/16 0725 08/28/16 1623 08/29/16 0240  WBC 9.8 12.1* 11.3*  --  12.1*  HGB 8.7* 6.6* 6.6* 7.8* 7.2*  HCT 27.0* 20.9* 21.1* 23.6* 22.0*  PLT 159 141* 146*  --  120*  APTT 38*  --   --   --   --   CREATININE 0.94 0.96  --   --  1.01*   Estimated Creatinine Clearance: 34.2 mL/min (A) (by C-G formula based on SCr of 1.01 mg/dL  (H)).   Microbiology: No results found for this or any previous visit (from the past 720 hour(s)).  Medical History: Past Medical History:  Diagnosis Date  . AICD (automatic cardioverter/defibrillator) present   . Allergy   . Anemia   . Arthritis   . Cataract   . CHF (congestive heart failure) (HCC)   . Depression   . Dysrhythmia   . GERD (gastroesophageal reflux disease)   . Heart murmur   . Hypertension   . Myocardial infarction (HCC)   . Osteoporosis   . Presence of permanent cardiac pacemaker   . Thyroid disease     Medications:  Scheduled:  . carvedilol  25 mg Oral BID WC  . ciprofloxacin  500 mg Oral BID  . gabapentin  300 mg Oral QHS  . levothyroxine  25 mcg Oral QAC breakfast  . losartan  25 mg Oral Daily  . mirabegron ER  25 mg Oral Daily  . nitroGLYCERIN  0.5 inch Topical Q6H  . temazepam  7.5 mg Oral QHS   Infusions:    10/29/16, PharmD  08/29/2016 6:28 AM

## 2016-08-29 NOTE — Progress Notes (Signed)
Patient c/o chest pain unrelieved by SL nitro and 2L oxygen via [place]. MD called. New orders rec'd for. Patient report feeling better at this time. Patient reports feeling stressed because she has already lost 2 children and the last living child, who lives in [place], has been experiencing some heart problems. This RN consoled patient for 20 minutes.

## 2016-08-29 NOTE — Progress Notes (Signed)
       [facility]  SUBJECTIVE: feels better with less pain in left thigh   Vitals:   08/29/16 0232 08/29/16 0735 08/29/16 1105 08/29/16 1120  BP: 113/61 (!) 142/71 (!) 92/51 100/63  Pulse: 74 70 69 69  Resp: 20 20 18 18   Temp: 98.2 F (36.8 C)  97.8 F (36.6 C) 98 F (36.7 C)  TempSrc: Oral  Oral   SpO2: 98% 100% 99% 99%  Weight:      Height:        Intake/Output Summary (Last 24 hours) at 08/29/16 1332 Last data filed at 08/29/16 1105  Gross per 24 hour  Intake              900 ml  Output              500 ml  Net              400 ml    LABS: Basic Metabolic Panel:  Recent Labs  10/29/16 0140 08/29/16 0240  NA 134* 136  K 4.7 4.8  CL 105 106  CO2 25 25  GLUCOSE 140* 117*  BUN 31* 32*  CREATININE 0.96 1.01*  CALCIUM 8.5* 8.2*   Liver Function Tests: No results for input(s): AST, ALT, ALKPHOS, BILITOT, PROT, ALBUMIN in the last 72 hours. No results for input(s): LIPASE, AMYLASE in the last 72 hours. CBC:  Recent Labs  08/28/16 0725 08/28/16 1623 08/29/16 0240  WBC 11.3*  --  12.1*  HGB 6.6* 7.8* 7.2*  HCT 21.1* 23.6* 22.0*  MCV 93.6  --  90.5  PLT 146*  --  120*   Cardiac Enzymes:  Recent Labs  08/28/16 1400 08/28/16 2024 08/29/16 0240  TROPONINI <0.03 <0.03 <0.03   BNP: Invalid input(s): POCBNP D-Dimer: No results for input(s): DDIMER in the last 72 hours. Hemoglobin A1C: No results for input(s): HGBA1C in the last 72 hours. Fasting Lipid Panel: No results for input(s): CHOL, HDL, LDLCALC, TRIG, CHOLHDL, LDLDIRECT in the last 72 hours. Thyroid Function Tests: No results for input(s): TSH, T4TOTAL, T3FREE, THYROIDAB in the last 72 hours.  Invalid input(s): FREET3 Anemia Panel: No results for input(s): VITAMINB12, FOLATE, FERRITIN, TIBC, IRON, RETICCTPCT in the last 72 hours.   Physical Exam: Blood pressure 100/63, pulse 69, temperature 98 F (36.7 C), resp. rate 18, height 5\' 1"  (1.549  m), weight 61.2 kg (135 lb), SpO2 99 %.   Wt Readings from Last 1 Encounters:  08/27/16 61.2 kg (135 lb)     General appearance: alert and cooperative Resp: clear to auscultation bilaterally Cardio: irregularly irregular rhythm GI: soft, non-tender; bowel sounds normal; no masses,  no organomegaly Extremities: extremities normal, atraumatic, no cyanosis or edema Neurologic: Grossly normal  TELEMETRY: Reviewed telemetry pt in afib with controlled vr:  ASSESSMENT AND PLAN:  Active Problems:   Hematoma of right thigh-still somewhat tender but less swollen and less firm. Receiving another unit prbc today. Off warfarin. INR 1.23. Wil remain off of warfarin for now as risk outweighs benefit. Will discuss further chronic anticoagulation as outpatient. After unit of prbc done today , consdier dischrage with outpatinet follow up in our office in 1-2 weeks. CBC 7.2 this am.     , MD, [facility] 08/29/2016 1:32 PM

## 2016-08-29 NOTE — Discharge Summary (Signed)
[employer] [facility] [place] [facility]   PATIENT NAME: Joanna Roberts    MR#:  [id number]  DATE OF BIRTH:  January 05, 1929  DATE OF ADMISSION:  08/27/2016 ADMITTING PHYSICIAN: Shaune Pollack, MD  DATE OF DISCHARGE: 08/29/2016  PRIMARY CARE PHYSICIAN: Patrice Paradise, MD    ADMISSION DIAGNOSIS:  Right leg pain [M79.604] Right leg swelling [M79.89]  DISCHARGE DIAGNOSIS:  Active Problems:   Hematoma of left thigh   SECONDARY DIAGNOSIS:   Past Medical History:  Diagnosis Date  . AICD (automatic cardioverter/defibrillator) present   . Allergy   . Anemia   . Arthritis   . Cataract   . CHF (congestive heart failure) (HCC)   . Depression   . Dysrhythmia   . GERD (gastroesophageal reflux disease)   . Heart murmur   . Hypertension   . Myocardial infarction (HCC)   . Osteoporosis   . Presence of permanent cardiac pacemaker   . Thyroid disease     HOSPITAL COURSE:   83 year old female with a history of atrial fibrillation on anticoagulation who presents with right thigh pain and found to have large right intramuscular hematoma.    1. Right thigh intramuscular hematoma: Patient was evaluated by orthopedic surgery. Anterior thigh compartment measured 23 mm per mercury not concerning for acute compartment syndrome. Aspirin and Coumadin are discontinued for now until hematoma completely resolves She received Received Kcentra as well.  2. Acute on chronic blood loss anemia due to problem #1 She has had 2 units of PRBCs. She is started on iron supplements. This causes constipation so she is started daily and made do twice a day in one week if tolerated. Patient will have hemoglobin repeated tomorrow and sent to PCP office   3. Chronic atrial fibrillation : Anticoagulation has been discontinued  due to problem #1 for now May restart once hematoma has resolved. She was evaluated by cardiology while in the hospital. Continue Coreg for heart rate control  4.chest  pain: This was not ischemic in nature as per cardiology. Patient will follow-up with cardiology in 1-2 weeks.  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 History of AICD  8. Hypothyroid: Continue Synthroid   DISCHARGE CONDITIONS AND DIET:   Stable for discharge on heart healthy diet  CONSULTS OBTAINED:  Treatment Team:  Poggi, Excell Seltzer, MD Dalia Heading, MD  DRUG ALLERGIES:   Allergies  Allergen Reactions  . Lortab [Hydrocodone-Acetaminophen] Itching, Swelling and Rash  . Doxycycline Diarrhea  . Macrobid [Nitrofurantoin Monohyd Macro] Diarrhea and Nausea And Vomiting  . Nitrofurantoin Diarrhea, Nausea Only and Nausea And Vomiting    DISCHARGE MEDICATIONS:   Current Discharge Medication List    START taking these medications   Details  ciprofloxacin (CIPRO) 500 MG tablet Take 1 tablet (500 mg total) by mouth 2 (two) times daily. Qty: 6 tablet, Refills: 0    ferrous sulfate 325 (65 FE) MG tablet Take 1 tablet (325 mg total) by mouth daily with breakfast. Qty: 30 tablet, Refills: 0      CONTINUE these medications which have CHANGED   Details  furosemide (LASIX) 20 MG tablet Take 1 tablet (20 mg total) by mouth daily as needed for fluid.      CONTINUE these medications which have NOT CHANGED   Details  carvedilol (COREG) 25 MG tablet TAKE ONE TABLET BY MOUTH TWICE DAILY WITH  MEALS    diphenoxylate-atropine (LOMOTIL) 2.5-0.025 MG  tablet Take 1-2 tablets by mouth 4 (four) times daily as needed for diarrhea or loose stools. Qty: 40 tablet, Refills: 0    gabapentin (NEURONTIN) 300 MG capsule Take 300 mg by mouth at bedtime.     HYDROcodone-acetaminophen (NORCO/VICODIN) 5-325 MG tablet Limit one half to one tab by mouth per day or 2-3 times per day if tolerated Qty: 80 tablet, Refills: 0    levothyroxine (SYNTHROID, LEVOTHROID) 25 MCG tablet Take 25 mcg by mouth daily before breakfast.    loperamide  (ANTI-DIARRHEAL) 2 MG capsule Take 2 mg by mouth as needed for diarrhea or loose stools.    losartan (COZAAR) 25 MG tablet TAKE ONE TABLET EVERY DAY    nitroGLYCERIN (NITROSTAT) 0.4 MG SL tablet Place 0.4 mg under the tongue every 5 (five) minutes as needed for chest pain.     ondansetron (ZOFRAN) 4 MG tablet Take 4 mg by mouth every 8 (eight) hours as needed.     silver sulfADIAZINE (SILVADENE) 1 % cream Apply 1 application topically 2 (two) times daily. Apply to area twice a day Qty: 25 g, Refills: 2    temazepam (RESTORIL) 7.5 MG capsule Take 7.5 mg by mouth at bedtime.     vitamin B-12 (CYANOCOBALAMIN) 1000 MCG tablet Take 1,000 mcg by mouth daily.    budesonide (ENTOCORT EC) 3 MG 24 hr capsule Take 3 mg by mouth daily.     docusate sodium (COLACE) 100 MG capsule Take 1 capsule (100 mg total) by mouth 2 (two) times daily as needed (take to keep stool soft.). Qty: 60 capsule, Refills: 0      STOP taking these medications     aspirin EC 81 MG EC tablet      warfarin (COUMADIN) 2.5 MG tablet           Today   CHIEF COMPLAINT:  Patient would like to go home today after blood transfusion. She denies chest pain or shortness of breath.   VITAL SIGNS:  Blood pressure 113/61, pulse 74, temperature 98.2 F (36.8 C), temperature source Oral, resp. rate 20, height 5\' 1"  (1.549 m), weight 61.2 kg (135 lb), SpO2 98 %.   REVIEW OF SYSTEMS:  Review of Systems  Constitutional: Negative.  Negative for chills, fever and malaise/fatigue.  HENT: Negative.  Negative for ear discharge, ear pain, hearing loss, nosebleeds and sore throat.   Eyes: Negative.  Negative for blurred vision and pain.  Respiratory: Negative.  Negative for cough, hemoptysis, shortness of breath and wheezing.   Cardiovascular: Negative.  Negative for chest pain, palpitations and leg swelling.  Gastrointestinal: Negative.  Negative for abdominal pain, blood in stool, diarrhea, nausea and vomiting.   Genitourinary: Negative.  Negative for dysuria.  Musculoskeletal: Negative.  Negative for back pain.  Skin: Negative.   Neurological: Negative for dizziness, tremors, speech change, focal weakness, seizures and headaches.  Endo/Heme/Allergies: Negative.  Does not bruise/bleed easily.  Psychiatric/Behavioral: Negative.  Negative for depression, hallucinations and suicidal ideas.     PHYSICAL EXAMINATION:  GENERAL:  83 y.o.-year-old patient lying in the bed with no acute distress.  NECK:  Supple, no jugular venous distention. No thyroid enlargement, no tenderness.  LUNGS: Normal breath sounds bilaterally, no wheezing, rales,rhonchi  No use of accessory muscles of respiration.  CARDIOVASCULAR: S1, S2 normal. 3/6 SEM NO rubs, or gallops.  ABDOMEN: Soft, non-tender, non-distended. Bowel sounds present. No organomegaly or mass.  EXTREMITIES: No pedal edema, cyanosis, or clubbing.  PSYCHIATRIC: The patient is alert and oriented x  3.  SKIN: No obvious rash, lesion, or ulcer.   DATA REVIEW:   CBC  Recent Labs Lab 08/29/16 0240  WBC 12.1*  HGB 7.2*  HCT 22.0*  PLT 120*    Chemistries   Recent Labs Lab 08/29/16 0240  NA 136  K 4.8  CL 106  CO2 25  GLUCOSE 117*  BUN 32*  CREATININE 1.01*  CALCIUM 8.2*    Cardiac Enzymes  Recent Labs Lab 08/28/16 1400 08/28/16 2024 08/29/16 0240  TROPONINI <0.03 <0.03 <0.03    Microbiology Results  @[email or web address] @  RADIOLOGY:  Dg Chest 2 View  Result Date: 08/27/2016 CLINICAL DATA:  Chest pain EXAM: CHEST  2 VIEW COMPARISON:  06/01/2016 FINDINGS: Chronic cardiomegaly. There is a biventricular ICD/ pacer from the left with 2 right ventricular leads. There is no edema, consolidation, effusion, or pneumothorax. Kyphosis with chronic compression fracture at T12. IMPRESSION: 1. No evidence of active disease. 2. Cardiomegaly. Electronically Signed   By: Marnee Spring M.D.   On: 08/27/2016 15:46   Ct Angio Low Extrem Right W &/or Wo  Contrast  Result Date: 08/27/2016 CLINICAL DATA:  Right lower extremity pain and swelling after recent steroid injection. EXAM: CT ANGIOGRAPHY OF THE RIGHT LOWER EXTREMITY TECHNIQUE: Multidetector CT imaging of the right/left upper/lowerwas performed using the standard protocol during bolus administration of intravenous contrast. Multiplanar CT image reconstructions and MIPs were obtained to evaluate the vascular anatomy. CONTRAST:  100 mL Isovue 370 IV COMPARISON:  None. FINDINGS: The dominant finding is a large intramuscular hematoma within the quadriceps musculature and primarily centered within the vastus intermedius and lateralis muscles. The hematoma begins in the proximal thigh just below the hip joint and continues into the distal thigh. No soft tissue gas identified. Arterial supply to the right lower extremity is intact and there is no evidence of arterial injury or arterial contrast extravasation. Scattered calcified plaque is noted in the common femoral and superficial femoral arteries without evidence of significant obstructive disease. Calcified plaque is present in the popliteal artery without significant stenosis. Underlying bony structures are intact and without evidence of fracture or lesion. No soft tissue foreign body is identified. Review of the MIP images confirms the above findings. IMPRESSION: Large right thigh intramuscular hematoma of the quadriceps musculature primarily centered within the vastus intermedius and lateralis muscles and extending from the proximal thigh to distal thigh. No arterial injury or contrast extravasation identified. Electronically Signed   By: Irish Lack M.D.   On: 08/27/2016 17:44      Current Discharge Medication List    START taking these medications   Details  ciprofloxacin (CIPRO) 500 MG tablet Take 1 tablet (500 mg total) by mouth 2 (two) times daily. Qty: 6 tablet, Refills: 0    ferrous sulfate 325 (65 FE) MG tablet Take 1 tablet (325 mg  total) by mouth daily with breakfast. Qty: 30 tablet, Refills: 0      CONTINUE these medications which have CHANGED   Details  furosemide (LASIX) 20 MG tablet Take 1 tablet (20 mg total) by mouth daily as needed for fluid.      CONTINUE these medications which have NOT CHANGED   Details  carvedilol (COREG) 25 MG tablet TAKE ONE TABLET BY MOUTH TWICE DAILY WITH  MEALS    diphenoxylate-atropine (LOMOTIL) 2.5-0.025 MG tablet Take 1-2 tablets by mouth 4 (four) times daily as needed for diarrhea or loose stools. Qty: 40 tablet, Refills: 0    gabapentin (NEURONTIN) 300 MG capsule Take 300  mg by mouth at bedtime.     HYDROcodone-acetaminophen (NORCO/VICODIN) 5-325 MG tablet Limit one half to one tab by mouth per day or 2-3 times per day if tolerated Qty: 80 tablet, Refills: 0    levothyroxine (SYNTHROID, LEVOTHROID) 25 MCG tablet Take 25 mcg by mouth daily before breakfast.    loperamide (ANTI-DIARRHEAL) 2 MG capsule Take 2 mg by mouth as needed for diarrhea or loose stools.    losartan (COZAAR) 25 MG tablet TAKE ONE TABLET EVERY DAY    nitroGLYCERIN (NITROSTAT) 0.4 MG SL tablet Place 0.4 mg under the tongue every 5 (five) minutes as needed for chest pain.     ondansetron (ZOFRAN) 4 MG tablet Take 4 mg by mouth every 8 (eight) hours as needed.     silver sulfADIAZINE (SILVADENE) 1 % cream Apply 1 application topically 2 (two) times daily. Apply to area twice a day Qty: 25 g, Refills: 2    temazepam (RESTORIL) 7.5 MG capsule Take 7.5 mg by mouth at bedtime.     vitamin B-12 (CYANOCOBALAMIN) 1000 MCG tablet Take 1,000 mcg by mouth daily.    budesonide (ENTOCORT EC) 3 MG 24 hr capsule Take 3 mg by mouth daily.     docusate sodium (COLACE) 100 MG capsule Take 1 capsule (100 mg total) by mouth 2 (two) times daily as needed (take to keep stool soft.). Qty: 60 capsule, Refills: 0      STOP taking these medications     aspirin EC 81 MG EC tablet      warfarin (COUMADIN) 2.5 MG tablet            Management plans discussed with the patient and she is in agreement. Stable for discharge home with [facility]  Patient should follow up with pcp  CODE STATUS:     Code Status Orders        Start     Ordered   08/27/16 1915  Do not attempt resuscitation (DNR)  Continuous    Question Answer Comment  In the event of cardiac or respiratory ARREST Do not call a "code blue"   In the event of cardiac or respiratory ARREST Do not perform Intubation, CPR, defibrillation or ACLS   In the event of cardiac or respiratory ARREST Use medication by any route, position, wound care, and other measures to relive pain and suffering. May use oxygen, suction and manual treatment of airway obstruction as needed for comfort.      08/27/16 1914    Code Status History    Date Active Date Inactive Code Status Order ID Comments User Context   06/01/2016  7:07 PM 06/03/2016  5:39 PM DNR [id number]  Ramonita Lab, MD ED   03/23/2016  3:12 PM 03/24/2016 11:46 PM Full Code [id number]  Renford Dills, MD Inpatient   03/23/2016  2:30 PM 03/23/2016  3:03 PM Full Code [id number]  Gilda Crease, Latina Craver, MD Inpatient   03/11/2015  4:35 PM 03/18/2015  5:41 PM Full Code [id number]  Ramonita Lab, MD Inpatient   03/11/2015  2:38 PM 03/11/2015  4:35 PM Full Code [id number]  Ramonita Lab, MD ED   10/08/2014 10:50 AM 10/08/2014  5:12 PM Full Code [id number]  Schnier, Latina Craver, MD Inpatient    Advance Directive Documentation     Most Recent Value  Type of Advance Directive  Living will  Pre-existing out of facility DNR order (yellow form or pink MOST form)  -  "MOST" Form in Place?  -  TOTAL TIME TAKING CARE OF THIS PATIENT: 37 minutes.    Note: This dictation was prepared with Dragon dictation along with smaller phrase technology. Any transcriptional errors that result from this process are unintentional.  Quentez Lober M.D on 08/29/2016 at 9:50 AM  Between 7am to 6pm - Pager - [phone] After 6pm go to  [email or web address] - Social research officer, government  [facility]  Office  [phone]  CC: Primary care physician; Patrice Paradise, MD

## 2016-08-30 LAB — BPAM RBC
BLOOD PRODUCT EXPIRATION DATE: 201805182359
BLOOD PRODUCT EXPIRATION DATE: 201805242359
ISSUE DATE / TIME: 201805051201
ISSUE DATE / TIME: 201805061057
UNIT TYPE AND RH: 9500
Unit Type and Rh: 9500

## 2016-08-30 LAB — TYPE AND SCREEN
ABO/RH(D): O NEG
Antibody Screen: NEGATIVE
Unit division: 0
Unit division: 0

## 2016-08-30 LAB — PREPARE RBC (CROSSMATCH)

## 2016-08-30 NOTE — Progress Notes (Signed)
[facility]  Patient Status: Spoke with patient and she thought this would be a 1 time visit with the nurse for blood draw. Patient did not want HH services (SN & PT) due to copay. MD has been notified. [name], CM also notified.    Joanna Roberts 08/30/2016, 10:43 AM

## 2016-08-31 ENCOUNTER — Ambulatory Visit (INDEPENDENT_AMBULATORY_CARE_PROVIDER_SITE_OTHER): Payer: PPO | Admitting: Vascular Surgery

## 2016-09-02 DIAGNOSIS — M79604 Pain in right leg: Secondary | ICD-10-CM | POA: Diagnosis not present

## 2016-09-02 DIAGNOSIS — R600 Localized edema: Secondary | ICD-10-CM | POA: Diagnosis not present

## 2016-09-02 DIAGNOSIS — I482 Chronic atrial fibrillation: Secondary | ICD-10-CM | POA: Diagnosis not present

## 2016-09-02 DIAGNOSIS — S8011XD Contusion of right lower leg, subsequent encounter: Secondary | ICD-10-CM | POA: Diagnosis not present

## 2016-09-02 DIAGNOSIS — D500 Iron deficiency anemia secondary to blood loss (chronic): Secondary | ICD-10-CM | POA: Diagnosis not present

## 2016-09-03 NOTE — ED Provider Notes (Signed)
[facility] Emergency Department Provider Note   ____________________________________________   First MD Initiated Contact with Patient 08/27/16 1511     (approximate)  I have reviewed the triage vital signs and the nursing notes.   HISTORY  Chief Complaint Leg Pain    HPI Joanna Roberts is a 83 y.o. female here for evaluation of right leg pain.  Patient reports severe right leg pain with swelling starting today. Rather abrupt in onset. Associates a recent injection into the right hip due to chronic joint disease.  She reports her right leg began swelling, became very painful, and she reports severe pain and swelling mostly from the knee up on the right side. She is not able to walk due to pain. Denies loss of sensation in the lower foot or cold blue or pale foot.  This is never happened before. She does take Coumadin  No chest pain or trouble breathing.   Past Medical History:  Diagnosis Date  . AICD (automatic cardioverter/defibrillator) present   . Allergy   . Anemia   . Arthritis   . Cataract   . CHF (congestive heart failure) (HCC)   . Depression   . Dysrhythmia   . GERD (gastroesophageal reflux disease)   . Heart murmur   . Hypertension   . Myocardial infarction (HCC)   . Osteoporosis   . Presence of permanent cardiac pacemaker   . Thyroid disease     Patient Active Problem List   Diagnosis Date Noted  . Hematoma of left thigh 08/27/2016  . Lymphedema 07/29/2016  . Protein-calorie malnutrition, severe 06/03/2016  . Sepsis (HCC) 06/01/2016  . Varicose veins of left leg with both ulcer and inflammation (HCC) 05/27/2016  . Acute blood loss anemia 04/01/2016  . Hematoma of groin 03/23/2016  . Chronic mesenteric ischemia (HCC) 03/15/2016  . Atherosclerosis of native arteries of extremity with intermittent claudication (HCC) 02/12/2016  . Chronic venous insufficiency 02/12/2016  . DDD (degenerative disc disease), lumbar 11/18/2015    . Facet syndrome, lumbar (HCC) 11/18/2015  . Sacroiliac joint dysfunction 11/18/2015  . Spinal stenosis, lumbar region, with neurogenic claudication 11/18/2015  . Lumbar radiculopathy 11/18/2015  . Compression fracture of thoracic spine, non-traumatic (HCC) 11/18/2015  . Idiopathic scoliosis 11/18/2015  . H/O varicose vein stripping 11/18/2015  . Acquired hypothyroidism 09/17/2015  . Atrial fibrillation (HCC) 09/17/2015  . Cardiomyopathy (HCC) 09/17/2015  . Back pain, chronic 09/17/2015  . DD (diverticular disease) 09/17/2015  . Aggrieved 09/17/2015  . 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 History of colon polyps 09/17/2015  . Personal history of urinary infection 09/17/2015  . Cannot sleep 09/17/2015  . Esophagitis, reflux 09/17/2015  . Tachycardia-bradycardia (HCC) 09/17/2015  . A-fib (HCC) 03/18/2015  . Pneumonia 03/11/2015  . B12 deficiency 11/27/2014  . Chronic anemia 11/26/2014  . Incomplete bladder emptying 06/24/2014  . Frequent UTI 06/24/2014  . History of urinary anomaly 06/21/2014  . FOM (frequency of micturition) 06/21/2014  . Vascular disorder of lower extremity 04/25/2014  . Chronic abdominal pain 01/17/2014  . CN (constipation) 01/17/2014  . Fatty infiltration of liver 01/17/2014  . Pain in rib 11/06/2013  . Temporary cerebral vascular dysfunction 11/06/2013    Past Surgical History:  Procedure Laterality Date  . ABDOMINAL HYSTERECTOMY    . APPENDECTOMY    . HERNIA REPAIR    . INSERT / REPLACE / REMOVE PACEMAKER    . PERIPHERAL VASCULAR CATHETERIZATION Right 10/08/2014   Procedure: Lower Extremity Angiography;  Surgeon: Renford Dills, MD;  Location: [facility] INVASIVE CV LAB;  Service: Cardiovascular;  Laterality: Right;  . PERIPHERAL VASCULAR CATHETERIZATION  10/08/2014   Procedure: Lower Extremity Intervention;  Surgeon: Renford Dills, MD;  Location: [facility] INVASIVE CV LAB;  Service: Cardiovascular;;  . PERIPHERAL VASCULAR CATHETERIZATION N/A 03/23/2016    Procedure: Visceral Angiography;  Surgeon: Renford Dills, MD;  Location: [facility] INVASIVE CV LAB;  Service: Cardiovascular;  Laterality: N/A;    Prior to Admission medications   Medication Sig Start Date End Date Taking? Authorizing Provider  carvedilol (COREG) 25 MG tablet TAKE ONE TABLET BY MOUTH TWICE DAILY WITH  MEALS 06/26/15  Yes [provider]  diphenoxylate-atropine (LOMOTIL) 2.5-0.025 MG tablet Take 1-2 tablets by mouth 4 (four) times daily as needed for diarrhea or loose stools. 04/08/16  Yes Schnier, Latina Craver, MD  gabapentin (NEURONTIN) 300 MG capsule Take 300 mg by mouth at bedtime.  03/16/16  Yes [provider]  HYDROcodone-acetaminophen (NORCO/VICODIN) 5-325 MG tablet Limit one half to one tab by mouth per day or 2-3 times per day if tolerated 12/09/15  Yes Ewing Schlein, MD  levothyroxine (SYNTHROID, LEVOTHROID) 25 MCG tablet Take 25 mcg by mouth daily before breakfast.   Yes [provider]  loperamide (ANTI-DIARRHEAL) 2 MG capsule Take 2 mg by mouth as needed for diarrhea or loose stools.   Yes [provider]  losartan (COZAAR) 25 MG tablet TAKE ONE TABLET EVERY DAY 07/30/15  Yes [provider]  nitroGLYCERIN (NITROSTAT) 0.4 MG SL tablet Place 0.4 mg under the tongue every 5 (five) minutes as needed for chest pain.    Yes [provider]  ondansetron (ZOFRAN) 4 MG tablet Take 4 mg by mouth every 8 (eight) hours as needed.  03/30/16  Yes [provider]  silver sulfADIAZINE (SILVADENE) 1 % cream Apply 1 application topically 2 (two) times daily. Apply to area twice a day 08/24/16  Yes Stegmayer, Cala Bradford A, PA-C  temazepam (RESTORIL) 7.5 MG capsule Take 7.5 mg by mouth at bedtime.  03/17/16  Yes [provider]  vitamin B-12 (CYANOCOBALAMIN) 1000 MCG tablet Take 1,000 mcg by mouth daily.   Yes [provider]  budesonide (ENTOCORT EC) 3 MG 24 hr capsule Take 3 mg by mouth daily.  04/14/16   [provider]  docusate sodium (COLACE) 100 MG capsule Take 1 capsule (100 mg total) by mouth 2 (two) times daily as needed (take to keep stool soft.). Patient not taking: Reported on 08/27/2016 09/17/15   Vanna Scotland, MD  ferrous sulfate 325 (65 FE) MG tablet Take 1 tablet (325 mg total) by mouth daily with breakfast. 08/29/16   Adrian Saran, MD  furosemide (LASIX) 20 MG tablet Take 1 tablet (20 mg total) by mouth daily as needed for fluid. 08/29/16   Adrian Saran, MD    Allergies Lortab [hydrocodone-acetaminophen]; Doxycycline; Macrobid [nitrofurantoin monohyd macro]; and Nitrofurantoin  Family History  Problem Relation Age of Onset  . Early death Mother   . Heart disease Mother   . Arthritis Father   . Asthma Father   . Stroke Sister     Social History Social History  Substance Use Topics  . Smoking status: Never Smoker  . Smokeless tobacco: Never Used  . Alcohol use No    Review of Systems Constitutional: No fever/chills Eyes: No visual changes. ENT: No sore throat. Cardiovascular: Denies chest pain. Respiratory: Denies shortness of breath. Gastrointestinal: No abdominal pain.  No nausea, no vomiting.  Musculoskeletal: Negative for back pain.See history  of present illness. Denies any problems with the left lower leg. Skin: Negative for rash. Neurological: Negative for headaches, focal weakness or numbness.  10-point ROS otherwise negative.  ____________________________________________   PHYSICAL EXAM:  VITAL SIGNS: ED Triage Vitals  Enc Vitals Group     BP 08/27/16 1428 124/61     Pulse Rate 08/27/16 1428 75     Resp 08/27/16 1428 18     Temp 08/27/16 1428 98.4 F (36.9 C)     Temp Source 08/27/16 1428 Oral     SpO2 08/27/16 1428 97 %     Weight 08/27/16 1422 135 lb (61.2 kg)     Height 08/27/16 1422 5\' 1"  (1.549 m)     Head Circumference --      Peak Flow --      Pain Score 08/27/16 1422 10     Pain Loc --      Pain Edu? --      Excl. in GC? --      Constitutional: Alert and oriented. Appears in moderate pain, reports pain and tenderness in the right thigh. Eyes: Conjunctivae are normal. PERRL. EOMI. Head: Atraumatic. Nose: No congestion/rhinnorhea. Mouth/Throat: Mucous membranes are moist.  Oropharynx non-erythematous. Neck: No stridor.   Cardiovascular: Irregular rhythm, regular rate. Grossly normal heart sounds.  Good peripheral circulation. Respiratory: Normal respiratory effort.  No retractions. Lungs CTAB. Gastrointestinal: Soft and nontender. Musculoskeletal: The left lower leg is atraumatic, distal pulses intact, able to move well without pain or discomfort. The right lower leg demonstrates significant swelling across the anterior thigh. Possible hematoma versus fluid-filled cyst over the right thigh which appears indurated. No overlying edema. She has a palpable dorsalis pedis pulse in the right lower extremity with normal capillary refill. Question if she has a tight right thigh compartment anteriorly. Neurologic:  Normal speech and language. No gross focal neurologic deficits are appreciated. Skin:  Skin is warm, dry and intact. No rash noted. Psychiatric: Mood and affect are normal. Speech and behavior are normal.  ____________________________________________   LABS (all labs ordered are listed, but only abnormal results are displayed)  Labs Reviewed  BASIC METABOLIC PANEL - Abnormal; Notable for the following:       Result Value   Glucose, Bld 137 (*)    BUN 31 (*)    GFR calc non Af Amer 54 (*)    All other components within normal limits  CBC - Abnormal; Notable for the following:    RBC 2.89 (*)    Hemoglobin 8.7 (*)    HCT 27.0 (*)    RDW 15.4 (*)    All other components within normal limits  PROTIME-INR - Abnormal; Notable for the following:    Prothrombin Time 38.2 (*)    All other components within normal limits  APTT - Abnormal; Notable for the following:    aPTT 38 (*)    All other components  within normal limits  PROTIME-INR - Abnormal; Notable for the following:    Prothrombin Time 18.6 (*)    All other components within normal limits  BASIC METABOLIC PANEL - Abnormal; Notable for the following:    Sodium 134 (*)    Glucose, Bld 140 (*)    BUN 31 (*)    Calcium 8.5 (*)    GFR calc non Af Amer 52 (*)    Anion gap 4 (*)    All other components within normal limits  CBC - Abnormal; Notable for the following:    WBC 12.1 (*)  RBC 2.24 (*)    Hemoglobin 6.6 (*)    HCT 20.9 (*)    MCHC 31.5 (*)    RDW 15.7 (*)    Platelets 141 (*)    All other components within normal limits  PROTIME-INR - Abnormal; Notable for the following:    Prothrombin Time 17.4 (*)    All other components within normal limits  PROTIME-INR - Abnormal; Notable for the following:    Prothrombin Time 16.0 (*)    All other components within normal limits  URINALYSIS, ROUTINE W REFLEX MICROSCOPIC - Abnormal; Notable for the following:    Color, Urine YELLOW (*)    APPearance HAZY (*)    Leukocytes, UA LARGE (*)    Squamous Epithelial / LPF 0-5 (*)    All other components within normal limits  CBC - Abnormal; Notable for the following:    WBC 11.3 (*)    RBC 2.25 (*)    Hemoglobin 6.6 (*)    HCT 21.1 (*)    MCHC 31.2 (*)    RDW 15.5 (*)    Platelets 146 (*)    All other components within normal limits  HEMOGLOBIN AND HEMATOCRIT, BLOOD - Abnormal; Notable for the following:    Hemoglobin 7.8 (*)    HCT 23.6 (*)    All other components within normal limits  PROTIME-INR - Abnormal; Notable for the following:    Prothrombin Time 15.6 (*)    All other components within normal limits  CBC - Abnormal; Notable for the following:    WBC 12.1 (*)    RBC 2.44 (*)    Hemoglobin 7.2 (*)    HCT 22.0 (*)    RDW 16.4 (*)    Platelets 120 (*)    All other components within normal limits  BASIC METABOLIC PANEL - Abnormal; Notable for the following:    Glucose, Bld 117 (*)    BUN 32 (*)     Creatinine, Ser 1.01 (*)    Calcium 8.2 (*)    GFR calc non Af Amer 49 (*)    GFR calc Af Amer 57 (*)    All other components within normal limits  HEMOGLOBIN - Abnormal; Notable for the following:    Hemoglobin 9.3 (*)    All other components within normal limits  TROPONIN I  PROTIME-INR  PROTIME-INR  TROPONIN I  TROPONIN I  TROPONIN I  TROPONIN I  TYPE AND SCREEN  PREPARE RBC (CROSSMATCH)  ABO/RH  PREPARE RBC (CROSSMATCH)   ____________________________________________  EKG  EKG reviewed and interpreted by me at 1435 Ventricular paced at a rate of about 75 bpm QTc approximately 500 Reviewed and interpreted as ventricular paced rhythm ____________________________________________  RADIOLOGY   Ct Angio Low Extrem Right W &/or Wo Contrast  Result Date: 08/27/2016 CLINICAL DATA:  Right lower extremity pain and swelling after recent steroid injection. EXAM: CT ANGIOGRAPHY OF THE RIGHT LOWER EXTREMITY TECHNIQUE: Multidetector CT imaging of the right/left upper/lowerwas performed using the standard protocol during bolus administration of intravenous contrast. Multiplanar CT image reconstructions and MIPs were obtained to evaluate the vascular anatomy. CONTRAST:  100 mL Isovue 370 IV COMPARISON:  None. FINDINGS: The dominant finding is a large intramuscular hematoma within the quadriceps musculature and primarily centered within the vastus intermedius and lateralis muscles. The hematoma begins in the proximal thigh just below the hip joint and continues into the distal thigh. No soft tissue gas identified. Arterial supply to the right lower extremity is intact and there  is no evidence of arterial injury or arterial contrast extravasation. Scattered calcified plaque is noted in the common femoral and superficial femoral arteries without evidence of significant obstructive disease. Calcified plaque is present in the popliteal artery without significant stenosis. Underlying bony structures are  intact and without evidence of fracture or lesion. No soft tissue foreign body is identified. Review of the MIP images confirms the above findings. IMPRESSION: Large right thigh intramuscular hematoma of the quadriceps musculature primarily centered within the vastus intermedius and lateralis muscles and extending from the proximal thigh to distal thigh. No arterial injury or contrast extravasation identified. Electronically Signed   By: Irish Lack M.D.   On: 08/27/2016 17:44   ____________________________________________   PROCEDURES  Procedure(s) performed: None  Procedures  Critical Care performed: Yes, see critical care note(s)  CRITICAL CARE Performed by: Sharyn Creamer   Total critical care time: 40 minutes  Critical care time was exclusive of separately billable procedures and treating other patients.  Critical care was necessary to treat or prevent imminent or life-threatening deterioration.  Critical care was time spent personally by me on the following activities: development of treatment plan with patient and/or surrogate as well as nursing, discussions with consultants, evaluation of patient's response to treatment, examination of patient, obtaining history from patient or surrogate, ordering and performing treatments and interventions, ordering and review of laboratory studies, ordering and review of radiographic studies, pulse oximetry and re-evaluation of patient's condition.  Patient on Coumadin for A. fib, large right thigh hematoma with elevated INR requiring emergency reversal of anticoagulation, emergent consultation with orthopedics to exclude compartment syndrome and need for possible fasciotomy. Patient felt to be at risk of life-threatening hemorrhage requiring admission and close monitoring and reversal. ____________________________________________   INITIAL IMPRESSION / ASSESSMENT AND PLAN / ED COURSE  Pertinent labs & imaging results that were available  during my care of the patient were reviewed by me and considered in my medical decision making (see chart for details).  Sudden onset severe right leg pain, supratherapeutic INR with evidence on CT of a large right thigh hematoma. Patient requiring emergency reversal of her anticoagulation due to life-threatening bleeding within the right thigh. Orthopedics consult appreciated, patient admitted to medical service with orthopedics following closely.      ____________________________________________   FINAL CLINICAL IMPRESSION(S) / ED DIAGNOSES  Final diagnoses:  Right leg swelling  Right leg pain      NEW MEDICATIONS STARTED DURING THIS VISIT:  Discharge Medication List as of 08/29/2016  3:58 PM    START taking these medications   Details  ciprofloxacin (CIPRO) 500 MG tablet Take 1 tablet (500 mg total) by mouth 2 (two) times daily., Starting Sun 08/29/2016, Until Wed 09/01/2016, Print    ferrous sulfate 325 (65 FE) MG tablet Take 1 tablet (325 mg total) by mouth daily with breakfast., Starting Sun 08/29/2016, Print         Note:  This document was prepared using Dragon voice recognition software and may include unintentional dictation errors.     Sharyn Creamer, MD 09/03/16 [phone]

## 2016-09-06 DIAGNOSIS — I890 Lymphedema, not elsewhere classified: Secondary | ICD-10-CM | POA: Diagnosis not present

## 2016-09-06 DIAGNOSIS — R233 Spontaneous ecchymoses: Secondary | ICD-10-CM | POA: Diagnosis not present

## 2016-09-07 DIAGNOSIS — D649 Anemia, unspecified: Secondary | ICD-10-CM | POA: Diagnosis not present

## 2016-09-09 ENCOUNTER — Other Ambulatory Visit: Payer: Self-pay | Admitting: Cardiology

## 2016-09-09 ENCOUNTER — Ambulatory Visit: Admission: RE | Admit: 2016-09-09 | Payer: PPO | Source: Ambulatory Visit

## 2016-09-09 ENCOUNTER — Ambulatory Visit
Admission: RE | Admit: 2016-09-09 | Discharge: 2016-09-09 | Disposition: A | Discharge status: Home / Self Care | Payer: PPO | Source: Ambulatory Visit | Attending: Cardiology | Admitting: Cardiology

## 2016-09-09 DIAGNOSIS — I739 Peripheral vascular disease, unspecified: Secondary | ICD-10-CM | POA: Diagnosis not present

## 2016-09-09 DIAGNOSIS — S7011XA Contusion of right thigh, initial encounter: Secondary | ICD-10-CM

## 2016-09-09 DIAGNOSIS — I482 Chronic atrial fibrillation: Secondary | ICD-10-CM | POA: Diagnosis not present

## 2016-09-09 DIAGNOSIS — S7011XD Contusion of right thigh, subsequent encounter: Secondary | ICD-10-CM | POA: Diagnosis not present

## 2016-09-09 DIAGNOSIS — R600 Localized edema: Secondary | ICD-10-CM | POA: Diagnosis not present

## 2016-09-09 DIAGNOSIS — I10 Essential (primary) hypertension: Secondary | ICD-10-CM | POA: Diagnosis not present

## 2016-09-09 DIAGNOSIS — I429 Cardiomyopathy, unspecified: Secondary | ICD-10-CM | POA: Diagnosis not present

## 2016-09-09 DIAGNOSIS — M79604 Pain in right leg: Secondary | ICD-10-CM | POA: Diagnosis not present

## 2016-09-09 DIAGNOSIS — I70219 Atherosclerosis of native arteries of extremities with intermittent claudication, unspecified extremity: Secondary | ICD-10-CM | POA: Diagnosis not present

## 2016-09-09 DIAGNOSIS — I495 Sick sinus syndrome: Secondary | ICD-10-CM | POA: Diagnosis not present

## 2016-09-09 DIAGNOSIS — R2241 Localized swelling, mass and lump, right lower limb: Secondary | ICD-10-CM | POA: Diagnosis not present

## 2016-09-09 MED ORDER — IOPAMIDOL (ISOVUE-370) INJECTION 76%
100.0000 mL | Freq: Once | INTRAVENOUS | Status: AC | PRN
  Administered 2016-09-09: 100 mL via INTRAVENOUS

## 2016-09-13 ENCOUNTER — Ambulatory Visit (INDEPENDENT_AMBULATORY_CARE_PROVIDER_SITE_OTHER): Payer: PPO | Admitting: Vascular Surgery

## 2016-09-13 ENCOUNTER — Encounter (INDEPENDENT_AMBULATORY_CARE_PROVIDER_SITE_OTHER): Payer: Self-pay | Admitting: Vascular Surgery

## 2016-09-13 VITALS — BP 127/79 | HR 70 | Resp 15 | Ht 63.0 in | Wt 132.0 lb

## 2016-09-13 DIAGNOSIS — I83229 Varicose veins of left lower extremity with both ulcer of unspecified site and inflammation: Secondary | ICD-10-CM | POA: Diagnosis not present

## 2016-09-13 DIAGNOSIS — I872 Venous insufficiency (chronic) (peripheral): Secondary | ICD-10-CM | POA: Diagnosis not present

## 2016-09-13 DIAGNOSIS — I482 Chronic atrial fibrillation, unspecified: Secondary | ICD-10-CM

## 2016-09-13 DIAGNOSIS — I70213 Atherosclerosis of native arteries of extremities with intermittent claudication, bilateral legs: Secondary | ICD-10-CM

## 2016-09-13 DIAGNOSIS — S8011XA Contusion of right lower leg, initial encounter: Secondary | ICD-10-CM | POA: Diagnosis not present

## 2016-09-13 DIAGNOSIS — L97929 Non-pressure chronic ulcer of unspecified part of left lower leg with unspecified severity: Secondary | ICD-10-CM

## 2016-09-13 DIAGNOSIS — M5136 Other intervertebral disc degeneration, lumbar region: Secondary | ICD-10-CM | POA: Diagnosis not present

## 2016-09-14 ENCOUNTER — Other Ambulatory Visit (INDEPENDENT_AMBULATORY_CARE_PROVIDER_SITE_OTHER): Payer: Self-pay | Admitting: Vascular Surgery

## 2016-09-14 NOTE — Progress Notes (Signed)
MRN : [id number]  Joanna Roberts is a 83 y.o. (1928-06-29) female who presents with chief complaint of  Chief Complaint  Patient presents with  . Re-evaluation    CT results, check ulcer  .  History of Present Illness: The patient is seen for evaluation of painful right lower extremity.  See sustained a large right thigh hematoma a couple weeks ago.  She notes it occurred spontaneously but was on Coumadin for her afib.  The pain is consistent day to day occurring throughout the day consistently. The patient notes the pain also occurs with standing and and sitting routinely seems worse as the day wears on. The pain has been progressive over the past several days. The patient states these symptoms are causing  a profound negative impact on quality of life and daily activities, she states she can't even sleep.  The patient denies rest pain or dangling of an extremity off the side of the bed during the night for relief. No open wounds or sores at this time. No history of DVT or phlebitis. No prior interventions or surgeries.  There is a  history of back problems and DJD of the lumbar and sacral spine.   No outpatient prescriptions have been marked as taking for the 09/13/16 encounter (Office Visit) with Gilda Crease, Latina Craver, MD.   Current Facility-Administered Medications for the 09/13/16 encounter (Office Visit) with Gilda Crease, Latina Craver, MD  Medication  . mirabegron ER (MYRBETRIQ) tablet 25 mg    Past Medical History:  Diagnosis Date  . AICD (automatic cardioverter/defibrillator) present   . Allergy   . Anemia   . Arthritis   . Cataract   . CHF (congestive heart failure) (HCC)   . Depression   . Dysrhythmia   . GERD (gastroesophageal reflux disease)   . Heart murmur   . Hypertension   . Myocardial infarction (HCC)   . Osteoporosis   . Presence of permanent cardiac pacemaker   . Thyroid disease     Past Surgical History:  Procedure Laterality Date  . ABDOMINAL HYSTERECTOMY      . APPENDECTOMY    . HERNIA REPAIR    . INSERT / REPLACE / REMOVE PACEMAKER    . PERIPHERAL VASCULAR CATHETERIZATION Right 10/08/2014   Procedure: Lower Extremity Angiography;  Surgeon: Renford Dills, MD;  Location: [facility];  Service: Cardiovascular;  Laterality: Right;  . PERIPHERAL VASCULAR CATHETERIZATION  10/08/2014   Procedure: Lower Extremity Intervention;  Surgeon: Renford Dills, MD;  Location: [facility];  Service: Cardiovascular;;  . PERIPHERAL VASCULAR CATHETERIZATION N/A 03/23/2016   Procedure: Visceral Angiography;  Surgeon: Renford Dills, MD;  Location: [facility];  Service: Cardiovascular;  Laterality: N/A;    Social History Social History  Substance Use Topics  . Smoking status: Never Smoker  . Smokeless tobacco: Never Used  . Alcohol use No    Family History Family History  Problem Relation Age of Onset  . Early death Mother   . Heart disease Mother   . Arthritis Father   . Asthma Father   . Stroke Sister     Allergies  Allergen Reactions  . Lortab [Hydrocodone-Acetaminophen] Itching, Swelling and Rash  . Doxycycline Diarrhea  . Macrobid [Nitrofurantoin Monohyd Macro] Diarrhea and Nausea And Vomiting  . Nitrofurantoin Diarrhea, Nausea Only and Nausea And Vomiting     REVIEW OF SYSTEMS (Negative unless checked)  Constitutional: [] Weight loss  [] Fever  [] Chills Cardiac: [] Chest pain   []   Chest pressure   [] Palpitations   [] Shortness of breath when laying flat   [] Shortness of breath with exertion. Vascular:  [x] Pain in legs with walking   [x] Pain in legs at rest  [] History of DVT   [] Phlebitis   [x] Swelling in legs   [] Varicose veins   [] Non-healing ulcers Pulmonary:   [] Uses home oxygen   [] Productive cough   [] Hemoptysis   [] Wheeze  [] COPD   [] Asthma Neurologic:  [] Dizziness   [] Seizures   [] History of stroke   [] History of TIA  [] Aphasia   [] Vissual changes   [] Weakness or numbness in arm   [] Weakness or numbness  in leg Musculoskeletal:   [] Joint swelling   [x] Joint pain   [x] Low back pain Hematologic:  [x] Easy bruising  [] Easy bleeding   [] Hypercoagulable state   [] Anemic Gastrointestinal:  [] Diarrhea   [] Vomiting  [] Gastroesophageal reflux/heartburn   [] Difficulty swallowing. Genitourinary:  [] Chronic kidney disease   [] Difficult urination  [] Frequent urination   [] Blood in urine Skin:  [] Rashes   [] Ulcers  Psychological:  [] History of anxiety   []  History of major depression.  Physical Examination  Vitals:   09/13/16 1558  BP: 127/79  Pulse: 70  Resp: 15  Weight: 59.9 kg (132 lb)  Height: 5\' 3"  (1.6 m)   Body mass index is 23.38 kg/m. Gen: WD/WN, NAD Head: [place]/AT, No temporalis wasting.  Ear/Nose/Throat: Hearing grossly intact, nares w/o erythema or drainage Eyes: PER, EOMI, sclera nonicteric.  Neck: Supple, no large masses.   Pulmonary:  Good air movement, no audible wheezing bilaterally, no use of accessory muscles.  Cardiac: RRR, no JVD Vascular: massive hematoma of the right leg with ecchymosis that extends from the thigh to the ankle.  Below the knee the skin is tense and shinny her entire leg is tender to the touch. Vessel Right Left  Radial Palpable Palpable  Ulnar Palpable Palpable  Brachial Palpable Palpable  Carotid Palpable Palpable  Femoral Palpable Palpable  Popliteal Not Palpable Not Palpable  PT Not Palpable Not Palpable  DP Not Palpable Not Palpable  Gastrointestinal: Non-distended. No guarding/no peritoneal signs.  Musculoskeletal: M/S 5/5 throughout.  No deformity or atrophy.  Neurologic: CN 2-12 intact. Symmetrical.  Speech is fluent. Motor exam as listed above. Psychiatric: Judgment intact, Mood & affect appropriate for pt's clinical situation. Dermatologic: No rashes or ulcers noted.  No changes consistent with cellulitis. Lymph : No lichenification or skin changes of chronic lymphedema.  CBC Lab Results  Component Value Date   WBC 12.1 (H) 08/29/2016    HGB 9.3 (L) 08/29/2016   HCT 22.0 (L) 08/29/2016   MCV 90.5 08/29/2016   PLT 120 (L) 08/29/2016    BMET    Component Value Date/Time   NA 136 08/29/2016 0240   NA 140 04/23/2014 1320   K 4.8 08/29/2016 0240   K 4.8 04/23/2014 1320   CL 106 08/29/2016 0240   CL 109 (H) 04/23/2014 1320   CO2 25 08/29/2016 0240   CO2 26 04/23/2014 1320   GLUCOSE 117 (H) 08/29/2016 0240   GLUCOSE 95 04/23/2014 1320   BUN 32 (H) 08/29/2016 0240   BUN 22 (H) 04/23/2014 1320   CREATININE 1.01 (H) 08/29/2016 0240   CREATININE 1.02 04/23/2014 1320   CALCIUM 8.2 (L) 08/29/2016 0240   CALCIUM 8.5 04/23/2014 1320   GFRNONAA 49 (L) 08/29/2016 0240   GFRNONAA 55 (L) 04/23/2014 1320   GFRNONAA 39 (L) 11/02/2013 0540   GFRAA 57 (L) 08/29/2016 0240   GFRAA >60  04/23/2014 1320   GFRAA 45 (L) 11/02/2013 0540   Estimated Creatinine Clearance: 33.7 mL/min (A) (by C-G formula based on SCr of 1.01 mg/dL (H)).  COAG Lab Results  Component Value Date   INR 1.23 08/29/2016   INR 1.17 08/28/2016   INR 1.15 08/28/2016    Radiology Dg Chest 2 View  Result Date: 08/27/2016 CLINICAL DATA:  Chest pain EXAM: CHEST  2 VIEW COMPARISON:  06/01/2016 FINDINGS: Chronic cardiomegaly. There is a biventricular ICD/ pacer from the left with 2 right ventricular leads. There is no edema, consolidation, effusion, or pneumothorax. Kyphosis with chronic compression fracture at T12. IMPRESSION: 1. No evidence of active disease. 2. Cardiomegaly. Electronically Signed   By: Marnee Spring M.D.   On: 08/27/2016 15:46   Ct Angio Low Extrem Right W &/or Wo Contrast  Result Date: 09/09/2016 CLINICAL DATA:  Follow up for right lower extremity pain and swelling. Done two weeks ago. Patient is in extreme pain, hasn't gotten better. EXAM: CT ANGIOGRAPHY OF THE RIGHT LOWEREXTREMITY TECHNIQUE: Multidetector CT imaging of the right lowerwas performed using the standard protocol during bolus administration of intravenous contrast. Multiplanar CT  image reconstructions and MIPs were obtained to evaluate the vascular anatomy. CONTRAST:  100 mL of Isovue 370 intravenous contrast COMPARISON:  08/27/2016 FINDINGS: CTA FINDINGS Common femoral artery: There is heterogeneous partly calcified plaque with no significant stenosis. Profunda femoral artery: Heterogeneous partly calcified plaque with no hemodynamically significant stenosis. Femoral artery: Heterogeneous partly calcified plaque. No hemodynamically significant stenosis. Popliteal artery: Heterogeneous partly calcified plaque with no significant stenosis. Leg arteries: The anterior tibial artery is occluded shortly after its origin. It is reconstituted at the ankle from collaterals. Heterogeneous plaque is noted throughout the course of the posterior tibial artery. Only faint flow is seen into the distal leg. The vessel canes additional flow from collaterals at the level of the tarsal tunnel. The peroneal vein is relatively well preserved finding collateral flow to the ankle. Review of the MIP images confirms the above findings. NON CTA FINDINGS Skeletal structures: No fracture. No bone lesion. No areas of bone resorption are seen to suggest osteomyelitis. Soft tissues: The anterior thigh hematoma, centered on the vastus lateralis and medius, is significantly smaller. It measures approximately 14 x 2.7 x 5.4 cm, previously approximately 2.2 cm x 5.5 cm x 0.6 cm. It does not affect the neurovascular bundle. Subcutaneous soft tissue edema is decreased as well. In the inferior inguinal lymph node has mildly increased in size, measuring 13 mm in short axis, previously 1 cm. Superficial varicosities along the anteromedial thigh are stable. There is significant subcutaneous edema throughout the leg, most severe in the mid to proximal calf. No soft tissue air. Joints:  No joint effusion or acute finding. IMPRESSION: 1. There has been improvement since the prior CT with a significant decrease in size in the  intramuscular hematoma line within the vastus lateralis and medialis muscles. 2. Atherosclerotic disease is noted throughout the arteries of the right lower extremity. There is no hemodynamically significant stenosis from the common femoral artery through the popliteal artery. However, in the leg, the anterior tibial artery is occluded chest guidance origin. The posterior tibial artery is occluded more distally. Both of these vessels are reconstituted at the ankle from collaterals. 3. There is significant soft tissue edema of the leg. There is no soft tissue air to suggest fasciitis. 4. Mildly enlarged inferior inguinal lymph node has increased in size from the prior CT. These results were called by  telephone at the time of interpretation on 09/09/2016 at 6:27 pm to Dr. Harold Hedge , who verbally acknowledged these results. Electronically Signed   By: Amie Portland M.D.   On: 09/09/2016 18:27   Ct Angio Low Extrem Right W &/or Wo Contrast  Result Date: 08/27/2016 CLINICAL DATA:  Right lower extremity pain and swelling after recent steroid injection. EXAM: CT ANGIOGRAPHY OF THE RIGHT LOWER EXTREMITY TECHNIQUE: Multidetector CT imaging of the right/left upper/lowerwas performed using the standard protocol during bolus administration of intravenous contrast. Multiplanar CT image reconstructions and MIPs were obtained to evaluate the vascular anatomy. CONTRAST:  100 mL Isovue 370 IV COMPARISON:  None. FINDINGS: The dominant finding is a large intramuscular hematoma within the quadriceps musculature and primarily centered within the vastus intermedius and lateralis muscles. The hematoma begins in the proximal thigh just below the hip joint and continues into the distal thigh. No soft tissue gas identified. Arterial supply to the right lower extremity is intact and there is no evidence of arterial injury or arterial contrast extravasation. Scattered calcified plaque is noted in the common femoral and superficial  femoral arteries without evidence of significant obstructive disease. Calcified plaque is present in the popliteal artery without significant stenosis. Underlying bony structures are intact and without evidence of fracture or lesion. No soft tissue foreign body is identified. Review of the MIP images confirms the above findings. IMPRESSION: Large right thigh intramuscular hematoma of the quadriceps musculature primarily centered within the vastus intermedius and lateralis muscles and extending from the proximal thigh to distal thigh. No arterial injury or contrast extravasation identified. Electronically Signed   By: Irish Lack M.D.   On: 08/27/2016 17:44     Assessment/Plan 1. Hematoma of leg, right, initial encounter Continue to observe for now  CT angiogram does not show a focal fluid collection that is amenable to drainage.  I have stressed elevation and compression as mainstays of her treatment.  Given her "excruciating" pain and her lack of sleep I will give her percocet for pain control.  I have stressed with her that this is for the tratment of her hematoma related pain and the pain medication will be for a limited time.  She agrees with this plan  2. Atherosclerosis of native artery of both lower extremities with intermittent claudication (HCC)  Recommend:  The patient has evidence of atherosclerosis of the lower extremities with claudication as a baseline  CT angiogram doed not suggest clinically significant change in her arterial status.  No invasive studies, angiography or surgery at this time The patient should continue walking and begin a more formal exercise program.  The patient should continue antiplatelet therapy and aggressive treatment of the lipid abnormalities  No changes in the patient's medications at this time  The patient should continue wearing graduated compression socks 10-15 mmHg strength to control the edema and swelling from her hematoma.    3. DDD  (degenerative disc disease), lumbar Continue NSAID's as already ordered, these medications have been reviewed and there are no changes at this time.   4. 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 Chronic venous insufficiency See #1&2  6. Chronic atrial fibrillation (HCC) Hold anticoagulation for now    Levora Dredge, MD  09/14/2016 10:08 PM

## 2016-09-16 DIAGNOSIS — D500 Iron deficiency anemia secondary to blood loss (chronic): Secondary | ICD-10-CM | POA: Diagnosis not present

## 2016-09-16 DIAGNOSIS — S7011XD Contusion of right thigh, subsequent encounter: Secondary | ICD-10-CM | POA: Diagnosis not present

## 2016-09-16 DIAGNOSIS — R600 Localized edema: Secondary | ICD-10-CM | POA: Diagnosis not present

## 2016-09-16 DIAGNOSIS — M79604 Pain in right leg: Secondary | ICD-10-CM | POA: Diagnosis not present

## 2016-09-29 DIAGNOSIS — R233 Spontaneous ecchymoses: Secondary | ICD-10-CM | POA: Diagnosis not present

## 2016-09-29 DIAGNOSIS — I83891 Varicose veins of right lower extremities with other complications: Secondary | ICD-10-CM | POA: Diagnosis not present

## 2016-09-29 DIAGNOSIS — I872 Venous insufficiency (chronic) (peripheral): Secondary | ICD-10-CM | POA: Diagnosis not present

## 2016-09-30 ENCOUNTER — Other Ambulatory Visit (INDEPENDENT_AMBULATORY_CARE_PROVIDER_SITE_OTHER): Payer: PPO

## 2016-09-30 ENCOUNTER — Ambulatory Visit (INDEPENDENT_AMBULATORY_CARE_PROVIDER_SITE_OTHER): Payer: PPO | Admitting: Vascular Surgery

## 2016-09-30 ENCOUNTER — Encounter (INDEPENDENT_AMBULATORY_CARE_PROVIDER_SITE_OTHER): Payer: Self-pay | Admitting: Vascular Surgery

## 2016-09-30 VITALS — BP 140/91 | HR 73 | Resp 16 | Wt 130.0 lb

## 2016-09-30 DIAGNOSIS — I8393 Asymptomatic varicose veins of bilateral lower extremities: Secondary | ICD-10-CM | POA: Diagnosis not present

## 2016-09-30 DIAGNOSIS — I70213 Atherosclerosis of native arteries of extremities with intermittent claudication, bilateral legs: Secondary | ICD-10-CM | POA: Diagnosis not present

## 2016-09-30 DIAGNOSIS — M79604 Pain in right leg: Secondary | ICD-10-CM | POA: Diagnosis not present

## 2016-09-30 DIAGNOSIS — I482 Chronic atrial fibrillation, unspecified: Secondary | ICD-10-CM

## 2016-09-30 DIAGNOSIS — S8011XA Contusion of right lower leg, initial encounter: Secondary | ICD-10-CM | POA: Diagnosis not present

## 2016-09-30 DIAGNOSIS — I739 Peripheral vascular disease, unspecified: Secondary | ICD-10-CM

## 2016-09-30 DIAGNOSIS — I872 Venous insufficiency (chronic) (peripheral): Secondary | ICD-10-CM | POA: Diagnosis not present

## 2016-09-30 DIAGNOSIS — M79605 Pain in left leg: Secondary | ICD-10-CM | POA: Diagnosis not present

## 2016-09-30 NOTE — Progress Notes (Signed)
MRN : [id number]  Joanna Roberts is a 83 y.o. (01-04-1929) female who presents with chief complaint of  Chief Complaint  Patient presents with  . Follow-up  .  History of Present Illness: The patient returns to the office for followup and review of the noninvasive studies. There have been no interval changes in lower extremity symptoms. She states her right leg still hurts 9 out of 10 but this is much better than a few weeks ago. No interval shortening of the patient's claudication distance or development of rest pain symptoms. No new ulcers or wounds have occurred since the last visit.  There have been no significant changes to the patient's overall health care.  CT angiogram of the right leg shows resolving hematoma although a large mass is still present it is improved.  Arterial flow in the femoral popliteal system is patent there is diffuse tibial disease.  The patient denies amaurosis fugax or recent TIA symptoms. There are no recent neurological changes noted. The patient denies history of DVT, PE or superficial thrombophlebitis. The patient denies recent episodes of angina or shortness of breath.   ABI Rt=0.66 and Lt=1.19    Current Meds  Medication Sig  . budesonide (ENTOCORT EC) 3 MG 24 hr capsule Take 3 mg by mouth daily.   . carvedilol (COREG) 25 MG tablet TAKE ONE TABLET BY MOUTH TWICE DAILY WITH  MEALS  . diphenoxylate-atropine (LOMOTIL) 2.5-0.025 MG tablet Take 1-2 tablets by mouth 4 (four) times daily as needed for diarrhea or loose stools.  . docusate sodium (COLACE) 100 MG capsule Take 1 capsule (100 mg total) by mouth 2 (two) times daily as needed (take to keep stool soft.).  Marland Kitchen ferrous sulfate 325 (65 FE) MG tablet Take 1 tablet (325 mg total) by mouth daily with breakfast.  . furosemide (LASIX) 20 MG tablet Take 1 tablet (20 mg total) by mouth daily as needed for fluid.  Marland Kitchen gabapentin (NEURONTIN) 300 MG capsule Take 300 mg by mouth at bedtime.   Marland Kitchen  HYDROcodone-acetaminophen (NORCO/VICODIN) 5-325 MG tablet Limit one half to one tab by mouth per day or 2-3 times per day if tolerated  . levothyroxine (SYNTHROID, LEVOTHROID) 25 MCG tablet Take 25 mcg by mouth daily before breakfast.  . loperamide (ANTI-DIARRHEAL) 2 MG capsule Take 2 mg by mouth as needed for diarrhea or loose stools.  Marland Kitchen losartan (COZAAR) 25 MG tablet TAKE ONE TABLET EVERY DAY  . nitroGLYCERIN (NITROSTAT) 0.4 MG SL tablet Place 0.4 mg under the tongue every 5 (five) minutes as needed for chest pain.   Marland Kitchen ondansetron (ZOFRAN) 4 MG tablet Take 4 mg by mouth every 8 (eight) hours as needed.   . silver sulfADIAZINE (SILVADENE) 1 % cream Apply 1 application topically 2 (two) times daily. Apply to area twice a day  . temazepam (RESTORIL) 7.5 MG capsule Take 7.5 mg by mouth at bedtime.   . vitamin B-12 (CYANOCOBALAMIN) 1000 MCG tablet Take 1,000 mcg by mouth daily.   Current Facility-Administered Medications for the 09/30/16 encounter (Office Visit) with Gilda Crease, Latina Craver, MD  Medication  . mirabegron ER (MYRBETRIQ) tablet 25 mg    Past Medical History:  Diagnosis Date  . AICD (automatic cardioverter/defibrillator) present   . Allergy   . Anemia   . Arthritis   . Cataract   . CHF (congestive heart failure) (HCC)   . Depression   . Dysrhythmia   . GERD (gastroesophageal reflux disease)   . Heart murmur   .  Hypertension   . Myocardial infarction (HCC)   . Osteoporosis   . Presence of permanent cardiac pacemaker   . Thyroid disease     Past Surgical History:  Procedure Laterality Date  . ABDOMINAL HYSTERECTOMY    . APPENDECTOMY    . HERNIA REPAIR    . INSERT / REPLACE / REMOVE PACEMAKER    . PERIPHERAL VASCULAR CATHETERIZATION Right 10/08/2014   Procedure: Lower Extremity Angiography;  Surgeon: Renford Dills, MD;  Location: [facility];  Service: Cardiovascular;  Laterality: Right;  . PERIPHERAL VASCULAR CATHETERIZATION  10/08/2014   Procedure: Lower  Extremity Intervention;  Surgeon: Renford Dills, MD;  Location: [facility];  Service: Cardiovascular;;  . PERIPHERAL VASCULAR CATHETERIZATION N/A 03/23/2016   Procedure: Visceral Angiography;  Surgeon: Renford Dills, MD;  Location: [facility];  Service: Cardiovascular;  Laterality: N/A;    Social History Social History  Substance Use Topics  . Smoking status: Never Smoker  . Smokeless tobacco: Never Used  . Alcohol use No    Family History Family History  Problem Relation Age of Onset  . Early death Mother   . Heart disease Mother   . Arthritis Father   . Asthma Father   . Stroke Sister     Allergies  Allergen Reactions  . Lortab [Hydrocodone-Acetaminophen] Itching, Swelling and Rash  . Doxycycline Diarrhea  . Macrobid [Nitrofurantoin Monohyd Macro] Diarrhea and Nausea And Vomiting  . Nitrofurantoin Diarrhea, Nausea Only and Nausea And Vomiting     REVIEW OF SYSTEMS (Negative unless checked)  Constitutional: [] Weight loss  [] Fever  [] Chills Cardiac: [] Chest pain   [] Chest pressure   [] Palpitations   [] Shortness of breath when laying flat   [] Shortness of breath with exertion. Vascular:  [x] Pain in legs with walking   [x] Pain in legs at rest  [] History of DVT   [] Phlebitis   [x] Swelling in legs   [] Varicose veins   [] Non-healing ulcers Pulmonary:   [] Uses home oxygen   [] Productive cough   [] Hemoptysis   [] Wheeze  [] COPD   [] Asthma Neurologic:  [] Dizziness   [] Seizures   [] History of stroke   [] History of TIA  [] Aphasia   [] Vissual changes   [] Weakness or numbness in arm   [] Weakness or numbness in leg Musculoskeletal:   [] Joint swelling   [] Joint pain   [] Low back pain Hematologic:  [] Easy bruising  [] Easy bleeding   [] Hypercoagulable state   [] Anemic Gastrointestinal:  [] Diarrhea   [] Vomiting  [] Gastroesophageal reflux/heartburn   [] Difficulty swallowing. Genitourinary:  [] Chronic kidney disease   [] Difficult urination  [] Frequent urination    [] Blood in urine Skin:  [] Rashes   [] Ulcers  Psychological:  [] History of anxiety   []  History of major depression.  Physical Examination  Vitals:   09/30/16 1110  BP: (!) 140/91  Pulse: 73  Resp: 16  Weight: 130 lb (59 kg)   Body mass index is 23.03 kg/m. Gen: WD/WN, NAD Head: /AT, No temporalis wasting.  Ear/Nose/Throat: Hearing grossly intact, nares w/o erythema or drainage Eyes: PER, EOMI, sclera nonicteric.  Neck: Supple, no large masses.   Pulmonary:  Good air movement, no audible wheezing bilaterally, no use of accessory muscles.  Cardiac: RRR, no JVD Vascular: right leg diffuse ecchymosis with 3+ tense edema of the ankle and foot Vessel Right Left  Radial Palpable Palpable  PT Not Palpable Not Palpable  DP Not Palpable Not Palpable  Gastrointestinal: Non-distended. No guarding/no peritoneal signs.  Musculoskeletal: M/S 5/5 throughout  but walks with a walker.  No deformity or atrophy.  Neurologic: CN 2-12 intact. Symmetrical.  Speech is fluent. Motor exam as listed above. Psychiatric: Judgment intact, Mood & affect appropriate for pt's clinical situation. Dermatologic: No rashes or ulcers noted.  No changes consistent with cellulitis. Lymph : No lichenification or skin changes of chronic lymphedema.  CBC Lab Results  Component Value Date   WBC 12.1 (H) 08/29/2016   HGB 9.3 (L) 08/29/2016   HCT 22.0 (L) 08/29/2016   MCV 90.5 08/29/2016   PLT 120 (L) 08/29/2016    BMET    Component Value Date/Time   NA 136 08/29/2016 0240   NA 140 04/23/2014 1320   K 4.8 08/29/2016 0240   K 4.8 04/23/2014 1320   CL 106 08/29/2016 0240   CL 109 (H) 04/23/2014 1320   CO2 25 08/29/2016 0240   CO2 26 04/23/2014 1320   GLUCOSE 117 (H) 08/29/2016 0240   GLUCOSE 95 04/23/2014 1320   BUN 32 (H) 08/29/2016 0240   BUN 22 (H) 04/23/2014 1320   CREATININE 1.01 (H) 08/29/2016 0240   CREATININE 1.02 04/23/2014 1320   CALCIUM 8.2 (L) 08/29/2016 0240   CALCIUM 8.5 04/23/2014 1320     GFRNONAA 49 (L) 08/29/2016 0240   GFRNONAA 55 (L) 04/23/2014 1320   GFRNONAA 39 (L) 11/02/2013 0540   GFRAA 57 (L) 08/29/2016 0240   GFRAA >60 04/23/2014 1320   GFRAA 45 (L) 11/02/2013 0540   CrCl cannot be calculated (Patient's most recent lab result is older than the maximum 21 days allowed.).  COAG Lab Results  Component Value Date   INR 1.23 08/29/2016   INR 1.17 08/28/2016   INR 1.15 08/28/2016    Radiology Ct Angio Low Extrem Right W &/or Wo Contrast  Result Date: 09/09/2016 CLINICAL DATA:  Follow up for right lower extremity pain and swelling. Done two weeks ago. Patient is in extreme pain, hasn't gotten better. EXAM: CT ANGIOGRAPHY OF THE RIGHT LOWEREXTREMITY TECHNIQUE: Multidetector CT imaging of the right lowerwas performed using the standard protocol during bolus administration of intravenous contrast. Multiplanar CT image reconstructions and MIPs were obtained to evaluate the vascular anatomy. CONTRAST:  100 mL of Isovue 370 intravenous contrast COMPARISON:  08/27/2016 FINDINGS: CTA FINDINGS Common femoral artery: There is heterogeneous partly calcified plaque with no significant stenosis. Profunda femoral artery: Heterogeneous partly calcified plaque with no hemodynamically significant stenosis. Femoral artery: Heterogeneous partly calcified plaque. No hemodynamically significant stenosis. Popliteal artery: Heterogeneous partly calcified plaque with no significant stenosis. Leg arteries: The anterior tibial artery is occluded shortly after its origin. It is reconstituted at the ankle from collaterals. Heterogeneous plaque is noted throughout the course of the posterior tibial artery. Only faint flow is seen into the distal leg. The vessel canes additional flow from collaterals at the level of the tarsal tunnel. The peroneal vein is relatively well preserved finding collateral flow to the ankle. Review of the MIP images confirms the above findings. NON CTA FINDINGS Skeletal  structures: No fracture. No bone lesion. No areas of bone resorption are seen to suggest osteomyelitis. Soft tissues: The anterior thigh hematoma, centered on the vastus lateralis and medius, is significantly smaller. It measures approximately 14 x 2.7 x 5.4 cm, previously approximately 2.2 cm x 5.5 cm x 0.6 cm. It does not affect the neurovascular bundle. Subcutaneous soft tissue edema is decreased as well. In the inferior inguinal lymph node has mildly increased in size, measuring 13 mm in short axis, previously 1 cm.  Superficial varicosities along the anteromedial thigh are stable. There is significant subcutaneous edema throughout the leg, most severe in the mid to proximal calf. No soft tissue air. Joints:  No joint effusion or acute finding. IMPRESSION: 1. There has been improvement since the prior CT with a significant decrease in size in the intramuscular hematoma line within the vastus lateralis and medialis muscles. 2. Atherosclerotic disease is noted throughout the arteries of the right lower extremity. There is no hemodynamically significant stenosis from the common femoral artery through the popliteal artery. However, in the leg, the anterior tibial artery is occluded chest guidance origin. The posterior tibial artery is occluded more distally. Both of these vessels are reconstituted at the ankle from collaterals. 3. There is significant soft tissue edema of the leg. There is no soft tissue air to suggest fasciitis. 4. Mildly enlarged inferior inguinal lymph node has increased in size from the prior CT. These results were called by telephone at the time of interpretation on 09/09/2016 at 6:27 pm to Dr. Harold Hedge , who verbally acknowledged these results. Electronically Signed   By: Amie Portland M.D.   On: 09/09/2016 18:27    Assessment/Plan 1. Hematoma of leg, right, initial encounter  Recommend:   No surgery or intervention at this point in time.  Hematoma is resolving but remains very  symptomatic  Patient's ABI  And the CTA is consistent with distal tibial disease I discussed angiography with the patient but at this time she does not have any ulcers and therefore I will continue to follow her as if we do need to do angiography the better the hematoma is the more successful the intervention will be.  The patient is not on anticoagulation   Elevation was stressed, use of a recliner was discussed.  I have had a long discussion with the patient regarding DVT and post phlebitic changes such as swelling and why it  causes symptoms such as pain.  The patient will wear graduated compression stockings class 1 (20-30 mmHg), beginning after three full days of anticoagulation, on a daily basis a prescription was given. The patient will  beginning wearing the stockings first thing in the morning and removing them in the evening. The patient is instructed specifically not to sleep in the stockings.  In addition, behavioral modification including elevation during the day and avoidance of prolonged dependency will be initiated.    The patient will continue anticoagulation for now as there have not been any problems or complications at this point.    A total of 30 minutes was spent with this patient and greater than 50% was spent in counseling and coordination of care with the patient.  Discussion included the treatment options for vascular disease including indications for surgery and intervention.  Also discussed is the appropriate timing of treatment.  In addition medical therapy was discussed.   2. Pain in both lower extremities See #1  3. PAD See #1  4. Varicose veins of both lower extremities No surgery or intervention at this point in time.    I have had a long discussion with the patient regarding venous insufficiency and why it  causes symptoms. I have discussed with the patient the chronic skin changes that accompany venous insufficiency and the long term sequela such as  infection and ulceration.  Patient will begin wearing graduated compression stockings class 1 (20-30 mmHg) or compression wraps on a daily basis a prescription was given. The patient will put the stockings on first thing  in the morning and removing them in the evening. The patient is instructed specifically not to sleep in the stockings.    In addition, behavioral modification including several periods of elevation of the lower extremities during the day will be continued. I have demonstrated that proper elevation is a position with the ankles at heart level.  The patient is instructed to begin routine exercise, especially walking on a daily basis  Following the review of the ultrasound the patient will follow up in 2-3 months to reassess the degree of swelling and the control that graduated compression stockings or compression wraps  is offering.   The patient can be assessed for a Lymph Pump at that time  5. Chronic venous insufficiency See #4  6.  Chronic atrial fibrillation (HCC)  Hold Coumadin for now  continue antiarrhythmics    Levora Dredge, MD  09/30/2016 11:44 AM

## 2016-10-05 DIAGNOSIS — I480 Paroxysmal atrial fibrillation: Secondary | ICD-10-CM | POA: Diagnosis not present

## 2016-10-07 DIAGNOSIS — I890 Lymphedema, not elsewhere classified: Secondary | ICD-10-CM | POA: Diagnosis not present

## 2016-10-21 ENCOUNTER — Ambulatory Visit (INDEPENDENT_AMBULATORY_CARE_PROVIDER_SITE_OTHER): Payer: PPO | Admitting: Vascular Surgery

## 2016-10-21 ENCOUNTER — Encounter (INDEPENDENT_AMBULATORY_CARE_PROVIDER_SITE_OTHER): Payer: Self-pay | Admitting: Vascular Surgery

## 2016-10-21 VITALS — BP 116/81 | HR 76 | Resp 16 | Wt 129.0 lb

## 2016-10-21 DIAGNOSIS — I872 Venous insufficiency (chronic) (peripheral): Secondary | ICD-10-CM | POA: Diagnosis not present

## 2016-10-21 DIAGNOSIS — S8011XA Contusion of right lower leg, initial encounter: Secondary | ICD-10-CM | POA: Diagnosis not present

## 2016-10-21 DIAGNOSIS — I890 Lymphedema, not elsewhere classified: Secondary | ICD-10-CM

## 2016-10-21 DIAGNOSIS — I482 Chronic atrial fibrillation, unspecified: Secondary | ICD-10-CM

## 2016-10-24 NOTE — Progress Notes (Signed)
MRN : [id number]  Joanna Roberts is a 83 y.o. (October 01, 1928) female who presents with chief complaint of  Chief Complaint  Patient presents with  . Follow-up  .  History of Present Illness: The patient returns to the office for followup and review of the noninvasive studies. There have been no interval changes in lower extremity symptoms. She states her right leg still hurts and keeps her up at night but also states it is much better than a few weeks ago. No interval shortening of the patient's claudication distance or development of rest pain symptoms. No new ulcers or wounds have occurred since the last visit.  She is now saying that there are new ulcers of the skin over the ankle bones bilaterally left more so than the right also causing pain.  She is unclear as to whether she is using compression on a daily basis  Current Meds  Medication Sig  . budesonide (ENTOCORT EC) 3 MG 24 hr capsule Take 3 mg by mouth daily.   . carvedilol (COREG) 25 MG tablet TAKE ONE TABLET BY MOUTH TWICE DAILY WITH  MEALS  . diphenoxylate-atropine (LOMOTIL) 2.5-0.025 MG tablet Take 1-2 tablets by mouth 4 (four) times daily as needed for diarrhea or loose stools.  . docusate sodium (COLACE) 100 MG capsule Take 1 capsule (100 mg total) by mouth 2 (two) times daily as needed (take to keep stool soft.).  Marland Kitchen ferrous sulfate 325 (65 FE) MG tablet Take 1 tablet (325 mg total) by mouth daily with breakfast.  . furosemide (LASIX) 20 MG tablet Take 1 tablet (20 mg total) by mouth daily as needed for fluid.  Marland Kitchen gabapentin (NEURONTIN) 300 MG capsule Take 300 mg by mouth at bedtime.   Marland Kitchen HYDROcodone-acetaminophen (NORCO/VICODIN) 5-325 MG tablet Limit one half to one tab by mouth per day or 2-3 times per day if tolerated  . levothyroxine (SYNTHROID, LEVOTHROID) 25 MCG tablet Take 25 mcg by mouth daily before breakfast.  . loperamide (ANTI-DIARRHEAL) 2 MG capsule Take 2 mg by mouth as needed for diarrhea or loose stools.  Marland Kitchen  losartan (COZAAR) 25 MG tablet TAKE ONE TABLET EVERY DAY  . nitroGLYCERIN (NITROSTAT) 0.4 MG SL tablet Place 0.4 mg under the tongue every 5 (five) minutes as needed for chest pain.   Marland Kitchen ondansetron (ZOFRAN) 4 MG tablet Take 4 mg by mouth every 8 (eight) hours as needed.   . silver sulfADIAZINE (SILVADENE) 1 % cream Apply 1 application topically 2 (two) times daily. Apply to area twice a day  . temazepam (RESTORIL) 7.5 MG capsule Take 7.5 mg by mouth at bedtime.   . vitamin B-12 (CYANOCOBALAMIN) 1000 MCG tablet Take 1,000 mcg by mouth daily.   Current Facility-Administered Medications for the 10/21/16 encounter (Office Visit) with Gilda Crease, Latina Craver, MD  Medication  . mirabegron ER (MYRBETRIQ) tablet 25 mg    Past Medical History:  Diagnosis Date  . AICD (automatic cardioverter/defibrillator) present   . Allergy   . Anemia   . Arthritis   . Cataract   . CHF (congestive heart failure) (HCC)   . Depression   . Dysrhythmia   . GERD (gastroesophageal reflux disease)   . Heart murmur   . Hypertension   . Myocardial infarction (HCC)   . Osteoporosis   . Presence of permanent cardiac pacemaker   . Thyroid disease     Past Surgical History:  Procedure Laterality Date  . ABDOMINAL HYSTERECTOMY    . APPENDECTOMY    .  HERNIA REPAIR    . INSERT / REPLACE / REMOVE PACEMAKER    . PERIPHERAL VASCULAR CATHETERIZATION Right 10/08/2014   Procedure: Lower Extremity Angiography;  Surgeon: Renford Dills, MD;  Location: [facility];  Service: Cardiovascular;  Laterality: Right;  . PERIPHERAL VASCULAR CATHETERIZATION  10/08/2014   Procedure: Lower Extremity Intervention;  Surgeon: Renford Dills, MD;  Location: [facility];  Service: Cardiovascular;;  . PERIPHERAL VASCULAR CATHETERIZATION N/A 03/23/2016   Procedure: Visceral Angiography;  Surgeon: Renford Dills, MD;  Location: [facility];  Service: Cardiovascular;  Laterality: N/A;    Social History Social  History  Substance Use Topics  . Smoking status: Never Smoker  . Smokeless tobacco: Never Used  . Alcohol use No    Family History Family History  Problem Relation Age of Onset  . Early death Mother   . Heart disease Mother   . Arthritis Father   . Asthma Father   . Stroke Sister     Allergies  Allergen Reactions  . Lortab [Hydrocodone-Acetaminophen] Itching, Swelling and Rash  . Doxycycline Diarrhea  . Macrobid [Nitrofurantoin Monohyd Macro] Diarrhea and Nausea And Vomiting  . Nitrofurantoin Diarrhea, Nausea Only and Nausea And Vomiting     REVIEW OF SYSTEMS (Negative unless checked)  Constitutional: [] Weight loss  [] Fever  [] Chills Cardiac: [] Chest pain   [] Chest pressure   [] Palpitations   [] Shortness of breath when laying flat   [] Shortness of breath with exertion. Vascular:  [x] Pain in legs with walking   [x] Pain in legs at rest  [] History of DVT   [] Phlebitis   [x] Swelling in legs   [] Varicose veins   [] Non-healing ulcers Pulmonary:   [] Uses home oxygen   [] Productive cough   [] Hemoptysis   [] Wheeze  [] COPD   [] Asthma Neurologic:  [] Dizziness   [] Seizures   [] History of stroke   [] History of TIA  [] Aphasia   [] Vissual changes   [] Weakness or numbness in arm   [] Weakness or numbness in leg Musculoskeletal:   [x] Joint swelling   [x] Joint pain   [] Low back pain Hematologic:  [] Easy bruising  [] Easy bleeding   [] Hypercoagulable state   [] Anemic Gastrointestinal:  [] Diarrhea   [] Vomiting  [] Gastroesophageal reflux/heartburn   [] Difficulty swallowing. Genitourinary:  [] Chronic kidney disease   [] Difficult urination  [] Frequent urination   [] Blood in urine Skin:  [] Rashes   [] Ulcers  Psychological:  [] History of anxiety   []  History of major depression.  Physical Examination  Vitals:   10/21/16 1141  BP: 116/81  Pulse: 76  Resp: 16  Weight: 129 lb (58.5 kg)   Body mass index is 22.85 kg/m. Gen: WD/WN, NAD Head: [employer]/AT, No temporalis wasting.  Ear/Nose/Throat:  Hearing grossly intact, nares w/o erythema or drainage Eyes: PER, EOMI, sclera nonicteric.  Neck: Supple, no large masses.   Pulmonary:  Good air movement, no audible wheezing bilaterally, no use of accessory muscles.  Cardiac: RRR, no JVD Vascular: right leg remains edematous and discolored secondary to the hematoma but it is much improved and 80% back to normal.  The areas over the malleolus do not appear to have ulceration there is some callous formation but this is her baseline Vessel Right Left  Radial Palpable Palpable  Ulnar Palpable Palpable  Brachial Palpable Palpable  Carotid Palpable Palpable  Femoral Palpable Palpable  Popliteal Not Palpable Not Palpable  PT Not Palpable Not Palpable  DP Not Palpable Not Palpable  Gastrointestinal: Non-distended. No guarding/no peritoneal signs.  Musculoskeletal: M/S  5/5 throughout arms legs 4/5 motor.  some deformity and  Atrophy of the lower extremities; ambulates with a walker.  Neurologic: CN 2-12 intact. Symmetrical.  Speech is fluent. Motor exam as listed above. Psychiatric: Judgment intact, Mood & affect appropriate for pt's clinical situation. Dermatologic: No rashes or ulcers noted.  No changes consistent with cellulitis. Lymph : No lichenification or skin changes of chronic lymphedema.  CBC Lab Results  Component Value Date   WBC 12.1 (H) 08/29/2016   HGB 9.3 (L) 08/29/2016   HCT 22.0 (L) 08/29/2016   MCV 90.5 08/29/2016   PLT 120 (L) 08/29/2016    BMET    Component Value Date/Time   NA 136 08/29/2016 0240   NA 140 04/23/2014 1320   K 4.8 08/29/2016 0240   K 4.8 04/23/2014 1320   CL 106 08/29/2016 0240   CL 109 (H) 04/23/2014 1320   CO2 25 08/29/2016 0240   CO2 26 04/23/2014 1320   GLUCOSE 117 (H) 08/29/2016 0240   GLUCOSE 95 04/23/2014 1320   BUN 32 (H) 08/29/2016 0240   BUN 22 (H) 04/23/2014 1320   CREATININE 1.01 (H) 08/29/2016 0240   CREATININE 1.02 04/23/2014 1320   CALCIUM 8.2 (L) 08/29/2016 0240   CALCIUM  8.5 04/23/2014 1320   GFRNONAA 49 (L) 08/29/2016 0240   GFRNONAA 55 (L) 04/23/2014 1320   GFRNONAA 39 (L) 11/02/2013 0540   GFRAA 57 (L) 08/29/2016 0240   GFRAA >60 04/23/2014 1320   GFRAA 45 (L) 11/02/2013 0540   CrCl cannot be calculated (Patient's most recent lab result is older than the maximum 21 days allowed.).  COAG Lab Results  Component Value Date   INR 1.23 08/29/2016   INR 1.17 08/28/2016   INR 1.15 08/28/2016    Radiology No results found.  Assessment/Plan 1. Hematoma of leg, right, initial encounter No surgery or intervention at this point in time.  Hematoma is resolving but remains very symptomatic  Patient's ABI  And the CTA is consistent with distal tibial disease I again discussed angiography with the patient but at this time she does not have any ulcers and therefore I will continue to follow her.  This is a very difficult situation because her interview changes in many respect through out the visit.  Sometimes she is attesting to rest pain sometime not, sometime she is in dire need of pain medication sometime she is not.  In the past she has always stated that angiograms with intervention have not improved her pain at all and so I feel that intervention is indicated only in the event of a true ulcer developing.  I have asked her to see pain management but this angered her she states this is no help at all.  I will not give her narcotics today and have asked her to discuss her chronic pain medications with her primary.  2. Chronic venous insufficiency No surgery or intervention at this point in time.    I have had a long discussion with the patient regarding venous insufficiency and why it  causes symptoms. I have discussed with the patient the chronic skin changes that accompany venous insufficiency and the long term sequela such as infection and ulceration.  Patient will begin wearing graduated compression stockings class 1 (20-30 mmHg) or compression wraps on a  daily basis a prescription was given. The patient will put the stockings on first thing in the morning and removing them in the evening. The patient is instructed specifically not to sleep  in the stockings.    In addition, behavioral modification including several periods of elevation of the lower extremities during the day will be continued. I have demonstrated that proper elevation is a position with the ankles at heart level.  Following the review of the ultrasound the patient will follow up in 2-3 months to reassess the degree of swelling and the control that graduated compression stockings or compression wraps  is offering.    3. Chronic atrial fibrillation (HCC) Continue antiarrhythmia medications as already ordered, these medications have been reviewed and there are no changes at this time.  Continue anticoagulation as ordered by Cardiology Service   4. Lymphedema See #2    Levora Dredge, MD  10/24/2016 3:47 PM

## 2016-10-26 DIAGNOSIS — R109 Unspecified abdominal pain: Secondary | ICD-10-CM | POA: Diagnosis not present

## 2016-10-26 DIAGNOSIS — G8929 Other chronic pain: Secondary | ICD-10-CM | POA: Diagnosis not present

## 2016-10-26 DIAGNOSIS — R350 Frequency of micturition: Secondary | ICD-10-CM | POA: Diagnosis not present

## 2016-11-01 DIAGNOSIS — S22000A Wedge compression fracture of unspecified thoracic vertebra, initial encounter for closed fracture: Secondary | ICD-10-CM | POA: Diagnosis not present

## 2016-11-01 DIAGNOSIS — R0781 Pleurodynia: Secondary | ICD-10-CM | POA: Diagnosis not present

## 2016-11-04 DIAGNOSIS — G8929 Other chronic pain: Secondary | ICD-10-CM | POA: Diagnosis not present

## 2016-11-04 DIAGNOSIS — S22000S Wedge compression fracture of unspecified thoracic vertebra, sequela: Secondary | ICD-10-CM | POA: Diagnosis not present

## 2016-11-04 DIAGNOSIS — M549 Dorsalgia, unspecified: Secondary | ICD-10-CM | POA: Diagnosis not present

## 2016-11-06 DIAGNOSIS — I890 Lymphedema, not elsewhere classified: Secondary | ICD-10-CM | POA: Diagnosis not present

## 2016-11-07 ENCOUNTER — Emergency Department
Admission: EM | Admit: 2016-11-07 | Discharge: 2016-11-07 | Disposition: A | Discharge status: Home / Self Care | Payer: PPO | Attending: Emergency Medicine | Admitting: Emergency Medicine

## 2016-11-07 ENCOUNTER — Encounter: Payer: Self-pay | Admitting: Emergency Medicine

## 2016-11-07 DIAGNOSIS — I509 Heart failure, unspecified: Secondary | ICD-10-CM | POA: Diagnosis not present

## 2016-11-07 DIAGNOSIS — Z960 Presence of urogenital implants: Secondary | ICD-10-CM | POA: Diagnosis not present

## 2016-11-07 DIAGNOSIS — Z79899 Other long term (current) drug therapy: Secondary | ICD-10-CM | POA: Diagnosis not present

## 2016-11-07 DIAGNOSIS — N938 Other specified abnormal uterine and vaginal bleeding: Secondary | ICD-10-CM | POA: Diagnosis present

## 2016-11-07 DIAGNOSIS — R102 Pelvic and perineal pain: Secondary | ICD-10-CM | POA: Diagnosis not present

## 2016-11-07 DIAGNOSIS — I110 Hypertensive heart disease with heart failure: Secondary | ICD-10-CM | POA: Diagnosis not present

## 2016-11-07 NOTE — Discharge Instructions (Signed)
Continue to monitor current symptoms if you notice symptoms worsen or do not hesitate to return to the emergency department.

## 2016-11-07 NOTE — ED Triage Notes (Signed)
Pt c/o pessary rotating this morning and has had some pain from it.  NAD. VSS.  Spoke with dr Fanny Bien no protocols at this time.

## 2016-11-07 NOTE — ED Triage Notes (Signed)
FIRST NURSE NOTE-pt reports has pessary to hold bladder up and it has rotated and is causing discomfort

## 2016-11-07 NOTE — ED Provider Notes (Signed)
[facility] Emergency Department Provider Note   ____________________________________________   I have reviewed the triage vital signs and the nursing notes.   HISTORY  Chief Complaint pessary rotated    HPI Joanna Roberts is a 83 y.o. female presents to the emergency department with pain as result of possibly her pessary has become rotated. Patient noted onset of discomfort yesterday with light vaginal bleeding. Patient felt she needed to be evaluated and was unsure if she should wait until tomorrow to have current symptoms address. Patient denies fever, chills, headache, vision changes, chest pain, chest tightness, shortness of breath, abdominal pain, nausea and vomiting.  Past Medical History:  Diagnosis Date  . AICD (automatic cardioverter/defibrillator) present   . Allergy   . Anemia   . Arthritis   . Cataract   . CHF (congestive heart failure) (HCC)   . Depression   . Dysrhythmia   . GERD (gastroesophageal reflux disease)   . Heart murmur   . Hypertension   . Myocardial infarction (HCC)   . Osteoporosis   . Presence of permanent cardiac pacemaker   . Thyroid disease     Patient Active Problem List   Diagnosis Date Noted  . Varicose veins of both lower extremities 09/30/2016  . Lymphedema 07/29/2016  . Protein-calorie malnutrition, severe 06/03/2016  . Sepsis (HCC) 06/01/2016  . Acute blood loss anemia 04/01/2016  . Hematoma of leg, right, initial encounter 03/23/2016  . Chronic mesenteric ischemia (HCC) 03/15/2016  . Pain in limb 02/12/2016  . Chronic venous insufficiency 02/12/2016  . DDD (degenerative disc disease), lumbar 11/18/2015  . Facet syndrome, lumbar (HCC) 11/18/2015  . Sacroiliac joint dysfunction 11/18/2015  . Spinal stenosis, lumbar region, with neurogenic claudication 11/18/2015  . Lumbar radiculopathy 11/18/2015  . Compression fracture of thoracic spine, non-traumatic (HCC) 11/18/2015  . Idiopathic scoliosis  11/18/2015  . H/O varicose vein stripping 11/18/2015  . Acquired hypothyroidism 09/17/2015  . Atrial fibrillation (HCC) 09/17/2015  . Cardiomyopathy (HCC) 09/17/2015  . Back pain, chronic 09/17/2015  . DD (diverticular disease) 09/17/2015  . Aggrieved 09/17/2015  . 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 History of colon polyps 09/17/2015  . Personal history of urinary infection 09/17/2015  . Cannot sleep 09/17/2015  . Esophagitis, reflux 09/17/2015  . Tachycardia-bradycardia (HCC) 09/17/2015  . A-fib (HCC) 03/18/2015  . Pneumonia 03/11/2015  . B12 deficiency 11/27/2014  . Chronic anemia 11/26/2014  . Incomplete bladder emptying 06/24/2014  . Frequent UTI 06/24/2014  . History of urinary anomaly 06/21/2014  . FOM (frequency of micturition) 06/21/2014  . Chronic abdominal pain 01/17/2014  . CN (constipation) 01/17/2014  . Fatty infiltration of liver 01/17/2014  . Pain in rib 11/06/2013  . Temporary cerebral vascular dysfunction 11/06/2013    Past Surgical History:  Procedure Laterality Date  . ABDOMINAL HYSTERECTOMY    . APPENDECTOMY    . HERNIA REPAIR    . INSERT / REPLACE / REMOVE PACEMAKER    . PERIPHERAL VASCULAR CATHETERIZATION Right 10/08/2014   Procedure: Lower Extremity Angiography;  Surgeon: Renford Dills, MD;  Location: [facility] INVASIVE [facility];  Service: Cardiovascular;  Laterality: Right;  . PERIPHERAL VASCULAR CATHETERIZATION  10/08/2014   Procedure: Lower Extremity Intervention;  Surgeon: Renford Dills, MD;  Location: [facility];  Service: Cardiovascular;;  . PERIPHERAL VASCULAR CATHETERIZATION N/A 03/23/2016   Procedure: Visceral Angiography;  Surgeon: Renford Dills, MD;  Location: [facility];  Service: Cardiovascular;  Laterality: N/A;    Prior to Admission  medications   Medication Sig Start Date End Date Taking? Authorizing Provider  budesonide (ENTOCORT EC) 3 MG 24 hr capsule Take 3 mg by mouth daily.  04/14/16   [provider]  carvedilol (COREG) 25 MG tablet TAKE ONE TABLET BY MOUTH TWICE DAILY WITH  MEALS 06/26/15   [provider]  diphenoxylate-atropine (LOMOTIL) 2.5-0.025 MG tablet Take 1-2 tablets by mouth 4 (four) times daily as needed for diarrhea or loose stools. 04/08/16   Schnier, Latina Craver, MD  docusate sodium (COLACE) 100 MG capsule Take 1 capsule (100 mg total) by mouth 2 (two) times daily as needed (take to keep stool soft.). 09/17/15   Vanna Scotland, MD  ferrous sulfate 325 (65 FE) MG tablet Take 1 tablet (325 mg total) by mouth daily with breakfast. 08/29/16   Adrian Saran, MD  furosemide (LASIX) 20 MG tablet Take 1 tablet (20 mg total) by mouth daily as needed for fluid. 08/29/16   Adrian Saran, MD  gabapentin (NEURONTIN) 300 MG capsule Take 300 mg by mouth at bedtime.  03/16/16   [provider]  HYDROcodone-acetaminophen (NORCO/VICODIN) 5-325 MG tablet Limit one half to one tab by mouth per day or 2-3 times per day if tolerated 12/09/15   Ewing Schlein, MD  levothyroxine (SYNTHROID, LEVOTHROID) 25 MCG tablet Take 25 mcg by mouth daily before breakfast.    [provider]  loperamide (ANTI-DIARRHEAL) 2 MG capsule Take 2 mg by mouth as needed for diarrhea or loose stools.    [provider]  losartan (COZAAR) 25 MG tablet TAKE ONE TABLET EVERY DAY 07/30/15   [provider]  nitroGLYCERIN (NITROSTAT) 0.4 MG SL tablet Place 0.4 mg under the tongue every 5 (five) minutes as needed for chest pain.     [provider]  ondansetron (ZOFRAN) 4 MG tablet Take 4 mg by mouth every 8 (eight) hours as needed.  03/30/16   [provider]  silver sulfADIAZINE (SILVADENE) 1 % cream Apply 1 application topically 2 (two) times daily. Apply to area twice a day 08/24/16   Stegmayer, Cala Bradford A, PA-C  temazepam (RESTORIL) 7.5 MG capsule Take 7.5 mg by mouth at bedtime.  03/17/16   [provider]  vitamin B-12 (CYANOCOBALAMIN) 1000 MCG tablet Take 1,000 mcg by  mouth daily.    [provider]    Allergies Lortab [hydrocodone-acetaminophen]; Doxycycline; Macrobid [nitrofurantoin monohyd macro]; and Nitrofurantoin  Family History  Problem Relation Age of Onset  . Early death Mother   . Heart disease Mother   . Arthritis Father   . Asthma Father   . Stroke Sister     Social History Social History  Substance Use Topics  . Smoking status: Never Smoker  . Smokeless tobacco: Never Used  . Alcohol use No    Review of Systems Constitutional: Negative for fever/chills Eyes: No visual changes. Cardiovascular: Denies chest pain. Respiratory: Denies shortness of breath. Gastrointestinal: No abdominal pain.  No nausea, vomiting, diarrhea. Genitourinary: Negative for dysuria. Vaginal pain related to pessary malposition. Skin: Negative for rash. Neurological: Negative for headaches.  ____________________________________________   PHYSICAL EXAM:  VITAL SIGNS: ED Triage Vitals [11/07/16 1241]  Enc Vitals Group     BP 131/78     Pulse Rate 70     Resp 18     Temp 98 F (36.7 C)     Temp Source Oral     SpO2 98 %     Weight 129 lb (58.5 kg)  Height 5\' 4"  (1.626 m)     Head Circumference      Peak Flow      Pain Score 10     Pain Loc      Pain Edu?      Excl. in GC?     Constitutional: Alert and oriented. Well appearing and in no acute distress.  Head: Normocephalic and atraumatic. Eyes: Conjunctivae are normal. PERRL Cardiovascular: Normal rate, regular rhythm. Normal distal pulses. Respiratory: Normal respiratory effort.  Gastrointestinal: Soft and nontender. Genitourinary: Negative for dysuria. Vaginal pain related to pessary malposition. Musculoskeletal: Nontender with normal range of motion in all extremities. Neurologic: Normal speech and language.  Skin:  Skin is warm, dry and intact. No rash noted. ____________________________________________   LABS (all labs ordered are listed, but only abnormal results  are displayed)  Labs Reviewed - No data to display ____________________________________________  EKG None ____________________________________________  RADIOLOGY None ____________________________________________   PROCEDURES  Procedure(s) performed: no    Critical Care performed: no ____________________________________________   INITIAL IMPRESSION / ASSESSMENT AND PLAN / ED COURSE  Pertinent labs & imaging results that were available during my care of the patient were reviewed by me and considered in my medical decision making (see chart for details).  Patient presented with vaginal and lower pelvic pain secondary to her pessary becoming rotated. History and physical exam are reassuring positioning was stable not requiring any intervention at this time. Consulted with Dr. Feliberto Gottron with [facility] he advised that she could follow-up with her office tomorrow seeing her regular physician Dr. Christeen Douglas. Discussed this with patient and her daughter and they were comfortable with follow-through with this plan. Vital signs were reassuring.      ____________________________________________   FINAL CLINICAL IMPRESSION(S) / ED DIAGNOSES  Final diagnoses:  Presence of pessary  Vaginal pain       NEW MEDICATIONS STARTED DURING THIS VISIT:  Discharge Medication List as of 11/07/2016  2:13 PM       Note:  This document was prepared using Dragon voice recognition software and may include unintentional dictation errors.    Percell Boston 11/07/16 1721    Schaevitz, Myra Rude, MD 11/11/16 [phone]

## 2016-11-08 DIAGNOSIS — T8389XA Other specified complication of genitourinary prosthetic devices, implants and grafts, initial encounter: Secondary | ICD-10-CM | POA: Diagnosis not present

## 2016-11-08 DIAGNOSIS — N898 Other specified noninflammatory disorders of vagina: Secondary | ICD-10-CM | POA: Diagnosis not present

## 2016-11-08 DIAGNOSIS — R300 Dysuria: Secondary | ICD-10-CM | POA: Diagnosis not present

## 2016-11-11 DIAGNOSIS — N819 Female genital prolapse, unspecified: Secondary | ICD-10-CM | POA: Diagnosis not present

## 2016-11-15 DIAGNOSIS — G8929 Other chronic pain: Secondary | ICD-10-CM | POA: Diagnosis not present

## 2016-11-15 DIAGNOSIS — N3941 Urge incontinence: Secondary | ICD-10-CM | POA: Diagnosis not present

## 2016-11-15 DIAGNOSIS — N390 Urinary tract infection, site not specified: Secondary | ICD-10-CM | POA: Diagnosis not present

## 2016-11-15 DIAGNOSIS — R109 Unspecified abdominal pain: Secondary | ICD-10-CM | POA: Diagnosis not present

## 2016-11-15 DIAGNOSIS — F3341 Major depressive disorder, recurrent, in partial remission: Secondary | ICD-10-CM | POA: Diagnosis not present

## 2016-11-15 DIAGNOSIS — M545 Low back pain: Secondary | ICD-10-CM | POA: Diagnosis not present

## 2016-11-15 DIAGNOSIS — R0602 Shortness of breath: Secondary | ICD-10-CM | POA: Diagnosis not present

## 2016-11-15 DIAGNOSIS — R5383 Other fatigue: Secondary | ICD-10-CM | POA: Diagnosis not present

## 2016-11-15 DIAGNOSIS — R071 Chest pain on breathing: Secondary | ICD-10-CM | POA: Diagnosis not present

## 2016-11-16 ENCOUNTER — Ambulatory Visit: Payer: PPO

## 2016-11-16 ENCOUNTER — Other Ambulatory Visit: Payer: Self-pay | Admitting: Physician Assistant

## 2016-11-16 DIAGNOSIS — R071 Chest pain on breathing: Secondary | ICD-10-CM

## 2016-11-16 DIAGNOSIS — R109 Unspecified abdominal pain: Secondary | ICD-10-CM

## 2016-11-16 DIAGNOSIS — R0602 Shortness of breath: Secondary | ICD-10-CM

## 2016-11-17 ENCOUNTER — Other Ambulatory Visit: Payer: Self-pay | Admitting: Physician Assistant

## 2016-11-17 ENCOUNTER — Ambulatory Visit
Admission: RE | Admit: 2016-11-17 | Discharge: 2016-11-17 | Disposition: A | Discharge status: Home / Self Care | Payer: PPO | Source: Ambulatory Visit | Attending: Physician Assistant | Admitting: Physician Assistant

## 2016-11-17 DIAGNOSIS — R109 Unspecified abdominal pain: Secondary | ICD-10-CM | POA: Insufficient documentation

## 2016-11-17 DIAGNOSIS — R0602 Shortness of breath: Secondary | ICD-10-CM | POA: Diagnosis not present

## 2016-11-17 DIAGNOSIS — I2510 Atherosclerotic heart disease of native coronary artery without angina pectoris: Secondary | ICD-10-CM | POA: Diagnosis not present

## 2016-11-17 DIAGNOSIS — R071 Chest pain on breathing: Secondary | ICD-10-CM

## 2016-11-17 DIAGNOSIS — I517 Cardiomegaly: Secondary | ICD-10-CM | POA: Insufficient documentation

## 2016-11-17 DIAGNOSIS — R079 Chest pain, unspecified: Secondary | ICD-10-CM | POA: Diagnosis not present

## 2016-11-17 DIAGNOSIS — R918 Other nonspecific abnormal finding of lung field: Secondary | ICD-10-CM | POA: Insufficient documentation

## 2016-11-17 DIAGNOSIS — I700 Atherosclerosis of aorta: Secondary | ICD-10-CM | POA: Diagnosis not present

## 2016-11-17 MED ORDER — IOPAMIDOL (ISOVUE-370) INJECTION 76%
75.0000 mL | Freq: Once | INTRAVENOUS | Status: AC | PRN
Start: 2016-11-17 — End: 2016-11-17
  Administered 2016-11-17: 75 mL via INTRAVENOUS

## 2016-11-23 DIAGNOSIS — S22080A Wedge compression fracture of T11-T12 vertebra, initial encounter for closed fracture: Secondary | ICD-10-CM | POA: Diagnosis not present

## 2016-11-23 DIAGNOSIS — R109 Unspecified abdominal pain: Secondary | ICD-10-CM | POA: Diagnosis not present

## 2016-11-23 DIAGNOSIS — G8929 Other chronic pain: Secondary | ICD-10-CM | POA: Diagnosis not present

## 2016-12-01 ENCOUNTER — Ambulatory Visit: Payer: PPO | Admitting: Pain Medicine

## 2016-12-02 ENCOUNTER — Ambulatory Visit: Payer: PPO | Admitting: Student in an Organized Health Care Education/Training Program

## 2016-12-08 DIAGNOSIS — R1084 Generalized abdominal pain: Secondary | ICD-10-CM | POA: Diagnosis not present

## 2016-12-08 DIAGNOSIS — G8929 Other chronic pain: Secondary | ICD-10-CM | POA: Diagnosis not present

## 2016-12-08 DIAGNOSIS — R238 Other skin changes: Secondary | ICD-10-CM | POA: Diagnosis not present

## 2016-12-08 DIAGNOSIS — N390 Urinary tract infection, site not specified: Secondary | ICD-10-CM | POA: Diagnosis not present

## 2016-12-08 DIAGNOSIS — A499 Bacterial infection, unspecified: Secondary | ICD-10-CM | POA: Diagnosis not present

## 2016-12-08 DIAGNOSIS — M546 Pain in thoracic spine: Secondary | ICD-10-CM | POA: Diagnosis not present

## 2016-12-09 ENCOUNTER — Ambulatory Visit: Payer: PPO | Attending: Pain Medicine | Admitting: Student in an Organized Health Care Education/Training Program

## 2016-12-09 ENCOUNTER — Encounter: Payer: Self-pay | Admitting: Student in an Organized Health Care Education/Training Program

## 2016-12-09 VITALS — BP 105/75 | HR 73 | Temp 98.2°F | Resp 18 | Ht 64.0 in | Wt 127.0 lb

## 2016-12-09 DIAGNOSIS — H269 Unspecified cataract: Secondary | ICD-10-CM | POA: Insufficient documentation

## 2016-12-09 DIAGNOSIS — I482 Chronic atrial fibrillation, unspecified: Secondary | ICD-10-CM

## 2016-12-09 DIAGNOSIS — Z8249 Family history of ischemic heart disease and other diseases of the circulatory system: Secondary | ICD-10-CM | POA: Diagnosis not present

## 2016-12-09 DIAGNOSIS — Z823 Family history of stroke: Secondary | ICD-10-CM | POA: Insufficient documentation

## 2016-12-09 DIAGNOSIS — I252 Old myocardial infarction: Secondary | ICD-10-CM | POA: Insufficient documentation

## 2016-12-09 DIAGNOSIS — M810 Age-related osteoporosis without current pathological fracture: Secondary | ICD-10-CM | POA: Diagnosis not present

## 2016-12-09 DIAGNOSIS — Z87440 Personal history of urinary (tract) infections: Secondary | ICD-10-CM | POA: Diagnosis not present

## 2016-12-09 DIAGNOSIS — Z95810 Presence of automatic (implantable) cardiac defibrillator: Secondary | ICD-10-CM | POA: Diagnosis not present

## 2016-12-09 DIAGNOSIS — M5136 Other intervertebral disc degeneration, lumbar region: Secondary | ICD-10-CM | POA: Diagnosis not present

## 2016-12-09 DIAGNOSIS — Z79899 Other long term (current) drug therapy: Secondary | ICD-10-CM | POA: Insufficient documentation

## 2016-12-09 DIAGNOSIS — R339 Retention of urine, unspecified: Secondary | ICD-10-CM | POA: Insufficient documentation

## 2016-12-09 DIAGNOSIS — M5116 Intervertebral disc disorders with radiculopathy, lumbar region: Secondary | ICD-10-CM | POA: Insufficient documentation

## 2016-12-09 DIAGNOSIS — M4125 Other idiopathic scoliosis, thoracolumbar region: Secondary | ICD-10-CM

## 2016-12-09 DIAGNOSIS — D649 Anemia, unspecified: Secondary | ICD-10-CM | POA: Insufficient documentation

## 2016-12-09 DIAGNOSIS — E43 Unspecified severe protein-calorie malnutrition: Secondary | ICD-10-CM | POA: Insufficient documentation

## 2016-12-09 DIAGNOSIS — E538 Deficiency of other specified B group vitamins: Secondary | ICD-10-CM | POA: Diagnosis not present

## 2016-12-09 DIAGNOSIS — M48062 Spinal stenosis, lumbar region with neurogenic claudication: Secondary | ICD-10-CM | POA: Insufficient documentation

## 2016-12-09 DIAGNOSIS — K760 Fatty (change of) liver, not elsewhere classified: Secondary | ICD-10-CM | POA: Insufficient documentation

## 2016-12-09 DIAGNOSIS — I872 Venous insufficiency (chronic) (peripheral): Secondary | ICD-10-CM | POA: Insufficient documentation

## 2016-12-09 DIAGNOSIS — E039 Hypothyroidism, unspecified: Secondary | ICD-10-CM | POA: Insufficient documentation

## 2016-12-09 DIAGNOSIS — Z7952 Long term (current) use of systemic steroids: Secondary | ICD-10-CM | POA: Insufficient documentation

## 2016-12-09 DIAGNOSIS — I110 Hypertensive heart disease with heart failure: Secondary | ICD-10-CM | POA: Insufficient documentation

## 2016-12-09 DIAGNOSIS — M4854XA Collapsed vertebra, not elsewhere classified, thoracic region, initial encounter for fracture: Secondary | ICD-10-CM | POA: Insufficient documentation

## 2016-12-09 DIAGNOSIS — M533 Sacrococcygeal disorders, not elsewhere classified: Secondary | ICD-10-CM | POA: Diagnosis not present

## 2016-12-09 DIAGNOSIS — M488X6 Other specified spondylopathies, lumbar region: Secondary | ICD-10-CM | POA: Insufficient documentation

## 2016-12-09 DIAGNOSIS — M4696 Unspecified inflammatory spondylopathy, lumbar region: Secondary | ICD-10-CM | POA: Diagnosis not present

## 2016-12-09 DIAGNOSIS — I8393 Asymptomatic varicose veins of bilateral lower extremities: Secondary | ICD-10-CM | POA: Insufficient documentation

## 2016-12-09 DIAGNOSIS — M069 Rheumatoid arthritis, unspecified: Secondary | ICD-10-CM | POA: Insufficient documentation

## 2016-12-09 DIAGNOSIS — Z825 Family history of asthma and other chronic lower respiratory diseases: Secondary | ICD-10-CM | POA: Insufficient documentation

## 2016-12-09 DIAGNOSIS — G894 Chronic pain syndrome: Secondary | ICD-10-CM | POA: Insufficient documentation

## 2016-12-09 DIAGNOSIS — K5900 Constipation, unspecified: Secondary | ICD-10-CM | POA: Insufficient documentation

## 2016-12-09 DIAGNOSIS — F329 Major depressive disorder, single episode, unspecified: Secondary | ICD-10-CM | POA: Diagnosis not present

## 2016-12-09 DIAGNOSIS — Z8261 Family history of arthritis: Secondary | ICD-10-CM | POA: Diagnosis not present

## 2016-12-09 DIAGNOSIS — Z888 Allergy status to other drugs, medicaments and biological substances status: Secondary | ICD-10-CM | POA: Insufficient documentation

## 2016-12-09 DIAGNOSIS — Z881 Allergy status to other antibiotic agents status: Secondary | ICD-10-CM | POA: Insufficient documentation

## 2016-12-09 DIAGNOSIS — M79604 Pain in right leg: Secondary | ICD-10-CM | POA: Diagnosis present

## 2016-12-09 DIAGNOSIS — R109 Unspecified abdominal pain: Secondary | ICD-10-CM | POA: Insufficient documentation

## 2016-12-09 DIAGNOSIS — R0781 Pleurodynia: Secondary | ICD-10-CM | POA: Insufficient documentation

## 2016-12-09 DIAGNOSIS — Z86010 Personal history of colonic polyps: Secondary | ICD-10-CM | POA: Diagnosis not present

## 2016-12-09 DIAGNOSIS — K219 Gastro-esophageal reflux disease without esophagitis: Secondary | ICD-10-CM | POA: Diagnosis not present

## 2016-12-09 DIAGNOSIS — M5416 Radiculopathy, lumbar region: Secondary | ICD-10-CM | POA: Diagnosis not present

## 2016-12-09 DIAGNOSIS — I509 Heart failure, unspecified: Secondary | ICD-10-CM | POA: Diagnosis not present

## 2016-12-09 DIAGNOSIS — I890 Lymphedema, not elsewhere classified: Secondary | ICD-10-CM | POA: Insufficient documentation

## 2016-12-09 DIAGNOSIS — Z885 Allergy status to narcotic agent status: Secondary | ICD-10-CM | POA: Insufficient documentation

## 2016-12-09 DIAGNOSIS — Z6821 Body mass index (BMI) 21.0-21.9, adult: Secondary | ICD-10-CM | POA: Insufficient documentation

## 2016-12-09 DIAGNOSIS — M47816 Spondylosis without myelopathy or radiculopathy, lumbar region: Secondary | ICD-10-CM

## 2016-12-09 MED ORDER — DICLOFENAC SODIUM 1 % TD GEL: 4.0000 g | Freq: Four times a day (QID) | TRANSDERMAL | 2 refills | Status: AC

## 2016-12-09 NOTE — Patient Instructions (Addendum)
Today we did the following  -Urine drug screen today  -prescription for Voltaren gel which she can apply to painful regions including the low back, knees, neck, shoulder -Follow up in 1 month at which point we can have you sign our opioid contract. We will initiate medication management with you at that time.  Thank you for visiting the [facility]:  -Remember to bring all your pain pills to every clinic visit, in their original containers. -Because of the high volume of calls we receive and the high demand for our clinical services, we are occupied all day providing care for patients in the clinic. This leaves little time to respond to phone calls, and we are generally unable to discuss patient care advice over the telephone. If you are experiencing a medication side effect or complication, you can call and let us know, but we will typically not make a medication substitution or change over the telephone. We are generally unable to respond acutely to a flare up of pain, as this is quite common in our patients and needs to be dealt with as part of the long term management plan. Please make an appointment with [place] if you wish to discuss a matter in any detail. Should you still need to call, please do so at . Please do not call for early medication refills.  Thank you for choosing [facility]. It was a pleasure to see you in clinic today. Please contact us with any questions or concerns at  Edward Jolly, MD

## 2016-12-09 NOTE — Progress Notes (Signed)
Safety precautions to be maintained throughout the outpatient stay will include: orient to surroundings, keep bed in low position, maintain call bell within reach at all times, provide assistance with transfer out of bed and ambulation.  

## 2016-12-09 NOTE — Progress Notes (Signed)
Patient's Name: Joanna Roberts  MRN: [id number]  Referring Provider: Marinda Elk, MD  DOB: [birth year]  PCP: Marinda Elk, MD  DOS: 12/09/2016  Note by: Gillis Santa, MD  Service setting: Ambulatory outpatient  Specialty: Interventional Pain Management  Location: [facility] (AMB) Pain Management Facility  Visit type: Initial Patient Evaluation  Patient type: New Patient   Primary Reason(s) for Visit: Encounter for initial evaluation of one or more chronic problems (new to examiner) potentially causing chronic pain, and posing a threat to normal musculoskeletal function. (Level of risk: High) CC: Leg Pain (right); Back Pain (low, mid); and Abdominal Pain (bilateral)  HPI  Joanna Roberts is a 83 y.o. year old, female patient, who comes today to see [place] for the first time for an initial evaluation of her chronic pain. She has Pneumonia; A-fib ([place]); Acquired hypothyroidism; Atrial fibrillation ([place]); Cardiomyopathy ([place]); Chronic abdominal pain; Chronic anemia; Back pain, chronic; CN (constipation); DD (diverticular disease); Fatty infiltration of liver; Aggrieved; Bergmann's syndrome; History of colon polyps; Personal history of urinary infection; History of urinary anomaly; Incomplete bladder emptying; Cannot sleep; Frequent UTI; Esophagitis, reflux; Pain in rib; Tachycardia-bradycardia ([place]); Temporary cerebral vascular dysfunction; FOM (frequency of micturition); B12 deficiency; DDD (degenerative disc disease), lumbar; Facet syndrome, lumbar ([place]); Sacroiliac joint dysfunction; Spinal stenosis, lumbar region, with neurogenic claudication; Lumbar radiculopathy; Compression fracture of thoracic spine, non-traumatic ([place]); Idiopathic scoliosis; H/O varicose vein stripping; Pain in limb; Chronic venous insufficiency; Chronic mesenteric ischemia ([place]); Hematoma of leg, right, initial encounter; Acute blood loss anemia; Sepsis ([place]); Protein-calorie malnutrition, severe; Lymphedema; and Varicose veins of both  lower extremities on her problem list. Today she comes in for evaluation of her Leg Pain (right); Back Pain (low, mid); and Abdominal Pain (bilateral)  Pain Assessment: Location: Right Leg Radiating: denies Onset: More than a month ago Duration: Chronic pain Quality: Sharp Severity: 9 /10 (self-reported pain score)  Note: Reported level is compatible with observation.                   Effect on ADL:   Timing: Constant Modifying factors:    Onset and Duration: Gradual and Date of onset: more than 20 years ago Cause of pain: Arthritis Severity: No change since onset Timing: Not influenced by the time of the day Aggravating Factors: Prolonged sitting and Prolonged standing Alleviating Factors: Resting Associated Problems: Depression, Swelling, Weakness, Pain that wakes patient up and Pain that does not allow patient to sleep Quality of Pain: Constant Previous Examinations or Tests: CT scan, X-rays, Orthopedic evaluation and Psychiatric evaluation Previous Treatments: Narcotic medications  The patient comes into the clinics today for the first time for a chronic pain management evaluation.  Patient is a very pleasant 83 year old female with a complex and extensive past medical history including cardiac history significant for atrial fibrillation, cardiomyopathy, peripheral vascular disease who is referred for evaluation of low back pain that is present on both sides with radiation to her right knee in a posterior dermatomal distribution involving the S1-S2 dermatomes primarily.  She notes pain has been going on for many months. She describes it as very severe and debilitating. It impacts her functional ability. Also has contributed to worsening depression. No thoughts of harming herself.  In terms of her medication regimen, patient takes gabapentin 300 mg daily at bedtime along with oxycodone 5 mg 2-3 times a day as needed for severe pain.   Today I took the time to provide the patient  with information regarding my pain practice.  The patient was informed that my practice is divided into two sections: an interventional pain management section, as well as a completely separate and distinct medication management section. I explained that I have procedure days for my interventional therapies, and evaluation days for follow-ups and medication management. Because of the amount of documentation required during both, they are kept separated. This means that there is the possibility that she may be scheduled for a procedure on one day, and medication management the next. I have also informed her that because of staffing and facility limitations, I no longer take patients for medication management only. To illustrate the reasons for this, I gave the patient the example of surgeons, and how inappropriate it would be to refer a patient to his/her care, just to write for the post-surgical antibiotics on a surgery done by a different surgeon.   Because interventional pain management is my board-certified specialty, the patient was informed that joining my practice means that they are open to any and all interventional therapies. I made it clear that this does not mean that they will be forced to have any procedures done. What this means is that I believe interventional therapies to be essential part of the diagnosis and proper management of chronic pain conditions. Therefore, patients not interested in these interventional alternatives will be better served under the care of a different practitioner.  The patient was also made aware of my Comprehensive Pain Management Safety Guidelines where by joining my practice, they limit all of their nerve blocks and joint injections to those done by our practice, for as long as we are retained to manage their care.   Historic Controlled Substance Pharmacotherapy Review  PMP and historical list of controlled substances: Oxycodone 5 mg, quantity 30, last fill  12/02/2016 Highest opioid analgesic regimen found: As above Most recent opioid analgesic: As above Current opioid analgesics: As above Highest recorded MME/day: 20-25 mg/day MME/day: 20-25 mg/day Medications: The patient did not bring the medication(s) to the appointment, as requested in our "New Patient Package" Pharmacodynamics: Desired effects: Analgesia: The patient reports >50% benefit. Reported improvement in function: The patient reports medication allows her to accomplish basic ADLs. Clinically meaningful improvement in function (CMIF): Sustained CMIF goals met Perceived effectiveness: Described as relatively effective, allowing for increase in activities of daily living (ADL) Undesirable effects: Side-effects or Adverse reactions: None reported Historical Monitoring: The patient  reports that she does not use drugs. List of all UDS Test(s): No results found for: MDMA, COCAINSCRNUR, PCPSCRNUR, PCPQUANT, CANNABQUANT, THCU, Ray List of all Serum Drug Screening Test(s):  No results found for: AMPHSCRSER, BARBSCRSER, BENZOSCRSER, COCAINSCRSER, PCPSCRSER, PCPQUANT, THCSCRSER, CANNABQUANT, OPIATESCRSER, OXYSCRSER, PROPOXSCRSER Historical Background Evaluation: [place] PDMP: Six (6) year initial data search conducted.             [place] Department of public safety, offender search: Editor, commissioning Information) Non-contributory Risk Assessment Profile: Aberrant behavior: None observed or detected today Risk factors for fatal opioid overdose: None identified today Fatal overdose hazard ratio (HR): Calculation deferred Non-fatal overdose hazard ratio (HR): Calculation deferred Risk of opioid abuse or dependence: 0.7-3.0% with doses ? 36 MME/day and 6.1-26% with doses ? 120 MME/day. Substance use disorder (SUD) risk level: Pending results of Medical Psychology Evaluation for SUD Opioid risk tool (ORT) (Total Score): 0     Opioid Risk Tool - 12/09/16 0853      Family History of Substance Abuse    Alcohol Negative   Illegal Drugs Negative   Rx Drugs Negative  Personal History of Substance Abuse   Alcohol Negative   Illegal Drugs Negative   Rx Drugs Negative     Age   Age between 32-45 years  No     History of Preadolescent Sexual Abuse   History of Preadolescent Sexual Abuse Negative or Female     Psychological Disease   Psychological Disease Negative   Depression Negative     Total Score   Opioid Risk Tool Scoring 0   Opioid Risk Interpretation Low Risk     ORT Scoring interpretation table:  Score <3 = Low Risk for SUD  Score between 4-7 = Moderate Risk for SUD  Score >8 = High Risk for Opioid Abuse   PHQ-2 Depression Scale:  Total score: 1    Pharmacologic Plan: Continue therapy as is            Initial impression: No immediate contraindications found.  Meds   Current Meds  Medication Sig  . amoxicillin-clavulanate (AUGMENTIN) 500-125 MG tablet   . budesonide (ENTOCORT EC) 3 MG 24 hr capsule Take 3 mg by mouth daily.   . carvedilol (COREG) 25 MG tablet TAKE ONE TABLET BY MOUTH TWICE DAILY WITH  MEALS  . ciprofloxacin (CIPRO) 250 MG tablet Take by mouth.  . docusate sodium (COLACE) 100 MG capsule Take 1 capsule (100 mg total) by mouth 2 (two) times daily as needed (take to keep stool soft.).  Marland Kitchen ferrous sulfate 325 (65 FE) MG tablet Take 1 tablet (325 mg total) by mouth daily with breakfast.  . furosemide (LASIX) 20 MG tablet Take 1 tablet (20 mg total) by mouth daily as needed for fluid.  Marland Kitchen gabapentin (NEURONTIN) 300 MG capsule Take 300 mg by mouth at bedtime.   Marland Kitchen levothyroxine (SYNTHROID, LEVOTHROID) 25 MCG tablet Take 25 mcg by mouth daily before breakfast.  . loperamide (ANTI-DIARRHEAL) 2 MG capsule Take 2 mg by mouth as needed for diarrhea or loose stools.  Marland Kitchen losartan (COZAAR) 25 MG tablet TAKE ONE TABLET EVERY DAY  . metroNIDAZOLE (FLAGYL) 500 MG tablet Take 500 mg by mouth.  . nitroGLYCERIN (NITROSTAT) 0.4 MG SL tablet Place 0.4 mg under the tongue  every 5 (five) minutes as needed for chest pain.   Marland Kitchen ondansetron (ZOFRAN) 4 MG tablet Take 4 mg by mouth every 8 (eight) hours as needed.   Derrill Memo ON 12/15/2016] oxyCODONE (OXY IR/ROXICODONE) 5 MG immediate release tablet Take 5 mg by mouth 3 (three) times daily as needed.   . silver sulfADIAZINE (SILVADENE) 1 % cream Apply 1 application topically 2 (two) times daily. Apply to area twice a day  . temazepam (RESTORIL) 7.5 MG capsule Take 7.5 mg by mouth at bedtime.   . vitamin B-12 (CYANOCOBALAMIN) 1000 MCG tablet Take 1,000 mcg by mouth daily.   Current Facility-Administered Medications for the 12/09/16 encounter (Office Visit) with Gillis Santa, MD  Medication  . mirabegron ER (MYRBETRIQ) tablet 25 mg    Imaging Review   Lumbar CT wo contrast:  Results for orders placed in visit on 11/20/15  CT LUMBAR SPINE WO CONTRAST    Lumbar DG Myelogram views:  Results for orders placed in visit on 03/17/99  DG Myelogram Lumbar   Narrative FINDINGS CLINICAL DATA:  LOW BACK AND LOWER EXTREMITY PAIN, RIGHT GREATER THAN LEFT. NO PREVIOUS LUMBAR SURGERY. LUMBAR MYELOGRAM AND CT LUMBAR SPINE WITH INTRATHECAL CONTRAST, 03/17/1999: TECHNIQUE: LUMBAR REGION PREPPED WITH BETADINE AND DRAPED IN THE USUAL STERILE FASHION.  SKIN OVERLYING THE LUMBAR SPINE WAS INFILTRATED  WITH ONE PERCENT LIDOCAINE.  UNDER FLUOROSCOPIC GUIDANCE, A 22 GAUGE SPINAL NEEDLE WAS ADVANCED INTO THE THECAL AT THE L2-3 INTERSPACE.  ONCE CLEAR COLORLESS CSF RETURNED, 17 ML OF OMNIPAQUE 180 WAS INSTILLED INTRATHECALLY FOR LUMBAR MYELOGRAPHY FOLLOWED BY AXIAL CT FROM L1 TO THE SACRUM.  NO IMMEDIATE COMPLICATIONS. FINDINGS:  FIVE NONRIBBEARING LUMBAR VERTEBRAL BODIES.  THERE IS A MILD LEVOROTOSCOLIOSIS OF THE LUMBAR SPINE.  STANDING LATERAL FLEXION AND EXTENSION RADIOGRAPHS DEMONSTRATE NO EVIDENCE OF DYNAMIC INSTABILITY OR SPONDYLOLISTHESIS. L1-2:  NORMAL CONUS AT THIS LEVEL.  SMALL ANTERIOR AND LATERAL END PLATE SPURS.  NO  SIGNIFICANT POSTERIOR DISK BULGE, PROTRUSION, OR HERNIATION.  CANAL AND NEURAL FORAMINA ARE WIDELY PATENT. L2-3:  MILD CIRCUMFERENTIAL DISK BULGE WITH ANTERIOR AND LATERAL END PLATE SPURRING.  THE BULGE DOES EXTEND INTO THE INFERIOR ASPECT OF THE LEFT L2-3 NEURAL FORAMEN BUT THIS IS BELOW THE EXITING LEFT L2 ROOT AND SLIGHTLY DISPLACES THE L3 ROOT MEDIALLY ABOVE THE LEVEL OF THE LATERAL RECESS. NEURAL FORAMINA AND CENTRAL CANAL ARE WIDELY PATENT. L3-4:  LEFT POSTEROLATERAL BULGE INTO THE INFERIOR ASPECT OF THE NEURAL FORAMEN.  THIS IS BELOW THE EXITING LEFT L3 ROOT BUT SLIGHTLY DISPLACES THE LEFT L4 ROOT MEDIALLY ABOVE THE LEVEL OF THE LATERAL RECESS.  THERE IS MILD FACET HYPERTROPHY AND SOME BUCKLING OF THE LIGAMENTUM FLAVUM BUT NO SIGNIFICANT CENTRAL CANAL STENOSIS.   NEURAL FORAMINA REMAIN WIDELY PATENT. L4-5:  CIRCUMFERENTIAL DISK BULGE.  BILATERAL FACET HYPERTROPHY WITH SOME BUCKLING OF THE LIGAMENTUM FLAVUM RESULTING IN MILD CENTRAL CANAL STENOSIS.  THE L5 ROOTS MAY BE SLIGHTLY COMPRESSED JUST ABOVE THE LEVEL OF THE LATERAL RECESSES ALTHOUGH THE NERVE ROOT SLEEVES FILL WELL BILATERALLY.  NEURAL FORAMINA ARE WIDELY PATENT. L5-S1:  NARROWING OF THE INTERSPACE WITH VACUUM PHENOMENON EVIDENT.  MILD CIRCUMFERENTIAL DISK BULGE WITH ANTERIOR AND LATERAL END PLATE SPURRING.  CENTRAL CANAL AND NEURAL FORAMINA WIDELY PATENT. INCIDENTALLY NOTED IS COARSE ATHEROSCLEROTIC CALCIFICATION ABOUT THE AORTIC BIFURCATION.  NO ANEURYSM. IMPRESSION MILD MULTIFACTORIAL SPINAL STENOSIS AT L4-5 WHICH MAY COMPRESS THE L5 ROOTS ABOVE THE LEVEL OF THE LATERAL RECESSES. MILD DISK PATHOLOGY AT L2-3, L3-4, AND L5-S1, SLIGHTLY WORSE ON LEFT THAN RIGHT.    Complexity Note: Imaging results reviewed. Results discussed using Layman's terms.               ROS  Cardiovascular History: Heart trouble, Abnormal heart rhythm, Heart attack ( Date: patient cannot remember date), Pacemaker or defibrillator, Heart failure, Weak  heart (CHF), Heart murmur, Heart valve problems, Heart catheterization and Needs antibiotics prior to dental procedures Pulmonary or Respiratory History: Lung problems, Shortness of breath and Snoring  Neurological History: Curved spine and Incontinence:  Urinary Review of Past Neurological Studies:  Results for orders placed or performed during the hospital encounter of 09/29/15  CT Head Wo Contrast   Narrative   CLINICAL DATA:  Memory lapse earlier today.  EXAM: CT HEAD WITHOUT CONTRAST  TECHNIQUE: Contiguous axial images were obtained from the base of the skull through the vertex without intravenous contrast.  COMPARISON:  October 31, 2013  FINDINGS: Paranasal sinuses, mastoid air cells, and bones are normal. Extracranial soft tissues are within normal limits. No subdural, epidural, or subarachnoid hemorrhage. No mass, mass effect or midline shift. Ventricles and sulci are prominent but stable. A lacunar infarct in the left basal ganglia stable. Mild white matter changes are noted. No acute cortical ischemia or infarct. The cerebellum, brainstem, and basal cisterns are normal.  IMPRESSION: No acute abnormality.   Electronically Signed   By:  Dorise Bullion III M.D   On: 09/29/2015 17:46    Psychological-Psychiatric History: Psychiatric disorder, Anxiousness, Depressed and Difficulty sleeping and or falling asleep Gastrointestinal History: Heartburn due to stomach pushing into lungs (Hiatal hernia) and Alternating episodes iof diarrhea and constipation (IBS-Irritable bowe syndrome) Genitourinary History: No reported renal or genitourinary signs or symptoms such as difficulty voiding or producing urine, peeing blood, non-functioning kidney, kidney stones, difficulty emptying the bladder, difficulty controlling the flow of urine, or chronic kidney disease Hematological History: Bleeding easily Endocrine History: Slow thyroid Rheumatologic History: Rheumatoid  arthritis Musculoskeletal History: Negative for myasthenia gravis, muscular dystrophy, multiple sclerosis or malignant hyperthermia Work History: Retired  Allergies  Ms. Lagerquist is allergic to lortab [hydrocodone-acetaminophen]; doxycycline; macrobid [nitrofurantoin monohyd macro]; and nitrofurantoin.  Laboratory Chemistry  Inflammation Markers (CRP: Acute Phase) (ESR: Chronic Phase) No results found for: CRP, ESRSEDRATE               Renal Function Markers Lab Results  Component Value Date   BUN 32 (H) 08/29/2016   CREATININE 1.01 (H) 08/29/2016   GFRAA 57 (L) 08/29/2016   GFRNONAA 49 (L) 08/29/2016                 Hepatic Function Markers Lab Results  Component Value Date   AST 28 06/02/2016   ALT 15 06/02/2016   ALBUMIN 2.7 (L) 06/02/2016   ALKPHOS 30 (L) 06/02/2016                 Electrolytes Lab Results  Component Value Date   NA 136 08/29/2016   K 4.8 08/29/2016   CL 106 08/29/2016   CALCIUM 8.2 (L) 08/29/2016   MG 1.7 03/09/2016                 Neuropathy Markers No results found for: [id number]               Bone Pathology Markers Lab Results  Component Value Date   ALKPHOS 30 (L) 06/02/2016   CALCIUM 8.2 (L) 08/29/2016                 Coagulation Parameters Lab Results  Component Value Date   INR 1.23 08/29/2016   LABPROT 15.6 (H) 08/29/2016   APTT 38 (H) 08/27/2016   PLT 120 (L) 08/29/2016                 Cardiovascular Markers Lab Results  Component Value Date   HGB 9.3 (L) 08/29/2016   HCT 22.0 (L) 08/29/2016                 Note: Lab results reviewed.  PFSH  Drug: Ms. Lapaglia  reports that she does not use drugs. Alcohol:  reports that she does not drink alcohol. Tobacco:  reports that she has never smoked. She has never used smokeless tobacco. Medical:  has a past medical history of AICD (automatic cardioverter/defibrillator) present; Allergy; Anemia; Arthritis; Cataract; CHF (congestive heart failure) ([place]); Depression; Dysrhythmia;  GERD (gastroesophageal reflux disease); Heart murmur; Hypertension; Myocardial infarction ([place]); Osteoporosis; Presence of permanent cardiac pacemaker; and Thyroid disease. Family: family history includes Arthritis in her father; Asthma in her father; Early death in her mother; Heart disease in her mother; Stroke in her sister.  Past Surgical History:  Procedure Laterality Date  . ABDOMINAL HYSTERECTOMY    . APPENDECTOMY    . HERNIA REPAIR    . INSERT / REPLACE / REMOVE PACEMAKER    . PERIPHERAL VASCULAR CATHETERIZATION Right 10/08/2014  Procedure: Lower Extremity Angiography;  Surgeon: Katha Cabal, MD;  Location: [facility];  Service: Cardiovascular;  Laterality: Right;  . PERIPHERAL VASCULAR CATHETERIZATION  10/08/2014   Procedure: Lower Extremity Intervention;  Surgeon: Katha Cabal, MD;  Location: [facility];  Service: Cardiovascular;;  . PERIPHERAL VASCULAR CATHETERIZATION N/A 03/23/2016   Procedure: Visceral Angiography;  Surgeon: Katha Cabal, MD;  Location: [facility];  Service: Cardiovascular;  Laterality: N/A;   Active Ambulatory Problems    Diagnosis Date Noted  . Pneumonia 03/11/2015  . A-fib ([place]) 03/18/2015  . Acquired hypothyroidism 09/17/2015  . Atrial fibrillation ([place]) 09/17/2015  . Cardiomyopathy ([place]) 09/17/2015  . Chronic abdominal pain 01/17/2014  . Chronic anemia 11/26/2014  . Back pain, chronic 09/17/2015  . CN (constipation) 01/17/2014  . DD (diverticular disease) 09/17/2015  . Fatty infiltration of liver 01/17/2014  . Aggrieved 09/17/2015  . 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 History of colon polyps 09/17/2015  . Personal history of urinary infection 09/17/2015  . History of urinary anomaly 06/21/2014  . Incomplete bladder emptying 06/24/2014  . Cannot sleep 09/17/2015  . Frequent UTI 06/24/2014  . Esophagitis, reflux 09/17/2015  . Pain in rib 11/06/2013  . Tachycardia-bradycardia ([place]) 09/17/2015  .  Temporary cerebral vascular dysfunction 11/06/2013  . FOM (frequency of micturition) 06/21/2014  . B12 deficiency 11/27/2014  . DDD (degenerative disc disease), lumbar 11/18/2015  . Facet syndrome, lumbar ([place]) 11/18/2015  . Sacroiliac joint dysfunction 11/18/2015  . Spinal stenosis, lumbar region, with neurogenic claudication 11/18/2015  . Lumbar radiculopathy 11/18/2015  . Compression fracture of thoracic spine, non-traumatic ([place]) 11/18/2015  . Idiopathic scoliosis 11/18/2015  . H/O varicose vein stripping 11/18/2015  . Pain in limb 02/12/2016  . Chronic venous insufficiency 02/12/2016  . Chronic mesenteric ischemia ([place]) 03/15/2016  . Hematoma of leg, right, initial encounter 03/23/2016  . Acute blood loss anemia 04/01/2016  . Sepsis ([place]) 06/01/2016  . Protein-calorie malnutrition, severe 06/03/2016  . Lymphedema 07/29/2016  . Varicose veins of both lower extremities 09/30/2016   Resolved Ambulatory Problems    Diagnosis Date Noted  . PNA (pneumonia) 03/11/2015  . Vascular disorder of lower extremity 04/25/2014  . Varicose veins of both lower extremities without ulcer or inflammation 02/12/2016  . Varicose veins of left leg with both ulcer and inflammation ([place]) 05/27/2016  . Hematoma of left thigh 08/27/2016   Past Medical History:  Diagnosis Date  . AICD (automatic cardioverter/defibrillator) present   . Allergy   . Anemia   . Arthritis   . Cataract   . CHF (congestive heart failure) ([place])   . Depression   . Dysrhythmia   . GERD (gastroesophageal reflux disease)   . Heart murmur   . Hypertension   . Myocardial infarction ([place])   . Osteoporosis   . Presence of permanent cardiac pacemaker   . Thyroid disease    Constitutional Exam  General appearance: cooperative and cachectic very pleasant Vitals:   12/09/16 0841  BP: 105/75  Pulse: 73  Resp: 18  Temp: 98.2 F (36.8 C)  SpO2: 100%  Weight: 127 lb (57.6 kg)  Height: '5\' 4"'$  (1.626 m)   BMI Assessment:  Estimated body mass index is 21.8 kg/m as calculated from the following:   Height as of this encounter: '5\' 4"'$  (1.626 m).   Weight as of this encounter: 127 lb (57.6 kg).  BMI interpretation table: BMI level Category Range association with higher incidence of chronic pain  <18 kg/m2 Underweight  18.5-24.9 kg/m2 Ideal body weight   25-29.9 kg/m2 Overweight Increased incidence by 20%  30-34.9 kg/m2 Obese (Class I) Increased incidence by 68%  35-39.9 kg/m2 Severe obesity (Class II) Increased incidence by 136%  >40 kg/m2 Extreme obesity (Class III) Increased incidence by 254%   BMI Readings from Last 4 Encounters:  12/09/16 21.80 kg/m  11/07/16 22.14 kg/m  10/21/16 22.85 kg/m  09/30/16 23.03 kg/m   Wt Readings from Last 4 Encounters:  12/09/16 127 lb (57.6 kg)  11/07/16 129 lb (58.5 kg)  10/21/16 129 lb (58.5 kg)  09/30/16 130 lb (59 kg)  Psych/Mental status: Alert, oriented x 3 (person, place, & time)       Eyes: PERLA Respiratory: No evidence of acute respiratory distress  Cervical Spine Area Exam  Skin & Axial Inspection: Paravertebral muscle atrophy Alignment: Asymmetric Functional ROM: Decreased ROM      Stability: No instability detected Muscle Tone/Strength: Functionally intact. No obvious neuro-muscular anomalies detected. Sensory (Neurological): Movement-associated pain Palpation: Complains of area being tender to palpation              Upper Extremity (UE) Exam    Side: Right upper extremity  Side: Left upper extremity  Skin & Extremity Inspection: Positive color changes  Skin & Extremity Inspection: Positive color changes  Functional ROM: Unrestricted ROM          Functional ROM: Unrestricted ROM          Muscle Tone/Strength: Mild-to-medarate deconditioning  Muscle Tone/Strength: Moderate-to-severe deconditioning  Sensory (Neurological): Movement-associated pain  Sensory (Neurological): Movement-associated pain  Palpation: No palpable anomalies               Palpation: No palpable anomalies              Specialized Test(s): Deferred         Specialized Test(s): Deferred          Thoracic Spine Area Exam  Skin & Axial Inspection: Significant thoracic kyphosis Alignment: Asymmetric Functional ROM: Decreased ROM Stability: No instability detected Muscle Tone/Strength: Functionally intact. No obvious neuro-muscular anomalies detected. Sensory (Neurological): Movement associated pain Muscle strength & Tone: Complains of area being tender to palpation overlying thoracic vertebra  Lumbar Spine Area Exam  Skin & Axial Inspection: Thoraco-lumbar Scoliosis Alignment: Asymmetric Functional ROM: Decreased ROM      Stability: No instability detected Muscle Tone/Strength: Functionally intact. No obvious neuro-muscular anomalies detected. Sensory (Neurological): Movement-associated pain Palpation: Tender       Provocative Tests: Lumbar Hyperextension and rotation test: Positive       Lumbar Lateral bending test: Positive       Patrick's Maneuver: evaluation deferred today                    Gait & Posture Assessment  Ambulation: Patient ambulates using a walker Gait: Age-related, senile gait pattern Posture: Thoracic kyphosis   Lower Extremity Exam    Side: Right lower extremity  Side: Left lower extremity  Skin & Extremity Inspection: Positive color changes, vascular changes noted, mild venous stasis edema   Skin & Extremity Inspection: Positive color changes  Functional ROM: Decreased ROM          Functional ROM: Unrestricted ROM          Muscle Tone/Strength: Functionally intact. No obvious neuro-muscular anomalies detected.  Muscle Tone/Strength: Functionally intact. No obvious neuro-muscular anomalies detected.  Sensory (Neurological): Movement-associated pain  Sensory (Neurological): Unimpaired  Palpation: Complains of area being tender to palpation  Palpation:  No palpable anomalies   Assessment  Primary Diagnosis & Pertinent Problem  List: The primary encounter diagnosis was DDD (degenerative disc disease), lumbar. Diagnoses of Facet syndrome, lumbar (HCC), Sacroiliac joint dysfunction, Non-traumatic compression fracture of twelfth thoracic vertebra, initial encounter [facility]), Other idiopathic scoliosis, thoracolumbar region, Lumbar radiculopathy, Chronic atrial fibrillation ([place]), Compression fracture of thoracic spine, non-traumatic, initial encounter ([place]), and Chronic pain syndrome were also pertinent to this visit.  Visit Diagnosis (New problems to examiner): 1. DDD (degenerative disc disease), lumbar   2. Facet syndrome, lumbar ([place])   3. Sacroiliac joint dysfunction   4. Non-traumatic compression fracture of twelfth thoracic vertebra, initial encounter ([place])   5. Other idiopathic scoliosis, thoracolumbar region   6. Lumbar radiculopathy   7. Chronic atrial fibrillation (HCC)   8. Compression fracture of thoracic spine, non-traumatic, initial encounter ([place])   9. Chronic pain syndrome    Plan of Care (Initial workup plan)    Patient is a very pleasant 83 year old female with a complex and extensive past medical history including cardiac history significant for atrial fibrillation, cardiomyopathy, peripheral vascular disease who is referred for evaluation of low back pain that is present on both sides with radiation to her right knee in a posterior dermatomal distribution involving the S1-S2 dermatomes primarily. Patient's low back pain which radiates to the right leg is multifactorial secondary to severe scoliosis, severe degenerative lumbar spondylosis with multilevel facetogenic disease, compression fractures of T12 and L4, and moderate spinal canal stenosis and lateral recess stenosis at L2-L3 and at L4-L5. Patient does have symptoms of spinal stenosis with neurogenic claudication.  In terms of her medication regimen, patient takes gabapentin 300 mg daily at bedtime along with oxycodone 5 mg 2-3 times a day as needed for  severe pain.    Patient is low risk for abuse/misuse of chronic opioid therapy. Today we will perform a urine drug screen. Patient states that she already has another prescription provided by her primary care physician for oxycodone 5 mg. Pending results from the urine drug screen which should only be positive for oxycodone, we will have the patient sign an opioid agreement at her next visit and we will take over her medication management as it pertains to pain medications. At this time the patient continue gabapentin 300 mg daily at bedtime and oxycodone 5 mg twice a day to 3 times a day when necessary. Patient also endorses deep achy pain and swelling prescribe her Voltaren gel that she can apply to her low back, knees, hips.  Plan: #1. Urine drug screen today. She'll be positive for oxycodone and its metabolites. #2. Voltaren gel prescription. #3. Next visit, so long as urine drug screen is appropriate, as patient sign opioid contract and can begin opioid therapy. Reasonable regimens could include oxycodone 5 mg twice a day to 3 times a day when necessary, Norco 7.5 mg twice a day to 3 times a day when necessary   Ordered Lab-work, Procedure(s), Referral(s), & Consult(s): Orders Placed This Encounter  Procedures  . Compliance Drug Analysis, Ur   Pharmacotherapy (current): Medications ordered:  Meds ordered this encounter  Medications  . diclofenac sodium (VOLTAREN) 1 % GEL    Sig: Apply 4 g topically 4 (four) times daily.    Dispense:  100 g    Refill:  2    Do not add this medication to the electronic "Automatic Refill" notification system. Patient may have prescription filled one day early if pharmacy is closed on scheduled refill date.  Medications administered during this visit: Ms. Boffa had no medications administered during this visit.   Pharmacological management options:  Opioid Analgesics: The patient was informed that there is no guarantee that she would be a candidate for  opioid analgesics. The decision will be made following CDC guidelines. This decision will be based on the results of diagnostic studies, as well as Ms. Sciara's risk profile.   Membrane stabilizer: To be determined at a later time  Muscle relaxant: To be determined at a later time  NSAID: To be determined at a later time  Other analgesic(s): To be determined at a later time   Interventional management options: Ms. Luba was informed that there is no guarantee that she would be a candidate for interventional therapies. The decision will be based on the results of diagnostic studies, as well as Ms. Fiorella's risk profile.  Procedure(s) under consideration:  -Bilateral genicular nerve blocks -Lumbar medial branch nerve blocks -Left posterior intercostal nerve blocks    Provider-requested follow-up: Return in about 4 weeks (around 01/06/2017).  Future Appointments Date Time Provider [place]  12/30/2016 10:30 AM [facility] 3 [facility] None  12/30/2016 11:30 AM Schnier, Dolores Lory, MD [facility] None  01/04/2017 10:30 AM Gillis Santa, MD [facility] None    Primary Care Physician: Marinda Elk, MD Location: [facility] Note by: Gillis Santa, M.D, Date: 12/09/2016; Time: 10:17 AM  Patient Instructions  Today we did the following  -Urine drug screen today  -prescription for Voltaren gel which she can apply to painful regions including the low back, knees, neck, shoulder -Follow up in 1 month at which point we can have you sign our opioid contract. We will initiate medication management with you at that time.  Thank you for visiting the [place]:  -Remember to bring all your pain pills to every clinic visit, in their original containers. -Because of the high volume of calls we receive and the high demand for our clinical services, we are occupied all day providing care for patients in the clinic. This leaves little time to respond to phone  calls, and we are generally unable to discuss patient care advice over the telephone. If you are experiencing a medication side effect or complication, you can call and let us know, but we will typically not make a medication substitution or change over the telephone. We are generally unable to respond acutely to a flare up of pain, as this is quite common in our patients and needs to be dealt with as part of the long term management plan. Please make an appointment with [place] if you wish to discuss a matter in any detail. Should you still need to call, please do so at . Please do not call for early medication refills.  Thank you for choosing [facility]. It was a pleasure to see you in clinic today. Please contact us with any questions or concerns at  Gillis Santa, MD

## 2016-12-10 DIAGNOSIS — R339 Retention of urine, unspecified: Secondary | ICD-10-CM | POA: Diagnosis not present

## 2016-12-10 DIAGNOSIS — Z4689 Encounter for fitting and adjustment of other specified devices: Secondary | ICD-10-CM | POA: Diagnosis not present

## 2016-12-10 DIAGNOSIS — N819 Female genital prolapse, unspecified: Secondary | ICD-10-CM | POA: Diagnosis not present

## 2016-12-14 LAB — COMPLIANCE DRUG ANALYSIS, UR

## 2016-12-29 ENCOUNTER — Other Ambulatory Visit (INDEPENDENT_AMBULATORY_CARE_PROVIDER_SITE_OTHER): Payer: Self-pay | Admitting: Vascular Surgery

## 2016-12-29 DIAGNOSIS — I739 Peripheral vascular disease, unspecified: Secondary | ICD-10-CM

## 2016-12-29 DIAGNOSIS — M79601 Pain in right arm: Secondary | ICD-10-CM | POA: Diagnosis not present

## 2016-12-29 DIAGNOSIS — F331 Major depressive disorder, recurrent, moderate: Secondary | ICD-10-CM | POA: Diagnosis not present

## 2016-12-30 ENCOUNTER — Encounter (INDEPENDENT_AMBULATORY_CARE_PROVIDER_SITE_OTHER): Payer: Self-pay | Admitting: Vascular Surgery

## 2016-12-30 ENCOUNTER — Ambulatory Visit (INDEPENDENT_AMBULATORY_CARE_PROVIDER_SITE_OTHER): Payer: PPO | Admitting: Vascular Surgery

## 2016-12-30 ENCOUNTER — Ambulatory Visit (INDEPENDENT_AMBULATORY_CARE_PROVIDER_SITE_OTHER): Payer: PPO

## 2016-12-30 VITALS — BP 137/90 | HR 68 | Resp 15 | Ht 60.0 in | Wt 125.0 lb

## 2016-12-30 DIAGNOSIS — M79604 Pain in right leg: Secondary | ICD-10-CM | POA: Diagnosis not present

## 2016-12-30 DIAGNOSIS — M79605 Pain in left leg: Secondary | ICD-10-CM

## 2016-12-30 DIAGNOSIS — I482 Chronic atrial fibrillation, unspecified: Secondary | ICD-10-CM

## 2016-12-30 DIAGNOSIS — I70213 Atherosclerosis of native arteries of extremities with intermittent claudication, bilateral legs: Secondary | ICD-10-CM | POA: Diagnosis not present

## 2016-12-30 DIAGNOSIS — I872 Venous insufficiency (chronic) (peripheral): Secondary | ICD-10-CM | POA: Diagnosis not present

## 2016-12-30 DIAGNOSIS — I739 Peripheral vascular disease, unspecified: Secondary | ICD-10-CM

## 2017-01-02 DIAGNOSIS — I7025 Atherosclerosis of native arteries of other extremities with ulceration: Secondary | ICD-10-CM | POA: Insufficient documentation

## 2017-01-02 NOTE — Progress Notes (Signed)
MRN : [id number]  Joanna Roberts is a 83 y.o. (Dec 16, 1928) female who presents with chief complaint of  Chief Complaint  Patient presents with  . Follow-up    2 month arterial  .  History of Present Illness: The patient returns to the office for followup and review of the noninvasive studies. There have been no interval changes in lower extremity symptoms. No change in her usual c/o of her claudication distance and her description of rest pain. She insists her left leg is worse than the right.  No new ulcers or wounds have occurred since the last visit she notes there is an ulcer on the left lateral malleolus.  There have been no significant changes to the patient's overall health care.  The patient denies amaurosis fugax or recent TIA symptoms. There are no recent neurological changes noted. The patient denies history of DVT, PE or superficial thrombophlebitis. The patient denies recent episodes of angina or shortness of breath.   Duplex ultrasound of the lower extremity arterial system shows monophasic tibial signal on the right and biphasic tibial signal on the left.  Althugh the duplex today shows occlusion of the right SFA CT angiogram from 4 weeks ago shows a widely patent SFA without hemodynamically significant stenosis.  ABI's are not changed compared to the last study from June 2018  Current Meds  Medication Sig  . budesonide (ENTOCORT EC) 3 MG 24 hr capsule Take 3 mg by mouth daily.   . carvedilol (COREG) 25 MG tablet TAKE ONE TABLET BY MOUTH TWICE DAILY  . diclofenac sodium (VOLTAREN) 1 % GEL Apply 4 g topically 4 (four) times daily.  . diphenoxylate-atropine (LOMOTIL) 2.5-0.025 MG tablet Take 1-2 tablets by mouth 4 (four) times daily as needed for diarrhea or loose stools.  . docusate sodium (COLACE) 100 MG capsule Take 1 capsule (100 mg total) by mouth 2 (two) times daily as needed (take to keep stool soft.).  Marland Kitchen ferrous sulfate 325 (65 FE) MG tablet Take 1 tablet (325 mg  total) by mouth daily with breakfast.  . furosemide (LASIX) 20 MG tablet Take 1 tablet (20 mg total) by mouth daily as needed for fluid.  Marland Kitchen HYDROcodone-acetaminophen (NORCO/VICODIN) 5-325 MG tablet Limit one half to one tab by mouth per day or 2-3 times per day if tolerated  . levothyroxine (SYNTHROID, LEVOTHROID) 25 MCG tablet TAKE 1 TABLET EVERY DAY ON EMPTY STOMACHWITH A GLASS OF WATER AT LEAST 30-60 MINBEFORE BREAKFAST  . loperamide (ANTI-DIARRHEAL) 2 MG capsule Take 2 mg by mouth as needed for diarrhea or loose stools.  Marland Kitchen losartan (COZAAR) 25 MG tablet TAKE ONE TABLET EVERY DAY  . mirtazapine (REMERON) 7.5 MG tablet   . nitroGLYCERIN (NITROSTAT) 0.4 MG SL tablet Place under the tongue.  . ondansetron (ZOFRAN) 4 MG tablet Take 4 mg by mouth every 8 (eight) hours as needed.   Marland Kitchen oxyCODONE (OXY IR/ROXICODONE) 5 MG immediate release tablet Take 5 mg by mouth 3 (three) times daily as needed.   . predniSONE (DELTASONE) 5 MG tablet   . silver sulfADIAZINE (SILVADENE) 1 % cream Apply 1 application topically 2 (two) times daily. Apply to area twice a day  . temazepam (RESTORIL) 7.5 MG capsule Take by mouth.  . vitamin B-12 (CYANOCOBALAMIN) 1000 MCG tablet Take 1,000 mcg by mouth daily.  . [DISCONTINUED] amoxicillin-clavulanate (AUGMENTIN) 500-125 MG tablet   . [DISCONTINUED] levothyroxine (SYNTHROID, LEVOTHROID) 25 MCG tablet Take 25 mcg by mouth daily before breakfast.   Current Facility-Administered  Medications for the 12/30/16 encounter (Office Visit) with Gilda Crease, Latina Craver, MD  Medication  . mirabegron ER (MYRBETRIQ) tablet 25 mg    Past Medical History:  Diagnosis Date  . AICD (automatic cardioverter/defibrillator) present   . Allergy   . Anemia   . Arthritis   . Cataract   . CHF (congestive heart failure) (HCC)   . Depression   . Dysrhythmia   . GERD (gastroesophageal reflux disease)   . Heart murmur   . Hypertension   . Myocardial infarction (HCC)   . Osteoporosis   . Presence  of permanent cardiac pacemaker   . Thyroid disease     Past Surgical History:  Procedure Laterality Date  . ABDOMINAL HYSTERECTOMY    . APPENDECTOMY    . HERNIA REPAIR    . INSERT / REPLACE / REMOVE PACEMAKER    . PERIPHERAL VASCULAR CATHETERIZATION Right 10/08/2014   Procedure: Lower Extremity Angiography;  Surgeon: Renford Dills, MD;  Location: [facility];  Service: Cardiovascular;  Laterality: Right;  . PERIPHERAL VASCULAR CATHETERIZATION  10/08/2014   Procedure: Lower Extremity Intervention;  Surgeon: Renford Dills, MD;  Location: [facility];  Service: Cardiovascular;;  . PERIPHERAL VASCULAR CATHETERIZATION N/A 03/23/2016   Procedure: Visceral Angiography;  Surgeon: Renford Dills, MD;  Location: [facility];  Service: Cardiovascular;  Laterality: N/A;    Social History Social History  Substance Use Topics  . Smoking status: Never Smoker  . Smokeless tobacco: Never Used  . Alcohol use No    Family History Family History  Problem Relation Age of Onset  . Early death Mother   . Heart disease Mother   . Arthritis Father   . Asthma Father   . Stroke Sister     Allergies  Allergen Reactions  . Lortab [Hydrocodone-Acetaminophen] Itching, Swelling and Rash  . Doxycycline Diarrhea  . Macrobid [Nitrofurantoin Monohyd Macro] Diarrhea and Nausea And Vomiting  . Nitrofurantoin Diarrhea, Nausea Only and Nausea And Vomiting     REVIEW OF SYSTEMS (Negative unless checked)  Constitutional: [] Weight loss  [] Fever  [] Chills Cardiac: [] Chest pain   [] Chest pressure   [] Palpitations   [] Shortness of breath when laying flat   [] Shortness of breath with exertion. Vascular:  [x] Pain in legs with walking   [x] Pain in legs at rest  [] History of DVT   [] Phlebitis   [x] Swelling in legs   [] Varicose veins   [] Non-healing ulcers Pulmonary:   [] Uses home oxygen   [] Productive cough   [] Hemoptysis   [] Wheeze  [] COPD   [] Asthma Neurologic:  [] Dizziness    [] Seizures   [] History of stroke   [] History of TIA  [] Aphasia   [] Vissual changes   [] Weakness or numbness in arm   [] Weakness or numbness in leg Musculoskeletal:   [] Joint swelling   [x] Joint pain   [x] Low back pain Hematologic:  [x] Easy bruising  [] Easy bleeding   [] Hypercoagulable state   [] Anemic Gastrointestinal:  [] Diarrhea   [] Vomiting  [] Gastroesophageal reflux/heartburn   [] Difficulty swallowing. Genitourinary:  [] Chronic kidney disease   [] Difficult urination  [] Frequent urination   [] Blood in urine Skin:  [x] Rashes   [x] Ulcers  Psychological:  [] History of anxiety   []  History of major depression.  Physical Examination  Vitals:   12/30/16 1132  BP: 137/90  Pulse: 68  Resp: 15  Weight: 56.7 kg (125 lb)  Height: 5' (1.524 m)   Body mass index is 24.41 kg/m. Gen: WD/WN, NAD Head: [place]/AT, No temporalis  wasting.  Ear/Nose/Throat: Hearing grossly intact, nares w/o erythema or drainage Eyes: PER, EOMI, sclera nonicteric.  Neck: Supple, no large masses.   Pulmonary:  Good air movement, no audible wheezing bilaterally, no use of accessory muscles.  Cardiac: RRR, no JVD Vascular: Scattered varicosities present some > 5 mm bilaterally.  moderate venous stasis changes to the legs bilaterally.  2+ soft pitting edema.  The area that she believes is an ulcer on the left ankle appears to be a small area of callous Vessel Right Left  Radial Palpable Palpable  PT Not Palpable Not Palpable  DP Not Palpable Not Palpable  Gastrointestinal: Non-distended. No guarding/no peritoneal signs.  Musculoskeletal: M/S 5/5 throughout.  No deformity or atrophy.  Neurologic: CN 2-12 intact. Symmetrical.  Speech is fluent. Motor exam as listed above. Psychiatric: Judgment intact, Mood & affect appropriate for pt's clinical situation. Dermatologic: venous rashes no overt ulcers noted.  No changes consistent with cellulitis. Lymph : No lichenification or skin changes of chronic lymphedema.  CBC Lab  Results  Component Value Date   WBC 12.1 (H) 08/29/2016   HGB 9.3 (L) 08/29/2016   HCT 22.0 (L) 08/29/2016   MCV 90.5 08/29/2016   PLT 120 (L) 08/29/2016    BMET    Component Value Date/Time   NA 136 08/29/2016 0240   NA 140 04/23/2014 1320   K 4.8 08/29/2016 0240   K 4.8 04/23/2014 1320   CL 106 08/29/2016 0240   CL 109 (H) 04/23/2014 1320   CO2 25 08/29/2016 0240   CO2 26 04/23/2014 1320   GLUCOSE 117 (H) 08/29/2016 0240   GLUCOSE 95 04/23/2014 1320   BUN 32 (H) 08/29/2016 0240   BUN 22 (H) 04/23/2014 1320   CREATININE 1.01 (H) 08/29/2016 0240   CREATININE 1.02 04/23/2014 1320   CALCIUM 8.2 (L) 08/29/2016 0240   CALCIUM 8.5 04/23/2014 1320   GFRNONAA 49 (L) 08/29/2016 0240   GFRNONAA 55 (L) 04/23/2014 1320   GFRNONAA 39 (L) 11/02/2013 0540   GFRAA 57 (L) 08/29/2016 0240   GFRAA >60 04/23/2014 1320   GFRAA 45 (L) 11/02/2013 0540   CrCl cannot be calculated (Patient's most recent lab result is older than the maximum 21 days allowed.).  COAG Lab Results  Component Value Date   INR 1.23 08/29/2016   INR 1.17 08/28/2016   INR 1.15 08/28/2016    Radiology No results found.   Assessment/Plan 1. Chronic venous insufficiency No surgery or intervention at this point in time.    I have had a long discussion with the patient regarding venous insufficiency and why it  causes symptoms. I have discussed with the patient the chronic skin changes that accompany venous insufficiency and the long term sequela such as infection and ulceration.  Patient will begin wearing graduated compression stockings class 1 (20-30 mmHg) or compression wraps on a daily basis a prescription was given. The patient will put the stockings on first thing in the morning and removing them in the evening. The patient is instructed specifically not to sleep in the stockings.    In addition, behavioral modification including several periods of elevation of the lower extremities during the day will be  continued. I have demonstrated that proper elevation is a position with the ankles at heart level.  The patient is instructed to begin routine exercise, especially walking on a daily basis  Following the review of the ultrasound the patient will follow up in 2-3 months to reassess the degree of swelling and  the control that graduated compression stockings or compression wraps  is offering.   The patient can be assessed for a Lymph Pump at that time  2. Pain in both lower extremities I do not see a true ulceration and her ASO is worse on the right  Therefore I do not believe that angiography is indicated at this time.  She does not wish to have any surgery and so to me it appears that her c/o are out of proportion to her findings.  I am concerned that she is more interested in pain medication and is not  3. Atherosclerosis of native artery of both lower extremities with intermittent claudication (HCC)  Recommend:  The patient has evidence of atherosclerosis of the lower extremities with claudication.  However, I do not find changes at this point in time that would suggest the need for an angiogram.  Noninvasive studies do not suggest clinically significant change.  No invasive studies, angiography or surgery at this time The patient should continue walking and begin a more formal exercise program.  The patient should continue antiplatelet therapy and aggressive treatment of the lipid abnormalities  No changes in the patient's medications at this time  The patient should continue wearing graduated compression socks 10-15 mmHg strength to control the mild edema.    4. Chronic atrial fibrillation (HCC) Continue antiarrhythmia medications as already ordered, these medications have been reviewed and there are no changes at this time.  Continue anticoagulation as ordered by Cardiology Service     Levora Dredge, MD  01/02/2017 1:37 PM

## 2017-01-04 ENCOUNTER — Encounter: Payer: Self-pay | Admitting: Student in an Organized Health Care Education/Training Program

## 2017-01-04 ENCOUNTER — Ambulatory Visit
Payer: PPO | Attending: Student in an Organized Health Care Education/Training Program | Admitting: Student in an Organized Health Care Education/Training Program

## 2017-01-04 VITALS — BP 103/67 | HR 73 | Temp 98.0°F | Resp 16 | Ht 64.0 in | Wt 124.0 lb

## 2017-01-04 DIAGNOSIS — G894 Chronic pain syndrome: Secondary | ICD-10-CM | POA: Insufficient documentation

## 2017-01-04 DIAGNOSIS — I700 Atherosclerosis of aorta: Secondary | ICD-10-CM | POA: Insufficient documentation

## 2017-01-04 DIAGNOSIS — I482 Chronic atrial fibrillation, unspecified: Secondary | ICD-10-CM

## 2017-01-04 DIAGNOSIS — Z86010 Personal history of colonic polyps: Secondary | ICD-10-CM | POA: Diagnosis not present

## 2017-01-04 DIAGNOSIS — M4804 Spinal stenosis, thoracic region: Secondary | ICD-10-CM | POA: Diagnosis not present

## 2017-01-04 DIAGNOSIS — M5416 Radiculopathy, lumbar region: Secondary | ICD-10-CM | POA: Diagnosis not present

## 2017-01-04 DIAGNOSIS — Z950 Presence of cardiac pacemaker: Secondary | ICD-10-CM | POA: Insufficient documentation

## 2017-01-04 DIAGNOSIS — M5116 Intervertebral disc disorders with radiculopathy, lumbar region: Secondary | ICD-10-CM | POA: Diagnosis not present

## 2017-01-04 DIAGNOSIS — M2580 Other specified joint disorders, unspecified joint: Secondary | ICD-10-CM | POA: Insufficient documentation

## 2017-01-04 DIAGNOSIS — R109 Unspecified abdominal pain: Secondary | ICD-10-CM | POA: Insufficient documentation

## 2017-01-04 DIAGNOSIS — M4125 Other idiopathic scoliosis, thoracolumbar region: Secondary | ICD-10-CM

## 2017-01-04 DIAGNOSIS — M4854XA Collapsed vertebra, not elsewhere classified, thoracic region, initial encounter for fracture: Secondary | ICD-10-CM | POA: Diagnosis not present

## 2017-01-04 DIAGNOSIS — I70219 Atherosclerosis of native arteries of extremities with intermittent claudication, unspecified extremity: Secondary | ICD-10-CM | POA: Insufficient documentation

## 2017-01-04 DIAGNOSIS — M4696 Unspecified inflammatory spondylopathy, lumbar region: Secondary | ICD-10-CM

## 2017-01-04 DIAGNOSIS — I219 Acute myocardial infarction, unspecified: Secondary | ICD-10-CM | POA: Diagnosis not present

## 2017-01-04 DIAGNOSIS — M533 Sacrococcygeal disorders, not elsewhere classified: Secondary | ICD-10-CM

## 2017-01-04 DIAGNOSIS — Z87440 Personal history of urinary (tract) infections: Secondary | ICD-10-CM | POA: Insufficient documentation

## 2017-01-04 DIAGNOSIS — F329 Major depressive disorder, single episode, unspecified: Secondary | ICD-10-CM | POA: Insufficient documentation

## 2017-01-04 DIAGNOSIS — D62 Acute posthemorrhagic anemia: Secondary | ICD-10-CM | POA: Diagnosis not present

## 2017-01-04 DIAGNOSIS — M48062 Spinal stenosis, lumbar region with neurogenic claudication: Secondary | ICD-10-CM | POA: Insufficient documentation

## 2017-01-04 DIAGNOSIS — M5136 Other intervertebral disc degeneration, lumbar region: Secondary | ICD-10-CM

## 2017-01-04 DIAGNOSIS — M79602 Pain in left arm: Secondary | ICD-10-CM | POA: Diagnosis not present

## 2017-01-04 DIAGNOSIS — I252 Old myocardial infarction: Secondary | ICD-10-CM | POA: Insufficient documentation

## 2017-01-04 DIAGNOSIS — E43 Unspecified severe protein-calorie malnutrition: Secondary | ICD-10-CM | POA: Insufficient documentation

## 2017-01-04 DIAGNOSIS — I890 Lymphedema, not elsewhere classified: Secondary | ICD-10-CM | POA: Insufficient documentation

## 2017-01-04 DIAGNOSIS — E039 Hypothyroidism, unspecified: Secondary | ICD-10-CM | POA: Insufficient documentation

## 2017-01-04 DIAGNOSIS — M79604 Pain in right leg: Secondary | ICD-10-CM | POA: Diagnosis not present

## 2017-01-04 DIAGNOSIS — Z95810 Presence of automatic (implantable) cardiac defibrillator: Secondary | ICD-10-CM | POA: Diagnosis not present

## 2017-01-04 DIAGNOSIS — I872 Venous insufficiency (chronic) (peripheral): Secondary | ICD-10-CM | POA: Diagnosis not present

## 2017-01-04 DIAGNOSIS — I429 Cardiomyopathy, unspecified: Secondary | ICD-10-CM | POA: Diagnosis not present

## 2017-01-04 DIAGNOSIS — S8011XA Contusion of right lower leg, initial encounter: Secondary | ICD-10-CM | POA: Diagnosis not present

## 2017-01-04 DIAGNOSIS — I509 Heart failure, unspecified: Secondary | ICD-10-CM | POA: Insufficient documentation

## 2017-01-04 DIAGNOSIS — I110 Hypertensive heart disease with heart failure: Secondary | ICD-10-CM | POA: Insufficient documentation

## 2017-01-04 DIAGNOSIS — M79601 Pain in right arm: Secondary | ICD-10-CM | POA: Diagnosis not present

## 2017-01-04 DIAGNOSIS — K5900 Constipation, unspecified: Secondary | ICD-10-CM | POA: Diagnosis not present

## 2017-01-04 DIAGNOSIS — M25551 Pain in right hip: Secondary | ICD-10-CM | POA: Diagnosis not present

## 2017-01-04 DIAGNOSIS — M47816 Spondylosis without myelopathy or radiculopathy, lumbar region: Secondary | ICD-10-CM

## 2017-01-04 DIAGNOSIS — I495 Sick sinus syndrome: Secondary | ICD-10-CM | POA: Diagnosis not present

## 2017-01-04 MED ORDER — OXYCODONE-ACETAMINOPHEN 5-325 MG PO TABS: 1.0000 | ORAL_TABLET | Freq: Two times a day (BID) | ORAL | 0 refills | Status: DC | PRN

## 2017-01-04 NOTE — Progress Notes (Signed)
Safety precautions to be maintained throughout the outpatient stay will include: orient to surroundings, keep bed in low position, maintain call bell within reach at all times, provide assistance with transfer out of bed and ambulation.  

## 2017-01-04 NOTE — Progress Notes (Signed)
Patient's Name: Joanna Roberts  MRN: [id number]  Referring Provider: Marinda Elk, MD  DOB: [birth year]  PCP: Marinda Elk, MD  DOS: 01/04/2017  Note by: Gillis Santa, MD  Service setting: Ambulatory outpatient  Specialty: Interventional Pain Management  Location: [facility] (AMB) Pain Management Facility    Patient type: Established   Primary Reason(s) for Visit: Encounter for evaluation before starting new chronic pain management plan of care (Level of risk: moderate) CC: Leg Pain (right); Hip Pain (right); and Arm Pain (left, upper)  HPI  [name] is a 83 y.o. year old, female patient, who comes today for a follow-up evaluation to review the test results and decide on a treatment plan. She has Pneumonia; A-fib ([place]); Acquired hypothyroidism; Atrial fibrillation ([place]); Cardiomyopathy ([place]); Chronic abdominal pain; Chronic anemia; Back pain, chronic; CN (constipation); DD (diverticular disease); Fatty infiltration of liver; Aggrieved; Bergmann's syndrome; History of colon polyps; Personal history of urinary infection; History of urinary anomaly; Incomplete bladder emptying; Cannot sleep; Frequent UTI; Esophagitis, reflux; Pain in rib; Tachycardia-bradycardia ([place]); Temporary cerebral vascular dysfunction; FOM (frequency of micturition); B12 deficiency; DDD (degenerative disc disease), lumbar; Facet syndrome, lumbar ([place]); Sacroiliac joint dysfunction; Spinal stenosis, lumbar region, with neurogenic claudication; Lumbar radiculopathy; Compression fracture of thoracic spine, non-traumatic ([place]); Idiopathic scoliosis; H/O varicose vein stripping; Chronic venous insufficiency; Chronic mesenteric ischemia ([place]); Hematoma of leg, right, initial encounter; Acute blood loss anemia; Sepsis ([place]); Protein-calorie malnutrition, severe; Lymphedema; Varicose veins of both lower extremities; and Atherosclerosis of native arteries of extremity with intermittent claudication (HCC) on her problem list. Her primarily  concern today is the Leg Pain (right); Hip Pain (right); and Arm Pain (left, upper)  Pain Assessment: Location: Left Hip Radiating: n/a Onset: More than a month ago Duration: Chronic pain Quality: Aching Severity: 8 /10 (self-reported pain score)  Note: Reported level is compatible with observation.                   Effect on ADL:   Timing: Intermittent Modifying factors: medication  [name] comes in today for a follow-up visit after her initial evaluation on 12/09/2016. Today we went over the results of her tests. These were explained in "Layman's terms". During today's appointment we went over my diagnostic impression, as well as the proposed treatment plan.  In considering the treatment plan options, Ms. Taitano was reminded that I no longer take patients for medication management only. I asked her to let me know if she had no intention of taking advantage of the interventional therapies, so that we could make arrangements to provide this space to someone interested. I also made it clear that undergoing interventional therapies for the purpose of getting pain medications is very inappropriate on the part of a patient, and it will not be tolerated in this practice. This type of behavior would suggest true addiction and therefore it requires referral to an addiction specialist.   Further details on both, my assessment(s), as well as the proposed treatment plan, please see below.  Controlled Substance Pharmacotherapy Assessment REMS (Risk Evaluation and Mitigation Strategy)  Analgesic: Percocet 5 mg twice a day when necessary MME/day: Approximately 15 mg/day. Pill Count: None expected due to no prior prescriptions written by our practice. Landis Martins, RN  01/04/2017 10:42 AM  Sign at close encounter Safety precautions to be maintained throughout the outpatient stay will include: orient to surroundings, keep bed in low position, maintain call bell within reach at all times, provide  assistance with transfer out  of bed and ambulation.    Pharmacokinetics: Liberation and absorption (onset of action): WNL Distribution (time to peak effect): WNL Metabolism and excretion (duration of action): WNL         Pharmacodynamics: Desired effects: Analgesia: Ms. Farrey reports >50% benefit. Functional ability: Patient reports that medication allows her to accomplish basic ADLs Clinically meaningful improvement in function (CMIF): Sustained CMIF goals met Perceived effectiveness: Described as relatively effective, allowing for increase in activities of daily living (ADL) Undesirable effects: Side-effects or Adverse reactions: None reported Monitoring: [place] PMP: Online review of the past 19-monthperiod previously conducted. Not applicable at this point since we have not taken over the patient's medication management yet. List of all Serum Drug Screening Test(s):  No results found for: AMPHSCRSER, BARBSCRSER, BENZOSCRSER, COCAINSCRSER, PCPSCRSER, THCSCRSER, OPIATESCRSER, OColma PDardanelleList of all UDS test(s) done:  Lab Results  Component Value Date   TOXASSSELUR FINAL 11/18/2015   SUMMARY FINAL 12/09/2016   Last UDS on record: ToxAssure Select 13  Date Value Ref Range Status  11/18/2015 FINAL  Final    Comment:    ==================================================================== TOXASSURE SELECT 13 (MW) ==================================================================== Test                             Result       Flag       Units Drug Present and Declared for Prescription Verification   Oxazepam                       2919         EXPECTED   ng/mg creat   Temazepam                      15871        EXPECTED   ng/mg creat    Oxazepam and temazepam are expected metabolites of diazepam.    Oxazepam is also an expected metabolite of other benzodiazepine    drugs, including chlordiazepoxide, prazepam, clorazepate,    halazepam, and temazepam.  Oxazepam and temazepam  are available    as scheduled prescription medications.   Hydrocodone                    662          EXPECTED   ng/mg creat   Hydromorphone                  290          EXPECTED   ng/mg creat   Norhydrocodone                 1148         EXPECTED   ng/mg creat    Sources of hydrocodone include scheduled prescription    medications. Hydromorphone and norhydrocodone are expected    metabolites of hydrocodone. Hydromorphone is also available as a    scheduled prescription medication. Drug Absent but Declared for Prescription Verification   Oxycodone                      Not Detected UNEXPECTED ng/mg creat ==================================================================== Test                      Result    Flag   Units      Ref Range   Creatinine  21               mg/dL      >=20 ==================================================================== Declared Medications:  The flagging and interpretation on this report are based on the  following declared medications.  Unexpected results may arise from  inaccuracies in the declared medications.  **Note: The testing scope of this panel includes these medications:  Hydrocodone (Hydrocodone-Acetaminophen)  Oxycodone (Oxycodone Acetaminophen)  Temazepam  **Note: The testing scope of this panel does not include following  reported medications:  Acetaminophen (Hydrocodone-Acetaminophen)  Acetaminophen (Oxycodone Acetaminophen)  Carvedilol  Cephalexin  Docusate  Estrogens  Furosemide  Levothyroxine  Losartan  Nitroglycerin  Silver  Venlafaxine  Warfarin ==================================================================== For clinical consultation, please call [phone]. ====================================================================    Summary  Date Value Ref Range Status  12/09/2016 FINAL  Final    Comment:    ==================================================================== TOXASSURE COMP DRUG  ANALYSIS,UR ==================================================================== Test                             Result       Flag       Units Drug Present and Declared for Prescription Verification   Oxazepam                       551          EXPECTED   ng/mg creat   Temazepam                      >2105        EXPECTED   ng/mg creat    Oxazepam and temazepam are expected metabolites of diazepam.    Oxazepam is also an expected metabolite of other benzodiazepine    drugs, including chlordiazepoxide, prazepam, clorazepate,    halazepam, and temazepam.  Oxazepam and temazepam are available    as scheduled prescription medications.   Oxycodone                      1667         EXPECTED   ng/mg creat   Oxymorphone                    697          EXPECTED   ng/mg creat   Noroxycodone                   1253         EXPECTED   ng/mg creat   Noroxymorphone                 118          EXPECTED   ng/mg creat    Sources of oxycodone are scheduled prescription medications.    Oxymorphone, noroxycodone, and noroxymorphone are expected    metabolites of oxycodone. Oxymorphone is also available as a    scheduled prescription medication.   Gabapentin                     PRESENT      EXPECTED   Acetaminophen                  PRESENT      EXPECTED Drug Absent but Declared for Prescription Verification   Hydrocodone  Not Detected UNEXPECTED ng/mg creat   Diclofenac                     Not Detected UNEXPECTED    Diclofenac, as indicated in the declared medication list, is not    always detected even when used as directed. ==================================================================== Test                      Result    Flag   Units      Ref Range   Creatinine              95               mg/dL      >=20 ==================================================================== Declared Medications:  The flagging and interpretation on this report are based on the  following declared  medications.  Unexpected results may arise from  inaccuracies in the declared medications.  **Note: The testing scope of this panel includes these medications:  Gabapentin  Hydrocodone (Hydrocodone-Acetaminophen)  Oxycodone  Oxycodone (Oxycodone Acetaminophen)  Temazepam  **Note: The testing scope of this panel does not include small to  moderate amounts of these reported medications:  Acetaminophen (Hydrocodone-Acetaminophen)  Acetaminophen (Oxycodone Acetaminophen)  Diclofenac  **Note: The testing scope of this panel does not include following  reported medications:  Amoxicillin (Augmentin)  Atropine (Lomotil)  Budesonide  Carvedilol  Ciprofloxacin  Clavulinate (Augmentin)  Cyanocobalamin  Diphenoxylate (Lomotil)  Docusate (Colace)  Ferrous Gluconate  Furosemide  Levothyroxine  Loperamide  Losartan (Losartan Potassium)  Metronidazole  Nitroglycerin  Ondansetron  Prednisone  Silver  Sulfamethoxazole (Trimethoprim-Sulfamethoxazole)  Trimethoprim (Trimethoprim-Sulfamethoxazole) ==================================================================== For clinical consultation, please call [phone]. ====================================================================    UDS interpretation: No unexpected findings.          Medication Assessment Form: Patient introduced to form today Treatment compliance: Treatment may start today if patient agrees with proposed plan. Evaluation of compliance is not applicable at this point Risk Assessment Profile: Aberrant behavior: See initial evaluations. None observed or detected today Comorbid factors increasing risk of overdose: See initial evaluation. No additional risks detected today Medical Psychology Evaluation: Low Risk     Opioid Risk Tool - 12/09/16 0853      Family History of Substance Abuse   Alcohol Negative   Illegal Drugs Negative   Rx Drugs Negative     Personal History of Substance Abuse   Alcohol Negative    Illegal Drugs Negative   Rx Drugs Negative     Age   Age between 53-45 years  No     History of Preadolescent Sexual Abuse   History of Preadolescent Sexual Abuse Negative or Female     Psychological Disease   Psychological Disease Negative   Depression Negative     Total Score   Opioid Risk Tool Scoring 0   Opioid Risk Interpretation Low Risk     ORT Scoring interpretation table:  Score <3 = Low Risk for SUD  Score between 4-7 = Moderate Risk for SUD  Score >8 = High Risk for Opioid Abuse   Risk Mitigation Strategies:  Patient opioid safety counseling: Completed today. Counseling provided to patient as per "Patient Counseling Document". Document signed by patient, attesting to counseling and understanding Patient-Prescriber Agreement (PPA): Obtained today.  Controlled substance notification to other providers: Written and sent today.  Pharmacologic Plan: Today we may be taking over the patient's pharmacological regimen. See below  Laboratory Chemistry  Inflammation Markers (CRP: Acute Phase) (ESR: Chronic Phase) No results found for: CRP, ESRSEDRATE               Renal Function Markers Lab Results  Component Value Date   BUN 32 (H) 08/29/2016   CREATININE 1.01 (H) 08/29/2016   GFRAA 57 (L) 08/29/2016   GFRNONAA 49 (L) 08/29/2016                 Hepatic Function Markers Lab Results  Component Value Date   AST 28 06/02/2016   ALT 15 06/02/2016   ALBUMIN 2.7 (L) 06/02/2016   ALKPHOS 30 (L) 06/02/2016                 Electrolytes Lab Results  Component Value Date   NA 136 08/29/2016   K 4.8 08/29/2016   CL 106 08/29/2016   CALCIUM 8.2 (L) 08/29/2016   MG 1.7 03/09/2016                 Neuropathy Markers No results found for: [id number]               Bone Pathology Markers Lab Results  Component Value Date   ALKPHOS 30 (L) 06/02/2016   CALCIUM 8.2 (L) 08/29/2016                 Coagulation Parameters Lab Results  Component Value Date    INR 1.23 08/29/2016   LABPROT 15.6 (H) 08/29/2016   APTT 38 (H) 08/27/2016   PLT 120 (L) 08/29/2016                 Cardiovascular Markers Lab Results  Component Value Date   HGB 9.3 (L) 08/29/2016   HCT 22.0 (L) 08/29/2016                 Note: Lab results reviewed.  Recent Diagnostic Imaging Review  Ct Angio Chest Pe W Or Wo Contrast  Result Date: 11/17/2016 CLINICAL DATA:  83 year old female with a history of shortness of breath and chest pain EXAM: CT ANGIOGRAPHY CHEST WITH CONTRAST TECHNIQUE: Multidetector CT imaging of the chest was performed using the standard protocol during bolus administration of intravenous contrast. Multiplanar CT image reconstructions and MIPs were obtained to evaluate the vascular anatomy. CONTRAST:  75 cc Isovue 370 COMPARISON:  Chest x-ray 08/27/2016, CT 03/31/2016 FINDINGS: Cardiovascular: Heart: Heart is enlarged, compatible with findings on prior chest x-ray imaging. Trace pericardial fluid/ thickening at the posterior aspect. Cardiac pacer/AICD on the left chest wall with leads terminating in the coronary sinus, and right ventricle. Calcifications of left main, left anterior descending, circumflex coronary arteries. Aorta: Greatest diameter of the ascending aorta measures 3.9 cm. Calcifications of the aortic arch and descending thoracic aorta. Pulmonary arteries: No central, lobar, segmental, or proximal subsegmental filling defects. Mediastinum/Nodes: Mediastinal lymph nodes are present, none of which are enlarged by CT size criteria. Unremarkable appearance of the thoracic esophagus. Unremarkable appearance of the thoracic inlet Lungs/Pleura: Centrilobular distribution of small nodules of the right upper lobe posteriorly with minimal ground-glass attenuation of the adjacent parenchyma. More mild nodularity of the right middle lobe and anteriorly at the right lower lobe, as well as in the right costophrenic sulcus. On the left there are changes of more mild  peripheral nodularity of posterior left lower lobe, within the inferior lingula, and within the costophrenic sulcus. No pleural effusion. Upper Abdomen: Unremarkable. Musculoskeletal: Similar appearance of T12 compression fracture, present on the comparison imaging. Multilevel  degenerative changes of the thoracic spine. Review of the MIP images confirms the above findings. IMPRESSION: Negative for pulmonary emboli. Pattern of centrilobular nodularity of predominantly right upper lobe, though also right middle lobe right lower lobe, as well as within the left lung. Findings are most likely inflammatory/infectious, however, a follow-up chest CT is warranted after the patient has been treated to assure resolution and rule out malignancy. Cardiomegaly with left main and 3 vessel coronary artery disease. Trace pericardial fluid/thickening. Aortic Atherosclerosis (ICD10-I70.0). Electronically Signed   By: Gilmer Mor D.O.   On: 11/17/2016 15:12   Lumbar DG Myelogram views:  Results for orders placed in visit on 03/17/99  DG Myelogram Lumbar   Narrative FINDINGS CLINICAL DATA:  LOW BACK AND LOWER EXTREMITY PAIN, RIGHT GREATER THAN LEFT. NO PREVIOUS LUMBAR SURGERY. LUMBAR MYELOGRAM AND CT LUMBAR SPINE WITH INTRATHECAL CONTRAST, 03/17/1999: TECHNIQUE: LUMBAR REGION PREPPED WITH BETADINE AND DRAPED IN THE USUAL STERILE FASHION.  SKIN OVERLYING THE LUMBAR SPINE WAS INFILTRATED WITH ONE PERCENT LIDOCAINE.  UNDER FLUOROSCOPIC GUIDANCE, A 22 GAUGE SPINAL NEEDLE WAS ADVANCED INTO THE THECAL AT THE L2-3 INTERSPACE.  ONCE CLEAR COLORLESS CSF RETURNED, 17 ML OF OMNIPAQUE 180 WAS INSTILLED INTRATHECALLY FOR LUMBAR MYELOGRAPHY FOLLOWED BY AXIAL CT FROM L1 TO THE SACRUM.  NO IMMEDIATE COMPLICATIONS. FINDINGS:  FIVE NONRIBBEARING LUMBAR VERTEBRAL BODIES.  THERE IS A MILD LEVOROTOSCOLIOSIS OF THE LUMBAR SPINE.  STANDING LATERAL FLEXION AND EXTENSION RADIOGRAPHS DEMONSTRATE NO EVIDENCE OF DYNAMIC INSTABILITY OR  SPONDYLOLISTHESIS. L1-2:  NORMAL CONUS AT THIS LEVEL.  SMALL ANTERIOR AND LATERAL END PLATE SPURS.  NO SIGNIFICANT POSTERIOR DISK BULGE, PROTRUSION, OR HERNIATION.  CANAL AND NEURAL FORAMINA ARE WIDELY PATENT. L2-3:  MILD CIRCUMFERENTIAL DISK BULGE WITH ANTERIOR AND LATERAL END PLATE SPURRING.  THE BULGE DOES EXTEND INTO THE INFERIOR ASPECT OF THE LEFT L2-3 NEURAL FORAMEN BUT THIS IS BELOW THE EXITING LEFT L2 ROOT AND SLIGHTLY DISPLACES THE L3 ROOT MEDIALLY ABOVE THE LEVEL OF THE LATERAL RECESS. NEURAL FORAMINA AND CENTRAL CANAL ARE WIDELY PATENT. L3-4:  LEFT POSTEROLATERAL BULGE INTO THE INFERIOR ASPECT OF THE NEURAL FORAMEN.  THIS IS BELOW THE EXITING LEFT L3 ROOT BUT SLIGHTLY DISPLACES THE LEFT L4 ROOT MEDIALLY ABOVE THE LEVEL OF THE LATERAL RECESS.  THERE IS MILD FACET HYPERTROPHY AND SOME BUCKLING OF THE LIGAMENTUM FLAVUM BUT NO SIGNIFICANT CENTRAL CANAL STENOSIS.   NEURAL FORAMINA REMAIN WIDELY PATENT. L4-5:  CIRCUMFERENTIAL DISK BULGE.  BILATERAL FACET HYPERTROPHY WITH SOME BUCKLING OF THE LIGAMENTUM FLAVUM RESULTING IN MILD CENTRAL CANAL STENOSIS.  THE L5 ROOTS MAY BE SLIGHTLY COMPRESSED JUST ABOVE THE LEVEL OF THE LATERAL RECESSES ALTHOUGH THE NERVE ROOT SLEEVES FILL WELL BILATERALLY.  NEURAL FORAMINA ARE WIDELY PATENT. L5-S1:  NARROWING OF THE INTERSPACE WITH VACUUM PHENOMENON EVIDENT.  MILD CIRCUMFERENTIAL DISK BULGE WITH ANTERIOR AND LATERAL END PLATE SPURRING.  CENTRAL CANAL AND NEURAL FORAMINA WIDELY PATENT. INCIDENTALLY NOTED IS COARSE ATHEROSCLEROTIC CALCIFICATION ABOUT THE AORTIC BIFURCATION.  NO ANEURYSM. IMPRESSION MILD MULTIFACTORIAL SPINAL STENOSIS AT L4-5 WHICH MAY COMPRESS THE L5 ROOTS ABOVE THE LEVEL OF THE LATERAL RECESSES. MILD DISK PATHOLOGY AT L2-3, L3-4, AND L5-S1, SLIGHTLY WORSE ON LEFT THAN RIGHT.      Note: Results of ordered imaging test(s) reviewed and explained to patient in Layman's terms. Copy of results provided to patient  Meds   Current  Outpatient Prescriptions:  .  budesonide (ENTOCORT EC) 3 MG 24 hr capsule, Take 3 mg by mouth daily. , Disp: , Rfl:  .  carvedilol (COREG) 25 MG tablet,  TAKE ONE TABLET BY MOUTH TWICE DAILY, Disp: , Rfl:  .  diclofenac sodium (VOLTAREN) 1 % GEL, Apply 4 g topically 4 (four) times daily., Disp: 100 g, Rfl: 2 .  diphenoxylate-atropine (LOMOTIL) 2.5-0.025 MG tablet, Take 1-2 tablets by mouth 4 (four) times daily as needed for diarrhea or loose stools., Disp: 40 tablet, Rfl: 0 .  docusate sodium (COLACE) 100 MG capsule, Take 1 capsule (100 mg total) by mouth 2 (two) times daily as needed (take to keep stool soft.)., Disp: 60 capsule, Rfl: 0 .  ferrous sulfate 325 (65 FE) MG tablet, Take 1 tablet (325 mg total) by mouth daily with breakfast., Disp: 30 tablet, Rfl: 0 .  furosemide (LASIX) 20 MG tablet, Take 1 tablet (20 mg total) by mouth daily as needed for fluid., Disp: , Rfl:  .  gabapentin (NEURONTIN) 300 MG capsule, TAKE 1 CAPSULE BY MOUTH ONCE DAILY, Disp: , Rfl:  .  levothyroxine (SYNTHROID, LEVOTHROID) 25 MCG tablet, TAKE 1 TABLET EVERY DAY ON EMPTY STOMACHWITH A GLASS OF WATER AT LEAST 30-60 MINBEFORE BREAKFAST, Disp: , Rfl:  .  loperamide (ANTI-DIARRHEAL) 2 MG capsule, Take 2 mg by mouth as needed for diarrhea or loose stools., Disp: , Rfl:  .  losartan (COZAAR) 25 MG tablet, TAKE ONE TABLET EVERY DAY, Disp: , Rfl:  .  mirtazapine (REMERON) 7.5 MG tablet, , Disp: , Rfl:  .  nitroGLYCERIN (NITROSTAT) 0.4 MG SL tablet, Place under the tongue., Disp: , Rfl:  .  ondansetron (ZOFRAN) 4 MG tablet, Take 4 mg by mouth every 8 (eight) hours as needed. , Disp: , Rfl:  .  silver sulfADIAZINE (SILVADENE) 1 % cream, Apply 1 application topically 2 (two) times daily. Apply to area twice a day, Disp: 25 g, Rfl: 2 .  temazepam (RESTORIL) 7.5 MG capsule, Take by mouth., Disp: , Rfl:  .  vitamin B-12 (CYANOCOBALAMIN) 1000 MCG tablet, Take 1,000 mcg by mouth daily., Disp: , Rfl:  .  oxyCODONE-acetaminophen  (PERCOCET/ROXICET) 5-325 MG tablet, Take 1 tablet by mouth 2 (two) times daily as needed for severe pain., Disp: 60 tablet, Rfl: 0 .  predniSONE (DELTASONE) 5 MG tablet, , Disp: , Rfl:   Current Facility-Administered Medications:  .  mirabegron ER (MYRBETRIQ) tablet 25 mg, 25 mg, Oral, Daily, Rubie Maid, MD  ROS  Constitutional: Denies any fever or chills Gastrointestinal: No reported hemesis, hematochezia, vomiting, or acute GI distress Musculoskeletal: Denies any acute onset joint swelling, redness, loss of ROM, or weakness Neurological: No reported episodes of acute onset apraxia, aphasia, dysarthria, agnosia, amnesia, paralysis, loss of coordination, or loss of consciousness  Allergies  Ms. Gronewold is allergic to lortab [hydrocodone-acetaminophen]; doxycycline; macrobid [nitrofurantoin monohyd macro]; and nitrofurantoin.  PFSH  Drug: Ms. Ahlberg  reports that she does not use drugs. Alcohol:  reports that she does not drink alcohol. Tobacco:  reports that she has never smoked. She has never used smokeless tobacco. Medical:  has a past medical history of AICD (automatic cardioverter/defibrillator) present; Allergy; Anemia; Arthritis; Cataract; CHF (congestive heart failure) ([place]); Depression; Dysrhythmia; GERD (gastroesophageal reflux disease); Heart murmur; Hypertension; Myocardial infarction ([place]); Osteoporosis; Presence of permanent cardiac pacemaker; and Thyroid disease. Surgical: Ms. Kook  has a past surgical history that includes Hernia repair; Insert / replace / remove pacemaker; Appendectomy; Abdominal hysterectomy; Cardiac catheterization (Right, 10/08/2014); Cardiac catheterization (10/08/2014); and Cardiac catheterization (N/A, 03/23/2016). Family: family history includes Arthritis in her father; Asthma in her father; Early death in her mother; Heart disease in her mother;  Stroke in her sister.  Constitutional Exam  General appearance: Well nourished, well developed, and well  hydrated. In no apparent acute distress Vitals:   01/04/17 1039  BP: 103/67  Pulse: 73  Resp: 16  Temp: 98 F (36.7 C)  TempSrc: Oral  SpO2: 100%  Weight: 124 lb (56.2 kg)  Height: '5\' 4"'$  (1.626 m)   BMI Assessment: Estimated body mass index is 21.28 kg/m as calculated from the following:   Height as of this encounter: '5\' 4"'$  (1.626 m).   Weight as of this encounter: 124 lb (56.2 kg).  BMI interpretation table: BMI level Category Range association with higher incidence of chronic pain  <18 kg/m2 Underweight   18.5-24.9 kg/m2 Ideal body weight   25-29.9 kg/m2 Overweight Increased incidence by 20%  30-34.9 kg/m2 Obese (Class I) Increased incidence by 68%  35-39.9 kg/m2 Severe obesity (Class II) Increased incidence by 136%  >40 kg/m2 Extreme obesity (Class III) Increased incidence by 254%   BMI Readings from Last 4 Encounters:  01/04/17 21.28 kg/m  12/30/16 24.41 kg/m  12/09/16 21.80 kg/m  11/07/16 22.14 kg/m   Wt Readings from Last 4 Encounters:  01/04/17 124 lb (56.2 kg)  12/30/16 125 lb (56.7 kg)  12/09/16 127 lb (57.6 kg)  11/07/16 129 lb (58.5 kg)  Psych/Mental status: Alert, oriented x 3 (person, place, & time)       Eyes: PERLA Respiratory: No evidence of acute respiratory distress  Cervical Spine Area Exam  Skin & Axial Inspection: Paravertebral muscle atrophy Alignment: Asymmetric Functional ROM: Decreased ROM      Stability: No instability detected Muscle Tone/Strength: Functionally intact. No obvious neuro-muscular anomalies detected. Sensory (Neurological): Movement-associated pain Palpation: Complains of area being tender to palpation                     Upper Extremity (UE) Exam    Side: Right upper extremity  Side: Left upper extremity   Skin & Extremity Inspection: Positive color changes  Skin & Extremity Inspection: Positive color changes   Functional ROM: Unrestricted ROM          Functional ROM: Unrestricted ROM           Muscle  Tone/Strength: Mild-to-medarate deconditioning  Muscle Tone/Strength: Moderate-to-severe deconditioning   Sensory (Neurological): Movement-associated pain  Sensory (Neurological): Movement-associated pain   Palpation: No palpable anomalies              Palpation: No palpable anomalies               Specialized Test(s): Deferred         Specialized Test(s): Deferred           Thoracic Spine Area Exam  Skin & Axial Inspection: Significant thoracic kyphosis Alignment: Asymmetric Functional ROM: Decreased ROM Stability: No instability detected Muscle Tone/Strength: Functionally intact. No obvious neuro-muscular anomalies detected. Sensory (Neurological): Movement associated pain Muscle strength & Tone: Complains of area being tender to palpation overlying thoracic vertebra  Lumbar Spine Area Exam  Skin & Axial Inspection: Thoraco-lumbar Scoliosis Alignment: Asymmetric Functional ROM: Decreased ROM      Stability: No instability detected Muscle Tone/Strength: Functionally intact. No obvious neuro-muscular anomalies detected. Sensory (Neurological): Movement-associated pain Palpation: Tender       Provocative Tests: Lumbar Hyperextension and rotation test: Positive       Lumbar Lateral bending test: Positive       Patrick's Maneuver: evaluation deferred today  Gait & Posture Assessment  Ambulation: Patient ambulates using a walker Gait: Age-related, senile gait pattern Posture: Thoracic kyphosis   Lower Extremity Exam    Side: Right lower extremity  Side: Left lower extremity  Skin & Extremity Inspection: Positive color changes, vascular changes noted, mild venous stasis edema   Skin & Extremity Inspection: Positive color changes  Functional ROM: Decreased ROM          Functional ROM: Unrestricted ROM          Muscle Tone/Strength: Functionally intact. No obvious neuro-muscular anomalies detected.  Muscle Tone/Strength: Functionally intact. No obvious  neuro-muscular anomalies detected.  Sensory (Neurological): Movement-associated pain  Sensory (Neurological): Unimpaired  Palpation: Complains of area being tender to palpation      Assessment & Plan  Primary Diagnosis & Pertinent Problem List: The primary encounter diagnosis was DDD (degenerative disc disease), lumbar. Diagnoses of Facet syndrome, lumbar (HCC), Non-traumatic compression fracture of twelfth thoracic vertebra, initial encounter [facility]), Other idiopathic scoliosis, thoracolumbar region, Lumbar radiculopathy, Chronic atrial fibrillation (HCC), Compression fracture of thoracic spine, non-traumatic, initial encounter [facility]), Sacroiliac joint dysfunction, Chronic pain syndrome, and Spinal stenosis, lumbar region, with neurogenic claudication were also pertinent to this visit.  Visit Diagnosis: 1. DDD (degenerative disc disease), lumbar   2. Facet syndrome, lumbar (HCC)   3. Non-traumatic compression fracture of twelfth thoracic vertebra, initial encounter (HCC)   4. Other idiopathic scoliosis, thoracolumbar region   5. Lumbar radiculopathy   6. Chronic atrial fibrillation (HCC)   7. Compression fracture of thoracic spine, non-traumatic, initial encounter (HCC)   8. Sacroiliac joint dysfunction   9. Chronic pain syndrome   10. Spinal stenosis, lumbar region, with neurogenic claudication     Patient is a very pleasant 83 year old female with a complex and extensive past medical history including cardiac history significant for atrial fibrillation, cardiomyopathy, peripheral vascular disease who is referred for evaluation of low back pain that is present on both sides with radiation to her right knee in a posterior dermatomal distribution involving the S1-S2 dermatomes primarily. Patient's low back pain which radiates to the right leg is multifactorial secondary to severe scoliosis, severe degenerative lumbar spondylosis with multilevel facetogenic disease, compression fractures of T12  and L4, and moderate spinal canal stenosis and lateral recess stenosis at L2-L3 and at L4-L5. Patient does have symptoms of spinal stenosis with neurogenic claudication.  In terms of her medication regimen, patient takes gabapentin 300 mg daily at bedtime along with oxycodone 5 mg 2-3 times a day as needed for severe pain.  Patient is low risk for abuse/misuse of chronic opioid therapy.  Plan: 1. Opioid agreement with our clinic 2. Prescription for Percocet 5 mg up to twice daily as needed for severe pain, quantity 60. Prescription for 1 month. 3. Continue gabapentin 300 mg daily at bedtime, pt has Rx  Plan of Care  Pharmacotherapy (Medications Ordered): Meds ordered this encounter  Medications  . oxyCODONE-acetaminophen (PERCOCET/ROXICET) 5-325 MG tablet    Sig: Take 1 tablet by mouth 2 (two) times daily as needed for severe pain.    Dispense:  60 tablet    Refill:  0    To last for 30 days from fill date   Lab-work, procedure(s), and/or referral(s): No orders of the defined types were placed in this encounter.   Considering:   -Bilateral genicular nerve blocks -Lumbar medial branch nerve blocks -Left posterior intercostal nerve blocks    PRN Procedures:   To be determined at a later time  Provider-requested follow-up: Return in about 4 weeks (around 02/01/2017) for Medication Management.  Future Appointments Date Time Provider [place]  01/10/2017 9:30 AM [facility] None  01/10/2017 10:30 AM Schnier, Dolores Lory, MD [facility] None  01/27/2017 10:30 AM Gillis Santa, MD [facility] None    Primary Care Physician: Marinda Elk, MD Location: [facility] Note by: Gillis Santa, M.D Date: 01/04/2017; Time: 2:00 PM  Patient Instructions  You were given one prescription for Percocet today.

## 2017-01-04 NOTE — Patient Instructions (Addendum)
You were given one prescription for Percocet today. Continue Gabapentin. Follow up in 3-4 weeks.

## 2017-01-10 ENCOUNTER — Ambulatory Visit (INDEPENDENT_AMBULATORY_CARE_PROVIDER_SITE_OTHER): Payer: PPO | Admitting: Vascular Surgery

## 2017-01-10 ENCOUNTER — Encounter (INDEPENDENT_AMBULATORY_CARE_PROVIDER_SITE_OTHER): Payer: PPO

## 2017-01-12 DIAGNOSIS — M75101 Unspecified rotator cuff tear or rupture of right shoulder, not specified as traumatic: Secondary | ICD-10-CM | POA: Diagnosis not present

## 2017-01-12 DIAGNOSIS — M12811 Other specific arthropathies, not elsewhere classified, right shoulder: Secondary | ICD-10-CM | POA: Diagnosis not present

## 2017-01-12 DIAGNOSIS — M25511 Pain in right shoulder: Secondary | ICD-10-CM | POA: Diagnosis not present

## 2017-01-13 DIAGNOSIS — I495 Sick sinus syndrome: Secondary | ICD-10-CM | POA: Diagnosis not present

## 2017-01-13 DIAGNOSIS — I429 Cardiomyopathy, unspecified: Secondary | ICD-10-CM | POA: Diagnosis not present

## 2017-01-13 DIAGNOSIS — I739 Peripheral vascular disease, unspecified: Secondary | ICD-10-CM | POA: Diagnosis not present

## 2017-01-13 DIAGNOSIS — I10 Essential (primary) hypertension: Secondary | ICD-10-CM | POA: Diagnosis not present

## 2017-01-13 DIAGNOSIS — I4891 Unspecified atrial fibrillation: Secondary | ICD-10-CM | POA: Diagnosis not present

## 2017-01-13 DIAGNOSIS — I872 Venous insufficiency (chronic) (peripheral): Secondary | ICD-10-CM | POA: Diagnosis not present

## 2017-01-17 ENCOUNTER — Other Ambulatory Visit (INDEPENDENT_AMBULATORY_CARE_PROVIDER_SITE_OTHER): Payer: Self-pay | Admitting: Vascular Surgery

## 2017-01-17 DIAGNOSIS — I779 Disorder of arteries and arterioles, unspecified: Secondary | ICD-10-CM

## 2017-01-17 DIAGNOSIS — I739 Peripheral vascular disease, unspecified: Principal | ICD-10-CM

## 2017-01-19 ENCOUNTER — Ambulatory Visit (INDEPENDENT_AMBULATORY_CARE_PROVIDER_SITE_OTHER): Payer: PPO

## 2017-01-19 DIAGNOSIS — I779 Disorder of arteries and arterioles, unspecified: Secondary | ICD-10-CM

## 2017-01-19 DIAGNOSIS — I739 Peripheral vascular disease, unspecified: Principal | ICD-10-CM

## 2017-01-19 LAB — VAS US CAROTID
LCCADSYS: 55 cm/s
LEFT ECA DIAS: -15 cm/s
LEFT VERTEBRAL DIAS: 14 cm/s
LICADSYS: -82 cm/s
LICAPDIAS: -60 cm/s
Left CCA dist dias: 21 cm/s
Left CCA prox dias: 16 cm/s
Left CCA prox sys: 69 cm/s
Left ICA dist dias: -28 cm/s
Left ICA prox sys: -176 cm/s
RCCAPDIAS: 10 cm/s
RIGHT CCA MID DIAS: 12 cm/s
RIGHT ECA DIAS: -6 cm/s
RIGHT VERTEBRAL DIAS: 20 cm/s
Right CCA prox sys: 52 cm/s
Right cca dist sys: -87 cm/s

## 2017-01-20 DIAGNOSIS — R1031 Right lower quadrant pain: Secondary | ICD-10-CM | POA: Diagnosis not present

## 2017-01-27 ENCOUNTER — Encounter: Payer: Self-pay | Admitting: Student in an Organized Health Care Education/Training Program

## 2017-01-27 ENCOUNTER — Ambulatory Visit
Payer: PPO | Attending: Student in an Organized Health Care Education/Training Program | Admitting: Student in an Organized Health Care Education/Training Program

## 2017-01-27 ENCOUNTER — Encounter (INDEPENDENT_AMBULATORY_CARE_PROVIDER_SITE_OTHER): Payer: Self-pay | Admitting: Vascular Surgery

## 2017-01-27 VITALS — BP 115/70 | HR 70 | Temp 97.9°F | Resp 18 | Ht 64.0 in | Wt 123.0 lb

## 2017-01-27 DIAGNOSIS — Z5181 Encounter for therapeutic drug level monitoring: Secondary | ICD-10-CM | POA: Insufficient documentation

## 2017-01-27 DIAGNOSIS — Z881 Allergy status to other antibiotic agents status: Secondary | ICD-10-CM | POA: Diagnosis not present

## 2017-01-27 DIAGNOSIS — M5116 Intervertebral disc disorders with radiculopathy, lumbar region: Secondary | ICD-10-CM | POA: Diagnosis not present

## 2017-01-27 DIAGNOSIS — Z950 Presence of cardiac pacemaker: Secondary | ICD-10-CM | POA: Insufficient documentation

## 2017-01-27 DIAGNOSIS — M4125 Other idiopathic scoliosis, thoracolumbar region: Secondary | ICD-10-CM | POA: Diagnosis not present

## 2017-01-27 DIAGNOSIS — F329 Major depressive disorder, single episode, unspecified: Secondary | ICD-10-CM | POA: Diagnosis not present

## 2017-01-27 DIAGNOSIS — Z8249 Family history of ischemic heart disease and other diseases of the circulatory system: Secondary | ICD-10-CM | POA: Diagnosis not present

## 2017-01-27 DIAGNOSIS — Z823 Family history of stroke: Secondary | ICD-10-CM | POA: Insufficient documentation

## 2017-01-27 DIAGNOSIS — I252 Old myocardial infarction: Secondary | ICD-10-CM | POA: Diagnosis not present

## 2017-01-27 DIAGNOSIS — M5416 Radiculopathy, lumbar region: Secondary | ICD-10-CM | POA: Diagnosis not present

## 2017-01-27 DIAGNOSIS — M47816 Spondylosis without myelopathy or radiculopathy, lumbar region: Secondary | ICD-10-CM | POA: Diagnosis not present

## 2017-01-27 DIAGNOSIS — M5136 Other intervertebral disc degeneration, lumbar region: Secondary | ICD-10-CM

## 2017-01-27 DIAGNOSIS — M533 Sacrococcygeal disorders, not elsewhere classified: Secondary | ICD-10-CM | POA: Diagnosis not present

## 2017-01-27 DIAGNOSIS — I429 Cardiomyopathy, unspecified: Secondary | ICD-10-CM | POA: Insufficient documentation

## 2017-01-27 DIAGNOSIS — I509 Heart failure, unspecified: Secondary | ICD-10-CM | POA: Insufficient documentation

## 2017-01-27 DIAGNOSIS — I70219 Atherosclerosis of native arteries of extremities with intermittent claudication, unspecified extremity: Secondary | ICD-10-CM | POA: Diagnosis not present

## 2017-01-27 DIAGNOSIS — M25551 Pain in right hip: Secondary | ICD-10-CM | POA: Insufficient documentation

## 2017-01-27 DIAGNOSIS — D62 Acute posthemorrhagic anemia: Secondary | ICD-10-CM | POA: Diagnosis not present

## 2017-01-27 DIAGNOSIS — M4854XA Collapsed vertebra, not elsewhere classified, thoracic region, initial encounter for fracture: Secondary | ICD-10-CM | POA: Diagnosis not present

## 2017-01-27 NOTE — Progress Notes (Signed)
Nursing Pain Medication Assessment:  Safety precautions to be maintained throughout the outpatient stay will include: orient to surroundings, keep bed in low position, maintain call bell within reach at all times, provide assistance with transfer out of bed and ambulation.  Medication Inspection Compliance: Pill count conducted under aseptic conditions, in front of the patient. Neither the pills nor the bottle was removed from the patient's sight at any time. Once count was completed pills were immediately returned to the patient in their original bottle.  Medication: Oxycodone IR Pill/Patch Count: 37 of 60 pills remain Pill/Patch Appearance: Markings consistent with prescribed medication Bottle Appearance: Standard pharmacy container. Clearly labeled. Filled Date: 09 / 12 / 2018 Last Medication intake:  Yesterday

## 2017-01-27 NOTE — Progress Notes (Signed)
Patient's Name: Joanna Roberts  MRN: [id number]  Referring Provider: Marinda Elk, MD  DOB: 1929/02/10  PCP: Marinda Elk, MD  DOS: 01/27/2017  Note by: Gillis Santa, MD  Service setting: Ambulatory outpatient  Specialty: Interventional Pain Management  Location: [facility] (AMB) Pain Management Facility    Patient type: Established   Primary Reason(s) for Visit: Encounter for prescription drug management. (Level of risk: moderate)  CC: Hip Pain (right)  HPI  Joanna Roberts is a 83 y.o. year old, female patient, who comes today for a medication management evaluation. She has Pneumonia; A-fib ([place]); Acquired hypothyroidism; Atrial fibrillation ([place]); Cardiomyopathy ([place]); Chronic abdominal pain; Chronic anemia; Back pain, chronic; CN (constipation); DD (diverticular disease); Fatty infiltration of liver; Aggrieved; Bergmann's syndrome; History of colon polyps; Personal history of urinary infection; History of urinary anomaly; Incomplete bladder emptying; Cannot sleep; Frequent UTI; Esophagitis, reflux; Pain in rib; Tachycardia-bradycardia ([place]); Temporary cerebral vascular dysfunction; FOM (frequency of micturition); B12 deficiency; DDD (degenerative disc disease), lumbar; Facet syndrome, lumbar; Sacroiliac joint dysfunction; Spinal stenosis, lumbar region, with neurogenic claudication; Lumbar radiculopathy; Compression fracture of thoracic spine, non-traumatic ([place]); Idiopathic scoliosis; H/O varicose vein stripping; Chronic venous insufficiency; Chronic mesenteric ischemia ([place]); Hematoma of leg, right, initial encounter; Acute blood loss anemia; Sepsis ([place]); Protein-calorie malnutrition, severe; Lymphedema; Varicose veins of both lower extremities; and Atherosclerosis of native arteries of extremity with intermittent claudication (HCC) on her problem list. Her primarily concern today is the Hip Pain (right)  Pain Assessment: Location: Right Hip Radiating:   Onset: More than a month ago Duration:  Chronic pain Quality: Aching, Sharp Severity: 5 /10 (self-reported pain score)  Note: Reported level is compatible with observation.                   When using our objective Pain Scale, levels between 6 and 10/10 are said to belong in an emergency room, as it progressively worsens from a 6/10, described as severely limiting, requiring emergency care not usually available at an outpatient pain management facility. At a 6/10 level, communication becomes difficult and requires great effort. Assistance to reach the emergency department may be required. Facial flushing and profuse sweating along with potentially dangerous increases in heart rate and blood pressure will be evident. Effect on ADL:   Timing: Constant Modifying factors: medications, heat, rest  Joanna Roberts was last scheduled for an appointment on 01/04/2017 for medication management. During today's appointment we reviewed Joanna Roberts's chronic pain status, as well as her outpatient medication regimen.  The patient  Roberts that she does not use drugs. Her body mass index is 21.11 kg/m.  Further details on both, my assessment(s), as well as the proposed treatment plan, please see below.  Controlled Substance Pharmacotherapy Assessment REMS (Risk Evaluation and Mitigation Strategy)  Analgesic: Percocet 5 mg twice a day when necessary, quantity 60 a month MME/day: approximately 10 mg/day.  Hart Rochester, RN  01/27/2017 10:27 AM  Sign at close encounter Nursing Pain Medication Assessment:  Safety precautions to be maintained throughout the outpatient stay will include: orient to surroundings, keep bed in low position, maintain call bell within reach at all times, provide assistance with transfer out of bed and ambulation.  Medication Inspection Compliance: Pill count conducted under aseptic conditions, in front of the patient. Neither the pills nor the bottle was removed from the patient's sight at any time. Once count was completed pills  were immediately returned to the patient in their original bottle.  Medication: Oxycodone IR Pill/Patch  Count: 37 of 60 pills remain Pill/Patch Appearance: Markings consistent with prescribed medication Bottle Appearance: Standard pharmacy container. Clearly labeled. Filled Date: 09 / 12 / 2018 Last Medication intake:  Yesterday   Pharmacokinetics: Liberation and absorption (onset of action): WNL Distribution (time to peak effect): WNL Metabolism and excretion (duration of action): WNL         Pharmacodynamics: Desired effects: Analgesia: Joanna Roberts >50% benefit. Functional ability: Patient Roberts that medication allows her to accomplish basic ADLs Clinically meaningful improvement in function (CMIF): Sustained CMIF goals met Perceived effectiveness: Described as relatively effective, allowing for increase in activities of daily living (ADL) Undesirable effects: Side-effects or Adverse reactions: None reported Monitoring: [place] PMP: Online review of the past 88-monthperiod conducted. Compliant with practice rules and regulations Last UDS on record: Summary  Date Value Ref Range Status  12/09/2016 FINAL  Final    Comment:    ==================================================================== TOXASSURE COMP DRUG ANALYSIS,UR ==================================================================== Test                             Result       Flag       Units Drug Present and Declared for Prescription Verification   Oxazepam                       551          EXPECTED   ng/mg creat   Temazepam                      >2105        EXPECTED   ng/mg creat    Oxazepam and temazepam are expected metabolites of diazepam.    Oxazepam is also an expected metabolite of other benzodiazepine    drugs, including chlordiazepoxide, prazepam, clorazepate,    halazepam, and temazepam.  Oxazepam and temazepam are available    as scheduled prescription medications.   Oxycodone                       1667         EXPECTED   ng/mg creat   Oxymorphone                    697          EXPECTED   ng/mg creat   Noroxycodone                   1253         EXPECTED   ng/mg creat   Noroxymorphone                 118          EXPECTED   ng/mg creat    Sources of oxycodone are scheduled prescription medications.    Oxymorphone, noroxycodone, and noroxymorphone are expected    metabolites of oxycodone. Oxymorphone is also available as a    scheduled prescription medication.   Gabapentin                     PRESENT      EXPECTED   Acetaminophen                  PRESENT      EXPECTED Drug Absent but Declared for Prescription Verification   Hydrocodone  Not Detected UNEXPECTED ng/mg creat   Diclofenac                     Not Detected UNEXPECTED    Diclofenac, as indicated in the declared medication list, is not    always detected even when used as directed. ==================================================================== Test                      Result    Flag   Units      Ref Range   Creatinine              95               mg/dL      >=20 ==================================================================== Declared Medications:  The flagging and interpretation on this report are based on the  following declared medications.  Unexpected results may arise from  inaccuracies in the declared medications.  **Note: The testing scope of this panel includes these medications:  Gabapentin  Hydrocodone (Hydrocodone-Acetaminophen)  Oxycodone  Oxycodone (Oxycodone Acetaminophen)  Temazepam  **Note: The testing scope of this panel does not include small to  moderate amounts of these reported medications:  Acetaminophen (Hydrocodone-Acetaminophen)  Acetaminophen (Oxycodone Acetaminophen)  Diclofenac  **Note: The testing scope of this panel does not include following  reported medications:  Amoxicillin (Augmentin)  Atropine (Lomotil)  Budesonide  Carvedilol  Ciprofloxacin   Clavulinate (Augmentin)  Cyanocobalamin  Diphenoxylate (Lomotil)  Docusate (Colace)  Ferrous Gluconate  Furosemide  Levothyroxine  Loperamide  Losartan (Losartan Potassium)  Metronidazole  Nitroglycerin  Ondansetron  Prednisone  Silver  Sulfamethoxazole (Trimethoprim-Sulfamethoxazole)  Trimethoprim (Trimethoprim-Sulfamethoxazole) ==================================================================== For clinical consultation, please call [phone]. ====================================================================    UDS interpretation: Compliant          Medication Assessment Form: Reviewed. Patient indicates being compliant with therapy Treatment compliance: Compliant Risk Assessment Profile: Aberrant behavior: See prior evaluations. None observed or detected today Comorbid factors increasing risk of overdose: See prior notes. No additional risks detected today Risk of substance use disorder (SUD): Low     Opioid Risk Tool - 12/09/16 0853      Family History of Substance Abuse   Alcohol Negative   Illegal Drugs Negative   Rx Drugs Negative     Personal History of Substance Abuse   Alcohol Negative   Illegal Drugs Negative   Rx Drugs Negative     Age   Age between 42-45 years  No     History of Preadolescent Sexual Abuse   History of Preadolescent Sexual Abuse Negative or Female     Psychological Disease   Psychological Disease Negative   Depression Negative     Total Score   Opioid Risk Tool Scoring 0   Opioid Risk Interpretation Low Risk     ORT Scoring interpretation table:  Score <3 = Low Risk for SUD  Score between 4-7 = Moderate Risk for SUD  Score >8 = High Risk for Opioid Abuse   Risk Mitigation Strategies:  Patient Counseling: Covered Patient-Prescriber Agreement (PPA): Present and active  Notification to other healthcare providers: Done  Pharmacologic Plan: No change in therapy, at this time  Laboratory Chemistry  Inflammation  Markers (CRP: Acute Phase) (ESR: Chronic Phase) No results found for: CRP, ESRSEDRATE               Renal Function Markers Lab Results  Component Value Date   BUN 32 (H) 08/29/2016   CREATININE 1.01 (H) 08/29/2016   GFRAA  57 (L) 08/29/2016   GFRNONAA 49 (L) 08/29/2016                 Hepatic Function Markers Lab Results  Component Value Date   AST 28 06/02/2016   ALT 15 06/02/2016   ALBUMIN 2.7 (L) 06/02/2016   ALKPHOS 30 (L) 06/02/2016                 Electrolytes Lab Results  Component Value Date   NA 136 08/29/2016   K 4.8 08/29/2016   CL 106 08/29/2016   CALCIUM 8.2 (L) 08/29/2016   MG 1.7 03/09/2016                 Neuropathy Markers No results found for: [id number]               Bone Pathology Markers Lab Results  Component Value Date   ALKPHOS 30 (L) 06/02/2016   CALCIUM 8.2 (L) 08/29/2016                 Coagulation Parameters Lab Results  Component Value Date   INR 1.23 08/29/2016   LABPROT 15.6 (H) 08/29/2016   APTT 38 (H) 08/27/2016   PLT 120 (L) 08/29/2016                 Cardiovascular Markers Lab Results  Component Value Date   HGB 9.3 (L) 08/29/2016   HCT 22.0 (L) 08/29/2016                 Note: Lab results reviewed.  Recent Diagnostic Imaging Results  CT ANGIO CHEST PE W OR WO CONTRAST CLINICAL DATA:  83 year old female with a history of shortness of breath and chest pain  EXAM: CT ANGIOGRAPHY CHEST WITH CONTRAST  TECHNIQUE: Multidetector CT imaging of the chest was performed using the standard protocol during bolus administration of intravenous contrast. Multiplanar CT image reconstructions and MIPs were obtained to evaluate the vascular anatomy.  CONTRAST:  75 cc Isovue 370  COMPARISON:  Chest x-ray 08/27/2016, CT 03/31/2016  FINDINGS: Cardiovascular:  Heart:  Heart is enlarged, compatible with findings on prior chest x-ray imaging. Trace pericardial fluid/ thickening at the posterior aspect. Cardiac pacer/AICD on  the left chest wall with leads terminating in the coronary sinus, and right ventricle. Calcifications of left main, left anterior descending, circumflex coronary arteries.  Aorta:  Greatest diameter of the ascending aorta measures 3.9 cm. Calcifications of the aortic arch and descending thoracic aorta.  Pulmonary arteries:  No central, lobar, segmental, or proximal subsegmental filling defects.  Mediastinum/Nodes: Mediastinal lymph nodes are present, none of which are enlarged by CT size criteria. Unremarkable appearance of the thoracic esophagus.  Unremarkable appearance of the thoracic inlet  Lungs/Pleura: Centrilobular distribution of small nodules of the right upper lobe posteriorly with minimal ground-glass attenuation of the adjacent parenchyma. More mild nodularity of the right middle lobe and anteriorly at the right lower lobe, as well as in the right costophrenic sulcus.  On the left there are changes of more mild peripheral nodularity of posterior left lower lobe, within the inferior lingula, and within the costophrenic sulcus.  No pleural effusion.  Upper Abdomen: Unremarkable.  Musculoskeletal: Similar appearance of T12 compression fracture, present on the comparison imaging. Multilevel degenerative changes of the thoracic spine.  Review of the MIP images confirms the above findings.  IMPRESSION: Negative for pulmonary emboli.  Pattern of centrilobular nodularity of predominantly right upper lobe, though also right middle lobe right lower lobe, as well  as within the left lung. Findings are most likely inflammatory/infectious, however, a follow-up chest CT is warranted after the patient has been treated to assure resolution and rule out malignancy.  Cardiomegaly with left main and 3 vessel coronary artery disease.  Trace pericardial fluid/thickening.  Aortic Atherosclerosis (ICD10-I70.0).  Electronically Signed   By: Gilmer Mor D.O.   On:  11/17/2016 15:12  Complexity Note: Imaging results reviewed. Results shared with Joanna Roberts, using Layman's terms.                         Meds   Current Outpatient Prescriptions:  .  budesonide (ENTOCORT EC) 3 MG 24 hr capsule, Take 3 mg by mouth daily. , Disp: , Rfl:  .  carvedilol (COREG) 25 MG tablet, TAKE ONE TABLET BY MOUTH TWICE DAILY, Disp: , Rfl:  .  diclofenac sodium (VOLTAREN) 1 % GEL, Apply 4 g topically 4 (four) times daily., Disp: 100 g, Rfl: 2 .  diphenoxylate-atropine (LOMOTIL) 2.5-0.025 MG tablet, Take 1-2 tablets by mouth 4 (four) times daily as needed for diarrhea or loose stools., Disp: 40 tablet, Rfl: 0 .  docusate sodium (COLACE) 100 MG capsule, Take 1 capsule (100 mg total) by mouth 2 (two) times daily as needed (take to keep stool soft.)., Disp: 60 capsule, Rfl: 0 .  ferrous sulfate 325 (65 FE) MG tablet, Take 1 tablet (325 mg total) by mouth daily with breakfast., Disp: 30 tablet, Rfl: 0 .  furosemide (LASIX) 20 MG tablet, Take 1 tablet (20 mg total) by mouth daily as needed for fluid., Disp: , Rfl:  .  gabapentin (NEURONTIN) 300 MG capsule, TAKE 1 CAPSULE BY MOUTH ONCE DAILY, Disp: , Rfl:  .  levothyroxine (SYNTHROID, LEVOTHROID) 25 MCG tablet, TAKE 1 TABLET EVERY DAY ON EMPTY STOMACHWITH A GLASS OF WATER AT LEAST 30-60 MINBEFORE BREAKFAST, Disp: , Rfl:  .  loperamide (ANTI-DIARRHEAL) 2 MG capsule, Take 2 mg by mouth as needed for diarrhea or loose stools., Disp: , Rfl:  .  losartan (COZAAR) 25 MG tablet, TAKE ONE TABLET EVERY DAY, Disp: , Rfl:  .  mirtazapine (REMERON) 7.5 MG tablet, , Disp: , Rfl:  .  nitroGLYCERIN (NITROSTAT) 0.4 MG SL tablet, Place under the tongue., Disp: , Rfl:  .  ondansetron (ZOFRAN) 4 MG tablet, Take 4 mg by mouth every 8 (eight) hours as needed. , Disp: , Rfl:  .  oxyCODONE-acetaminophen (PERCOCET/ROXICET) 5-325 MG tablet, Take 1 tablet by mouth 2 (two) times daily as needed for severe pain., Disp: 60 tablet, Rfl: 0 .  predniSONE (DELTASONE) 5  MG tablet, , Disp: , Rfl:  .  silver sulfADIAZINE (SILVADENE) 1 % cream, Apply 1 application topically 2 (two) times daily. Apply to area twice a day, Disp: 25 g, Rfl: 2 .  temazepam (RESTORIL) 7.5 MG capsule, Take by mouth., Disp: , Rfl:  .  vitamin B-12 (CYANOCOBALAMIN) 1000 MCG tablet, Take 1,000 mcg by mouth daily., Disp: , Rfl:   Current Facility-Administered Medications:  .  mirabegron ER (MYRBETRIQ) tablet 25 mg, 25 mg, Oral, Daily, Hildred Laser, MD  ROS  Constitutional: Denies any fever or chills Gastrointestinal: No reported hemesis, hematochezia, vomiting, or acute GI distress Musculoskeletal: Denies any acute onset joint swelling, redness, loss of ROM, or weakness Neurological: No reported episodes of acute onset apraxia, aphasia, dysarthria, agnosia, amnesia, paralysis, loss of coordination, or loss of consciousness  Allergies  Joanna Roberts is allergic to lortab [hydrocodone-acetaminophen]; doxycycline; macrobid [nitrofurantoin monohyd  macro]; and nitrofurantoin.  PFSH  Drug: Joanna Roberts  Roberts that she does not use drugs. Alcohol:  Roberts that she does not drink alcohol. Tobacco:  Roberts that she has never smoked. She has never used smokeless tobacco. Medical:  has a past medical history of AICD (automatic cardioverter/defibrillator) present; Allergy; Anemia; Arthritis; Cataract; CHF (congestive heart failure) ([place]); Depression; Dysrhythmia; GERD (gastroesophageal reflux disease); Heart murmur; Hypertension; Myocardial infarction ([place]); Osteoporosis; Presence of permanent cardiac pacemaker; and Thyroid disease. Surgical: Joanna Roberts  has a past surgical history that includes Hernia repair; Insert / replace / remove pacemaker; Appendectomy; Abdominal hysterectomy; Cardiac catheterization (Right, 10/08/2014); Cardiac catheterization (10/08/2014); and Cardiac catheterization (N/A, 03/23/2016). Family: family history includes Arthritis in her father; Asthma in her father; Early death in her  mother; Heart disease in her mother; Stroke in her sister.  Constitutional Exam  General appearance: Well nourished, well developed, and well hydrated. In no apparent acute distress Vitals:   01/27/17 1027  BP: 115/70  Pulse: 70  Resp: 18  Temp: 97.9 F (36.6 C)  TempSrc: Oral  SpO2: 100%  Weight: 123 lb (55.8 kg)  Height: '5\' 4"'$  (1.626 m)   BMI Assessment: Estimated body mass index is 21.11 kg/m as calculated from the following:   Height as of this encounter: '5\' 4"'$  (1.626 m).   Weight as of this encounter: 123 lb (55.8 kg).  BMI interpretation table: BMI level Category Range association with higher incidence of chronic pain  <18 kg/m2 Underweight   18.5-24.9 kg/m2 Ideal body weight   25-29.9 kg/m2 Overweight Increased incidence by 20%  30-34.9 kg/m2 Obese (Class I) Increased incidence by 68%  35-39.9 kg/m2 Severe obesity (Class II) Increased incidence by 136%  >40 kg/m2 Extreme obesity (Class III) Increased incidence by 254%   BMI Readings from Last 4 Encounters:  01/27/17 21.11 kg/m  01/04/17 21.28 kg/m  12/30/16 24.41 kg/m  12/09/16 21.80 kg/m   Wt Readings from Last 4 Encounters:  01/27/17 123 lb (55.8 kg)  01/04/17 124 lb (56.2 kg)  12/30/16 125 lb (56.7 kg)  12/09/16 127 lb (57.6 kg)  Psych/Mental status: Alert, oriented x 3 (person, place, & time)       Eyes: PERLA Respiratory: No evidence of acute respiratory distress  Cervical Spine Area Exam  Skin & Axial Inspection:Paravertebral muscle atrophy Alignment:Asymmetric Functional QVZ:DGLOVFIEP ROM Stability:No instability detected Muscle Tone/Strength:Functionally intact. No obvious neuro-muscular anomalies detected. Sensory (Neurological):Movement-associated pain Palpation:Complains of area being tender to palpation         Upper Extremity (UE) Exam    Side:Right upper extremity  Side:Left upper extremity   Skin & Extremity Inspection:Positive color changes   Skin & Extremity Inspection:Positive color changes   Functional PIR:[id number] ROM  Functional ZSW:[id number] ROM   Muscle Tone/Strength:Mild-to-medarate deconditioning  Muscle Tone/Strength:Moderate-to-severe deconditioning   Sensory (Neurological):Movement-associated pain  Sensory (Neurological):Movement-associated pain   Palpation:No palpable anomalies  Palpation:No palpable anomalies   Specialized Test(s):Deferred  Specialized Test(s):Deferred    Thoracic Spine Area Exam  Skin & Axial Inspection:Significant thoracic kyphosis Alignment:Asymmetric Functional GUR:KYHCWCBJS ROM Stability:No instability detected Muscle Tone/Strength:Functionally intact. No obvious neuro-muscular anomalies detected. Sensory (Neurological):Movement associated pain Muscle strength & Tone:Complains of area being tender to palpationoverlying thoracic vertebra  Lumbar Spine Area Exam  Skin & Axial Inspection:Thoraco-lumbar Scoliosis Alignment:Asymmetric Functional EGB:TDVVOHYWV ROM Stability:No instability detected Muscle Tone/Strength:Functionally intact. No obvious neuro-muscular anomalies detected. Sensory (Neurological):Movement-associated pain Palpation:Tender Provocative Tests: Lumbar Hyperextension and rotation test:Positive Lumbar Lateral bending test:Positive Patrick's Maneuver:evaluation deferred today  Gait & Posture Assessment  Ambulation:Patient ambulates using a walker Gait:Age-related, senile gait pattern Posture:Thoracic kyphosis  Lower Extremity Exam    Side:Right lower extremity  Side:Left lower extremity  Skin & Extremity Inspection:Positive color changes, vascular changes noted, mild venous stasis edema   Skin & Extremity Inspection:Positive color changes  Functional ZOX:WRUEAVWUJ ROM  Functional WJX:[id number]  ROM  Muscle Tone/Strength:Functionally intact. No obvious neuro-muscular anomalies detected.  Muscle Tone/Strength:Functionally intact. No obvious neuro-muscular anomalies detected.  Sensory (Neurological):Movement-associated pain  Sensory (Neurological):Unimpaired  Palpation:Complains of area being tender to palpation     Palpation: No palpable anomalies  Palpation: No palpable anomalies   Assessment  Primary Diagnosis & Pertinent Problem List: The primary encounter diagnosis was DDD (degenerative disc disease), lumbar. Diagnoses of Facet syndrome, lumbar, Non-traumatic compression fracture of twelfth thoracic vertebra, initial encounter [facility]), Other idiopathic scoliosis, thoracolumbar region, Lumbar radiculopathy, Compression fracture of thoracic spine, non-traumatic, initial encounter ([place]), and Sacroiliac joint dysfunction were also pertinent to this visit.  Status Diagnosis  Controlled Controlled Controlled 1. DDD (degenerative disc disease), lumbar   2. Facet syndrome, lumbar   3. Non-traumatic compression fracture of twelfth thoracic vertebra, initial encounter ([place])   4. Other idiopathic scoliosis, thoracolumbar region   5. Lumbar radiculopathy   6. Compression fracture of thoracic spine, non-traumatic, initial encounter ([place])   7. Sacroiliac joint dysfunction       Patient is a very pleasant 83 year old female with a complex and extensive past medical history including cardiac history significant for atrial fibrillation, cardiomyopathy, peripheral vascular disease who is referred for evaluation of low back pain that is present on both sides with radiation to her right knee in a posterior dermatomal distribution involving the S1-S2 dermatomes primarily. Patient's low back pain which radiates to the right leg is multifactorial secondary to severe scoliosis, severe degenerative lumbar spondylosis with multilevel facetogenic disease, compression fractures of T12 and L4,  and moderate spinal canal stenosis and lateral recess stenosis at L2-L3 and at L4-L5. Patient does have symptoms of spinal stenosis with neurogenic claudication.In terms of her medication regimen, patient takes gabapentin 300 mg daily at bedtime along with oxycodone 5 mg 2-3 times a day as needed for severe pain.  Patient is low risk for abuse/misuse of chronic opioid therapy.  Patient presents today for follow-up. She seems to be doing well. She is utilizing her medication appropriately and only as needed. No side effects. They help improve her quality of life and functionality. Patient was tearful today endorsing depressed mood and sad thoughts about her family members, specifically her children and husband who have all passed away. Patient has tried antidepressants in the past, multiple types, which did not help and made her nauseous. I offered to refer her to a therapist but the patient stated that she was okay. Patient feels safe. No SI or HI.  Plan: -Return in 3 weeks for medication management. -continue current medications as prescribed including Percocet 5 mg twice a day when necessary and gabapentin 300 mg daily at bedtime.  Time Note: Greater than 50% of the 25 minute(s) of face-to-face time spent with Joanna Roberts, was spent in counseling/coordination of care regarding: opioid therapy, mental health, chronic pain and depression, pain coping mechanisms, deep breathing exercises..  Considering:   -Bilateral genicular nerve blocks -Lumbar medial branch nerve blocks -Left posterior intercostal nerve blocks    PRN Procedures:   To be determined at a later time   Provider-requested follow-up: Return in about 3 weeks (around 02/17/2017) for Medication Management.  Future Appointments Date Time Provider  [place]  01/31/2017 8:45 AM Schnier, Dolores Lory, MD [facility] None  03/03/2017 10:00 AM Gillis Santa, MD [facility] None  07/18/2017 2:30 PM [facility] 1 [facility] None  07/18/2017 3:30  PM Schnier, Dolores Lory, MD [facility] None    Primary Care Physician: Marinda Elk, MD Location: [facility] Note by: Gillis Santa, M.D Date: 01/27/2017; Time: 11:31 AM  Patient Instructions  Follow up in 3 weeks

## 2017-01-27 NOTE — Patient Instructions (Signed)
Follow-up in 3 weeks

## 2017-01-31 ENCOUNTER — Encounter (INDEPENDENT_AMBULATORY_CARE_PROVIDER_SITE_OTHER): Payer: Self-pay

## 2017-01-31 ENCOUNTER — Other Ambulatory Visit (INDEPENDENT_AMBULATORY_CARE_PROVIDER_SITE_OTHER): Payer: Self-pay | Admitting: Vascular Surgery

## 2017-01-31 ENCOUNTER — Ambulatory Visit (INDEPENDENT_AMBULATORY_CARE_PROVIDER_SITE_OTHER): Payer: PPO | Admitting: Vascular Surgery

## 2017-01-31 DIAGNOSIS — I482 Chronic atrial fibrillation, unspecified: Secondary | ICD-10-CM

## 2017-01-31 DIAGNOSIS — I6523 Occlusion and stenosis of bilateral carotid arteries: Secondary | ICD-10-CM

## 2017-01-31 DIAGNOSIS — I7025 Atherosclerosis of native arteries of other extremities with ulceration: Secondary | ICD-10-CM

## 2017-01-31 DIAGNOSIS — I6529 Occlusion and stenosis of unspecified carotid artery: Secondary | ICD-10-CM | POA: Insufficient documentation

## 2017-01-31 DIAGNOSIS — L97309 Non-pressure chronic ulcer of unspecified ankle with unspecified severity: Secondary | ICD-10-CM | POA: Insufficient documentation

## 2017-01-31 DIAGNOSIS — L97322 Non-pressure chronic ulcer of left ankle with fat layer exposed: Secondary | ICD-10-CM | POA: Diagnosis not present

## 2017-01-31 DIAGNOSIS — I872 Venous insufficiency (chronic) (peripheral): Secondary | ICD-10-CM | POA: Diagnosis not present

## 2017-01-31 NOTE — Progress Notes (Signed)
MRN : [id number]  Joanna Roberts is a 83 y.o. (01/10/1929) female who presents with chief complaint of  Chief Complaint  Patient presents with  . Carotid    2 week follow up  .  History of Present Illness: The patient is seen for evaluation of painful lower extremities and diminished pulses associated with a new ulceration of the left foot.  The patient notes the ulcer has been present for multiple weeks and has not been improving.  It is very painful and has had some drainage.  No specific history of trauma noted by the patient.  The patient denies fever or chills.  the patient does have diabetes which has been difficult to control.  Patient notes prior to the ulcer developing the extremities were painful particularly with ambulation or activity and the discomfort is very consistent day today. Typically, the pain is constant and made worse with pressure.  She states the ulcer has been getting larger.  It began about one month ago.  She states she has been wearing compression and has been dressing it with Silvadene daily.  The pain has been progressive over the past several years.   The patient denies rest pain or dangling of an extremity off the side of the bed during the night for relief. No prior interventions or surgeries.  No history of back problems or DJD of the lumbar sacral spine.   The patient denies amaurosis fugax or recent TIA symptoms. There are no recent neurological changes noted. The patient denies history of DVT, PE or superficial thrombophlebitis. The patient denies recent episodes of angina or shortness of breath.   No outpatient prescriptions have been marked as taking for the 01/31/17 encounter (Office Visit) with Gilda Crease, Latina Craver, MD.   Current Facility-Administered Medications for the 01/31/17 encounter (Office Visit) with Gilda Crease, Latina Craver, MD  Medication  . mirabegron ER (MYRBETRIQ) tablet 25 mg    Past Medical History:  Diagnosis Date  . AICD (automatic  cardioverter/defibrillator) present   . Allergy   . Anemia   . Arthritis   . Cataract   . CHF (congestive heart failure) (HCC)   . Depression   . Dysrhythmia   . GERD (gastroesophageal reflux disease)   . Heart murmur   . Hypertension   . Myocardial infarction (HCC)   . Osteoporosis   . Presence of permanent cardiac pacemaker   . Thyroid disease     Past Surgical History:  Procedure Laterality Date  . ABDOMINAL HYSTERECTOMY    . APPENDECTOMY    . HERNIA REPAIR    . INSERT / REPLACE / REMOVE PACEMAKER    . PERIPHERAL VASCULAR CATHETERIZATION Right 10/08/2014   Procedure: Lower Extremity Angiography;  Surgeon: Renford Dills, MD;  Location: [facility];  Service: Cardiovascular;  Laterality: Right;  . PERIPHERAL VASCULAR CATHETERIZATION  10/08/2014   Procedure: Lower Extremity Intervention;  Surgeon: Renford Dills, MD;  Location: [facility];  Service: Cardiovascular;;  . PERIPHERAL VASCULAR CATHETERIZATION N/A 03/23/2016   Procedure: Visceral Angiography;  Surgeon: Renford Dills, MD;  Location: [facility];  Service: Cardiovascular;  Laterality: N/A;    Social History Social History  Substance Use Topics  . Smoking status: Never Smoker  . Smokeless tobacco: Never Used  . Alcohol use No    Family History Family History  Problem Relation Age of Onset  . Early death Mother   . Heart disease Mother   . Arthritis Father   .  Asthma Father   . Stroke Sister     Allergies  Allergen Reactions  . Lortab [Hydrocodone-Acetaminophen] Itching, Swelling and Rash  . Doxycycline Diarrhea  . Macrobid [Nitrofurantoin Monohyd Macro] Diarrhea and Nausea And Vomiting  . Nitrofurantoin Diarrhea, Nausea Only and Nausea And Vomiting     REVIEW OF SYSTEMS (Negative unless checked)  Constitutional: [] Weight loss  [] Fever  [] Chills Cardiac: [] Chest pain   [] Chest pressure   [] Palpitations   [] Shortness of breath when laying flat   [] Shortness of  breath with exertion. Vascular:  [x] Pain in legs with walking   [x] Pain in legs at rest  [] History of DVT   [] Phlebitis   [] Swelling in legs   [x] Varicose veins   [x] Non-healing ulcers Pulmonary:   [] Uses home oxygen   [] Productive cough   [] Hemoptysis   [] Wheeze  [] COPD   [] Asthma Neurologic:  [] Dizziness   [] Seizures   [] History of stroke   [] History of TIA  [] Aphasia   [] Vissual changes   [] Weakness or numbness in arm   [] Weakness or numbness in leg Musculoskeletal:   [] Joint swelling   [x] Joint pain   [x] Low back pain Hematologic:  [] Easy bruising  [] Easy bleeding   [] Hypercoagulable state   [] Anemic Gastrointestinal:  [] Diarrhea   [] Vomiting  [] Gastroesophageal reflux/heartburn   [] Difficulty swallowing. Genitourinary:  [] Chronic kidney disease   [] Difficult urination  [] Frequent urination   [] Blood in urine Skin:  [] Rashes   [] Ulcers  Psychological:  [] History of anxiety   []  History of major depression.  Physical Examination  There were no vitals filed for this visit. There is no height or weight on file to calculate BMI. Gen: WD/WN, NAD Head: [place]/AT, No temporalis wasting.  Ear/Nose/Throat: Hearing grossly intact, nares w/o erythema or drainage Eyes: PER, EOMI, sclera nonicteric.  Neck: Supple, no large masses.   Pulmonary:  Good air movement, no audible wheezing bilaterally, no use of accessory muscles.  Cardiac: RRR, no JVD Vascular:  varicosities present extensively greater than 4 mm bilaterally.  Moderate venous stasis changes to the legs bilaterally.  2+ soft pitting edema Vessel Right Left  Radial Palpable Palpable  PT Not Palpable Not Palpable  DP Not Palpable Not Palpable  Gastrointestinal: Non-distended. No guarding/no peritoneal signs.  Musculoskeletal: M/S 5/5 throughout.  No deformity or atrophy.  Neurologic: CN 2-12 intact. Symmetrical.  Speech is fluent. Motor exam as listed above. Psychiatric: Judgment intact, Mood & affect appropriate for pt's clinical  situation. Dermatologic: small bland ulcer on the left lateral malleolus noted noninfected.  No changes consistent with cellulitis. Lymph : No lichenification or skin changes of chronic lymphedema.  CBC Lab Results  Component Value Date   WBC 12.1 (H) 08/29/2016   HGB 9.3 (L) 08/29/2016   HCT 22.0 (L) 08/29/2016   MCV 90.5 08/29/2016   PLT 120 (L) 08/29/2016    BMET    Component Value Date/Time   NA 136 08/29/2016 0240   NA 140 04/23/2014 1320   K 4.8 08/29/2016 0240   K 4.8 04/23/2014 1320   CL 106 08/29/2016 0240   CL 109 (H) 04/23/2014 1320   CO2 25 08/29/2016 0240   CO2 26 04/23/2014 1320   GLUCOSE 117 (H) 08/29/2016 0240   GLUCOSE 95 04/23/2014 1320   BUN 32 (H) 08/29/2016 0240   BUN 22 (H) 04/23/2014 1320   CREATININE 1.01 (H) 08/29/2016 0240   CREATININE 1.02 04/23/2014 1320   CALCIUM 8.2 (L) 08/29/2016 0240   CALCIUM 8.5 04/23/2014 1320   GFRNONAA 49 (  L) 08/29/2016 0240   GFRNONAA 55 (L) 04/23/2014 1320   GFRNONAA 39 (L) 11/02/2013 0540   GFRAA 57 (L) 08/29/2016 0240   GFRAA >60 04/23/2014 1320   GFRAA 45 (L) 11/02/2013 0540   CrCl cannot be calculated (Patient's most recent lab result is older than the maximum 21 days allowed.).  COAG Lab Results  Component Value Date   INR 1.23 08/29/2016   INR 1.17 08/28/2016   INR 1.15 08/28/2016    Radiology No results found.   Assessment/Plan 1. Atherosclerosis of native arteries of the extremities with ulceration (HCC)  Recommend:  The patient has evidence of severe atherosclerotic changes of both lower extremities associated with ulceration and tissue loss of the left foot.  This represents a limb threatening ischemia and places the patient at the risk for limb loss.  Patient should undergo left leg angiography with the hope for intervention for limb salvage.  The risks and benefits as well as the alternative therapies was discussed in detail with the patient.  All questions were answered.  Patient agrees to  proceed with angiography.  The patient will follow up with me in the office after the procedure.    2. Skin ulcer of left ankle with fat layer exposed (HCC) Continue compression and Silvadene cream daily  3. Chronic venous insufficiency No surgery or intervention at this point in time.    I have had a long discussion with the patient regarding venous insufficiency and why it  causes symptoms. I have discussed with the patient the chronic skin changes that accompany venous insufficiency and the long term sequela such as infection and ulceration.  Patient will begin wearing graduated compression stockings class 1 (20-30 mmHg) or compression wraps on a daily basis a prescription was given. The patient will put the stockings on first thing in the morning and removing them in the evening. The patient is instructed specifically not to sleep in the stockings.    In addition, behavioral modification including several periods of elevation of the lower extremities during the day will be continued. I have demonstrated that proper elevation is a position with the ankles at heart level.  The patient is instructed to begin routine exercise, especially walking on a daily basis  Following the review of the ultrasound the patient will follow up in 2-3 months to reassess the degree of swelling and the control that graduated compression stockings or compression wraps  is offering.   The patient can be assessed for a Lymph Pump at that time  4. Bilateral carotid artery stenosis Recommend:  Given the patient's asymptomatic subcritical stenosis no further invasive testing or surgery at this time.  Duplex ultrasound shows <80% stenosis bilaterally.  Continue antiplatelet therapy as prescribed Continue management of CAD, HTN and Hyperlipidemia Healthy heart diet,  encouraged exercise at least 4 times per week Follow up in 6 months with duplex ultrasound and physical exam based on 60-79% stenosis of the Left  carotid artery    5. Chronic atrial fibrillation (HCC) Continue antiarrhythmia medications as already ordered, these medications have been reviewed and there are no changes at this time.  Continue anticoagulation as ordered by Cardiology Service   Levora Dredge, MD  01/31/2017 10:30 AM

## 2017-02-07 ENCOUNTER — Inpatient Hospital Stay: Admission: RE | Admit: 2017-02-07 | Payer: PPO | Source: Ambulatory Visit

## 2017-02-07 MED ORDER — CEFAZOLIN SODIUM-DEXTROSE 1-4 GM/50ML-% IV SOLN
1.0000 g | Freq: Once | INTRAVENOUS | Status: AC
  Administered 2017-02-08: 1 g via INTRAVENOUS

## 2017-02-08 ENCOUNTER — Ambulatory Visit
Admission: RE | Admit: 2017-02-08 | Discharge: 2017-02-08 | Disposition: A | Discharge status: Home / Self Care | Payer: PPO | Source: Ambulatory Visit | Attending: Vascular Surgery | Admitting: Vascular Surgery

## 2017-02-08 ENCOUNTER — Encounter
Admission: RE | Disposition: A | Discharge status: Home / Self Care | Payer: Self-pay | Source: Ambulatory Visit | Attending: Vascular Surgery

## 2017-02-08 ENCOUNTER — Encounter: Payer: Self-pay | Admitting: *Deleted

## 2017-02-08 DIAGNOSIS — I872 Venous insufficiency (chronic) (peripheral): Secondary | ICD-10-CM | POA: Insufficient documentation

## 2017-02-08 DIAGNOSIS — I509 Heart failure, unspecified: Secondary | ICD-10-CM | POA: Insufficient documentation

## 2017-02-08 DIAGNOSIS — Z95810 Presence of automatic (implantable) cardiac defibrillator: Secondary | ICD-10-CM | POA: Diagnosis not present

## 2017-02-08 DIAGNOSIS — L97322 Non-pressure chronic ulcer of left ankle with fat layer exposed: Secondary | ICD-10-CM | POA: Diagnosis not present

## 2017-02-08 DIAGNOSIS — H269 Unspecified cataract: Secondary | ICD-10-CM | POA: Diagnosis not present

## 2017-02-08 DIAGNOSIS — Z90710 Acquired absence of both cervix and uterus: Secondary | ICD-10-CM | POA: Insufficient documentation

## 2017-02-08 DIAGNOSIS — M810 Age-related osteoporosis without current pathological fracture: Secondary | ICD-10-CM | POA: Diagnosis not present

## 2017-02-08 DIAGNOSIS — I6523 Occlusion and stenosis of bilateral carotid arteries: Secondary | ICD-10-CM | POA: Diagnosis not present

## 2017-02-08 DIAGNOSIS — Z885 Allergy status to narcotic agent status: Secondary | ICD-10-CM | POA: Diagnosis not present

## 2017-02-08 DIAGNOSIS — Z8249 Family history of ischemic heart disease and other diseases of the circulatory system: Secondary | ICD-10-CM | POA: Insufficient documentation

## 2017-02-08 DIAGNOSIS — K219 Gastro-esophageal reflux disease without esophagitis: Secondary | ICD-10-CM | POA: Insufficient documentation

## 2017-02-08 DIAGNOSIS — Z9889 Other specified postprocedural states: Secondary | ICD-10-CM | POA: Insufficient documentation

## 2017-02-08 DIAGNOSIS — Z8261 Family history of arthritis: Secondary | ICD-10-CM | POA: Insufficient documentation

## 2017-02-08 DIAGNOSIS — I482 Chronic atrial fibrillation: Secondary | ICD-10-CM | POA: Insufficient documentation

## 2017-02-08 DIAGNOSIS — I70248 Atherosclerosis of native arteries of left leg with ulceration of other part of lower left leg: Secondary | ICD-10-CM | POA: Diagnosis not present

## 2017-02-08 DIAGNOSIS — I110 Hypertensive heart disease with heart failure: Secondary | ICD-10-CM | POA: Insufficient documentation

## 2017-02-08 DIAGNOSIS — Z881 Allergy status to other antibiotic agents status: Secondary | ICD-10-CM | POA: Insufficient documentation

## 2017-02-08 DIAGNOSIS — Z825 Family history of asthma and other chronic lower respiratory diseases: Secondary | ICD-10-CM | POA: Insufficient documentation

## 2017-02-08 DIAGNOSIS — Z955 Presence of coronary angioplasty implant and graft: Secondary | ICD-10-CM | POA: Diagnosis not present

## 2017-02-08 DIAGNOSIS — I7025 Atherosclerosis of native arteries of other extremities with ulceration: Secondary | ICD-10-CM | POA: Insufficient documentation

## 2017-02-08 DIAGNOSIS — Z823 Family history of stroke: Secondary | ICD-10-CM | POA: Diagnosis not present

## 2017-02-08 DIAGNOSIS — E079 Disorder of thyroid, unspecified: Secondary | ICD-10-CM | POA: Insufficient documentation

## 2017-02-08 DIAGNOSIS — I252 Old myocardial infarction: Secondary | ICD-10-CM | POA: Diagnosis not present

## 2017-02-08 DIAGNOSIS — Z888 Allergy status to other drugs, medicaments and biological substances status: Secondary | ICD-10-CM | POA: Diagnosis not present

## 2017-02-08 HISTORY — DX: Personal history of transient ischemic attack (TIA), and cerebral infarction without residual deficits: Z86.73

## 2017-02-08 HISTORY — PX: LOWER EXTREMITY ANGIOGRAPHY: CATH118251

## 2017-02-08 LAB — CREATININE, SERUM
Creatinine, Ser: 1.03 mg/dL — ABNORMAL HIGH (ref 0.44–1.00)
GFR calc Af Amer: 55 mL/min — ABNORMAL LOW (ref >60)
GFR, EST NON AFRICAN AMERICAN: 48 mL/min — AB (ref >60)

## 2017-02-08 LAB — BUN: BUN: 23 mg/dL — ABNORMAL HIGH (ref 6–20)

## 2017-02-08 SURGERY — LOWER EXTREMITY ANGIOGRAPHY
Anesthesia: Moderate Sedation | Laterality: Left

## 2017-02-08 MED ORDER — ASPIRIN 325 MG PO TABS: 325.0000 mg | ORAL_TABLET | ORAL | Status: DC

## 2017-02-08 MED ORDER — CLOPIDOGREL BISULFATE 75 MG PO TABS
ORAL_TABLET | ORAL | Status: AC
  Filled 2017-02-08: qty 4

## 2017-02-08 MED ORDER — HEPARIN SODIUM (PORCINE) 1000 UNIT/ML IJ SOLN
INTRAMUSCULAR | Status: AC
  Filled 2017-02-08: qty 1

## 2017-02-08 MED ORDER — FAMOTIDINE 20 MG PO TABS: 40.0000 mg | ORAL_TABLET | ORAL | Status: DC | PRN

## 2017-02-08 MED ORDER — CLOPIDOGREL BISULFATE 75 MG PO TABS: 75.0000 mg | ORAL_TABLET | Freq: Every day | ORAL | 3 refills | Status: DC

## 2017-02-08 MED ORDER — HEPARIN SODIUM (PORCINE) 1000 UNIT/ML IJ SOLN
INTRAMUSCULAR | Status: DC | PRN
  Administered 2017-02-08: 5000 [IU] via INTRAVENOUS

## 2017-02-08 MED ORDER — CLOPIDOGREL BISULFATE 75 MG PO TABS
300.0000 mg | ORAL_TABLET | Freq: Once | ORAL | Status: AC
  Administered 2017-02-08: 300 mg via ORAL

## 2017-02-08 MED ORDER — SODIUM CHLORIDE 0.9 % IV SOLN: INTRAVENOUS | Status: DC

## 2017-02-08 MED ORDER — FENTANYL CITRATE (PF) 100 MCG/2ML IJ SOLN
INTRAMUSCULAR | Status: DC | PRN
Start: 2017-02-08 — End: 2017-02-08
  Administered 2017-02-08: 50 ug via INTRAVENOUS
  Administered 2017-02-08 (×2): 25 ug via INTRAVENOUS

## 2017-02-08 MED ORDER — CLOPIDOGREL BISULFATE 75 MG PO TABS: 75.0000 mg | ORAL_TABLET | Freq: Every day | ORAL | Status: DC

## 2017-02-08 MED ORDER — LABETALOL HCL 5 MG/ML IV SOLN: 10.0000 mg | INTRAVENOUS | Status: DC | PRN

## 2017-02-08 MED ORDER — FENTANYL CITRATE (PF) 100 MCG/2ML IJ SOLN
INTRAMUSCULAR | Status: AC
  Filled 2017-02-08: qty 2

## 2017-02-08 MED ORDER — ASPIRIN EC 81 MG PO TBEC: 81.0000 mg | DELAYED_RELEASE_TABLET | Freq: Every day | ORAL | 2 refills | Status: DC

## 2017-02-08 MED ORDER — SODIUM CHLORIDE 0.9% FLUSH: 3.0000 mL | Freq: Two times a day (BID) | INTRAVENOUS | Status: DC

## 2017-02-08 MED ORDER — HYDRALAZINE HCL 20 MG/ML IJ SOLN: 5.0000 mg | INTRAMUSCULAR | Status: DC | PRN

## 2017-02-08 MED ORDER — CEFAZOLIN SODIUM-DEXTROSE 1-4 GM/50ML-% IV SOLN
INTRAVENOUS | Status: AC
  Filled 2017-02-08: qty 50

## 2017-02-08 MED ORDER — MORPHINE SULFATE (PF) 4 MG/ML IV SOLN: 2.0000 mg | INTRAVENOUS | Status: DC | PRN

## 2017-02-08 MED ORDER — MIDAZOLAM HCL 2 MG/2ML IJ SOLN
INTRAMUSCULAR | Status: AC
  Filled 2017-02-08: qty 2

## 2017-02-08 MED ORDER — OXYCODONE HCL 5 MG PO TABS
ORAL_TABLET | ORAL | Status: AC
  Administered 2017-02-08: 5 mg
  Filled 2017-02-08: qty 1

## 2017-02-08 MED ORDER — MIDAZOLAM HCL 2 MG/2ML IJ SOLN
INTRAMUSCULAR | Status: DC | PRN
  Administered 2017-02-08: 2 mg via INTRAVENOUS
  Administered 2017-02-08 (×4): 0.5 mg via INTRAVENOUS

## 2017-02-08 MED ORDER — SODIUM CHLORIDE 0.9% FLUSH: 3.0000 mL | INTRAVENOUS | Status: DC | PRN

## 2017-02-08 MED ORDER — LIDOCAINE HCL (PF) 1 % IJ SOLN
INTRAMUSCULAR | Status: AC
  Filled 2017-02-08: qty 30

## 2017-02-08 MED ORDER — ONDANSETRON HCL 4 MG/2ML IJ SOLN: 4.0000 mg | Freq: Four times a day (QID) | INTRAMUSCULAR | Status: DC | PRN

## 2017-02-08 MED ORDER — HYDROMORPHONE HCL 1 MG/ML IJ SOLN: 1.0000 mg | Freq: Once | INTRAMUSCULAR | Status: DC | PRN

## 2017-02-08 MED ORDER — METHYLPREDNISOLONE SODIUM SUCC 125 MG IJ SOLR: 125.0000 mg | INTRAMUSCULAR | Status: DC | PRN

## 2017-02-08 MED ORDER — SODIUM CHLORIDE 0.9 % IV SOLN
INTRAVENOUS | Status: DC
  Administered 2017-02-08: 10:00:00 via INTRAVENOUS

## 2017-02-08 MED ORDER — MIDAZOLAM HCL 2 MG/2ML IJ SOLN
INTRAMUSCULAR | Status: AC
  Filled 2017-02-08: qty 4

## 2017-02-08 MED ORDER — SODIUM CHLORIDE 0.9 % IV SOLN: 250.0000 mL | INTRAVENOUS | Status: DC | PRN

## 2017-02-08 MED ORDER — OXYCODONE HCL 5 MG PO TABS: 5.0000 mg | ORAL_TABLET | ORAL | Status: DC | PRN

## 2017-02-08 MED ORDER — IOPAMIDOL (ISOVUE-300) INJECTION 61%
INTRAVENOUS | Status: DC | PRN
  Administered 2017-02-08: 80 mL via INTRA_ARTERIAL

## 2017-02-08 MED ORDER — HEPARIN (PORCINE) IN NACL 2-0.9 UNIT/ML-% IJ SOLN
INTRAMUSCULAR | Status: AC
  Filled 2017-02-08: qty 1000

## 2017-02-08 SURGICAL SUPPLY — 26 items
BALLN LUTONIX 5X220X130 (BALLOONS) ×3
BALLN LUTONIX DCB 4X60X130 (BALLOONS) ×3
BALLN ULTRVRSE 3X80X150 (BALLOONS) ×3
BALLN ULTRVRSE 3X80X150 OTW (BALLOONS) ×1
BALLOON LUTONIX 5X220X130 (BALLOONS) IMPLANT
BALLOON LUTONIX DCB 4X60X130 (BALLOONS) IMPLANT
BALLOON ULTRVRSE 3X80X150 OTW (BALLOONS) IMPLANT
CATH BERNSTEIN 5FR 130CM (CATHETERS) ×2 IMPLANT
CATH CROSSER S6 154CM (CATHETERS) ×2 IMPLANT
CATH PIG 70CM (CATHETERS) ×2 IMPLANT
CATH USHER TPER 130CM (CATHETERS) ×2 IMPLANT
DEVICE PRESTO INFLATION (MISCELLANEOUS) ×2 IMPLANT
DEVICE STARCLOSE SE CLOSURE (Vascular Products) ×2 IMPLANT
DEVICE TORQUE (MISCELLANEOUS) ×2 IMPLANT
GLIDEWIRE ANGLED SS 035X260CM (WIRE) ×2 IMPLANT
GUIDEWIRE SUPER STIFF .035X180 (WIRE) ×2 IMPLANT
KIT FLOWMATE PROCEDURAL (MISCELLANEOUS) ×2 IMPLANT
NDL ENTRY 21GA 7CM ECHOTIP (NEEDLE) IMPLANT
NEEDLE ENTRY 21GA 7CM ECHOTIP (NEEDLE) ×3 IMPLANT
PACK ANGIOGRAPHY (CUSTOM PROCEDURE TRAY) ×3 IMPLANT
SET INTRO CAPELLA COAXIAL (SET/KITS/TRAYS/PACK) ×2 IMPLANT
SHEATH BRITE TIP 5FRX11 (SHEATH) ×2 IMPLANT
SHEATH RAABE 6FR (SHEATH) ×2 IMPLANT
WIRE G V18X300CM (WIRE) ×2 IMPLANT
WIRE J 3MM .035X145CM (WIRE) ×2 IMPLANT
WIRE MAGIC TORQUE 260C (WIRE) ×2 IMPLANT

## 2017-02-08 NOTE — OR Nursing (Signed)
Dr. Marijean Heath office called and left message to call patient. to schedule apt. In 3 weeks with duplex study

## 2017-02-08 NOTE — Discharge Instructions (Signed)
° °  [facility] ° °Discharge Instructions ° °Lower Extremity Angiogram; Angioplasty/Stenting ° °Please refer to the following instructions for your post-procedure care. Your surgeon or physician assistant will discuss any changes with you. ° °Activity ° °Avoid lifting more than 8 pounds (1 gallons of milk) for 72 hours (3 days) after your procedure. You may walk as much as you can tolerate. It's OK to drive after 72 hours. ° °Bathing/Showering ° °You may shower the day after your procedure. If you have a bandage, you may remove it at 24- 48 hours. Clean your incision site with mild soap and water. Pat the area dry with a clean towel. ° °Diet ° °Resume your pre-procedure diet. There are no special food restrictions following this procedure. All patients with peripheral vascular disease should follow a low fat/low cholesterol diet. In order to heal from your surgery, it is CRITICAL to get adequate nutrition. Your body requires vitamins, minerals, and protein. Vegetables are the best source of vitamins and minerals. Vegetables also provide the perfect balance of protein. Processed food has little nutritional value, so try to avoid this. ° °Medications ° °Resume taking all of your medications unless your doctor tells you not to. If your incision is causing pain, you may take over-the-counter pain relievers such as acetaminophen (Tylenol) ° °Follow Up ° °Follow up will be arranged at the time of your procedure. You may have an office visit scheduled or may be scheduled for surgery. Ask your surgeon if you have any questions. ° °Please call us immediately for any of the following conditions: °•Severe or worsening pain your legs or feet at rest or with walking. °•Increased pain, redness, drainage at your groin puncture site. °•Fever of 101 degrees or higher. °•If you have any mild or slow bleeding from your puncture site: lie down, apply firm constant pressure over the area with a piece of  gauze or a clean wash cloth for 30 minutes- no peeking!, call 911 right away if you are still bleeding after 30 minutes, or if the bleeding is heavy and unmanageable. ° °Reduce your risk factors of vascular disease: ° °Stop smoking. If you would like help call [facility] at 1-800-QUIT-NOW (1-800-784-8669) or [facility] at 336-586-4000. °Manage your cholesterol °Maintain a desired weight °Control your diabetes °Keep your blood pressure down ° °If you have any questions, please call the office at [phone] ° °

## 2017-02-08 NOTE — H&P (Signed)
[facility] History & Physical Update  The patient was interviewed and re-examined.  The patient's previous History and Physical has been reviewed and is unchanged.  There is no change in the plan of care. We plan to proceed with the scheduled procedure.  Levora Dredge, MD  02/08/2017, 10:16 AM

## 2017-02-08 NOTE — Op Note (Signed)
[facility] Percutaneous Study/Intervention Procedural Note   Date of Surgery: 02/08/2017  Surgeon:  Katha Cabal, MD.  Pre-operative Diagnosis: Atherosclerotic occlusive disease bilateral lower extremities with ulceration of the left ankle  Post-operative diagnosis: Same  Procedure(s) Performed: 1. Introduction catheter into left lower extremity 3rd order catheter placement  2. Contrast injection left lower extremity for distal runoff   3. Percutaneous transluminal angioplasty of the left superficial femoral artery to 5 mm with a Lutonix drug-eluting balloon   4.Crosser atherectomy of the left peroneal artery              5.  Percutaneous transluminal angioplasty of the left peroneal artery to 4 mm with a Lutonix drug-eluting balloon                      6.  Star close closure right common femoral arteriotomy                            Anesthesia: Conscious sedation was administered under my direct supervision by the interventional radiology RN. IV Versed plus fentanyl were utilized. Continuous ECG, pulse oximetry and blood pressure was monitored throughout the entire procedure. Conscious sedation was for a total of 113 minutes.  Sheath: 6 French Raby right common femoral  Contrast: 80 cc  Fluoroscopy Time: 9.0 minutes  Indications: Joanna Roberts presents with atherosclerotic occlusive disease of the lower extremities with a new ulceration of the lateral ankle.  The risks and benefits are reviewed all questions answered patient agrees to proceed for limb salvage.  Procedure: Joanna Roberts is a 83 y.o. y.o. female who was identified and appropriate procedural time out was performed. The patient was then placed supine on the table and prepped and draped in the usual sterile fashion.   Ultrasound was placed in the sterile sleeve and the right groin was evaluated the right common femoral artery was  echolucent and pulsatile indicating patency.  Image was recorded for the permanent record and under real-time visualization a microneedle was inserted into the common femoral artery microwire followed by a micro-sheath.  A J-wire was then advanced through the micro-sheath and a  5 [place] sheath was then inserted over a J-wire. J-wire was then advanced and a 5 French pigtail catheter was positioned at the level of T12. AP projection of the aorta was then obtained. Pigtail catheter was repositioned to above the bifurcation and a RAO view of the pelvis was obtained.  Subsequently a pigtail catheter with the stiff angle Glidewire was used to cross the aortic bifurcation the catheter wire were advanced down into the left distal external iliac artery. Oblique view of the femoral bifurcation was then obtained and subsequently the wire was reintroduced and the pigtail catheter negotiated into the SFA representing third order catheter placement. Distal runoff was then performed.  5000 units of heparin was then given and allowed to circulate and a Raby sheath was advanced up and over the bifurcation and positioned in the femoral artery.  A Glidewire and Kumpe catheter were then negotiated down to the distal popliteal where hand injection was used and a magnified image to demonstrate the occlusion of the tibioperoneal trunk and peroneal artery.  The S6 Crosser catheter was then prepped on the field and a straight pressure catheter was advanced into the cul-de-sac of the tibioperoneal trunk under magnified imaging. Using the Crosser catheter the occlusion of the tibioperoneal trunk and  peroneal was negotiated.  Injection of contrast confirmed intraluminal positioning.  Pressure catheter was then advanced across the lesion and hand injection of contrast was performed to demonstrate intraluminal positioning.  Distal runoff was then completed by hand injection through the catheter.  A 3 mm x 10 cm Ultraverse balloon was then  used to angioplasty the tibioperoneal trunk and peroneal artery. Inflation was to 10-12 atm for 1 full minute. Follow-up imaging demonstrated patency but significant residual stenosis.  Therefore, a 4 mm x 10 cm Lutonix drug-eluting balloon was advanced across the peritoneal lesion.  Inflation was to 6 atms for 2 minutes.  Follow-up imaging demonstrated less than 15 % residual stenosis.  Distal runoff was reassessed and unchanged patent down to the plantar arch.  Attention was now turned to the proximal SFA.  A 5 mm x 22 cm Lutonix drug-eluting balloon was used to angioplasty the superficial femoral artery. Inflation was to 12 atmospheres for 2 minutes. Follow-up imaging demonstrated patency with less than 10% residual stenosis.  Distal runoff was then reassessed.  After review of these images the sheath is pulled into the right external iliac oblique of the common femoral is obtained and a Star close device deployed. There no immediate Complications.  Findings: The abdominal aorta is opacified with a bolus injection contrast. Renal arteries are patent. The aorta itself has diffuse disease but no hemodynamically significant lesions. The common and external iliac arteries are widely patent bilaterally.  The left common femoral is widely patent as is the profunda femoris.  The SFA does indeed have a significant stenosis throughout its proximal half.  The distal SFA and popliteal arteries demonstrate diffuse disease but no hemodynamically significant lesions.  There is increasing disease and the trifurcation which is heavily diseased with occlusion of the anterior tibial and posterior tibial arteries, the proximal peroneal artery is occluded as is the tibioperoneal trunk.  The distal two thirds of the peroneal artery is patent and fills the foot  Following angioplasty peroneal now is in-line flow and looks quite nice with less than 5% residual stenosis. Angioplasty of the SFA yields an excellent result with  less than 10% residual stenosis.    Disposition: Patient was taken to the recovery room in stable condition having tolerated the procedure well.  Belenda Cruise Schnier 02/08/2017,12:10 PM

## 2017-02-09 ENCOUNTER — Encounter: Payer: Self-pay | Admitting: Vascular Surgery

## 2017-02-16 ENCOUNTER — Other Ambulatory Visit (INDEPENDENT_AMBULATORY_CARE_PROVIDER_SITE_OTHER): Payer: Self-pay

## 2017-02-17 ENCOUNTER — Ambulatory Visit (INDEPENDENT_AMBULATORY_CARE_PROVIDER_SITE_OTHER): Payer: PPO | Admitting: Vascular Surgery

## 2017-02-17 ENCOUNTER — Encounter (INDEPENDENT_AMBULATORY_CARE_PROVIDER_SITE_OTHER): Payer: PPO

## 2017-02-22 DIAGNOSIS — S46211A Strain of muscle, fascia and tendon of other parts of biceps, right arm, initial encounter: Secondary | ICD-10-CM | POA: Diagnosis not present

## 2017-03-03 ENCOUNTER — Encounter: Payer: Self-pay | Admitting: Student in an Organized Health Care Education/Training Program

## 2017-03-03 ENCOUNTER — Ambulatory Visit
Payer: PPO | Attending: Student in an Organized Health Care Education/Training Program | Admitting: Student in an Organized Health Care Education/Training Program

## 2017-03-03 VITALS — BP 134/69 | HR 75 | Temp 98.2°F | Resp 16 | Ht 64.0 in | Wt 125.0 lb

## 2017-03-03 DIAGNOSIS — Z881 Allergy status to other antibiotic agents status: Secondary | ICD-10-CM | POA: Diagnosis not present

## 2017-03-03 DIAGNOSIS — Z95810 Presence of automatic (implantable) cardiac defibrillator: Secondary | ICD-10-CM | POA: Insufficient documentation

## 2017-03-03 DIAGNOSIS — M47816 Spondylosis without myelopathy or radiculopathy, lumbar region: Secondary | ICD-10-CM

## 2017-03-03 DIAGNOSIS — M4854XA Collapsed vertebra, not elsewhere classified, thoracic region, initial encounter for fracture: Secondary | ICD-10-CM | POA: Insufficient documentation

## 2017-03-03 DIAGNOSIS — G8929 Other chronic pain: Secondary | ICD-10-CM | POA: Diagnosis not present

## 2017-03-03 DIAGNOSIS — M5116 Intervertebral disc disorders with radiculopathy, lumbar region: Secondary | ICD-10-CM | POA: Insufficient documentation

## 2017-03-03 DIAGNOSIS — M79661 Pain in right lower leg: Secondary | ICD-10-CM | POA: Diagnosis not present

## 2017-03-03 DIAGNOSIS — M4804 Spinal stenosis, thoracic region: Secondary | ICD-10-CM | POA: Diagnosis not present

## 2017-03-03 DIAGNOSIS — I739 Peripheral vascular disease, unspecified: Secondary | ICD-10-CM | POA: Diagnosis not present

## 2017-03-03 DIAGNOSIS — Z7982 Long term (current) use of aspirin: Secondary | ICD-10-CM | POA: Diagnosis not present

## 2017-03-03 DIAGNOSIS — Z5181 Encounter for therapeutic drug level monitoring: Secondary | ICD-10-CM | POA: Insufficient documentation

## 2017-03-03 DIAGNOSIS — Z79899 Other long term (current) drug therapy: Secondary | ICD-10-CM | POA: Diagnosis not present

## 2017-03-03 DIAGNOSIS — M79601 Pain in right arm: Secondary | ICD-10-CM | POA: Diagnosis not present

## 2017-03-03 DIAGNOSIS — I4891 Unspecified atrial fibrillation: Secondary | ICD-10-CM | POA: Diagnosis not present

## 2017-03-03 DIAGNOSIS — M48062 Spinal stenosis, lumbar region with neurogenic claudication: Secondary | ICD-10-CM | POA: Insufficient documentation

## 2017-03-03 DIAGNOSIS — M419 Scoliosis, unspecified: Secondary | ICD-10-CM | POA: Diagnosis not present

## 2017-03-03 DIAGNOSIS — M5136 Other intervertebral disc degeneration, lumbar region: Secondary | ICD-10-CM

## 2017-03-03 DIAGNOSIS — Z8673 Personal history of transient ischemic attack (TIA), and cerebral infarction without residual deficits: Secondary | ICD-10-CM | POA: Insufficient documentation

## 2017-03-03 DIAGNOSIS — I429 Cardiomyopathy, unspecified: Secondary | ICD-10-CM | POA: Insufficient documentation

## 2017-03-03 DIAGNOSIS — M25551 Pain in right hip: Secondary | ICD-10-CM | POA: Insufficient documentation

## 2017-03-03 DIAGNOSIS — M25552 Pain in left hip: Secondary | ICD-10-CM | POA: Insufficient documentation

## 2017-03-03 DIAGNOSIS — I252 Old myocardial infarction: Secondary | ICD-10-CM | POA: Diagnosis not present

## 2017-03-03 DIAGNOSIS — I509 Heart failure, unspecified: Secondary | ICD-10-CM | POA: Insufficient documentation

## 2017-03-03 DIAGNOSIS — E039 Hypothyroidism, unspecified: Secondary | ICD-10-CM | POA: Insufficient documentation

## 2017-03-03 DIAGNOSIS — X58XXXA Exposure to other specified factors, initial encounter: Secondary | ICD-10-CM | POA: Diagnosis not present

## 2017-03-03 DIAGNOSIS — F329 Major depressive disorder, single episode, unspecified: Secondary | ICD-10-CM | POA: Diagnosis not present

## 2017-03-03 DIAGNOSIS — I890 Lymphedema, not elsewhere classified: Secondary | ICD-10-CM | POA: Insufficient documentation

## 2017-03-03 DIAGNOSIS — I110 Hypertensive heart disease with heart failure: Secondary | ICD-10-CM | POA: Insufficient documentation

## 2017-03-03 DIAGNOSIS — I872 Venous insufficiency (chronic) (peripheral): Secondary | ICD-10-CM | POA: Insufficient documentation

## 2017-03-03 MED ORDER — OXYCODONE-ACETAMINOPHEN 5-325 MG PO TABS: 1.0000 | ORAL_TABLET | Freq: Three times a day (TID) | ORAL | 0 refills | Status: DC | PRN

## 2017-03-03 MED ORDER — GABAPENTIN 300 MG PO CAPS: 300.0000 mg | ORAL_CAPSULE | Freq: Every day | ORAL | 3 refills | Status: DC

## 2017-03-03 NOTE — Patient Instructions (Signed)
You were given 2 prescriptions for Percocet today. 

## 2017-03-03 NOTE — Progress Notes (Signed)
Nursing Pain Medication Assessment:  Safety precautions to be maintained throughout the outpatient stay will include: orient to surroundings, keep bed in low position, maintain call bell within reach at all times, provide assistance with transfer out of bed and ambulation.  Medication Inspection Compliance: Pill count conducted under aseptic conditions, in front of the patient. Neither the pills nor the bottle was removed from the patient's sight at any time. Once count was completed pills were immediately returned to the patient in their original bottle.  Medication: Oxycodone/APAP Pill/Patch Count: 1 of 60 pills remain Pill/Patch Appearance: Markings consistent with prescribed medication Bottle Appearance: Standard pharmacy container. Clearly labeled. Filled Date:09/12/ 2018 Last Medication intake:  Today

## 2017-03-03 NOTE — Progress Notes (Signed)
Patient's Name: Joanna Roberts  MRN: [id number]  Referring Provider: Marinda Elk, MD  DOB: Jul 29, 1928  PCP: Marinda Elk, MD  DOS: 03/03/2017  Note by: Gillis Santa, MD  Service setting: Ambulatory outpatient  Specialty: Interventional Pain Management  Location: [facility] (AMB) Pain Management Facility    Patient type: Established   Primary Reason(s) for Visit: Encounter for prescription drug management. (Level of risk: moderate)  CC: Hip Pain (bilateral) and Leg Pain (right, lower)  HPI  Joanna Roberts is a 83 y.o. year old, female patient, who comes today for a medication management evaluation. She has Pneumonia; A-fib ([place]); Acquired hypothyroidism; Atrial fibrillation ([place]); Cardiomyopathy ([place]); Chronic abdominal pain; Chronic anemia; Back pain, chronic; CN (constipation); DD (diverticular disease); Fatty infiltration of liver; Aggrieved; Bergmann's syndrome; History of colon polyps; Personal history of urinary infection; History of urinary anomaly; Incomplete bladder emptying; Cannot sleep; Frequent UTI; Esophagitis, reflux; Pain in rib; Tachycardia-bradycardia ([place]); Temporary cerebral vascular dysfunction; FOM (frequency of micturition); B12 deficiency; DDD (degenerative disc disease), lumbar; Facet syndrome, lumbar; Sacroiliac joint dysfunction; Spinal stenosis, lumbar region, with neurogenic claudication; Lumbar radiculopathy; Compression fracture of thoracic spine, non-traumatic ([place]); Idiopathic scoliosis; H/O varicose vein stripping; Chronic venous insufficiency; Chronic mesenteric ischemia ([place]); Hematoma of leg, right, initial encounter; Acute blood loss anemia; Sepsis ([place]); Protein-calorie malnutrition, severe; Lymphedema; Varicose veins of both lower extremities; Atherosclerosis of native arteries of the extremities with ulceration ([place]); Ankle ulcer ([place]); and Carotid stenosis on their problem list. Her primarily concern today is the Hip Pain (bilateral) and Leg Pain (right,  lower)  Pain Assessment: Location: Lower, Right Leg Radiating: n/a Onset: More than a month ago Duration: Chronic pain Quality: Sharp Severity: 8 /10 (self-reported pain score)  Note: Reported level is inconsistent with clinical observations. Clinically the patient looks like a 4/10 A 3/10 is viewed as "Moderate" and described as significantly interfering with activities of daily living (ADL). It becomes difficult to feed, bathe, get dressed, get on and off the toilet or to perform personal hygiene functions. Difficult to get in and out of bed or a chair without assistance. Very distracting. With effort, it can be ignored when deeply involved in activities.       When using our objective Pain Scale, levels between 6 and 10/10 are said to belong in an emergency room, as it progressively worsens from a 6/10, described as severely limiting, requiring emergency care not usually available at an outpatient pain management facility. At a 6/10 level, communication becomes difficult and requires great effort. Assistance to reach the emergency department may be required. Facial flushing and profuse sweating along with potentially dangerous increases in heart rate and blood pressure will be evident. Effect on ADL:   Timing: Intermittent Modifying factors: medication  Joanna Roberts was last scheduled for an appointment on 01/27/2017 for medication management. During today's appointment we reviewed Joanna Roberts's chronic pain status, as well as her outpatient medication regimen.  The patient  reports that she does not use drugs. Her body mass index is 21.46 kg/m.  Further details on both, my assessment(s), as well as the proposed treatment plan, please see below.  Controlled Substance Pharmacotherapy Assessment REMS (Risk Evaluation and Mitigation Strategy)  Analgesic: Percocet 5 mg twice a day when necessary, quantity 60 a month MME/day: approximately 10 mg/day.  Landis Martins, RN  03/03/2017 10:05 AM  Sign at  close encounter Nursing Pain Medication Assessment:  Safety precautions to be maintained throughout the outpatient stay will include: orient to surroundings, keep  bed in low position, maintain call bell within reach at all times, provide assistance with transfer out of bed and ambulation.  Medication Inspection Compliance: Pill count conducted under aseptic conditions, in front of the patient. Neither the pills nor the bottle was removed from the patient's sight at any time. Once count was completed pills were immediately returned to the patient in their original bottle.  Medication: Oxycodone/APAP Pill/Patch Count: 1 of 60 pills remain Pill/Patch Appearance: Markings consistent with prescribed medication Bottle Appearance: Standard pharmacy container. Clearly labeled. Filled Date:09/12/ 2018 Last Medication intake:  Today   Pharmacokinetics: Liberation and absorption (onset of action): WNL Distribution (time to peak effect): WNL Metabolism and excretion (duration of action): WNL         Pharmacodynamics: Desired effects: Analgesia: Ms. Joanna Roberts reports >50% benefit. Functional ability: Patient reports that medication allows her to accomplish basic ADLs Clinically meaningful improvement in function (CMIF): Sustained CMIF goals met Perceived effectiveness: Described as relatively effective, allowing for increase in activities of daily living (ADL) Undesirable effects: Side-effects or Adverse reactions: None reported Monitoring: [place] PMP: Online review of the past 13-monthperiod conducted. Compliant with practice rules and regulations Last UDS on record: Summary  Date Value Ref Range Status  12/09/2016 FINAL  Final    Comment:    ==================================================================== TOXASSURE COMP DRUG ANALYSIS,UR ==================================================================== Test                             Result       Flag       Units Drug Present and Declared for  Prescription Verification   Oxazepam                       551          EXPECTED   ng/mg creat   Temazepam                      >2105        EXPECTED   ng/mg creat    Oxazepam and temazepam are expected metabolites of diazepam.    Oxazepam is also an expected metabolite of other benzodiazepine    drugs, including chlordiazepoxide, prazepam, clorazepate,    halazepam, and temazepam.  Oxazepam and temazepam are available    as scheduled prescription medications.   Oxycodone                      1667         EXPECTED   ng/mg creat   Oxymorphone                    697          EXPECTED   ng/mg creat   Noroxycodone                   1253         EXPECTED   ng/mg creat   Noroxymorphone                 118          EXPECTED   ng/mg creat    Sources of oxycodone are scheduled prescription medications.    Oxymorphone, noroxycodone, and noroxymorphone are expected    metabolites of oxycodone. Oxymorphone is also available as a    scheduled prescription medication.   Gabapentin  PRESENT      EXPECTED   Acetaminophen                  PRESENT      EXPECTED Drug Absent but Declared for Prescription Verification   Hydrocodone                    Not Detected UNEXPECTED ng/mg creat   Diclofenac                     Not Detected UNEXPECTED    Diclofenac, as indicated in the declared medication list, is not    always detected even when used as directed. ==================================================================== Test                      Result    Flag   Units      Ref Range   Creatinine              95               mg/dL      >=20 ==================================================================== Declared Medications:  The flagging and interpretation on this report are based on the  following declared medications.  Unexpected results may arise from  inaccuracies in the declared medications.  **Note: The testing scope of this panel includes these medications:  Gabapentin   Hydrocodone (Hydrocodone-Acetaminophen)  Oxycodone  Oxycodone (Oxycodone Acetaminophen)  Temazepam  **Note: The testing scope of this panel does not include small to  moderate amounts of these reported medications:  Acetaminophen (Hydrocodone-Acetaminophen)  Acetaminophen (Oxycodone Acetaminophen)  Diclofenac  **Note: The testing scope of this panel does not include following  reported medications:  Amoxicillin (Augmentin)  Atropine (Lomotil)  Budesonide  Carvedilol  Ciprofloxacin  Clavulinate (Augmentin)  Cyanocobalamin  Diphenoxylate (Lomotil)  Docusate (Colace)  Ferrous Gluconate  Furosemide  Levothyroxine  Loperamide  Losartan (Losartan Potassium)  Metronidazole  Nitroglycerin  Ondansetron  Prednisone  Silver  Sulfamethoxazole (Trimethoprim-Sulfamethoxazole)  Trimethoprim (Trimethoprim-Sulfamethoxazole) ==================================================================== For clinical consultation, please call [phone]. ====================================================================    UDS interpretation: Compliant          Medication Assessment Form: Reviewed. Patient indicates being compliant with therapy Treatment compliance: Compliant Risk Assessment Profile: Aberrant behavior: See prior evaluations. None observed or detected today Comorbid factors increasing risk of overdose: See prior notes. No additional risks detected today Risk of substance use disorder (SUD): Low  ORT Scoring interpretation table:  Score <3 = Low Risk for SUD  Score between 4-7 = Moderate Risk for SUD  Score >8 = High Risk for Opioid Abuse   Risk Mitigation Strategies:  Patient Counseling: Covered Patient-Prescriber Agreement (PPA): Present and active  Notification to other healthcare providers: Done  Pharmacologic Plan: No change in therapy, at this time  Laboratory Chemistry  Inflammation Markers (CRP: Acute Phase) (ESR: Chronic Phase) No results found for: CRP,  ESRSEDRATE               Renal Function Markers Lab Results  Component Value Date   BUN 23 (H) 02/08/2017   CREATININE 1.03 (H) 02/08/2017   GFRAA 55 (L) 02/08/2017   GFRNONAA 48 (L) 02/08/2017                 Hepatic Function Markers Lab Results  Component Value Date   AST 28 06/02/2016   ALT 15 06/02/2016   ALBUMIN 2.7 (L) 06/02/2016   ALKPHOS 30 (L) 06/02/2016  Electrolytes Lab Results  Component Value Date   NA 136 08/29/2016   K 4.8 08/29/2016   CL 106 08/29/2016   CALCIUM 8.2 (L) 08/29/2016   MG 1.7 03/09/2016                 Neuropathy Markers No results found for: [id number]               Bone Pathology Markers Lab Results  Component Value Date   ALKPHOS 30 (L) 06/02/2016   CALCIUM 8.2 (L) 08/29/2016                 Rheumatology Markers No results found for: [facility]              Coagulation Parameters Lab Results  Component Value Date   INR 1.23 08/29/2016   LABPROT 15.6 (H) 08/29/2016   APTT 38 (H) 08/27/2016   PLT 120 (L) 08/29/2016                 Cardiovascular Markers Lab Results  Component Value Date   TROPONINI <0.03 08/29/2016   HGB 9.3 (L) 08/29/2016   HCT 22.0 (L) 08/29/2016                 CA Markers No results found for: CEA, CA125               Note: Lab results reviewed.  Recent Diagnostic Imaging Results  PERIPHERAL VASCULAR CATHETERIZATION See op note  Complexity Note: Imaging results reviewed. Results shared with Ms. Crawshaw, using Layman's terms.                         Meds   Current Outpatient Medications:  .  acetaminophen (TYLENOL) 500 MG tablet, Take 1,000 mg by mouth every 8 (eight) hours as needed for mild pain or headache., Disp: , Rfl:  .  aspirin EC 81 MG tablet, Take 1 tablet (81 mg total) by mouth daily., Disp: 150 tablet, Rfl: 2 .  carvedilol (COREG) 25 MG tablet, TAKE ONE TABLET BY MOUTH TWICE DAILY, Disp: , Rfl:  .  clopidogrel (PLAVIX) 75 MG tablet, Take 1 tablet (75 mg total) by mouth  daily., Disp: 30 tablet, Rfl: 3 .  diclofenac sodium (VOLTAREN) 1 % GEL, Apply 4 g topically 4 (four) times daily. (Patient taking differently: Apply 4 g topically 4 (four) times daily as needed. ), Disp: 100 g, Rfl: 2 .  furosemide (LASIX) 20 MG tablet, Take 1 tablet (20 mg total) by mouth daily as needed for fluid., Disp: , Rfl:  .  gabapentin (NEURONTIN) 300 MG capsule, Take 1 capsule (300 mg total) at bedtime by mouth., Disp: 30 capsule, Rfl: 3 .  levothyroxine (SYNTHROID, LEVOTHROID) 25 MCG tablet, TAKE 1 TABLET EVERY DAY ON EMPTY STOMACHWITH A GLASS OF WATER AT LEAST 30-60 MINBEFORE BREAKFAST, Disp: , Rfl:  .  losartan (COZAAR) 25 MG tablet, TAKE ONE TABLET EVERY DAY IN THE MORNING, Disp: , Rfl:  .  nitroGLYCERIN (NITROSTAT) 0.4 MG SL tablet, Place 0.4 mg under the tongue every 5 (five) minutes as needed for chest pain. , Disp: , Rfl:  .  ondansetron (ZOFRAN) 4 MG tablet, Take 4 mg by mouth every 8 (eight) hours as needed for nausea or vomiting. , Disp: , Rfl:  .  oxyCODONE-acetaminophen (PERCOCET/ROXICET) 5-325 MG tablet, Take 1 tablet every 8 (eight) hours as needed by mouth for severe pain. For chronic pain Fill on or after 03/03/17, 04/01/17,  Disp: 90 tablet, Rfl: 0 .  silver sulfADIAZINE (SILVADENE) 1 % cream, Apply 1 application topically 2 (two) times daily. Apply to area twice a day (Patient taking differently: Apply 1 application topically daily. On ankle), Disp: 25 g, Rfl: 2 .  vitamin B-12 (CYANOCOBALAMIN) 1000 MCG tablet, Take 1,000 mcg by mouth daily., Disp: , Rfl:  .  diphenoxylate-atropine (LOMOTIL) 2.5-0.025 MG tablet, Take 1-2 tablets by mouth 4 (four) times daily as needed for diarrhea or loose stools. (Patient not taking: Reported on 02/01/2017), Disp: 40 tablet, Rfl: 0 .  docusate sodium (COLACE) 100 MG capsule, Take 1 capsule (100 mg total) by mouth 2 (two) times daily as needed (take to keep stool soft.). (Patient not taking: Reported on 02/01/2017), Disp: 60 capsule, Rfl: 0 .   ferrous sulfate 325 (65 FE) MG tablet, Take 1 tablet (325 mg total) by mouth daily with breakfast. (Patient not taking: Reported on 02/01/2017), Disp: 30 tablet, Rfl: 0  Current Facility-Administered Medications:  .  mirabegron ER (MYRBETRIQ) tablet 25 mg, 25 mg, Oral, Daily, Rubie Maid, MD  ROS  Constitutional: Denies any fever or chills Gastrointestinal: No reported hemesis, hematochezia, vomiting, or acute GI distress Musculoskeletal: Denies any acute onset joint swelling, redness, loss of ROM, or weakness Neurological: No reported episodes of acute onset apraxia, aphasia, dysarthria, agnosia, amnesia, paralysis, loss of coordination, or loss of consciousness  Allergies  Ms. Basquez is allergic to lortab [hydrocodone-acetaminophen]; doxycycline; macrobid [nitrofurantoin monohyd macro]; and nitrofurantoin.  PFSH  Drug: Ms. Gillyard  reports that she does not use drugs. Alcohol:  reports that she does not drink alcohol. Tobacco:  reports that  has never smoked. she has never used smokeless tobacco. Medical:  has a past medical history of AICD (automatic cardioverter/defibrillator) present, Allergy, Anemia, Arthritis, Cataract, CHF (congestive heart failure) ([place]), Depression, Dysrhythmia, GERD (gastroesophageal reflux disease), Heart murmur, History of TIAs, Hypertension, Myocardial infarction ([place]), Osteoporosis, Presence of permanent cardiac pacemaker, and Thyroid disease. Surgical: Ms. Dattilio  has a past surgical history that includes Hernia repair; Insert / replace / remove pacemaker; Appendectomy; Abdominal hysterectomy; pessary; Lower Extremity Angiography (Left, 02/08/2017); HIP FASCIOTOMY (Right, 08/27/2016); Visceral Angiography (N/A, 03/23/2016); Lower Extremity Angiography (Right, 10/08/2014); and Lower Extremity Intervention (10/08/2014). Family: family history includes Arthritis in her father; Asthma in her father; Early death in her mother; Heart disease in her mother; Stroke in her  sister.  Constitutional Exam  General appearance: Well nourished, well developed, and well hydrated. In no apparent acute distress Vitals:   03/03/17 0958  BP: 134/69  Pulse: 75  Resp: 16  Temp: 98.2 F (36.8 C)  TempSrc: Oral  SpO2: 99%  Weight: 125 lb (56.7 kg)  Height: '5\' 4"'$  (1.626 m)   BMI Assessment: Estimated body mass index is 21.46 kg/m as calculated from the following:   Height as of this encounter: '5\' 4"'$  (1.626 m).   Weight as of this encounter: 125 lb (56.7 kg).  BMI interpretation table: BMI level Category Range association with higher incidence of chronic pain  <18 kg/m2 Underweight   18.5-24.9 kg/m2 Ideal body weight   25-29.9 kg/m2 Overweight Increased incidence by 20%  30-34.9 kg/m2 Obese (Class I) Increased incidence by 68%  35-39.9 kg/m2 Severe obesity (Class II) Increased incidence by 136%  >40 kg/m2 Extreme obesity (Class III) Increased incidence by 254%   BMI Readings from Last 4 Encounters:  03/03/17 21.46 kg/m  02/08/17 24.02 kg/m  01/27/17 21.11 kg/m  01/04/17 21.28 kg/m   Wt Readings from Last 4  Encounters:  03/03/17 125 lb (56.7 kg)  02/08/17 123 lb (55.8 kg)  01/27/17 123 lb (55.8 kg)  01/04/17 124 lb (56.2 kg)  Psych/Mental status: Alert, oriented x 3 (person, place, & time)       Eyes: PERLA Respiratory: No evidence of acute respiratory distress Cervical Spine Area Exam  Skin & Axial Inspection:Paravertebral muscle atrophy Alignment:Asymmetric Functional ZTI:[id number] ROM Stability:No instability detected Muscle Tone/Strength:Functionally intact. No obvious neuro-muscular anomalies detected. Sensory (Neurological):Movement-associated pain Palpation:Complains of area being tender to palpation         Upper Extremity (UE) Exam    Side:Right upper extremity  Side:Left upper extremity   Skin & Extremity Inspection:Positive color changes  Skin & Extremity Inspection:Positive color changes    Functional ASN:[id number] ROM  Functional XTK:[id number] ROM   Muscle Tone/Strength:Mild-to-medarate deconditioning  Muscle Tone/Strength:Moderate-to-severe deconditioning   Sensory (Neurological):Movement-associated pain  Sensory (Neurological):Movement-associated pain   Palpation:Tender to palpation along right proximal bicep  Palpation:No palpable anomalies   Specialized Test(s):Deferred  Specialized Test(s):Deferred   Right upper extremity strength: Elbow flexion, elbow extension 4 out of 5. Patient unable to AB duct right arm secondary to shoulder pain. Thoracic Spine Area Exam  Skin & Axial Inspection:Significant thoracic kyphosis Alignment:Asymmetric Functional EQA:[id number] ROM Stability:No instability detected Muscle Tone/Strength:Functionally intact. No obvious neuro-muscular anomalies detected. Sensory (Neurological):Movement associated pain Muscle strength & Tone:Complains of area being tender to palpationoverlying thoracic vertebra  Lumbar Spine Area Exam  Skin & Axial Inspection:Thoraco-lumbar Scoliosis Alignment:Asymmetric Functional LNL:[id number] ROM Stability:No instability detected Muscle Tone/Strength:Functionally intact. No obvious neuro-muscular anomalies detected. Sensory (Neurological):Movement-associated pain Palpation:Tender Provocative Tests: Lumbar Hyperextension and rotation test:Positive Lumbar Lateral bending test:Positive Patrick's Maneuver:evaluation deferred today  Gait & Posture Assessment  Ambulation:Patient ambulates using a walker Gait:Age-related, senile gait pattern Posture:Thoracic kyphosis  Lower Extremity Exam    Side:Right lower extremity  Side:Left lower extremity  Skin & Extremity Inspection:Positive color changes, vascular changes noted, mild venous stasis edema   Skin & Extremity  Inspection:Positive color changes  Functional EYC:[id number] ROM  Functional JSH:[id number] ROM  Muscle Tone/Strength:Functionally intact. No obvious neuro-muscular anomalies detected.  Muscle Tone/Strength:Functionally intact. No obvious neuro-muscular anomalies detected.  Sensory (Neurological):Movement-associated pain  Sensory (Neurological):Unimpaired  Palpation:Complains of area being tender to palpation     Palpation: No palpable anomalies  Palpation: No palpable anomalies     Assessment  Primary Diagnosis & Pertinent Problem List: The primary encounter diagnosis was DDD (degenerative disc disease), lumbar. Diagnoses of Facet syndrome, lumbar, Non-traumatic compression fracture of twelfth thoracic vertebra, initial encounter [facility]), Spinal stenosis, lumbar region, with neurogenic claudication, Compression fracture of thoracic spine, non-traumatic, initial encounter ([place]), and Right arm pain were also pertinent to this visit.  Status Diagnosis  Controlled Controlled Controlled 1. DDD (degenerative disc disease), lumbar   2. Facet syndrome, lumbar   3. Non-traumatic compression fracture of twelfth thoracic vertebra, initial encounter ([place])   4. Spinal stenosis, lumbar region, with neurogenic claudication   5. Compression fracture of thoracic spine, non-traumatic, initial encounter ([place])   6. Right arm pain      Patient is a very pleasant 83 year old female with a complex and extensive past medical history including cardiac history significant for atrial fibrillation, cardiomyopathy, peripheral vascular disease who is referred for evaluation of low back pain that is present on both sides with radiation to her right knee in a posterior dermatomal distribution involving the S1-S2 dermatomes primarily. Patient's low back pain which radiates to the right leg is multifactorial secondary to severe scoliosis, severe degenerative lumbar spondylosis with multilevel  facetogenic disease, compression fractures of T12 and L4, and moderate spinal canal stenosis and lateral recess stenosis at L2-L3 and at L4-L5. Patient does have symptoms of spinal stenosis with neurogenic claudication.In terms of her medication regimen, patient takes gabapentin 300 mg daily at bedtime along with oxycodone 5 mg 2-3 times a day as needed for severe pain. Patient is low risk for abuse/misuse of chronic opioid therapy.  Patient returns today for follow-up.  Of note, patient was closing a car door when she felt her right arm "pop".  She was seen by orthopedic surgery clinic was diagnosed with right biceps tendinitis and conservative therapy was offered.  On exam patient does have limited range of motion of her right arm and has difficulty with arm abduction and weakness with elbow flexion and elbow extension secondary to pain.  Patient had been using one Percocet daily prior to her biceps tendinitis but has been using them more often since acute on chronic pain flare.  Plan: -Continue with exercises that were given to you by orthopedic surgery for your right bicep rehabilitation -Prescription for Percocet 5 mg up to 3 times a day as needed for severe pain.  Prescription for 2 months. -Continue with gabapentin 300 mg nightly -Follow-up in 2 months or earlier.    Plan of Care  Pharmacotherapy (Medications Ordered): Meds ordered this encounter  Medications  . DISCONTD: oxyCODONE-acetaminophen (PERCOCET/ROXICET) 5-325 MG tablet    Sig: Take 1 tablet every 8 (eight) hours as needed by mouth for severe pain. For chronic pain Fill on or after 03/03/17, 04/01/17    Dispense:  90 tablet    Refill:  0    To last for 30 days from fill date  . gabapentin (NEURONTIN) 300 MG capsule    Sig: Take 1 capsule (300 mg total) at bedtime by mouth.    Dispense:  30 capsule    Refill:  3  . oxyCODONE-acetaminophen (PERCOCET/ROXICET) 5-325 MG tablet    Sig: Take 1 tablet every 8 (eight) hours as  needed by mouth for severe pain. For chronic pain Fill on or after 03/03/17, 04/01/17    Dispense:  90 tablet    Refill:  0    To last for 30 days from fill date   Lab-work, procedure(s), and/or referral(s): No orders of the defined types were placed in this encounter.   Provider-requested follow-up: Return in about 8 weeks (around 04/28/2017) for Medication Management.  Future Appointments  Date Time Provider [place]  03/10/2017 10:00 AM [facility] 3 [facility] None  03/10/2017 11:00 AM Stegmayer, Janalyn Harder, PA-C [facility] None  04/28/2017 10:00 AM Gillis Santa, MD [facility] None  07/18/2017  2:30 PM [facility] V[facility] 1 [facility] None  07/18/2017  3:30 PM Schnier, Dolores Lory, MD [facility] None    Primary Care Physician: Marinda Elk, MD Location: [facility] Management [facility] Note by: Gillis Santa, M.D Date: 03/03/2017; Time: 3:25 PM  Patient Instructions  You were given 2 prescriptions for Percocet today.

## 2017-03-09 ENCOUNTER — Other Ambulatory Visit (INDEPENDENT_AMBULATORY_CARE_PROVIDER_SITE_OTHER): Payer: Self-pay | Admitting: Vascular Surgery

## 2017-03-09 DIAGNOSIS — I7090 Unspecified atherosclerosis: Secondary | ICD-10-CM

## 2017-03-10 ENCOUNTER — Ambulatory Visit (INDEPENDENT_AMBULATORY_CARE_PROVIDER_SITE_OTHER): Payer: PPO

## 2017-03-10 ENCOUNTER — Ambulatory Visit (INDEPENDENT_AMBULATORY_CARE_PROVIDER_SITE_OTHER): Payer: PPO | Admitting: Vascular Surgery

## 2017-03-10 DIAGNOSIS — I7090 Unspecified atherosclerosis: Secondary | ICD-10-CM | POA: Diagnosis not present

## 2017-03-16 ENCOUNTER — Encounter (INDEPENDENT_AMBULATORY_CARE_PROVIDER_SITE_OTHER): Payer: Self-pay | Admitting: Vascular Surgery

## 2017-03-16 ENCOUNTER — Ambulatory Visit (INDEPENDENT_AMBULATORY_CARE_PROVIDER_SITE_OTHER): Payer: PPO | Admitting: Vascular Surgery

## 2017-03-16 VITALS — BP 116/75 | HR 75 | Resp 15 | Ht 63.0 in | Wt 125.0 lb

## 2017-03-16 DIAGNOSIS — I7025 Atherosclerosis of native arteries of other extremities with ulceration: Secondary | ICD-10-CM

## 2017-03-16 DIAGNOSIS — I890 Lymphedema, not elsewhere classified: Secondary | ICD-10-CM | POA: Diagnosis not present

## 2017-03-16 NOTE — Progress Notes (Signed)
Subjective:    Patient ID: Joanna Roberts, female    DOB: July 30, 1928, 83 y.o.   MRN: [id number] Chief Complaint  Patient presents with  . Follow-up    3 week follow up   Patient presents to review vascular studies.  The patient is status post a left lower extremity angiogram with intervention on February 08, 2017 for increasing claudication with left lateral ankle ulceration.  She presents today without complaint.  Her left lateral ankle ulceration continues to heal.  The patient continues to use zinc oxide covering the ulcer with a Band-Aid.  There are no active/new ulcerations to the right lower extremity.  The patient underwent a bilateral lower extremity arterial duplex exam which was notable for a patent left superficial femoral artery with no hemodynamically significant stenosis.  The left anterior tibial and posterior tibial artery is chronically occluded with no flow detected with a known occlusion by angiogram on February 08, 2017.  Doppler velocities suggest 50-74% stenosis of the right superficial femoral artery.  Dampened monophasic Doppler waveforms are seen in the right distal peroneal artery with a known occlusion of the right posterior tibial and anterior tibial arteries by angiogram on October 08, 2014.  Denies any fever, nausea vomiting.   Review of Systems  Constitutional: Negative.   HENT: Negative.   Eyes: Negative.   Respiratory: Negative.   Cardiovascular: Negative.   Gastrointestinal: Negative.   Endocrine: Negative.   Genitourinary: Negative.   Musculoskeletal: Negative.   Skin: Positive for wound.  Allergic/Immunologic: Negative.   Neurological: Negative.   Hematological: Negative.   Psychiatric/Behavioral: Negative.       Objective:   Physical Exam  Constitutional: She is oriented to person, place, and time. She appears well-developed and well-nourished. No distress.  HENT:  Head: Normocephalic and atraumatic.  Eyes: Conjunctivae are normal. Pupils are equal,  round, and reactive to light.  Neck: Normal range of motion.  Cardiovascular: Normal rate, regular rhythm, normal heart sounds and intact distal pulses.  Pulses:      Radial pulses are 2+ on the right side, and 2+ on the left side.  Hard to palpate pedal pulses however her bilateral feet are warm  Pulmonary/Chest: Effort normal and breath sounds normal.  Musculoskeletal: Normal range of motion. She exhibits no edema.  Neurological: She is alert and oriented to person, place, and time.  Skin: She is not diaphoretic.  Left lateral ankle: Less than 1 cm x 1 cm small shallow ulceration located on the malleolus.  There is no infection.  There is no cellulitis.  There is no drainage.  Psychiatric: She has a normal mood and affect. Her behavior is normal. Judgment and thought content normal.  Vitals reviewed.  BP 116/75 (BP Location: Left Arm)   Pulse 75   Resp 15   Ht 5\' 3"  (1.6 m)   Wt 125 lb (56.7 kg)   BMI 22.14 kg/m   Past Medical History:  Diagnosis Date  . AICD (automatic cardioverter/defibrillator) present   . Allergy   . Anemia   . Arthritis   . Cataract   . CHF (congestive heart failure) (HCC)   . Depression   . Dysrhythmia   . GERD (gastroesophageal reflux disease)   . Heart murmur   . History of TIAs   . Hypertension   . Myocardial infarction (HCC)   . Osteoporosis   . Presence of permanent cardiac pacemaker   . Thyroid disease    Social History   Socioeconomic History  .  Marital status: Widowed    Spouse name: Not on file  . Number of children: Not on file  . Years of education: Not on file  . Highest education level: Not on file  Social Needs  . Financial resource strain: Not on file  . Food insecurity - worry: Not on file  . Food insecurity - inability: Not on file  . Transportation needs - medical: Not on file  . Transportation needs - non-medical: Not on file  Occupational History  . Not on file  Tobacco Use  . Smoking status: Never Smoker  .  Smokeless tobacco: Never Used  Substance and Sexual Activity  . Alcohol use: No  . Drug use: No  . Sexual activity: Not Currently    Birth control/protection: Post-menopausal  Other Topics Concern  . Not on file  Social History Narrative  . Not on file   Past Surgical History:  Procedure Laterality Date  . ABDOMINAL HYSTERECTOMY    . APPENDECTOMY    . HERNIA REPAIR    . INSERT / REPLACE / REMOVE PACEMAKER    . LOWER EXTREMITY ANGIOGRAPHY Left 02/08/2017   Procedure: Lower Extremity Angiography;  Surgeon: Renford Dills, MD;  Location: [facility];  Service: Cardiovascular;  Laterality: Left;  . PERIPHERAL VASCULAR CATHETERIZATION Right 10/08/2014   Procedure: Lower Extremity Angiography;  Surgeon: Renford Dills, MD;  Location: [facility];  Service: Cardiovascular;  Laterality: Right;  . PERIPHERAL VASCULAR CATHETERIZATION  10/08/2014   Procedure: Lower Extremity Intervention;  Surgeon: Renford Dills, MD;  Location: [facility];  Service: Cardiovascular;;  . PERIPHERAL VASCULAR CATHETERIZATION N/A 03/23/2016   Procedure: Visceral Angiography;  Surgeon: Renford Dills, MD;  Location: [facility];  Service: Cardiovascular;  Laterality: N/A;  . pessary     Family History  Problem Relation Age of Onset  . Early death Mother   . Heart disease Mother   . Arthritis Father   . Asthma Father   . Stroke Sister    Allergies  Allergen Reactions  . Lortab [Hydrocodone-Acetaminophen] Itching, Swelling and Rash  . Doxycycline Diarrhea  . Macrobid [Nitrofurantoin Monohyd Macro] Diarrhea and Nausea And Vomiting  . Nitrofurantoin Diarrhea, Nausea Only and Nausea And Vomiting      Assessment & Plan:  Patient presents to review vascular studies.  The patient is status post a left lower extremity angiogram with intervention on February 08, 2017 for increasing claudication with left lateral ankle ulceration.  She presents today without complaint.   Her left lateral ankle ulceration continues to heal.  The patient continues to use zinc oxide covering the ulcer with a Band-Aid.  There are no active/new ulcerations to the right lower extremity.  The patient underwent a bilateral lower extremity arterial duplex exam which was notable for a patent left superficial femoral artery with no hemodynamically significant stenosis.  The left anterior tibial and posterior tibial artery is chronically occluded with no flow detected with a known occlusion by angiogram on February 08, 2017.  Doppler velocities suggest 50-74% stenosis of the right superficial femoral artery.  Dampened monophasic Doppler waveforms are seen in the right distal peroneal artery with a known occlusion of the right posterior tibial and anterior tibial arteries by angiogram on October 08, 2014.  Denies any fever, nausea vomiting.  1. Atherosclerosis of native arteries of the extremities with ulceration (HCC) - Stable Patient presents today with an improvement to the ulceration she was evaluated for pre-left lower  extremity angiogram. We discussed possibly moving forward with a right lower extremity angiogram.  At this time, the patient does not feel she is symptomatic enough to move forward. We will revisit this when she follows up in 6 months for repeat ABI with bilateral lower extremity arterial duplex. Patient to continue placing zinc oxide to the left lateral malleolus. Patient to continue compression and elevation to control her lower extremity venous disease. Patient to call the office sooner if she should experience any decline in the healing status of her left ankle ulceration.  - VAS [place] ABI WITH/WO TBI; Future - VAS [place] LOWER EXTREMITY ARTERIAL DUPLEX; Future  2. Lymphedema - Stable Patient is a peeling the co-pay of $350 she received for the lymphedema pump I ordered. If the patient's appeal is not successful I instructed her to call the office and I will contact bio tab to see if  they are able to help. Patient to continue compression and elevation to control her lower extremity edema.  Current Outpatient Medications on File Prior to Visit  Medication Sig Dispense Refill  . acetaminophen (TYLENOL) 500 MG tablet Take 1,000 mg by mouth every 8 (eight) hours as needed for mild pain or headache.    Marland Kitchen aspirin EC 81 MG tablet Take 1 tablet (81 mg total) by mouth daily. 150 tablet 2  . carvedilol (COREG) 25 MG tablet TAKE ONE TABLET BY MOUTH TWICE DAILY    . clopidogrel (PLAVIX) 75 MG tablet Take 1 tablet (75 mg total) by mouth daily. 30 tablet 3  . diphenoxylate-atropine (LOMOTIL) 2.5-0.025 MG tablet Take 1-2 tablets by mouth 4 (four) times daily as needed for diarrhea or loose stools. (Patient not taking: Reported on 02/01/2017) 40 tablet 0  . docusate sodium (COLACE) 100 MG capsule Take 1 capsule (100 mg total) by mouth 2 (two) times daily as needed (take to keep stool soft.). (Patient not taking: Reported on 02/01/2017) 60 capsule 0  . ferrous sulfate 325 (65 FE) MG tablet Take 1 tablet (325 mg total) by mouth daily with breakfast. (Patient not taking: Reported on 02/01/2017) 30 tablet 0  . furosemide (LASIX) 20 MG tablet Take 1 tablet (20 mg total) by mouth daily as needed for fluid.    Marland Kitchen gabapentin (NEURONTIN) 300 MG capsule Take 1 capsule (300 mg total) at bedtime by mouth. 30 capsule 3  . levothyroxine (SYNTHROID, LEVOTHROID) 25 MCG tablet TAKE 1 TABLET EVERY DAY ON EMPTY STOMACHWITH A GLASS OF WATER AT LEAST 30-60 MINBEFORE BREAKFAST    . losartan (COZAAR) 25 MG tablet TAKE ONE TABLET EVERY DAY IN THE MORNING    . nitroGLYCERIN (NITROSTAT) 0.4 MG SL tablet Place 0.4 mg under the tongue every 5 (five) minutes as needed for chest pain.     Marland Kitchen ondansetron (ZOFRAN) 4 MG tablet Take 4 mg by mouth every 8 (eight) hours as needed for nausea or vomiting.     Marland Kitchen oxyCODONE-acetaminophen (PERCOCET/ROXICET) 5-325 MG tablet Take 1 tablet every 8 (eight) hours as needed by mouth for severe  pain. For chronic pain Fill on or after 03/03/17, 04/01/17 90 tablet 0  . silver sulfADIAZINE (SILVADENE) 1 % cream Apply 1 application topically 2 (two) times daily. Apply to area twice a day (Patient taking differently: Apply 1 application topically daily. On ankle) 25 g 2  . vitamin B-12 (CYANOCOBALAMIN) 1000 MCG tablet Take 1,000 mcg by mouth daily.     Current Facility-Administered Medications on File Prior to Visit  Medication Dose Route Frequency Provider  Last Rate Last Dose  . mirabegron ER (MYRBETRIQ) tablet 25 mg  25 mg Oral Daily Hildred Laser, MD       There are no Patient Instructions on file for this visit. No Follow-up on file.  Luceil Herrin A Jayln Branscom, PA-C

## 2017-03-21 DIAGNOSIS — Z4689 Encounter for fitting and adjustment of other specified devices: Secondary | ICD-10-CM | POA: Diagnosis not present

## 2017-03-21 DIAGNOSIS — N898 Other specified noninflammatory disorders of vagina: Secondary | ICD-10-CM | POA: Diagnosis not present

## 2017-03-29 DIAGNOSIS — R0789 Other chest pain: Secondary | ICD-10-CM | POA: Diagnosis not present

## 2017-03-29 DIAGNOSIS — I350 Nonrheumatic aortic (valve) stenosis: Secondary | ICD-10-CM | POA: Diagnosis not present

## 2017-03-29 DIAGNOSIS — I495 Sick sinus syndrome: Secondary | ICD-10-CM | POA: Diagnosis not present

## 2017-03-29 DIAGNOSIS — I10 Essential (primary) hypertension: Secondary | ICD-10-CM | POA: Diagnosis not present

## 2017-03-29 DIAGNOSIS — I482 Chronic atrial fibrillation: Secondary | ICD-10-CM | POA: Diagnosis not present

## 2017-03-29 DIAGNOSIS — I429 Cardiomyopathy, unspecified: Secondary | ICD-10-CM | POA: Diagnosis not present

## 2017-04-06 ENCOUNTER — Telehealth (INDEPENDENT_AMBULATORY_CARE_PROVIDER_SITE_OTHER): Payer: Self-pay | Admitting: Vascular Surgery

## 2017-04-06 NOTE — Telephone Encounter (Signed)
She was placed on Plavix for her PAD and lower extremity intervention on 02/08/17. Is the patient having bloody / dark stools? Red blood in urine? Any chest pain? SOB? Dizziness?

## 2017-04-06 NOTE — Telephone Encounter (Signed)
Patient states she was put on Plavix and now she is getting bruises all over her body. Would like to know if she should could continue to take this medicine she thinks she may have been told it was temporary

## 2017-04-06 NOTE — Telephone Encounter (Signed)
Patient denies any of the symptoms and states she is also taking aspirin.

## 2017-04-08 NOTE — Telephone Encounter (Signed)
I spoke with KS and she want the patient to continue taking Plavix

## 2017-04-21 ENCOUNTER — Encounter: Payer: PPO | Admitting: Student in an Organized Health Care Education/Training Program

## 2017-04-28 ENCOUNTER — Encounter: Payer: PPO | Admitting: Student in an Organized Health Care Education/Training Program

## 2017-04-28 DIAGNOSIS — F4321 Adjustment disorder with depressed mood: Secondary | ICD-10-CM | POA: Diagnosis not present

## 2017-04-28 DIAGNOSIS — E039 Hypothyroidism, unspecified: Secondary | ICD-10-CM | POA: Diagnosis not present

## 2017-04-28 DIAGNOSIS — I872 Venous insufficiency (chronic) (peripheral): Secondary | ICD-10-CM | POA: Diagnosis not present

## 2017-04-28 DIAGNOSIS — S46211D Strain of muscle, fascia and tendon of other parts of biceps, right arm, subsequent encounter: Secondary | ICD-10-CM | POA: Diagnosis not present

## 2017-04-28 DIAGNOSIS — F3341 Major depressive disorder, recurrent, in partial remission: Secondary | ICD-10-CM | POA: Diagnosis not present

## 2017-04-28 DIAGNOSIS — N390 Urinary tract infection, site not specified: Secondary | ICD-10-CM | POA: Diagnosis not present

## 2017-04-28 DIAGNOSIS — R350 Frequency of micturition: Secondary | ICD-10-CM | POA: Diagnosis not present

## 2017-04-28 DIAGNOSIS — I10 Essential (primary) hypertension: Secondary | ICD-10-CM | POA: Diagnosis not present

## 2017-04-28 DIAGNOSIS — E781 Pure hyperglyceridemia: Secondary | ICD-10-CM | POA: Diagnosis not present

## 2017-04-28 DIAGNOSIS — T148XXA Other injury of unspecified body region, initial encounter: Secondary | ICD-10-CM | POA: Diagnosis not present

## 2017-04-29 ENCOUNTER — Telehealth (INDEPENDENT_AMBULATORY_CARE_PROVIDER_SITE_OTHER): Payer: Self-pay | Admitting: Vascular Surgery

## 2017-04-29 ENCOUNTER — Other Ambulatory Visit (INDEPENDENT_AMBULATORY_CARE_PROVIDER_SITE_OTHER): Payer: Self-pay

## 2017-04-29 MED ORDER — SILVER SULFADIAZINE 1 % EX CREA: 1.0000 "application " | TOPICAL_CREAM | Freq: Two times a day (BID) | CUTANEOUS | 2 refills | Status: DC

## 2017-04-29 NOTE — Telephone Encounter (Signed)
New Message   *STAT* If patient is at the pharmacy, call can be transferred to refill team.   1. Which medications need to be refilled? (please list name of each medication and dose if known)  silver sulfadiazine 1% cream, twice daily  2. Which pharmacy/location (including street and city if local pharmacy) is medication to be sent to? [facility] - [place], [place] - [address]  3. Do they need a 30 day or 90 day supply? 30 day supply

## 2017-04-29 NOTE — Telephone Encounter (Signed)
Medication was electroniclly refilled

## 2017-05-01 NOTE — Progress Notes (Signed)
MRN : [id number]  Joanna Roberts is a 84 y.o. ([birth year]) female who presents with chief complaint of No chief complaint on file. Marland Kitchen  History of Present Illness: The patient returns to the office for followup and review of the noninvasive studies. There have been no interval changes in lower extremity symptoms. No interval shortening of the patient's claudication distance or development of rest pain symptoms. No new ulcers or wounds have occurred since the last visit.  She is s/p multilevel left leg intervention on 02/08/2017  She states her left ankle ulcer is healed but she is still putting Silvadene on it daily.  She is noting "terrible bruising" that is very painful and she wants to stop her Plavix.  It is also making her itch uncontrollably   There have been no significant changes to the patient's overall health care.  The patient denies amaurosis fugax or recent TIA symptoms. There are no recent neurological changes noted. The patient denies history of DVT, PE or superficial thrombophlebitis. The patient denies recent episodes of angina or shortness of breath.     No outpatient medications have been marked as taking for the 05/02/17 encounter (Appointment) with Gilda Crease, Latina Craver, MD.   Current Facility-Administered Medications for the 05/02/17 encounter (Appointment) with Gilda Crease, Latina Craver, MD  Medication  . mirabegron ER (MYRBETRIQ) tablet 25 mg    Past Medical History:  Diagnosis Date  . AICD (automatic cardioverter/defibrillator) present   . Allergy   . Anemia   . Arthritis   . Cataract   . CHF (congestive heart failure) (HCC)   . Depression   . Dysrhythmia   . GERD (gastroesophageal reflux disease)   . Heart murmur   . History of TIAs   . Hypertension   . Myocardial infarction (HCC)   . Osteoporosis   . Presence of permanent cardiac pacemaker   . Thyroid disease     Past Surgical History:  Procedure Laterality Date  . ABDOMINAL HYSTERECTOMY    .  APPENDECTOMY    . HERNIA REPAIR    . INSERT / REPLACE / REMOVE PACEMAKER    . LOWER EXTREMITY ANGIOGRAPHY Left 02/08/2017   Procedure: Lower Extremity Angiography;  Surgeon: Renford Dills, MD;  Location: [facility];  Service: Cardiovascular;  Laterality: Left;  . PERIPHERAL VASCULAR CATHETERIZATION Right 10/08/2014   Procedure: Lower Extremity Angiography;  Surgeon: Renford Dills, MD;  Location: [facility];  Service: Cardiovascular;  Laterality: Right;  . PERIPHERAL VASCULAR CATHETERIZATION  10/08/2014   Procedure: Lower Extremity Intervention;  Surgeon: Renford Dills, MD;  Location: [facility];  Service: Cardiovascular;;  . PERIPHERAL VASCULAR CATHETERIZATION N/A 03/23/2016   Procedure: Visceral Angiography;  Surgeon: Renford Dills, MD;  Location: [facility];  Service: Cardiovascular;  Laterality: N/A;  . pessary      Social History Social History   Tobacco Use  . Smoking status: Never Smoker  . Smokeless tobacco: Never Used  Substance Use Topics  . Alcohol use: No  . Drug use: No    Family History Family History  Problem Relation Age of Onset  . Early death Mother   . Heart disease Mother   . Arthritis Father   . Asthma Father   . Stroke Sister     Allergies  Allergen Reactions  . Lortab [Hydrocodone-Acetaminophen] Itching, Swelling and Rash  . Doxycycline Diarrhea  . Macrobid [Nitrofurantoin Monohyd Macro] Diarrhea and Nausea And Vomiting  . Nitrofurantoin Diarrhea,  Nausea Only and Nausea And Vomiting     REVIEW OF SYSTEMS (Negative unless checked)  Constitutional: [] Weight loss  [] Fever  [] Chills Cardiac: [] Chest pain   [] Chest pressure   [] Palpitations   [] Shortness of breath when laying flat   [] Shortness of breath with exertion. Vascular:  [] Pain in legs with walking   [x] Pain in legs at rest  [] History of DVT   [] Phlebitis   [] Swelling in legs   [] Varicose veins   [] Non-healing ulcers Pulmonary:   [] Uses  home oxygen   [] Productive cough   [] Hemoptysis   [] Wheeze  [] COPD   [] Asthma Neurologic:  [] Dizziness   [] Seizures   [] History of stroke   [] History of TIA  [] Aphasia   [] Vissual changes   [] Weakness or numbness in arm   [] Weakness or numbness in leg Musculoskeletal:   [] Joint swelling   [] Joint pain   [] Low back pain Hematologic:  [x] Easy bruising  [] Easy bleeding   [] Hypercoagulable state   [] Anemic Gastrointestinal:  [] Diarrhea   [] Vomiting  [] Gastroesophageal reflux/heartburn   [] Difficulty swallowing. Genitourinary:  [] Chronic kidney disease   [] Difficult urination  [] Frequent urination   [] Blood in urine Skin:  [] Rashes   [] Ulcers  Psychological:  [] History of anxiety   []  History of major depression.  Physical Examination  There were no vitals filed for this visit. There is no height or weight on file to calculate BMI. Gen: WD/WN, NAD Head: [place]/AT, No temporalis wasting.  Ear/Nose/Throat: Hearing grossly intact, nares w/o erythema or drainage Eyes: PER, EOMI, sclera nonicteric.  Neck: Supple, no large masses.   Pulmonary:  Good air movement, no audible wheezing bilaterally, no use of accessory muscles.  Cardiac: RRR, no JVD Vascular: extensive bruising of the right arm Vessel Right Left  Radial Palpable Palpable  PT Not Palpable Not Palpable  DP Not Palpable Not Palpable  Gastrointestinal: Non-distended. No guarding/no peritoneal signs.  Musculoskeletal: M/S 5/5 throughout.  No deformity or atrophy.  Neurologic: CN 2-12 intact. Symmetrical.  Speech is fluent. Motor exam as listed above. Psychiatric: Judgment intact, Mood & affect appropriate for pt's clinical situation. Dermatologic: Extensive venous rashes no ulcers noted.  No changes consistent with cellulitis. Lymph : No lichenification or skin changes of chronic lymphedema.  CBC Lab Results  Component Value Date   WBC 12.1 (H) 08/29/2016   HGB 9.3 (L) 08/29/2016   HCT 22.0 (L) 08/29/2016   MCV 90.5 08/29/2016   PLT  120 (L) 08/29/2016    BMET    Component Value Date/Time   NA 136 08/29/2016 0240   NA 140 04/23/2014 1320   K 4.8 08/29/2016 0240   K 4.8 04/23/2014 1320   CL 106 08/29/2016 0240   CL 109 (H) 04/23/2014 1320   CO2 25 08/29/2016 0240   CO2 26 04/23/2014 1320   GLUCOSE 117 (H) 08/29/2016 0240   GLUCOSE 95 04/23/2014 1320   BUN 23 (H) 02/08/2017 1001   BUN 22 (H) 04/23/2014 1320   CREATININE 1.03 (H) 02/08/2017 1001   CREATININE 1.02 04/23/2014 1320   CALCIUM 8.2 (L) 08/29/2016 0240   CALCIUM 8.5 04/23/2014 1320   GFRNONAA 48 (L) 02/08/2017 1001   GFRNONAA 55 (L) 04/23/2014 1320   GFRNONAA 39 (L) 11/02/2013 0540   GFRAA 55 (L) 02/08/2017 1001   GFRAA >60 04/23/2014 1320   GFRAA 45 (L) 11/02/2013 0540   CrCl cannot be calculated (Patient's most recent lab result is older than the maximum 21 days allowed.).  COAG Lab Results  Component Value  Date   INR 1.23 08/29/2016   INR 1.17 08/28/2016   INR 1.15 08/28/2016    Radiology No results found.  Assessment/Plan 1. Atherosclerosis of native arteries of the extremities with ulceration (HCC) Recommend:  The patient is status post successful angiogram with intervention.  The patient reports that the claudication symptoms and leg pain is essentially gone.   The patient denies lifestyle limiting changes at this point in time.  No further invasive studies, angiography or surgery at this time The patient should continue walking and begin a more formal exercise program.  The patient should continue antiplatelet therapy and aggressive treatment of the lipid abnormalities  OK to stop Plavix The patient should continue wearing graduated compression socks 10-15 mmHg strength to control the mild edema.  Patient should undergo noninvasive studies as ordered. The patient will follow up with me after the studies.    2. Chronic venous insufficiency Ulcer now healed  3. Bilateral carotid artery stenosis Recommend:  Given the  patient's asymptomatic subcritical stenosis no further invasive testing or surgery at this time.  Continue antiplatelet therapy as prescribed Continue management of CAD, HTN and Hyperlipidemia Healthy heart diet,  encouraged exercise at least 4 times per week Follow up in 12 months with duplex ultrasound and physical exam   4. Chronic atrial fibrillation (HCC) Continue antiarrhythmia medications as already ordered, these medications have been reviewed and there are no changes at this time.  Continue anticoagulation as ordered by Cardiology Service     Levora Dredge, MD  05/01/2017 4:46 PM

## 2017-05-02 ENCOUNTER — Ambulatory Visit (INDEPENDENT_AMBULATORY_CARE_PROVIDER_SITE_OTHER): Payer: PPO | Admitting: Vascular Surgery

## 2017-05-02 ENCOUNTER — Encounter (INDEPENDENT_AMBULATORY_CARE_PROVIDER_SITE_OTHER): Payer: Self-pay | Admitting: Vascular Surgery

## 2017-05-02 VITALS — BP 124/80 | HR 78 | Resp 16 | Wt 127.6 lb

## 2017-05-02 DIAGNOSIS — I7025 Atherosclerosis of native arteries of other extremities with ulceration: Secondary | ICD-10-CM | POA: Diagnosis not present

## 2017-05-02 DIAGNOSIS — I482 Chronic atrial fibrillation, unspecified: Secondary | ICD-10-CM

## 2017-05-02 DIAGNOSIS — I6523 Occlusion and stenosis of bilateral carotid arteries: Secondary | ICD-10-CM

## 2017-05-02 DIAGNOSIS — I872 Venous insufficiency (chronic) (peripheral): Secondary | ICD-10-CM | POA: Diagnosis not present

## 2017-05-03 ENCOUNTER — Encounter: Payer: Self-pay | Admitting: Student in an Organized Health Care Education/Training Program

## 2017-05-03 ENCOUNTER — Encounter: Payer: PPO | Admitting: Student in an Organized Health Care Education/Training Program

## 2017-05-03 ENCOUNTER — Ambulatory Visit
Payer: PPO | Attending: Student in an Organized Health Care Education/Training Program | Admitting: Student in an Organized Health Care Education/Training Program

## 2017-05-03 ENCOUNTER — Other Ambulatory Visit: Payer: Self-pay

## 2017-05-03 ENCOUNTER — Ambulatory Visit: Payer: PPO | Admitting: Student in an Organized Health Care Education/Training Program

## 2017-05-03 VITALS — BP 96/49 | HR 74 | Temp 97.9°F | Resp 16 | Ht 64.0 in | Wt 125.0 lb

## 2017-05-03 DIAGNOSIS — M48062 Spinal stenosis, lumbar region with neurogenic claudication: Secondary | ICD-10-CM

## 2017-05-03 DIAGNOSIS — M419 Scoliosis, unspecified: Secondary | ICD-10-CM | POA: Diagnosis not present

## 2017-05-03 DIAGNOSIS — M47816 Spondylosis without myelopathy or radiculopathy, lumbar region: Secondary | ICD-10-CM

## 2017-05-03 DIAGNOSIS — M4854XA Collapsed vertebra, not elsewhere classified, thoracic region, initial encounter for fracture: Secondary | ICD-10-CM

## 2017-05-03 DIAGNOSIS — Z9889 Other specified postprocedural states: Secondary | ICD-10-CM | POA: Insufficient documentation

## 2017-05-03 DIAGNOSIS — Z7982 Long term (current) use of aspirin: Secondary | ICD-10-CM | POA: Diagnosis not present

## 2017-05-03 DIAGNOSIS — Z8673 Personal history of transient ischemic attack (TIA), and cerebral infarction without residual deficits: Secondary | ICD-10-CM | POA: Insufficient documentation

## 2017-05-03 DIAGNOSIS — Z5181 Encounter for therapeutic drug level monitoring: Secondary | ICD-10-CM | POA: Diagnosis not present

## 2017-05-03 DIAGNOSIS — F329 Major depressive disorder, single episode, unspecified: Secondary | ICD-10-CM | POA: Diagnosis not present

## 2017-05-03 DIAGNOSIS — Z90710 Acquired absence of both cervix and uterus: Secondary | ICD-10-CM | POA: Diagnosis not present

## 2017-05-03 DIAGNOSIS — I4891 Unspecified atrial fibrillation: Secondary | ICD-10-CM | POA: Diagnosis not present

## 2017-05-03 DIAGNOSIS — M79601 Pain in right arm: Secondary | ICD-10-CM | POA: Diagnosis not present

## 2017-05-03 DIAGNOSIS — I429 Cardiomyopathy, unspecified: Secondary | ICD-10-CM | POA: Diagnosis not present

## 2017-05-03 DIAGNOSIS — Z881 Allergy status to other antibiotic agents status: Secondary | ICD-10-CM | POA: Insufficient documentation

## 2017-05-03 DIAGNOSIS — M545 Low back pain: Secondary | ICD-10-CM | POA: Insufficient documentation

## 2017-05-03 DIAGNOSIS — I252 Old myocardial infarction: Secondary | ICD-10-CM | POA: Diagnosis not present

## 2017-05-03 DIAGNOSIS — I739 Peripheral vascular disease, unspecified: Secondary | ICD-10-CM | POA: Diagnosis not present

## 2017-05-03 DIAGNOSIS — I509 Heart failure, unspecified: Secondary | ICD-10-CM | POA: Diagnosis not present

## 2017-05-03 DIAGNOSIS — M5116 Intervertebral disc disorders with radiculopathy, lumbar region: Secondary | ICD-10-CM | POA: Diagnosis not present

## 2017-05-03 DIAGNOSIS — M5136 Other intervertebral disc degeneration, lumbar region: Secondary | ICD-10-CM

## 2017-05-03 DIAGNOSIS — M79604 Pain in right leg: Secondary | ICD-10-CM | POA: Diagnosis not present

## 2017-05-03 DIAGNOSIS — Z79899 Other long term (current) drug therapy: Secondary | ICD-10-CM | POA: Insufficient documentation

## 2017-05-03 DIAGNOSIS — M51369 Other intervertebral disc degeneration, lumbar region without mention of lumbar back pain or lower extremity pain: Secondary | ICD-10-CM

## 2017-05-03 DIAGNOSIS — Z79891 Long term (current) use of opiate analgesic: Secondary | ICD-10-CM | POA: Diagnosis not present

## 2017-05-03 MED ORDER — ORPHENADRINE CITRATE 30 MG/ML IJ SOLN
30.0000 mg | Freq: Once | INTRAMUSCULAR | Status: AC
  Administered 2017-05-03: 30 mg via INTRAMUSCULAR
  Filled 2017-05-03: qty 2

## 2017-05-03 MED ORDER — OXYCODONE-ACETAMINOPHEN 5-325 MG PO TABS: 1.0000 | ORAL_TABLET | Freq: Two times a day (BID) | ORAL | 0 refills | Status: DC | PRN

## 2017-05-03 NOTE — Patient Instructions (Addendum)
You have been given 1 script for oxycodone today.  You have a script at the pharmacy that is currently being filled.   1 script was destroyed that was printed

## 2017-05-03 NOTE — Progress Notes (Signed)
Nursing Pain Medication Assessment:  Safety precautions to be maintained throughout the outpatient stay will include: orient to surroundings, keep bed in low position, maintain call bell within reach at all times, provide assistance with transfer out of bed and ambulation.  Medication Inspection Compliance: Pill count conducted under aseptic conditions, in front of the patient. Neither the pills nor the bottle was removed from the patient's sight at any time. Once count was completed pills were immediately returned to the patient in their original bottle.  Medication: Oxycodone/APAP Pill/Patch Count: 2 of 90 pills remain Pill/Patch Appearance: Markings consistent with prescribed medication Bottle Appearance: Standard pharmacy container. Clearly labeled. Filled Date: 65 / 8 / 2018 Last Medication intake:  Today

## 2017-05-03 NOTE — Progress Notes (Signed)
Patient's Name: Joanna Roberts  MRN: [id number]  Referring Provider: Marinda Elk, MD  DOB: 09/22/1928  PCP: Marinda Elk, MD  DOS: 05/03/2017  Note by: Gillis Santa, MD  Service setting: Ambulatory outpatient  Specialty: Interventional Pain Management  Location: [facility] (AMB) Pain Management Facility    Patient type: Established   Primary Reason(s) for Visit: Encounter for prescription drug management. (Level of risk: moderate)  CC: Back Pain (low); Leg Pain (right); and Arm Pain (right)  HPI  Joanna Roberts is a 84 y.o. year old, female patient, who comes today for a medication management evaluation. She has Pneumonia; A-fib ([place]); Acquired hypothyroidism; Atrial fibrillation ([place]); Cardiomyopathy ([place]); Chronic abdominal pain; Chronic anemia; Back pain, chronic; CN (constipation); DD (diverticular disease); Fatty infiltration of liver; Aggrieved; Bergmann's syndrome; History of colon polyps; Personal history of urinary infection; History of urinary anomaly; Incomplete bladder emptying; Cannot sleep; Frequent UTI; Esophagitis, reflux; Pain in rib; Tachycardia-bradycardia ([place]); Temporary cerebral vascular dysfunction; FOM (frequency of micturition); B12 deficiency; DDD (degenerative disc disease), lumbar; Facet syndrome, lumbar; Sacroiliac joint dysfunction; Spinal stenosis, lumbar region, with neurogenic claudication; Lumbar radiculopathy; Compression fracture of thoracic spine, non-traumatic ([place]); Idiopathic scoliosis; H/O varicose vein stripping; Chronic venous insufficiency; Chronic mesenteric ischemia ([place]); Hematoma of leg, right, initial encounter; Acute blood loss anemia; Sepsis ([place]); Protein-calorie malnutrition, severe; Lymphedema; Varicose veins of both lower extremities; Atherosclerosis of native arteries of the extremities with ulceration ([place]); Ankle ulcer ([place]); and Carotid stenosis on their problem list. Her primarily concern today is the Back Pain (low); Leg Pain (right); and Arm  Pain (right)  Pain Assessment: Location: Lower, Right Back(arm, leg) Radiating:   Onset: More than a month ago Duration: Chronic pain Quality: Aching, Constant Severity: 7 /10 (self-reported pain score)  Note: Reported level is inconsistent with clinical observations. Clinically the patient looks like a 3/10 A 3/10 is viewed as "Moderate" and described as significantly interfering with activities of daily living (ADL). It becomes difficult to feed, bathe, get dressed, get on and off the toilet or to perform personal hygiene functions. Difficult to get in and out of bed or a chair without assistance. Very distracting. With effort, it can be ignored when deeply involved in activities.       When using our objective Pain Scale, levels between 6 and 10/10 are said to belong in an emergency room, as it progressively worsens from a 6/10, described as severely limiting, requiring emergency care not usually available at an outpatient pain management facility. At a 6/10 level, communication becomes difficult and requires great effort. Assistance to reach the emergency department may be required. Facial flushing and profuse sweating along with potentially dangerous increases in heart rate and blood pressure will be evident. Effect on ADL:   Timing: Constant Modifying factors: nothing  Joanna Roberts was last scheduled for an appointment on 03/03/2017 for medication management. During today's appointment we reviewed Joanna Roberts's chronic pain status, as well as her outpatient medication regimen.  Continues to endorse right arm pain that severely limits her ROM and her activities of daily living.  This is secondary to rupture of right distal biceps tendon.  She has been seen by Dr. Arvella Nigh with orthopedic surgery who recommended conservative management.  She does have an upcoming appointment.  Her last visit Percocet was increased from 5 mg twice daily to 3 times daily given worsening right biceps pain.  Patient was unable  to get her second prescription filled and has used 1 prescription over the course of  2 months.  She has been taking 1-1.5 tablets daily.  Will refill her oxycodone back at previous dose of 5 mill grams twice daily as needed.  The patient  reports that she does not use drugs. Her body mass index is 21.46 kg/m.  Further details on both, my assessment(s), as well as the proposed treatment plan, please see below.  Controlled Substance Pharmacotherapy Assessment REMS (Risk Evaluation and Mitigation Strategy)  Analgesic: Oxycodone 5 mg BID prn, #60 MME/day: 15 mg/day.  Joanna Roberts  05/03/2017  9:30 AM  Signed Nursing Pain Medication Assessment:  Safety precautions to be maintained throughout the outpatient stay will include: orient to surroundings, keep bed in low position, maintain call bell within reach at all times, provide assistance with transfer out of bed and ambulation.  Medication Inspection Compliance: Pill count conducted under aseptic conditions, in front of the patient. Neither the pills nor the bottle was removed from the patient's sight at any time. Once count was completed pills were immediately returned to the patient in their original bottle.  Medication: Oxycodone/APAP Pill/Patch Count: 2 of 90 pills remain Pill/Patch Appearance: Markings consistent with prescribed medication Bottle Appearance: Standard pharmacy container. Clearly labeled. Filled Date: 38 / 8 / 2018 Last Medication intake:  Today   Pharmacokinetics: Liberation and absorption (onset of action): WNL Distribution (time to peak effect): WNL Metabolism and excretion (duration of action): WNL         Pharmacodynamics: Desired effects: Analgesia: Joanna Roberts reports >50% benefit. Functional ability: Patient reports that medication allows her to accomplish basic ADLs Clinically meaningful improvement in function (CMIF): Sustained CMIF goals met Perceived effectiveness: Described as relatively effective, allowing  for increase in activities of daily living (ADL) Undesirable effects: Side-effects or Adverse reactions: None reported Monitoring: [place] PMP: Online review of the past 87-monthperiod conducted. Compliant with practice rules and regulations Last UDS on record: Summary  Date Value Ref Range Status  12/09/2016 FINAL  Final    Comment:    ==================================================================== TOXASSURE COMP DRUG ANALYSIS,UR ==================================================================== Test                             Result       Flag       Units Drug Present and Declared for Prescription Verification   Oxazepam                       551          EXPECTED   ng/mg creat   Temazepam                      >2105        EXPECTED   ng/mg creat    Oxazepam and temazepam are expected metabolites of diazepam.    Oxazepam is also an expected metabolite of other benzodiazepine    drugs, including chlordiazepoxide, prazepam, clorazepate,    halazepam, and temazepam.  Oxazepam and temazepam are available    as scheduled prescription medications.   Oxycodone                      1667         EXPECTED   ng/mg creat   Oxymorphone                    697          EXPECTED  ng/mg creat   Noroxycodone                   1253         EXPECTED   ng/mg creat   Noroxymorphone                 118          EXPECTED   ng/mg creat    Sources of oxycodone are scheduled prescription medications.    Oxymorphone, noroxycodone, and noroxymorphone are expected    metabolites of oxycodone. Oxymorphone is also available as a    scheduled prescription medication.   Gabapentin                     PRESENT      EXPECTED   Acetaminophen                  PRESENT      EXPECTED Drug Absent but Declared for Prescription Verification   Hydrocodone                    Not Detected UNEXPECTED ng/mg creat   Diclofenac                     Not Detected UNEXPECTED    Diclofenac, as indicated in the declared medication  list, is not    always detected even when used as directed. ==================================================================== Test                      Result    Flag   Units      Ref Range   Creatinine              95               mg/dL      >=20 ==================================================================== Declared Medications:  The flagging and interpretation on this report are based on the  following declared medications.  Unexpected results may arise from  inaccuracies in the declared medications.  **Note: The testing scope of this panel includes these medications:  Gabapentin  Hydrocodone (Hydrocodone-Acetaminophen)  Oxycodone  Oxycodone (Oxycodone Acetaminophen)  Temazepam  **Note: The testing scope of this panel does not include small to  moderate amounts of these reported medications:  Acetaminophen (Hydrocodone-Acetaminophen)  Acetaminophen (Oxycodone Acetaminophen)  Diclofenac  **Note: The testing scope of this panel does not include following  reported medications:  Amoxicillin (Augmentin)  Atropine (Lomotil)  Budesonide  Carvedilol  Ciprofloxacin  Clavulinate (Augmentin)  Cyanocobalamin  Diphenoxylate (Lomotil)  Docusate (Colace)  Ferrous Gluconate  Furosemide  Levothyroxine  Loperamide  Losartan (Losartan Potassium)  Metronidazole  Nitroglycerin  Ondansetron  Prednisone  Silver  Sulfamethoxazole (Trimethoprim-Sulfamethoxazole)  Trimethoprim (Trimethoprim-Sulfamethoxazole) ==================================================================== For clinical consultation, please call [phone]. ====================================================================    UDS interpretation: Compliant          Medication Assessment Form: Reviewed. Patient indicates being compliant with therapy Treatment compliance: Compliant Risk Assessment Profile: Aberrant behavior: See prior evaluations. None observed or detected today Comorbid factors  increasing risk of overdose: See prior notes. No additional risks detected today Risk of substance use disorder (SUD): Low Opioid Risk Tool - 05/03/17 0929      Family History of Substance Abuse   Alcohol  Negative    Illegal Drugs  Negative    Rx Drugs  Negative      Personal History of Substance Abuse   Alcohol  Negative  Illegal Drugs  Negative    Rx Drugs  Negative      Age   Age between 75-45 years   No      History of Preadolescent Sexual Abuse   History of Preadolescent Sexual Abuse  Negative or Female      Psychological Disease   Psychological Disease  Negative    Depression  Negative      Total Score   Opioid Risk Tool Scoring  0    Opioid Risk Interpretation  Low Risk      ORT Scoring interpretation table:  Score <3 = Low Risk for SUD  Score between 4-7 = Moderate Risk for SUD  Score >8 = High Risk for Opioid Abuse   Risk Mitigation Strategies:  Patient Counseling: Covered Patient-Prescriber Agreement (PPA): Present and active  Notification to other healthcare providers: Done  Pharmacologic Plan: No change in therapy, at this time.             Laboratory Chemistry  Inflammation Markers (CRP: Acute Phase) (ESR: Chronic Phase) Lab Results  Component Value Date   LATICACIDVEN 1.4 06/01/2016                 Rheumatology Markers No results found for: RF, ANA, LABURIC, URICUR, LYMEIGGIGMAB, LYMEABIGMQN              Renal Function Markers Lab Results  Component Value Date   BUN 23 (H) 02/08/2017   CREATININE 1.03 (H) 02/08/2017   GFRAA 55 (L) 02/08/2017   GFRNONAA 48 (L) 02/08/2017                 Hepatic Function Markers Lab Results  Component Value Date   AST 28 06/02/2016   ALT 15 06/02/2016   ALBUMIN 2.7 (L) 06/02/2016   ALKPHOS 30 (L) 06/02/2016   LIPASE 20 03/09/2016                 Electrolytes Lab Results  Component Value Date   NA 136 08/29/2016   K 4.8 08/29/2016   CL 106 08/29/2016   CALCIUM 8.2 (L) 08/29/2016   MG 1.7  03/09/2016                 Neuropathy Markers No results found for: VITAMINB12, FOLATE, HGBA1C, HIV               Bone Pathology Markers No results found for: VD25OH, VD125OH2TOT, HU3149FW2, OV7858IF0, 25OHVITD1, 25OHVITD2, 25OHVITD3, TESTOFREE, TESTOSTERONE               Coagulation Parameters Lab Results  Component Value Date   INR 1.23 08/29/2016   LABPROT 15.6 (H) 08/29/2016   APTT 38 (H) 08/27/2016   PLT 120 (L) 08/29/2016                 Cardiovascular Markers Lab Results  Component Value Date   TROPONINI <0.03 08/29/2016   HGB 9.3 (L) 08/29/2016   HCT 22.0 (L) 08/29/2016                 CA Markers No results found for: CEA, CA125, LABCA2               Note: Lab results reviewed.  Recent Diagnostic Imaging Results  PERIPHERAL VASCULAR CATHETERIZATION See op note  Complexity Note: Imaging results reviewed. Results shared with Ms. Zenker, using Layman's terms.  Meds   Current Outpatient Medications:  .  acetaminophen (TYLENOL) 500 MG tablet, Take 1,000 mg by mouth every 8 (eight) hours as needed for mild pain or headache., Disp: , Rfl:  .  aspirin EC 81 MG tablet, Take 1 tablet (81 mg total) by mouth daily., Disp: 150 tablet, Rfl: 2 .  carvedilol (COREG) 25 MG tablet, TAKE ONE TABLET BY MOUTH TWICE DAILY, Disp: , Rfl:  .  furosemide (LASIX) 20 MG tablet, Take 1 tablet (20 mg total) by mouth daily as needed for fluid., Disp: , Rfl:  .  gabapentin (NEURONTIN) 300 MG capsule, Take 1 capsule (300 mg total) at bedtime by mouth., Disp: 30 capsule, Rfl: 3 .  levothyroxine (SYNTHROID, LEVOTHROID) 25 MCG tablet, TAKE 1 TABLET EVERY DAY ON EMPTY STOMACHWITH A GLASS OF WATER AT LEAST 30-60 MINBEFORE BREAKFAST, Disp: , Rfl:  .  losartan (COZAAR) 25 MG tablet, TAKE ONE TABLET EVERY DAY IN THE MORNING, Disp: , Rfl:  .  nitroGLYCERIN (NITROSTAT) 0.4 MG SL tablet, Place 0.4 mg under the tongue every 5 (five) minutes as needed for chest pain. , Disp: , Rfl:  .   ondansetron (ZOFRAN) 4 MG tablet, Take 4 mg by mouth every 8 (eight) hours as needed for nausea or vomiting. , Disp: , Rfl:  .  oxyCODONE-acetaminophen (PERCOCET/ROXICET) 5-325 MG tablet, Take 1 tablet by mouth 2 (two) times daily as needed for severe pain. For chronic pain Fill on or after 05/03/2017, 06/02/2017, Disp: 60 tablet, Rfl: 0 .  silver sulfADIAZINE (SILVADENE) 1 % cream, Apply 1 application topically 2 (two) times daily. Apply to area twice a day, Disp: 25 g, Rfl: 2 .  vitamin B-12 (CYANOCOBALAMIN) 1000 MCG tablet, Take 1,000 mcg by mouth daily., Disp: , Rfl:   Current Facility-Administered Medications:  .  mirabegron ER (MYRBETRIQ) tablet 25 mg, 25 mg, Oral, Daily, Rubie Maid, MD  ROS  Constitutional: Denies any fever or chills Gastrointestinal: No reported hemesis, hematochezia, vomiting, or acute GI distress Musculoskeletal: Denies any acute onset joint swelling, redness, loss of ROM, or weakness Neurological: No reported episodes of acute onset apraxia, aphasia, dysarthria, agnosia, amnesia, paralysis, loss of coordination, or loss of consciousness  Allergies  Ms. Anastacio is allergic to lortab [hydrocodone-acetaminophen]; doxycycline; macrobid [nitrofurantoin monohyd macro]; and nitrofurantoin.  PFSH  Drug: Ms. Kosier  reports that she does not use drugs. Alcohol:  reports that she does not drink alcohol. Tobacco:  reports that  has never smoked. she has never used smokeless tobacco. Medical:  has a past medical history of AICD (automatic cardioverter/defibrillator) present, Allergy, Anemia, Arthritis, Cataract, CHF (congestive heart failure) ([place]), Depression, Dysrhythmia, GERD (gastroesophageal reflux disease), Heart murmur, History of TIAs, Hypertension, Myocardial infarction ([place]), Osteoporosis, Presence of permanent cardiac pacemaker, and Thyroid disease. Surgical: Ms. Yott  has a past surgical history that includes Hernia repair; Insert / replace / remove pacemaker;  Appendectomy; Abdominal hysterectomy; Cardiac catheterization (Right, 10/08/2014); Cardiac catheterization (10/08/2014); Cardiac catheterization (N/A, 03/23/2016); pessary; and Lower Extremity Angiography (Left, 02/08/2017). Family: family history includes Arthritis in her father; Asthma in her father; Early death in her mother; Heart disease in her mother; Stroke in her sister.  Constitutional Exam  General appearance: Well nourished, well developed, and well hydrated. In no apparent acute distress Vitals:   05/03/17 0922  BP: (!) 96/49  Pulse: 74  Resp: 16  Temp: 97.9 F (36.6 C)  SpO2: 99%  Weight: 125 lb (56.7 kg)  Height: _0  (1.626 m)   BMI Assessment: Estimated  body mass index is 21.46 kg/m as calculated from the following:   Height as of this encounter: _0  (1.626 m).   Weight as of this encounter: 125 lb (56.7 kg).  BMI interpretation table: BMI level Category Range association with higher incidence of chronic pain  <18 kg/m2 Underweight   18.5-24.9 kg/m2 Ideal body weight   25-29.9 kg/m2 Overweight Increased incidence by 20%  30-34.9 kg/m2 Obese (Class I) Increased incidence by 68%  35-39.9 kg/m2 Severe obesity (Class II) Increased incidence by 136%  >40 kg/m2 Extreme obesity (Class III) Increased incidence by 254%   BMI Readings from Last 4 Encounters:  05/03/17 21.46 kg/m  05/02/17 22.60 kg/m  03/16/17 22.14 kg/m  03/03/17 21.46 kg/m   Wt Readings from Last 4 Encounters:  05/03/17 125 lb (56.7 kg)  05/02/17 127 lb 9.6 oz (57.9 kg)  03/16/17 125 lb (56.7 kg)  03/03/17 125 lb (56.7 kg)  Psych/Mental status: Alert, oriented x 3 (person, place, & time)       Eyes: PERLA Respiratory: No evidence of acute respiratory distress Cervical Spine Area Exam  Skin & Axial Inspection:Paravertebral muscle atrophy Alignment:Asymmetric Functional WER:[id number] ROM Stability:No instability detected Muscle Tone/Strength:Functionally intact. No obvious  neuro-muscular anomalies detected. Sensory (Neurological):Movement-associated pain Palpation:Complains of area being tender to palpation         Upper Extremity (UE) Exam    Side:Right upper extremity  Side:Left upper extremity   Skin & Extremity Inspection:Positive color changes  Skin & Extremity Inspection:Positive color changes   Functional PPJ:[id number] ROM  Functional DXI:[id number] ROM   Muscle Tone/Strength:Mild-to-medarate deconditioning  Muscle Tone/Strength:Moderate-to-severe deconditioning   Sensory (Neurological):Movement-associated pain  Sensory (Neurological):Movement-associated pain   Palpation:Tender to palpation along right proximal bicep  Palpation:No palpable anomalies   Specialized Test(s):Deferred  Specialized Test(s):Deferred   Right upper extremity strength: Elbow flexion, elbow extension 4 out of 5. Patient unable to AB duct right arm secondary to shoulder pain. Thoracic Spine Area Exam  Skin & Axial Inspection:Significant thoracic kyphosis Alignment:Asymmetric Functional HAL:[id number] ROM Stability:No instability detected Muscle Tone/Strength:Functionally intact. No obvious neuro-muscular anomalies detected. Sensory (Neurological):Movement associated pain Muscle strength & Tone:Complains of area being tender to palpationoverlying thoracic vertebra  Lumbar Spine Area Exam  Skin & Axial Inspection:Thoraco-lumbar Scoliosis Alignment:Asymmetric Functional BDZ:[id number] ROM Stability:No instability detected Muscle Tone/Strength:Functionally intact. No obvious neuro-muscular anomalies detected. Sensory (Neurological):Movement-associated pain Palpation:Tender Provocative Tests: Lumbar Hyperextension and rotation test:Positive Lumbar Lateral bending test:Positive Patrick's Maneuver:evaluation deferred  today  Gait & Posture Assessment  Ambulation:Patient ambulates using a walker Gait:Age-related, senile gait pattern Posture:Thoracic kyphosis  Lower Extremity Exam    Side:Right lower extremity  Side:Left lower extremity  Skin & Extremity Inspection:Positive color changes, vascular changes noted, mild venous stasis edema   Skin & Extremity Inspection:Positive color changes  Functional TMH:[id number] ROM  Functional XQJ:[id number] ROM  Muscle Tone/Strength:Functionally intact. No obvious neuro-muscular anomalies detected.  Muscle Tone/Strength:Functionally intact. No obvious neuro-muscular anomalies detected.  Sensory (Neurological):Movement-associated pain  Sensory (Neurological):Unimpaired  Palpation:Complains of area being tender to palpation     Palpation:No palpable anomalies  Palpation:No palpable anomalies     Assessment  Primary Diagnosis & Pertinent Problem List: The primary encounter diagnosis was DDD (degenerative disc disease), lumbar. Diagnoses of Facet syndrome, lumbar, Non-traumatic compression fracture of twelfth thoracic vertebra, initial encounter [facility]), Spinal stenosis, lumbar region, with neurogenic claudication, Compression fracture of thoracic spine, non-traumatic, initial encounter ([place]), and Right arm pain were also pertinent to this visit.  Status Diagnosis  Persistent Persistent Persistent 1. DDD (degenerative disc disease), lumbar  2. Facet syndrome, lumbar   3. Non-traumatic compression fracture of twelfth thoracic vertebra, initial encounter ([place])   4. Spinal stenosis, lumbar region, with neurogenic claudication   5. Compression fracture of thoracic spine, non-traumatic, initial encounter ([place])   6. Right arm pain      Patient is a very pleasant 84 year old female with a complex and extensive past medical history including cardiac history significant for atrial fibrillation,  cardiomyopathy, peripheral vascular disease who is referred for evaluation of low back pain that is present on both sides with radiation to her right knee in a posterior dermatomal distribution involving the S1-S2 dermatomes primarily. Patient's low back pain which radiates to the right leg is multifactorial secondary to severe scoliosis, severe degenerative lumbar spondylosis with multilevel facetogenic disease, compression fractures of T12 and L4, and moderate spinal canal stenosis and lateral recess stenosis at L2-L3 and at L4-L5. Patient does have symptoms of spinal stenosis with neurogenic claudication.In terms of her medication regimen, patient takes gabapentin 300 mg daily at bedtime along with oxycodone 5 mg 2-3 times a day as needed for severe pain. Patient is low risk for abuse/misuse of chronic opioid therapy.  Patient continues to have right distal biceps tendon pain.  This is slightly improved since her previous appointment with me in early November.  She does have limited range of motion and has difficulty flexing her right arm and abductor in her right arm as well secondary to pain.  I have recommended that she follow-up with orthopedic surgery since she last saw them October 30 but is continuing to have significant right bicep pain and range of motion.  I have encouraged the patient to continue with her right bicep exercises and range of motion exercises.  I will refill her oxycodone as below.  She is instructed to continue her gabapentin as previously prescribed.  I will also give her an intramuscular injection of 30 mg of IM Norflex to help out with her myofascial pain.  Plan: -30 mg of IM Norflex today -Prescription for Percocet as below -Continue gabapentin as prescribed -Follow-up with orthopedic surgery regarding right biceps tendinopathy. -Follow-up in 2 months.  Plan of Care  Pharmacotherapy (Medications Ordered): Meds ordered this encounter  Medications  . DISCONTD:  oxyCODONE-acetaminophen (PERCOCET/ROXICET) 5-325 MG tablet    Sig: Take 1 tablet by mouth 2 (two) times daily as needed for severe pain. For chronic pain Fill on or after 05/03/2017, 06/02/2017    Dispense:  60 tablet    Refill:  0    To last for 30 days from fill date  . oxyCODONE-acetaminophen (PERCOCET/ROXICET) 5-325 MG tablet    Sig: Take 1 tablet by mouth 2 (two) times daily as needed for severe pain. For chronic pain Fill on or after 05/03/2017, 06/02/2017    Dispense:  60 tablet    Refill:  0    To last for 30 days from fill date  . orphenadrine (NORFLEX) injection 30 mg   Time Note: Greater than 50% of the 25 minute(s) of face-to-face time spent with Ms. Lowden, was spent in counseling/coordination of care regarding: Ms. Bradham primary cause of pain, the treatment plan and treatment alternatives.  Provider-requested follow-up: Return in about 8 weeks (around 06/28/2017) for Medication Management.  Future Appointments  Date Time Provider [place]  06/28/2017  2:30 PM Gillis Santa, MD [facility] None  07/18/2017  2:30 PM [facility] None  07/18/2017  3:30 PM Schnier, Dolores Lory, MD [facility] None  09/20/2017 10:30 AM  [facility] 3 [facility] None  09/20/2017 11:15 AM [facility] 3 [facility] None  09/20/2017 11:45 AM Stegmayer, Janalyn Harder, PA-C [facility] None  11/03/2017  9:00 AM [facility] 1 [facility] None  11/03/2017 10:00 AM Schnier, Dolores Lory, MD [facility] None    Primary Care Physician: Marinda Elk, MD Location: [facility] Note by: Gillis Santa, M.D Date: 05/03/2017; Time: 10:05 AM  Patient Instructions  You have been given 1 script for oxycodone today.  You have a script at the pharmacy that is currently being filled.   1 script was destroyed that was printed

## 2017-05-04 ENCOUNTER — Encounter: Payer: PPO | Admitting: Student in an Organized Health Care Education/Training Program

## 2017-05-05 ENCOUNTER — Encounter: Payer: PPO | Admitting: Student in an Organized Health Care Education/Training Program

## 2017-05-06 ENCOUNTER — Telehealth (INDEPENDENT_AMBULATORY_CARE_PROVIDER_SITE_OTHER): Payer: Self-pay

## 2017-05-06 NOTE — Telephone Encounter (Signed)
Called the patient back and gave her instructions as to what to do for her current issue. Had to leave her a message with those instructions.

## 2017-05-06 NOTE — Telephone Encounter (Signed)
The patient's itching is not from discontinuing Plavix.  Patient should continue taking Benadryl.  She might want to purchase the Benadryl cream and apply that to her skin.  The patient needs to follow-up with her primary care physician to find out what she may be allergic to.

## 2017-05-06 NOTE — Telephone Encounter (Signed)
Patient says she is breaking out and itching all over her arms, with bruising underneath. She was told on Monday by Dr. Gilda Crease that she could discontinue the Plavix. She states that she has been taking Benadryl to help the itching to subside, but it's not working.   She wants to know if there is anything that can be prescribed outside of the Benadryl to help to get the itching under control?   [facility]

## 2017-05-09 DIAGNOSIS — R21 Rash and other nonspecific skin eruption: Secondary | ICD-10-CM | POA: Diagnosis not present

## 2017-05-12 DIAGNOSIS — L239 Allergic contact dermatitis, unspecified cause: Secondary | ICD-10-CM | POA: Diagnosis not present

## 2017-05-17 DIAGNOSIS — I350 Nonrheumatic aortic (valve) stenosis: Secondary | ICD-10-CM | POA: Diagnosis not present

## 2017-05-19 DIAGNOSIS — B078 Other viral warts: Secondary | ICD-10-CM | POA: Diagnosis not present

## 2017-05-19 DIAGNOSIS — L308 Other specified dermatitis: Secondary | ICD-10-CM | POA: Diagnosis not present

## 2017-05-19 DIAGNOSIS — I8310 Varicose veins of unspecified lower extremity with inflammation: Secondary | ICD-10-CM | POA: Diagnosis not present

## 2017-05-19 DIAGNOSIS — B353 Tinea pedis: Secondary | ICD-10-CM | POA: Diagnosis not present

## 2017-05-19 DIAGNOSIS — L853 Xerosis cutis: Secondary | ICD-10-CM | POA: Diagnosis not present

## 2017-05-24 DIAGNOSIS — M19011 Primary osteoarthritis, right shoulder: Secondary | ICD-10-CM | POA: Diagnosis not present

## 2017-05-24 DIAGNOSIS — M12811 Other specific arthropathies, not elsewhere classified, right shoulder: Secondary | ICD-10-CM | POA: Diagnosis not present

## 2017-05-26 DIAGNOSIS — F419 Anxiety disorder, unspecified: Secondary | ICD-10-CM | POA: Diagnosis not present

## 2017-05-26 DIAGNOSIS — F5101 Primary insomnia: Secondary | ICD-10-CM | POA: Diagnosis not present

## 2017-06-03 DIAGNOSIS — E039 Hypothyroidism, unspecified: Secondary | ICD-10-CM | POA: Diagnosis not present

## 2017-06-03 DIAGNOSIS — M19011 Primary osteoarthritis, right shoulder: Secondary | ICD-10-CM | POA: Diagnosis not present

## 2017-06-03 DIAGNOSIS — Z9181 History of falling: Secondary | ICD-10-CM | POA: Diagnosis not present

## 2017-06-03 DIAGNOSIS — I739 Peripheral vascular disease, unspecified: Secondary | ICD-10-CM | POA: Diagnosis not present

## 2017-06-03 DIAGNOSIS — F329 Major depressive disorder, single episode, unspecified: Secondary | ICD-10-CM | POA: Diagnosis not present

## 2017-06-03 DIAGNOSIS — I509 Heart failure, unspecified: Secondary | ICD-10-CM | POA: Diagnosis not present

## 2017-06-03 DIAGNOSIS — I4891 Unspecified atrial fibrillation: Secondary | ICD-10-CM | POA: Diagnosis not present

## 2017-06-03 DIAGNOSIS — I429 Cardiomyopathy, unspecified: Secondary | ICD-10-CM | POA: Diagnosis not present

## 2017-06-03 DIAGNOSIS — K5790 Diverticulosis of intestine, part unspecified, without perforation or abscess without bleeding: Secondary | ICD-10-CM | POA: Diagnosis not present

## 2017-06-03 DIAGNOSIS — M75121 Complete rotator cuff tear or rupture of right shoulder, not specified as traumatic: Secondary | ICD-10-CM | POA: Diagnosis not present

## 2017-06-06 DIAGNOSIS — I429 Cardiomyopathy, unspecified: Secondary | ICD-10-CM | POA: Diagnosis not present

## 2017-06-06 DIAGNOSIS — K5790 Diverticulosis of intestine, part unspecified, without perforation or abscess without bleeding: Secondary | ICD-10-CM | POA: Diagnosis not present

## 2017-06-06 DIAGNOSIS — I4891 Unspecified atrial fibrillation: Secondary | ICD-10-CM | POA: Diagnosis not present

## 2017-06-06 DIAGNOSIS — E039 Hypothyroidism, unspecified: Secondary | ICD-10-CM | POA: Diagnosis not present

## 2017-06-06 DIAGNOSIS — I739 Peripheral vascular disease, unspecified: Secondary | ICD-10-CM | POA: Diagnosis not present

## 2017-06-06 DIAGNOSIS — F329 Major depressive disorder, single episode, unspecified: Secondary | ICD-10-CM | POA: Diagnosis not present

## 2017-06-06 DIAGNOSIS — Z9181 History of falling: Secondary | ICD-10-CM | POA: Diagnosis not present

## 2017-06-06 DIAGNOSIS — M19011 Primary osteoarthritis, right shoulder: Secondary | ICD-10-CM | POA: Diagnosis not present

## 2017-06-06 DIAGNOSIS — M75121 Complete rotator cuff tear or rupture of right shoulder, not specified as traumatic: Secondary | ICD-10-CM | POA: Diagnosis not present

## 2017-06-06 DIAGNOSIS — I509 Heart failure, unspecified: Secondary | ICD-10-CM | POA: Diagnosis not present

## 2017-06-14 DIAGNOSIS — I509 Heart failure, unspecified: Secondary | ICD-10-CM | POA: Diagnosis not present

## 2017-06-14 DIAGNOSIS — Z9181 History of falling: Secondary | ICD-10-CM | POA: Diagnosis not present

## 2017-06-14 DIAGNOSIS — I429 Cardiomyopathy, unspecified: Secondary | ICD-10-CM | POA: Diagnosis not present

## 2017-06-14 DIAGNOSIS — M19011 Primary osteoarthritis, right shoulder: Secondary | ICD-10-CM | POA: Diagnosis not present

## 2017-06-14 DIAGNOSIS — F329 Major depressive disorder, single episode, unspecified: Secondary | ICD-10-CM | POA: Diagnosis not present

## 2017-06-14 DIAGNOSIS — E039 Hypothyroidism, unspecified: Secondary | ICD-10-CM | POA: Diagnosis not present

## 2017-06-14 DIAGNOSIS — K5790 Diverticulosis of intestine, part unspecified, without perforation or abscess without bleeding: Secondary | ICD-10-CM | POA: Diagnosis not present

## 2017-06-14 DIAGNOSIS — M75121 Complete rotator cuff tear or rupture of right shoulder, not specified as traumatic: Secondary | ICD-10-CM | POA: Diagnosis not present

## 2017-06-14 DIAGNOSIS — I739 Peripheral vascular disease, unspecified: Secondary | ICD-10-CM | POA: Diagnosis not present

## 2017-06-14 DIAGNOSIS — I4891 Unspecified atrial fibrillation: Secondary | ICD-10-CM | POA: Diagnosis not present

## 2017-06-20 DIAGNOSIS — I4891 Unspecified atrial fibrillation: Secondary | ICD-10-CM | POA: Diagnosis not present

## 2017-06-20 DIAGNOSIS — I509 Heart failure, unspecified: Secondary | ICD-10-CM | POA: Diagnosis not present

## 2017-06-20 DIAGNOSIS — I429 Cardiomyopathy, unspecified: Secondary | ICD-10-CM | POA: Diagnosis not present

## 2017-06-20 DIAGNOSIS — M75121 Complete rotator cuff tear or rupture of right shoulder, not specified as traumatic: Secondary | ICD-10-CM | POA: Diagnosis not present

## 2017-06-20 DIAGNOSIS — E039 Hypothyroidism, unspecified: Secondary | ICD-10-CM | POA: Diagnosis not present

## 2017-06-20 DIAGNOSIS — K5790 Diverticulosis of intestine, part unspecified, without perforation or abscess without bleeding: Secondary | ICD-10-CM | POA: Diagnosis not present

## 2017-06-20 DIAGNOSIS — Z9181 History of falling: Secondary | ICD-10-CM | POA: Diagnosis not present

## 2017-06-20 DIAGNOSIS — M19011 Primary osteoarthritis, right shoulder: Secondary | ICD-10-CM | POA: Diagnosis not present

## 2017-06-20 DIAGNOSIS — I739 Peripheral vascular disease, unspecified: Secondary | ICD-10-CM | POA: Diagnosis not present

## 2017-06-20 DIAGNOSIS — F329 Major depressive disorder, single episode, unspecified: Secondary | ICD-10-CM | POA: Diagnosis not present

## 2017-06-22 DIAGNOSIS — Z9181 History of falling: Secondary | ICD-10-CM | POA: Diagnosis not present

## 2017-06-22 DIAGNOSIS — M19011 Primary osteoarthritis, right shoulder: Secondary | ICD-10-CM | POA: Diagnosis not present

## 2017-06-22 DIAGNOSIS — F329 Major depressive disorder, single episode, unspecified: Secondary | ICD-10-CM | POA: Diagnosis not present

## 2017-06-22 DIAGNOSIS — M75121 Complete rotator cuff tear or rupture of right shoulder, not specified as traumatic: Secondary | ICD-10-CM | POA: Diagnosis not present

## 2017-06-22 DIAGNOSIS — E039 Hypothyroidism, unspecified: Secondary | ICD-10-CM | POA: Diagnosis not present

## 2017-06-22 DIAGNOSIS — I739 Peripheral vascular disease, unspecified: Secondary | ICD-10-CM | POA: Diagnosis not present

## 2017-06-22 DIAGNOSIS — I4891 Unspecified atrial fibrillation: Secondary | ICD-10-CM | POA: Diagnosis not present

## 2017-06-22 DIAGNOSIS — I509 Heart failure, unspecified: Secondary | ICD-10-CM | POA: Diagnosis not present

## 2017-06-22 DIAGNOSIS — K5790 Diverticulosis of intestine, part unspecified, without perforation or abscess without bleeding: Secondary | ICD-10-CM | POA: Diagnosis not present

## 2017-06-22 DIAGNOSIS — I429 Cardiomyopathy, unspecified: Secondary | ICD-10-CM | POA: Diagnosis not present

## 2017-06-27 DIAGNOSIS — F329 Major depressive disorder, single episode, unspecified: Secondary | ICD-10-CM | POA: Diagnosis not present

## 2017-06-27 DIAGNOSIS — I4891 Unspecified atrial fibrillation: Secondary | ICD-10-CM | POA: Diagnosis not present

## 2017-06-27 DIAGNOSIS — I429 Cardiomyopathy, unspecified: Secondary | ICD-10-CM | POA: Diagnosis not present

## 2017-06-27 DIAGNOSIS — Z9181 History of falling: Secondary | ICD-10-CM | POA: Diagnosis not present

## 2017-06-27 DIAGNOSIS — I739 Peripheral vascular disease, unspecified: Secondary | ICD-10-CM | POA: Diagnosis not present

## 2017-06-27 DIAGNOSIS — I509 Heart failure, unspecified: Secondary | ICD-10-CM | POA: Diagnosis not present

## 2017-06-27 DIAGNOSIS — K5790 Diverticulosis of intestine, part unspecified, without perforation or abscess without bleeding: Secondary | ICD-10-CM | POA: Diagnosis not present

## 2017-06-27 DIAGNOSIS — E039 Hypothyroidism, unspecified: Secondary | ICD-10-CM | POA: Diagnosis not present

## 2017-06-27 DIAGNOSIS — M19011 Primary osteoarthritis, right shoulder: Secondary | ICD-10-CM | POA: Diagnosis not present

## 2017-06-27 DIAGNOSIS — M75121 Complete rotator cuff tear or rupture of right shoulder, not specified as traumatic: Secondary | ICD-10-CM | POA: Diagnosis not present

## 2017-06-28 ENCOUNTER — Ambulatory Visit: Payer: PPO | Admitting: Student in an Organized Health Care Education/Training Program

## 2017-06-28 DIAGNOSIS — I495 Sick sinus syndrome: Secondary | ICD-10-CM | POA: Diagnosis not present

## 2017-07-05 ENCOUNTER — Other Ambulatory Visit: Payer: Self-pay

## 2017-07-05 ENCOUNTER — Ambulatory Visit
Payer: PPO | Attending: Student in an Organized Health Care Education/Training Program | Admitting: Student in an Organized Health Care Education/Training Program

## 2017-07-05 ENCOUNTER — Encounter: Payer: Self-pay | Admitting: Student in an Organized Health Care Education/Training Program

## 2017-07-05 VITALS — BP 96/59 | HR 57 | Temp 98.0°F | Resp 16 | Ht 64.0 in | Wt 125.0 lb

## 2017-07-05 DIAGNOSIS — Z87440 Personal history of urinary (tract) infections: Secondary | ICD-10-CM | POA: Diagnosis not present

## 2017-07-05 DIAGNOSIS — Z7982 Long term (current) use of aspirin: Secondary | ICD-10-CM | POA: Insufficient documentation

## 2017-07-05 DIAGNOSIS — M4804 Spinal stenosis, thoracic region: Secondary | ICD-10-CM | POA: Insufficient documentation

## 2017-07-05 DIAGNOSIS — I509 Heart failure, unspecified: Secondary | ICD-10-CM | POA: Diagnosis not present

## 2017-07-05 DIAGNOSIS — M5116 Intervertebral disc disorders with radiculopathy, lumbar region: Secondary | ICD-10-CM | POA: Diagnosis not present

## 2017-07-05 DIAGNOSIS — M5416 Radiculopathy, lumbar region: Secondary | ICD-10-CM | POA: Diagnosis not present

## 2017-07-05 DIAGNOSIS — M19011 Primary osteoarthritis, right shoulder: Secondary | ICD-10-CM | POA: Diagnosis not present

## 2017-07-05 DIAGNOSIS — M4854XA Collapsed vertebra, not elsewhere classified, thoracic region, initial encounter for fracture: Secondary | ICD-10-CM | POA: Diagnosis not present

## 2017-07-05 DIAGNOSIS — M5136 Other intervertebral disc degeneration, lumbar region: Secondary | ICD-10-CM | POA: Diagnosis not present

## 2017-07-05 DIAGNOSIS — I890 Lymphedema, not elsewhere classified: Secondary | ICD-10-CM | POA: Diagnosis not present

## 2017-07-05 DIAGNOSIS — E43 Unspecified severe protein-calorie malnutrition: Secondary | ICD-10-CM | POA: Diagnosis not present

## 2017-07-05 DIAGNOSIS — M48062 Spinal stenosis, lumbar region with neurogenic claudication: Secondary | ICD-10-CM | POA: Diagnosis not present

## 2017-07-05 DIAGNOSIS — E039 Hypothyroidism, unspecified: Secondary | ICD-10-CM | POA: Insufficient documentation

## 2017-07-05 DIAGNOSIS — Z95810 Presence of automatic (implantable) cardiac defibrillator: Secondary | ICD-10-CM | POA: Diagnosis not present

## 2017-07-05 DIAGNOSIS — M533 Sacrococcygeal disorders, not elsewhere classified: Secondary | ICD-10-CM | POA: Diagnosis not present

## 2017-07-05 DIAGNOSIS — L89509 Pressure ulcer of unspecified ankle, unspecified stage: Secondary | ICD-10-CM | POA: Diagnosis not present

## 2017-07-05 DIAGNOSIS — I482 Chronic atrial fibrillation, unspecified: Secondary | ICD-10-CM

## 2017-07-05 DIAGNOSIS — M79601 Pain in right arm: Secondary | ICD-10-CM | POA: Diagnosis present

## 2017-07-05 DIAGNOSIS — Z79899 Other long term (current) drug therapy: Secondary | ICD-10-CM | POA: Diagnosis not present

## 2017-07-05 DIAGNOSIS — D500 Iron deficiency anemia secondary to blood loss (chronic): Secondary | ICD-10-CM | POA: Insufficient documentation

## 2017-07-05 DIAGNOSIS — M75121 Complete rotator cuff tear or rupture of right shoulder, not specified as traumatic: Secondary | ICD-10-CM | POA: Diagnosis not present

## 2017-07-05 DIAGNOSIS — M4120 Other idiopathic scoliosis, site unspecified: Secondary | ICD-10-CM | POA: Diagnosis not present

## 2017-07-05 DIAGNOSIS — K5790 Diverticulosis of intestine, part unspecified, without perforation or abscess without bleeding: Secondary | ICD-10-CM | POA: Diagnosis not present

## 2017-07-05 DIAGNOSIS — M47816 Spondylosis without myelopathy or radiculopathy, lumbar region: Secondary | ICD-10-CM | POA: Diagnosis not present

## 2017-07-05 DIAGNOSIS — G894 Chronic pain syndrome: Secondary | ICD-10-CM | POA: Diagnosis not present

## 2017-07-05 DIAGNOSIS — I429 Cardiomyopathy, unspecified: Secondary | ICD-10-CM | POA: Insufficient documentation

## 2017-07-05 DIAGNOSIS — R109 Unspecified abdominal pain: Secondary | ICD-10-CM | POA: Diagnosis not present

## 2017-07-05 DIAGNOSIS — Z86010 Personal history of colonic polyps: Secondary | ICD-10-CM | POA: Insufficient documentation

## 2017-07-05 DIAGNOSIS — M810 Age-related osteoporosis without current pathological fracture: Secondary | ICD-10-CM | POA: Insufficient documentation

## 2017-07-05 DIAGNOSIS — I70239 Atherosclerosis of native arteries of right leg with ulceration of unspecified site: Secondary | ICD-10-CM | POA: Insufficient documentation

## 2017-07-05 DIAGNOSIS — I4891 Unspecified atrial fibrillation: Secondary | ICD-10-CM | POA: Diagnosis not present

## 2017-07-05 DIAGNOSIS — I70249 Atherosclerosis of native arteries of left leg with ulceration of unspecified site: Secondary | ICD-10-CM | POA: Diagnosis not present

## 2017-07-05 DIAGNOSIS — K760 Fatty (change of) liver, not elsewhere classified: Secondary | ICD-10-CM | POA: Insufficient documentation

## 2017-07-05 DIAGNOSIS — I6529 Occlusion and stenosis of unspecified carotid artery: Secondary | ICD-10-CM | POA: Insufficient documentation

## 2017-07-05 DIAGNOSIS — I739 Peripheral vascular disease, unspecified: Secondary | ICD-10-CM | POA: Diagnosis not present

## 2017-07-05 DIAGNOSIS — Z8673 Personal history of transient ischemic attack (TIA), and cerebral infarction without residual deficits: Secondary | ICD-10-CM | POA: Insufficient documentation

## 2017-07-05 DIAGNOSIS — F329 Major depressive disorder, single episode, unspecified: Secondary | ICD-10-CM | POA: Insufficient documentation

## 2017-07-05 DIAGNOSIS — K5900 Constipation, unspecified: Secondary | ICD-10-CM | POA: Insufficient documentation

## 2017-07-05 DIAGNOSIS — Z9181 History of falling: Secondary | ICD-10-CM | POA: Diagnosis not present

## 2017-07-05 DIAGNOSIS — I252 Old myocardial infarction: Secondary | ICD-10-CM | POA: Insufficient documentation

## 2017-07-05 NOTE — Progress Notes (Signed)
Nursing Pain Medication Assessment:  Safety precautions to be maintained throughout the outpatient stay will include: orient to surroundings, keep bed in low position, maintain call bell within reach at all times, provide assistance with transfer out of bed and ambulation.  Medication Inspection Compliance: Pill count conducted under aseptic conditions, in front of the patient. Neither the pills nor the bottle was removed from the patient's sight at any time. Once count was completed pills were immediately returned to the patient in their original bottle.  Medication: Oxycodone/APAP Pill/Patch Count: 23 of 90 pills remain Pill/Patch Appearance: Markings consistent with prescribed medication Bottle Appearance: Standard pharmacy container. Clearly labeled. Filled Date: 01 / 08/ 2019 Last Medication intake:  Today

## 2017-07-05 NOTE — Patient Instructions (Signed)
Patient already has 2 prescriptions for Percocet that are already at her pharmacy, she can pick them up when her current Rx runs out. If she has any problem with these prescriptions that were originally written in January she can call our clinic.

## 2017-07-05 NOTE — Progress Notes (Signed)
Patient's Name: Joanna Roberts  MRN: [id number]  Referring Provider: Marinda Elk, MD  DOB: January 26, 1929  PCP: Marinda Elk, MD  DOS: 07/05/2017  Note by: Gillis Santa, MD  Service setting: Ambulatory outpatient  Specialty: Interventional Pain Management  Location: [facility] (AMB) Pain Management Facility    Patient type: Established   Primary Reason(s) for Visit: Encounter for prescription drug management. (Level of risk: moderate)  CC: Arm Pain (right)  HPI  Joanna Roberts is a 84 y.o. year old, female patient, who comes today for a medication management evaluation. She has Pneumonia; A-fib ([place]); Acquired hypothyroidism; Atrial fibrillation ([place]); Cardiomyopathy ([place]); Chronic abdominal pain; Chronic anemia; Back pain, chronic; CN (constipation); DD (diverticular disease); Fatty infiltration of liver; Aggrieved; Bergmann's syndrome; History of colon polyps; Personal history of urinary infection; History of urinary anomaly; Incomplete bladder emptying; Cannot sleep; Frequent UTI; Esophagitis, reflux; Pain in rib; Tachycardia-bradycardia ([place]); Temporary cerebral vascular dysfunction; FOM (frequency of micturition); B12 deficiency; DDD (degenerative disc disease), lumbar; Facet syndrome, lumbar; Sacroiliac joint dysfunction; Spinal stenosis, lumbar region, with neurogenic claudication; Lumbar radiculopathy; Compression fracture of thoracic spine, non-traumatic ([place]); Idiopathic scoliosis; H/O varicose vein stripping; Chronic venous insufficiency; Chronic mesenteric ischemia ([place]); Hematoma of leg, right, initial encounter; Acute blood loss anemia; Sepsis ([place]); Protein-calorie malnutrition, severe; Lymphedema; Varicose veins of both lower extremities; Atherosclerosis of native arteries of the extremities with ulceration ([place]); Ankle ulcer ([place]); and Carotid stenosis on their problem list. Her primarily concern today is the Arm Pain (right)  Pain Assessment: Location: Right Arm Radiating:   Onset: More  than a month ago Duration: Chronic pain Quality: Aching Severity: 6 /10 (self-reported pain score)  Note: Reported level is compatible with observation.                         When using our objective Pain Scale, levels between 6 and 10/10 are said to belong in an emergency room, as it progressively worsens from a 6/10, described as severely limiting, requiring emergency care not usually available at an outpatient pain management facility. At a 6/10 level, communication becomes difficult and requires great effort. Assistance to reach the emergency department may be required. Facial flushing and profuse sweating along with potentially dangerous increases in heart rate and blood pressure will be evident. Effect on ADL:   Timing: Constant Modifying factors:    Joanna Roberts was last scheduled for an appointment on 05/03/2017 for medication management. During today's appointment we reviewed Joanna Roberts's chronic pain status, as well as her outpatient medication regimen.  The patient  reports that she does not use drugs. Her body mass index is 21.46 kg/m.  Further details on both, my assessment(s), as well as the proposed treatment plan, please see below.  Controlled Substance Pharmacotherapy Assessment REMS (Risk Evaluation and Mitigation Strategy)  Analgesic: Oxycodone 5 mg BID prn, #60 MME/day: 15 mg/day.  Dewayne Shorter, RN  07/05/2017 10:51 AM  Signed Nursing Pain Medication Assessment:  Safety precautions to be maintained throughout the outpatient stay will include: orient to surroundings, keep bed in low position, maintain call bell within reach at all times, provide assistance with transfer out of bed and ambulation.  Medication Inspection Compliance: Pill count conducted under aseptic conditions, in front of the patient. Neither the pills nor the bottle was removed from the patient's sight at any time. Once count was completed pills were immediately returned to the patient in their original  bottle.  Medication: Oxycodone/APAP Pill/Patch Count: 23  of 90 pills remain Pill/Patch Appearance: Markings consistent with prescribed medication Bottle Appearance: Standard pharmacy container. Clearly labeled. Filled Date: 01 / 08/ 2019 Last Medication intake:  Today   Pharmacokinetics: Liberation and absorption (onset of action): WNL Distribution (time to peak effect): WNL Metabolism and excretion (duration of action): WNL         Pharmacodynamics: Desired effects: Analgesia: Joanna Roberts reports >50% benefit. Functional ability: Patient reports that medication allows her to accomplish basic ADLs Clinically meaningful improvement in function (CMIF): Sustained CMIF goals met Perceived effectiveness: Described as relatively effective, allowing for increase in activities of daily living (ADL) Undesirable effects: Side-effects or Adverse reactions: None reported Monitoring: [place] PMP: Online review of the past 60-monthperiod conducted. Compliant with practice rules and regulations Last UDS on record: Summary  Date Value Ref Range Status  12/09/2016 FINAL  Final    Comment:    ==================================================================== TOXASSURE COMP DRUG ANALYSIS,UR ==================================================================== Test                             Result       Flag       Units Drug Present and Declared for Prescription Verification   Oxazepam                       551          EXPECTED   ng/mg creat   Temazepam                      >2105        EXPECTED   ng/mg creat    Oxazepam and temazepam are expected metabolites of diazepam.    Oxazepam is also an expected metabolite of other benzodiazepine    drugs, including chlordiazepoxide, prazepam, clorazepate,    halazepam, and temazepam.  Oxazepam and temazepam are available    as scheduled prescription medications.   Oxycodone                      1667         EXPECTED   ng/mg creat   Oxymorphone                     697          EXPECTED   ng/mg creat   Noroxycodone                   1253         EXPECTED   ng/mg creat   Noroxymorphone                 118          EXPECTED   ng/mg creat    Sources of oxycodone are scheduled prescription medications.    Oxymorphone, noroxycodone, and noroxymorphone are expected    metabolites of oxycodone. Oxymorphone is also available as a    scheduled prescription medication.   Gabapentin                     PRESENT      EXPECTED   Acetaminophen                  PRESENT      EXPECTED Drug Absent but Declared for Prescription Verification   Hydrocodone  Not Detected UNEXPECTED ng/mg creat   Diclofenac                     Not Detected UNEXPECTED    Diclofenac, as indicated in the declared medication list, is not    always detected even when used as directed. ==================================================================== Test                      Result    Flag   Units      Ref Range   Creatinine              95               mg/dL      >=20 ==================================================================== Declared Medications:  The flagging and interpretation on this report are based on the  following declared medications.  Unexpected results may arise from  inaccuracies in the declared medications.  **Note: The testing scope of this panel includes these medications:  Gabapentin  Hydrocodone (Hydrocodone-Acetaminophen)  Oxycodone  Oxycodone (Oxycodone Acetaminophen)  Temazepam  **Note: The testing scope of this panel does not include small to  moderate amounts of these reported medications:  Acetaminophen (Hydrocodone-Acetaminophen)  Acetaminophen (Oxycodone Acetaminophen)  Diclofenac  **Note: The testing scope of this panel does not include following  reported medications:  Amoxicillin (Augmentin)  Atropine (Lomotil)  Budesonide  Carvedilol  Ciprofloxacin  Clavulinate (Augmentin)  Cyanocobalamin  Diphenoxylate  (Lomotil)  Docusate (Colace)  Ferrous Gluconate  Furosemide  Levothyroxine  Loperamide  Losartan (Losartan Potassium)  Metronidazole  Nitroglycerin  Ondansetron  Prednisone  Silver  Sulfamethoxazole (Trimethoprim-Sulfamethoxazole)  Trimethoprim (Trimethoprim-Sulfamethoxazole) ==================================================================== For clinical consultation, please call [phone]. ====================================================================    UDS interpretation: Compliant          Medication Assessment Form: Reviewed. Patient indicates being compliant with therapy Treatment compliance: Compliant Risk Assessment Profile: Aberrant behavior: See prior evaluations. None observed or detected today Comorbid factors increasing risk of overdose: See prior notes. No additional risks detected today Risk of substance use disorder (SUD): Low  ORT Scoring interpretation table:  Score <3 = Low Risk for SUD  Score between 4-7 = Moderate Risk for SUD  Score >8 = High Risk for Opioid Abuse   Risk Mitigation Strategies:  Patient Counseling: Covered Patient-Prescriber Agreement (PPA): Present and active  Notification to other healthcare providers: Done  Pharmacologic Plan: No change in therapy, at this time.             Laboratory Chemistry  Inflammation Markers (CRP: Acute Phase) (ESR: Chronic Phase) Lab Results  Component Value Date   LATICACIDVEN 1.4 06/01/2016                         Rheumatology Markers No results found for: RF, ANA, LABURIC, URICUR, LYMEIGGIGMAB, LYMEABIGMQN              Renal Function Markers Lab Results  Component Value Date   BUN 23 (H) 02/08/2017   CREATININE 1.03 (H) 02/08/2017   GFRAA 55 (L) 02/08/2017   GFRNONAA 48 (L) 02/08/2017                 Hepatic Function Markers Lab Results  Component Value Date   AST 28 06/02/2016   ALT 15 06/02/2016   ALBUMIN 2.7 (L) 06/02/2016   ALKPHOS 30 (L) 06/02/2016   LIPASE 20  03/09/2016  Electrolytes Lab Results  Component Value Date   NA 136 08/29/2016   K 4.8 08/29/2016   CL 106 08/29/2016   CALCIUM 8.2 (L) 08/29/2016   MG 1.7 03/09/2016                        Neuropathy Markers No results found for: VITAMINB12, FOLATE, HGBA1C, HIV               Bone Pathology Markers No results found for: VD25OH, VD125OH2TOT, MW4132GM0, NU2725DG6, 25OHVITD1, 25OHVITD2, 25OHVITD3, TESTOFREE, TESTOSTERONE                       Coagulation Parameters Lab Results  Component Value Date   INR 1.23 08/29/2016   LABPROT 15.6 (H) 08/29/2016   APTT 38 (H) 08/27/2016   PLT 120 (L) 08/29/2016                 Cardiovascular Markers Lab Results  Component Value Date   TROPONINI <0.03 08/29/2016   HGB 9.3 (L) 08/29/2016   HCT 22.0 (L) 08/29/2016                 CA Markers No results found for: CEA, CA125, LABCA2               Note: Lab results reviewed.  Recent Diagnostic Imaging Results  PERIPHERAL VASCULAR CATHETERIZATION See op note  Complexity Note: Imaging results reviewed. Results shared with Ms. Surita, using Layman's terms.                         Meds   Current Outpatient Medications:  .  acetaminophen (TYLENOL) 500 MG tablet, Take 1,000 mg by mouth every 8 (eight) hours as needed for mild pain or headache., Disp: , Rfl:  .  aspirin EC 81 MG tablet, Take 1 tablet (81 mg total) by mouth daily., Disp: 150 tablet, Rfl: 2 .  carvedilol (COREG) 25 MG tablet, TAKE ONE TABLET BY MOUTH TWICE DAILY, Disp: , Rfl:  .  furosemide (LASIX) 20 MG tablet, Take 1 tablet (20 mg total) by mouth daily as needed for fluid., Disp: , Rfl:  .  gabapentin (NEURONTIN) 300 MG capsule, Take 1 capsule (300 mg total) at bedtime by mouth., Disp: 30 capsule, Rfl: 3 .  levothyroxine (SYNTHROID, LEVOTHROID) 25 MCG tablet, TAKE 1 TABLET EVERY DAY ON EMPTY STOMACHWITH A GLASS OF WATER AT LEAST 30-60 MINBEFORE BREAKFAST, Disp: , Rfl:  .  losartan (COZAAR) 25 MG tablet, TAKE  ONE TABLET EVERY DAY IN THE MORNING, Disp: , Rfl:  .  nitroGLYCERIN (NITROSTAT) 0.4 MG SL tablet, Place 0.4 mg under the tongue every 5 (five) minutes as needed for chest pain. , Disp: , Rfl:  .  ondansetron (ZOFRAN) 4 MG tablet, Take 4 mg by mouth every 8 (eight) hours as needed for nausea or vomiting. , Disp: , Rfl:  .  oxyCODONE-acetaminophen (PERCOCET/ROXICET) 5-325 MG tablet, Take 1 tablet by mouth 2 (two) times daily as needed for severe pain. For chronic pain Fill on or after 05/03/2017, 06/02/2017, Disp: 60 tablet, Rfl: 0 .  silver sulfADIAZINE (SILVADENE) 1 % cream, Apply 1 application topically 2 (two) times daily. Apply to area twice a day, Disp: 25 g, Rfl: 2 .  vitamin B-12 (CYANOCOBALAMIN) 1000 MCG tablet, Take 1,000 mcg by mouth daily., Disp: , Rfl:   Current Facility-Administered Medications:  .  mirabegron ER (MYRBETRIQ) tablet 25 mg, 25  mg, Oral, Daily, Rubie Maid, MD  ROS  Constitutional: Denies any fever or chills Gastrointestinal: No reported hemesis, hematochezia, vomiting, or acute GI distress Musculoskeletal: Denies any acute onset joint swelling, redness, loss of ROM, or weakness Neurological: No reported episodes of acute onset apraxia, aphasia, dysarthria, agnosia, amnesia, paralysis, loss of coordination, or loss of consciousness  Allergies  Ms. Clucas is allergic to lortab [hydrocodone-acetaminophen]; doxycycline; macrobid [nitrofurantoin monohyd macro]; and nitrofurantoin.  PFSH  Drug: Ms. Henderson  reports that she does not use drugs. Alcohol:  reports that she does not drink alcohol. Tobacco:  reports that  has never smoked. she has never used smokeless tobacco. Medical:  has a past medical history of AICD (automatic cardioverter/defibrillator) present, Allergy, Anemia, Arthritis, Cataract, CHF (congestive heart failure) ([place]), Depression, Dysrhythmia, GERD (gastroesophageal reflux disease), Heart murmur, History of TIAs, Hypertension, Myocardial infarction ([place]),  Osteoporosis, Presence of permanent cardiac pacemaker, and Thyroid disease. Surgical: Ms. Bertucci  has a past surgical history that includes Hernia repair; Insert / replace / remove pacemaker; Appendectomy; Abdominal hysterectomy; Cardiac catheterization (Right, 10/08/2014); Cardiac catheterization (10/08/2014); Cardiac catheterization (N/A, 03/23/2016); pessary; and Lower Extremity Angiography (Left, 02/08/2017). Family: family history includes Arthritis in her father; Asthma in her father; Early death in her mother; Heart disease in her mother; Stroke in her sister.  Constitutional Exam  General appearance: Well nourished, well developed, and well hydrated. In no apparent acute distress Vitals:   07/05/17 1041  BP: (!) 96/59  Pulse: (!) 57  Resp: 16  Temp: 98 F (36.7 C)  SpO2: 100%  Weight: 125 lb (56.7 kg)  Height: '5\' 4"'$  (1.626 m)   BMI Assessment: Estimated body mass index is 21.46 kg/m as calculated from the following:   Height as of this encounter: '5\' 4"'$  (1.626 m).   Weight as of this encounter: 125 lb (56.7 kg).  BMI interpretation table: BMI level Category Range association with higher incidence of chronic pain  <18 kg/m2 Underweight   18.5-24.9 kg/m2 Ideal body weight   25-29.9 kg/m2 Overweight Increased incidence by 20%  30-34.9 kg/m2 Obese (Class I) Increased incidence by 68%  35-39.9 kg/m2 Severe obesity (Class II) Increased incidence by 136%  >40 kg/m2 Extreme obesity (Class III) Increased incidence by 254%   BMI Readings from Last 4 Encounters:  07/05/17 21.46 kg/m  05/03/17 21.46 kg/m  05/02/17 22.60 kg/m  03/16/17 22.14 kg/m   Wt Readings from Last 4 Encounters:  07/05/17 125 lb (56.7 kg)  05/03/17 125 lb (56.7 kg)  05/02/17 127 lb 9.6 oz (57.9 kg)  03/16/17 125 lb (56.7 kg)  Psych/Mental status: Alert, oriented x 3 (person, place, & time)       Eyes: PERLA Respiratory: No evidence of acute respiratory distress Cervical Spine Area Exam  Skin & Axial  Inspection:Paravertebral muscle atrophy Alignment:Asymmetric Functional GYK:[id number] ROM Stability:No instability detected Muscle Tone/Strength:Functionally intact. No obvious neuro-muscular anomalies detected. Sensory (Neurological):Movement-associated pain Palpation:Complains of area being tender to palpation         Upper Extremity (UE) Exam    Side:Right upper extremity  Side:Left upper extremity   Skin & Extremity Inspection:Positive color changes  Skin & Extremity Inspection:Positive color changes   Functional BLT:[id number] ROM  Functional MAU:[id number] ROM   Muscle Tone/Strength:Mild-to-medarate deconditioning  Muscle Tone/Strength:Moderate-to-severe deconditioning   Sensory (Neurological):Movement-associated pain  Sensory (Neurological):Movement-associated pain   Palpation:Tender to palpation along right proximal bicep  Palpation:No palpable anomalies   Specialized Test(s):Deferred  Specialized Test(s):Deferred   Right upper extremity strength: Elbow flexion, elbow extension 4  out of 5. Patient unable to AB duct right arm secondary to shoulder pain. Thoracic Spine Area Exam  Skin & Axial Inspection:Significant thoracic kyphosis Alignment:Asymmetric Functional NKN:LZJQBHALP ROM Stability:No instability detected Muscle Tone/Strength:Functionally intact. No obvious neuro-muscular anomalies detected. Sensory (Neurological):Movement associated pain Muscle strength & Tone:Complains of area being tender to palpationoverlying thoracic vertebra  Lumbar Spine Area Exam  Skin & Axial Inspection:Thoraco-lumbar Scoliosis Alignment:Asymmetric Functional FXT:[id number] ROM Stability:No instability detected Muscle Tone/Strength:Functionally intact. No obvious neuro-muscular anomalies detected. Sensory (Neurological):Movement-associated  pain Palpation:Tender Provocative Tests: Lumbar Hyperextension and rotation test:Positive Lumbar Lateral bending test:Positive Patrick's Maneuver:evaluation deferred today  Gait & Posture Assessment  Ambulation:Patient ambulates using a walker Gait:Age-related, senile gait pattern Posture:Thoracic kyphosis  Lower Extremity Exam    Side:Right lower extremity  Side:Left lower extremity  Skin & Extremity Inspection:Positive color changes, vascular changes noted, mild venous stasis edema   Skin & Extremity Inspection:Positive color changes  Functional GDJ:MEQASTMHD ROM  Functional QQI:[id number] ROM  Muscle Tone/Strength:Functionally intact. No obvious neuro-muscular anomalies detected.  Muscle Tone/Strength:Functionally intact. No obvious neuro-muscular anomalies detected.  Sensory (Neurological):Movement-associated pain  Sensory (Neurological):Unimpaired  Palpation:Complains of area being tender to palpation     Palpation:No palpable anomalies  Palpation:No palpable anomalies     Assessment  Primary Diagnosis & Pertinent Problem List: The primary encounter diagnosis was DDD (degenerative disc disease), lumbar. Diagnoses of Facet syndrome, lumbar, Spinal stenosis, lumbar region, with neurogenic claudication, Compression fracture of thoracic spine, non-traumatic, initial encounter [facility]), Lumbar radiculopathy, Sacroiliac joint dysfunction, Chronic atrial fibrillation ([place]), and Chronic pain syndrome were also pertinent to this visit.  Status Diagnosis  Persistent Persistent Persistent 1. DDD (degenerative disc disease), lumbar   2. Facet syndrome, lumbar   3. Spinal stenosis, lumbar region, with neurogenic claudication   4. Compression fracture of thoracic spine, non-traumatic, initial encounter ([place])   5. Lumbar radiculopathy   6. Sacroiliac joint dysfunction   7. Chronic atrial fibrillation  ([facility])   8. Chronic pain syndrome      General Recommendations: The pain condition that the patient suffers from is best treated with a multidisciplinary approach that involves an increase in physical activity to prevent de-conditioning and worsening of the pain cycle, as well as psychological counseling (formal and/or informal) to address the co-morbid psychological affects of pain. Treatment will often involve judicious use of pain medications and interventional procedures to decrease the pain, allowing the patient to participate in the physical activity that will ultimately produce long-lasting pain reductions. The goal of the multidisciplinary approach is to return the patient to a higher level of overall function and to restore their ability to perform activities of daily living.  Patient is a very pleasant 84 year old female with a complex and extensive past medical history including cardiac history significant for atrial fibrillation, cardiomyopathy, peripheral vascular disease who is referred for evaluation of low back pain that is present on both sides with radiation to her right knee in a posterior dermatomal distribution involving the S1-S2 dermatomes primarily. Patient's low back pain which radiates to the right leg is multifactorial secondary to severe scoliosis, severe degenerative lumbar spondylosis with multilevel facetogenic disease, compression fractures of T12 and L4, and moderate spinal canal stenosis and lateral recess stenosis at L2-L3 and at L4-L5. Patient does have symptoms of spinal stenosis with neurogenic claudication.In terms of her medication regimen, patient takes gabapentin 300 mg daily at bedtime along with oxycodone 5 mg 2-3 times a day as needed for severe pain. Patient is low risk for abuse/misuse of chronic opioid therapy.  Patient continues to have 2 prescriptions at her pharmacy that she can fill.  Patient will follow-up in with me in approximately 8 weeks for  medication management.  Today we discussed various exercises that the patient can do at home to help with her low back pain.  We discussed lumbar paraspinal stretching exercises as well as hip exercises that she can do while sitting on her chair.    Provider-requested follow-up: Return in about 8 weeks (around 08/30/2017) for Medication Management.   Time Note: Greater than 50% of the 15 minute(s) of face-to-face time spent with Ms. Shoults, was spent in counseling/coordination of care regarding: Ms. Bentley primary cause of pain, the treatment plan, treatment alternatives, the opioid analgesic risks and possible complications, the appropriate use of her medications, realistic expectations and the goals of pain management (increased in functionality). Future Appointments  Date Time Provider [place]  07/18/2017  2:30 PM [facility] 1 [facility] None  07/18/2017  3:30 PM Schnier, Dolores Lory, MD [facility] None  08/25/2017 10:45 AM Gillis Santa, MD [facility] None  09/20/2017 10:30 AM [facility] 3 [facility] None  09/20/2017 11:15 AM [facility] 3 [facility] None  09/20/2017 11:45 AM Stegmayer, Janalyn Harder, PA-C [facility] None  11/03/2017  9:00 AM [facility] 1 [facility] None  11/03/2017 10:00 AM Schnier, Dolores Lory, MD [facility] None    Primary Care Physician: Marinda Elk, MD Location: [facility] Note by: Gillis Santa, M.D Date: 07/05/2017; Time: 1:58 PM  Patient Instructions  Patient already has 2 prescriptions for Percocet that are already at her pharmacy, she can pick them up when her current Rx runs out. If she has any problem with these prescriptions that were originally written in January she can call our clinic.

## 2017-07-11 DIAGNOSIS — M75121 Complete rotator cuff tear or rupture of right shoulder, not specified as traumatic: Secondary | ICD-10-CM | POA: Diagnosis not present

## 2017-07-11 DIAGNOSIS — F329 Major depressive disorder, single episode, unspecified: Secondary | ICD-10-CM | POA: Diagnosis not present

## 2017-07-11 DIAGNOSIS — Z9181 History of falling: Secondary | ICD-10-CM | POA: Diagnosis not present

## 2017-07-11 DIAGNOSIS — E039 Hypothyroidism, unspecified: Secondary | ICD-10-CM | POA: Diagnosis not present

## 2017-07-11 DIAGNOSIS — I4891 Unspecified atrial fibrillation: Secondary | ICD-10-CM | POA: Diagnosis not present

## 2017-07-11 DIAGNOSIS — I509 Heart failure, unspecified: Secondary | ICD-10-CM | POA: Diagnosis not present

## 2017-07-11 DIAGNOSIS — I739 Peripheral vascular disease, unspecified: Secondary | ICD-10-CM | POA: Diagnosis not present

## 2017-07-11 DIAGNOSIS — K5790 Diverticulosis of intestine, part unspecified, without perforation or abscess without bleeding: Secondary | ICD-10-CM | POA: Diagnosis not present

## 2017-07-11 DIAGNOSIS — I429 Cardiomyopathy, unspecified: Secondary | ICD-10-CM | POA: Diagnosis not present

## 2017-07-11 DIAGNOSIS — M19011 Primary osteoarthritis, right shoulder: Secondary | ICD-10-CM | POA: Diagnosis not present

## 2017-07-14 DIAGNOSIS — R062 Wheezing: Secondary | ICD-10-CM | POA: Diagnosis not present

## 2017-07-14 DIAGNOSIS — I872 Venous insufficiency (chronic) (peripheral): Secondary | ICD-10-CM | POA: Diagnosis not present

## 2017-07-14 DIAGNOSIS — I10 Essential (primary) hypertension: Secondary | ICD-10-CM | POA: Diagnosis not present

## 2017-07-18 ENCOUNTER — Other Ambulatory Visit (INDEPENDENT_AMBULATORY_CARE_PROVIDER_SITE_OTHER): Payer: Self-pay | Admitting: Vascular Surgery

## 2017-07-18 ENCOUNTER — Encounter (INDEPENDENT_AMBULATORY_CARE_PROVIDER_SITE_OTHER): Payer: Self-pay | Admitting: Vascular Surgery

## 2017-07-18 ENCOUNTER — Ambulatory Visit (INDEPENDENT_AMBULATORY_CARE_PROVIDER_SITE_OTHER): Payer: PPO

## 2017-07-18 ENCOUNTER — Ambulatory Visit (INDEPENDENT_AMBULATORY_CARE_PROVIDER_SITE_OTHER): Payer: PPO | Admitting: Vascular Surgery

## 2017-07-18 VITALS — BP 119/79 | HR 76 | Resp 14 | Ht 63.0 in | Wt 129.0 lb

## 2017-07-18 DIAGNOSIS — I7025 Atherosclerosis of native arteries of other extremities with ulceration: Secondary | ICD-10-CM

## 2017-07-18 DIAGNOSIS — I6523 Occlusion and stenosis of bilateral carotid arteries: Secondary | ICD-10-CM

## 2017-07-18 DIAGNOSIS — M5136 Other intervertebral disc degeneration, lumbar region: Secondary | ICD-10-CM

## 2017-07-18 DIAGNOSIS — I872 Venous insufficiency (chronic) (peripheral): Secondary | ICD-10-CM | POA: Diagnosis not present

## 2017-07-18 DIAGNOSIS — I482 Chronic atrial fibrillation, unspecified: Secondary | ICD-10-CM

## 2017-07-19 DIAGNOSIS — I10 Essential (primary) hypertension: Secondary | ICD-10-CM | POA: Diagnosis not present

## 2017-07-19 DIAGNOSIS — I482 Chronic atrial fibrillation: Secondary | ICD-10-CM | POA: Diagnosis not present

## 2017-07-19 DIAGNOSIS — I739 Peripheral vascular disease, unspecified: Secondary | ICD-10-CM | POA: Diagnosis not present

## 2017-07-19 DIAGNOSIS — I429 Cardiomyopathy, unspecified: Secondary | ICD-10-CM | POA: Diagnosis not present

## 2017-07-19 DIAGNOSIS — I872 Venous insufficiency (chronic) (peripheral): Secondary | ICD-10-CM | POA: Diagnosis not present

## 2017-07-19 DIAGNOSIS — R0602 Shortness of breath: Secondary | ICD-10-CM | POA: Diagnosis not present

## 2017-07-19 DIAGNOSIS — K551 Chronic vascular disorders of intestine: Secondary | ICD-10-CM | POA: Diagnosis not present

## 2017-07-21 ENCOUNTER — Ambulatory Visit
Admission: RE | Admit: 2017-07-21 | Discharge: 2017-07-21 | Disposition: A | Discharge status: Home / Self Care | Payer: PPO | Source: Ambulatory Visit | Attending: Cardiology | Admitting: Cardiology

## 2017-07-21 ENCOUNTER — Other Ambulatory Visit: Payer: Self-pay

## 2017-07-21 ENCOUNTER — Encounter
Admission: RE | Admit: 2017-07-21 | Discharge: 2017-07-21 | Disposition: A | Discharge status: Home / Self Care | Payer: PPO | Source: Ambulatory Visit | Attending: Cardiology | Admitting: Cardiology

## 2017-07-21 DIAGNOSIS — I700 Atherosclerosis of aorta: Secondary | ICD-10-CM | POA: Insufficient documentation

## 2017-07-21 DIAGNOSIS — Z01818 Encounter for other preprocedural examination: Secondary | ICD-10-CM | POA: Diagnosis not present

## 2017-07-21 DIAGNOSIS — Z01810 Encounter for preprocedural cardiovascular examination: Secondary | ICD-10-CM | POA: Insufficient documentation

## 2017-07-21 DIAGNOSIS — Z950 Presence of cardiac pacemaker: Secondary | ICD-10-CM | POA: Diagnosis not present

## 2017-07-21 DIAGNOSIS — Z01812 Encounter for preprocedural laboratory examination: Secondary | ICD-10-CM | POA: Insufficient documentation

## 2017-07-21 DIAGNOSIS — I509 Heart failure, unspecified: Secondary | ICD-10-CM | POA: Insufficient documentation

## 2017-07-21 DIAGNOSIS — Z419 Encounter for procedure for purposes other than remedying health state, unspecified: Secondary | ICD-10-CM

## 2017-07-21 HISTORY — DX: Hypothyroidism, unspecified: E03.9

## 2017-07-21 LAB — BASIC METABOLIC PANEL
ANION GAP: 8 (ref 5–15)
BUN: 19 mg/dL (ref 6–20)
CALCIUM: 9.1 mg/dL (ref 8.9–10.3)
CO2: 28 mmol/L (ref 22–32)
CREATININE: 1.04 mg/dL — AB (ref 0.44–1.00)
Chloride: 101 mmol/L (ref 101–111)
GFR, EST AFRICAN AMERICAN: 55 mL/min — AB (ref >60)
GFR, EST NON AFRICAN AMERICAN: 47 mL/min — AB (ref >60)
Glucose, Bld: 113 mg/dL — ABNORMAL HIGH (ref 65–99)
Potassium: 4.4 mmol/L (ref 3.5–5.1)
SODIUM: 137 mmol/L (ref 135–145)

## 2017-07-21 LAB — DIFFERENTIAL
BASOS ABS: 0.1 10*3/uL (ref 0–0.1)
BASOS PCT: 1 %
EOS ABS: 0.1 10*3/uL (ref 0–0.7)
Eosinophils Relative: 3 %
LYMPHS ABS: 0.5 10*3/uL — AB (ref 1.0–3.6)
Lymphocytes Relative: 11 %
Monocytes Absolute: 0.5 10*3/uL (ref 0.2–0.9)
Monocytes Relative: 11 %
Neutro Abs: 3.5 10*3/uL (ref 1.4–6.5)
Neutrophils Relative %: 74 %

## 2017-07-21 LAB — PROTIME-INR
INR: 1.1
PROTHROMBIN TIME: 14.1 s (ref 11.4–15.2)

## 2017-07-21 LAB — SURGICAL PCR SCREEN
MRSA, PCR: NEGATIVE
Staphylococcus aureus: NEGATIVE

## 2017-07-21 LAB — CBC
HCT: 34.1 % — ABNORMAL LOW (ref 35.0–47.0)
HEMOGLOBIN: 11.1 g/dL — AB (ref 12.0–16.0)
MCH: 31.4 pg (ref 26.0–34.0)
MCHC: 32.4 g/dL (ref 32.0–36.0)
MCV: 96.9 fL (ref 80.0–100.0)
PLATELETS: 130 10*3/uL — AB (ref 150–440)
RBC: 3.52 MIL/uL — ABNORMAL LOW (ref 3.80–5.20)
RDW: 15.7 % — ABNORMAL HIGH (ref 11.5–14.5)
WBC: 4.8 10*3/uL (ref 3.6–11.0)

## 2017-07-21 LAB — APTT: aPTT: 29 seconds (ref 24–36)

## 2017-07-21 NOTE — Pre-Procedure Instructions (Signed)
Heather with Dr. America Brown office notified that Chest Xray showed CHF. She will have Dr. Lady Gary look at the xray and they will contact the patient.

## 2017-07-21 NOTE — Patient Instructions (Signed)
Your procedure is scheduled on:Wed. 07/27/17 Report to Day Surgery. To find out your arrival time please call [phone] between 1PM - 3PM on Tues. 07/26/17.  Remember: Instructions that are not followed completely may result in serious medical risk, up to and including death, or upon the discretion of your surgeon and anesthesiologist your surgery may need to be rescheduled.     _X__ 1. Do not eat food after midnight the night before your procedure.                 No gum chewing or hard candies. You may drink clear liquids up to 2 hours                 before you are scheduled to arrive for your surgery- DO not drink clear                 liquids within 2 hours of the start of your surgery.                 Clear Liquids include:  water, apple juice without pulp, clear carbohydrate                 drink such as Clearfast of Gartorade, Black Coffee or Tea (Do not add                 anything to coffee or tea).  __X__2.  On the morning of surgery brush your teeth with toothpaste and water, you may rinse your mouth with mouthwash if you wish.  Do not swallow any              toothpaste of mouthwash.     _X__ 3.  No Alcohol for 24 hours before or after surgery.   _X__ 4.  Do Not Smoke or use e-cigarettes For 24 Hours Prior to Your Surgery.                 Do not use any chewable tobacco products for at least 6 hours prior to                 surgery.  ____  5.  Bring all medications with you on the day of surgery if instructed.   ____  6.  Notify your doctor if there is any change in your medical condition      (cold, fever, infections).     Do not wear jewelry, make-up, hairpins, clips or nail polish. Do not wear lotions, powders, or perfumes. You may wear deodorant. Do not shave 48 hours prior to surgery. Men may shave face and neck. Do not bring valuables to the hospital.    [facility] is not responsible for any belongings or valuables.  Contacts, dentures or  bridgework may not be worn into surgery. Leave your suitcase in the car. After surgery it may be brought to your room. For patients admitted to the hospital, discharge time is determined by your treatment team.   Patients discharged the day of surgery will not be allowed to drive home.   Please read over the following fact sheets that you were given:    __x__ Take these medicines the morning of surgery with A SIP OF WATER:    1. acetaminophen (TYLENOL) 500 MG tablet or oxyCODONE-acetaminophen (PERCOCET/ROXICET) 5-325 MG tablet  if needed  2. carvedilol (COREG) 25 MG tablet  3. gabapentin (NEURONTIN) 300 MG capsule  4.levothyroxine (SYNTHROID, LEVOTHROID) 25 MCG tablet  5.  6.  ____ Fleet Enema (as directed)   _x___ Use CHG Soap as directed  ____ Use inhalers on the day of surgery  ____ Stop metformin 2 days prior to surgery    ____ Take 1/2 of usual insulin dose the night before surgery. No insulin the morning          of surgery.   __x__ Stop aspirin on per MD order today  ____ Stop Anti-inflammatories on    ____ Stop supplements until after surgery.    ____ Bring C-Pap to the hospital.

## 2017-07-26 MED ORDER — DEXTROSE 5 % IV SOLN
1000.0000 mg | Freq: Once | INTRAVENOUS | Status: DC
  Filled 2017-07-26: qty 10

## 2017-07-27 ENCOUNTER — Ambulatory Visit
Admission: RE | Admit: 2017-07-27 | Discharge: 2017-07-27 | Disposition: A | Discharge status: Home / Self Care | Payer: PPO | Source: Ambulatory Visit | Attending: Cardiology | Admitting: Cardiology

## 2017-07-27 ENCOUNTER — Ambulatory Visit: Payer: PPO | Admitting: Certified Registered Nurse Anesthetist

## 2017-07-27 ENCOUNTER — Encounter
Admission: RE | Disposition: A | Discharge status: Home / Self Care | Payer: Self-pay | Source: Ambulatory Visit | Attending: Cardiology

## 2017-07-27 DIAGNOSIS — Z79890 Hormone replacement therapy (postmenopausal): Secondary | ICD-10-CM | POA: Diagnosis not present

## 2017-07-27 DIAGNOSIS — I482 Chronic atrial fibrillation: Secondary | ICD-10-CM | POA: Insufficient documentation

## 2017-07-27 DIAGNOSIS — I739 Peripheral vascular disease, unspecified: Secondary | ICD-10-CM | POA: Diagnosis not present

## 2017-07-27 DIAGNOSIS — I110 Hypertensive heart disease with heart failure: Secondary | ICD-10-CM | POA: Diagnosis not present

## 2017-07-27 DIAGNOSIS — T82897A Other specified complication of cardiac prosthetic devices, implants and grafts, initial encounter: Secondary | ICD-10-CM | POA: Diagnosis not present

## 2017-07-27 DIAGNOSIS — I252 Old myocardial infarction: Secondary | ICD-10-CM | POA: Insufficient documentation

## 2017-07-27 DIAGNOSIS — Z7902 Long term (current) use of antithrombotics/antiplatelets: Secondary | ICD-10-CM | POA: Diagnosis not present

## 2017-07-27 DIAGNOSIS — I429 Cardiomyopathy, unspecified: Secondary | ICD-10-CM | POA: Insufficient documentation

## 2017-07-27 DIAGNOSIS — I481 Persistent atrial fibrillation: Secondary | ICD-10-CM | POA: Diagnosis not present

## 2017-07-27 DIAGNOSIS — I5022 Chronic systolic (congestive) heart failure: Secondary | ICD-10-CM | POA: Diagnosis not present

## 2017-07-27 DIAGNOSIS — Z45010 Encounter for checking and testing of cardiac pacemaker pulse generator [battery]: Secondary | ICD-10-CM | POA: Diagnosis not present

## 2017-07-27 DIAGNOSIS — R0789 Other chest pain: Secondary | ICD-10-CM | POA: Insufficient documentation

## 2017-07-27 DIAGNOSIS — Z7982 Long term (current) use of aspirin: Secondary | ICD-10-CM | POA: Insufficient documentation

## 2017-07-27 DIAGNOSIS — I509 Heart failure, unspecified: Secondary | ICD-10-CM | POA: Diagnosis not present

## 2017-07-27 DIAGNOSIS — I10 Essential (primary) hypertension: Secondary | ICD-10-CM | POA: Insufficient documentation

## 2017-07-27 DIAGNOSIS — Z79899 Other long term (current) drug therapy: Secondary | ICD-10-CM | POA: Diagnosis not present

## 2017-07-27 DIAGNOSIS — E039 Hypothyroidism, unspecified: Secondary | ICD-10-CM | POA: Diagnosis not present

## 2017-07-27 DIAGNOSIS — I350 Nonrheumatic aortic (valve) stenosis: Secondary | ICD-10-CM | POA: Diagnosis not present

## 2017-07-27 DIAGNOSIS — Z8249 Family history of ischemic heart disease and other diseases of the circulatory system: Secondary | ICD-10-CM | POA: Diagnosis not present

## 2017-07-27 DIAGNOSIS — I495 Sick sinus syndrome: Secondary | ICD-10-CM | POA: Diagnosis not present

## 2017-07-27 DIAGNOSIS — K210 Gastro-esophageal reflux disease with esophagitis: Secondary | ICD-10-CM | POA: Diagnosis not present

## 2017-07-27 DIAGNOSIS — Z8673 Personal history of transient ischemic attack (TIA), and cerebral infarction without residual deficits: Secondary | ICD-10-CM | POA: Insufficient documentation

## 2017-07-27 HISTORY — PX: IMPLANTABLE CARDIOVERTER DEFIBRILLATOR (ICD) GENERATOR CHANGE: SHX5469

## 2017-07-27 SURGERY — ICD GENERATOR CHANGE
Anesthesia: Monitor Anesthesia Care | Site: Chest | Laterality: Left | Wound class: Clean

## 2017-07-27 MED ORDER — HEMOSTATIC AGENTS (NO CHARGE) OPTIME
TOPICAL | Status: DC | PRN
  Administered 2017-07-27: 1 via TOPICAL

## 2017-07-27 MED ORDER — PROPOFOL 500 MG/50ML IV EMUL
INTRAVENOUS | Status: AC
Start: 2017-07-27 — End: 2017-07-27
  Filled 2017-07-27: qty 50

## 2017-07-27 MED ORDER — FENTANYL CITRATE (PF) 100 MCG/2ML IJ SOLN
25.0000 ug | INTRAMUSCULAR | Status: DC | PRN
  Administered 2017-07-27 (×3): 25 ug via INTRAVENOUS

## 2017-07-27 MED ORDER — LACTATED RINGERS IV SOLN
INTRAVENOUS | Status: DC
  Administered 2017-07-27: 12:00:00 via INTRAVENOUS

## 2017-07-27 MED ORDER — CEFAZOLIN SODIUM-DEXTROSE 1-4 GM/50ML-% IV SOLN
INTRAVENOUS | Status: AC
  Filled 2017-07-27: qty 50

## 2017-07-27 MED ORDER — FENTANYL CITRATE (PF) 100 MCG/2ML IJ SOLN
INTRAMUSCULAR | Status: AC
  Administered 2017-07-27: 25 ug via INTRAVENOUS
  Filled 2017-07-27: qty 2

## 2017-07-27 MED ORDER — PROPOFOL 10 MG/ML IV BOLUS
INTRAVENOUS | Status: DC | PRN
  Administered 2017-07-27 (×2): 20 mg via INTRAVENOUS

## 2017-07-27 MED ORDER — FAMOTIDINE 20 MG PO TABS
20.0000 mg | ORAL_TABLET | Freq: Once | ORAL | Status: AC
  Administered 2017-07-27: 20 mg via ORAL

## 2017-07-27 MED ORDER — FAMOTIDINE 20 MG PO TABS
ORAL_TABLET | ORAL | Status: AC
  Administered 2017-07-27: 20 mg via ORAL
  Filled 2017-07-27: qty 1

## 2017-07-27 MED ORDER — HEPARIN SODIUM (PORCINE) 5000 UNIT/ML IJ SOLN: INTRAMUSCULAR | Status: DC | PRN

## 2017-07-27 MED ORDER — MEPERIDINE HCL 50 MG/ML IJ SOLN: 6.2500 mg | INTRAMUSCULAR | Status: DC | PRN

## 2017-07-27 MED ORDER — PROPOFOL 500 MG/50ML IV EMUL
INTRAVENOUS | Status: DC | PRN
  Administered 2017-07-27: 75 ug/kg/min via INTRAVENOUS

## 2017-07-27 MED ORDER — SODIUM CHLORIDE 0.9 % IR SOLN
Freq: Once | Status: DC
  Filled 2017-07-27: qty 2

## 2017-07-27 MED ORDER — LIDOCAINE 1 % OPTIME INJ - NO CHARGE
INTRAMUSCULAR | Status: DC | PRN
  Administered 2017-07-27: 25 mL

## 2017-07-27 MED ORDER — CEPHALEXIN 250 MG PO CAPS: 250.0000 mg | ORAL_CAPSULE | Freq: Four times a day (QID) | ORAL | 0 refills | Status: DC

## 2017-07-27 MED ORDER — ONDANSETRON HCL 4 MG/2ML IJ SOLN: 4.0000 mg | Freq: Once | INTRAMUSCULAR | Status: DC | PRN

## 2017-07-27 MED ORDER — EPHEDRINE SULFATE 50 MG/ML IJ SOLN
INTRAMUSCULAR | Status: DC | PRN
  Administered 2017-07-27 (×2): 10 mg via INTRAVENOUS
  Administered 2017-07-27: 5 mg via INTRAVENOUS

## 2017-07-27 MED ORDER — CEFAZOLIN SODIUM-DEXTROSE 1-4 GM/50ML-% IV SOLN
INTRAVENOUS | Status: DC | PRN
  Administered 2017-07-27: 1 g via INTRAVENOUS

## 2017-07-27 MED ORDER — PHENYLEPHRINE HCL 10 MG/ML IJ SOLN
INTRAMUSCULAR | Status: DC | PRN
  Administered 2017-07-27 (×2): 200 ug via INTRAVENOUS
  Administered 2017-07-27 (×2): 100 ug via INTRAVENOUS
  Administered 2017-07-27 (×2): 200 ug via INTRAVENOUS
  Administered 2017-07-27: 100 ug via INTRAVENOUS

## 2017-07-27 SURGICAL SUPPLY — 53 items
ADH SKN CLS APL DERMABOND .7 (GAUZE/BANDAGES/DRESSINGS) ×1
BAG DECANTER FOR FLEXI CONT (MISCELLANEOUS) ×3 IMPLANT
BLADE PHOTON ILLUMINATED (MISCELLANEOUS) ×3 IMPLANT
BLADE SURG SZ10 CARB STEEL (BLADE) ×3 IMPLANT
CANISTER SUCT 1200ML W/VALVE (MISCELLANEOUS) ×3 IMPLANT
CHLORAPREP W/TINT 26ML (MISCELLANEOUS) ×3 IMPLANT
CLOSURE WOUND 1/2 X4 (GAUZE/BANDAGES/DRESSINGS) ×1
COVER LIGHT HANDLE STERIS (MISCELLANEOUS) ×6 IMPLANT
DERMABOND ADVANCED (GAUZE/BANDAGES/DRESSINGS) ×2
DERMABOND ADVANCED .7 DNX12 (GAUZE/BANDAGES/DRESSINGS) IMPLANT
DRAPE C-ARM XRAY 36X54 (DRAPES) ×3 IMPLANT
DRAPE INCISE IOBAN 66X45 STRL (DRAPES) ×3 IMPLANT
DRAPE LAPAROTOMY 77X122 PED (DRAPES) ×3 IMPLANT
DRSG TEGADERM 4X4.75 (GAUZE/BANDAGES/DRESSINGS) ×3 IMPLANT
DRSG TEGADERM 6X8 (GAUZE/BANDAGES/DRESSINGS) ×3 IMPLANT
ELECT REM PT RETURN 9FT ADLT (ELECTROSURGICAL) ×3
ELECTRODE REM PT RTRN 9FT ADLT (ELECTROSURGICAL) ×1 IMPLANT
GLOVE BIO SURGEON STRL SZ7.5 (GLOVE) ×3 IMPLANT
GLOVE BIO SURGEON STRL SZ8 (GLOVE) ×3 IMPLANT
GOWN STRL REUS W/ TWL LRG LVL3 (GOWN DISPOSABLE) ×1 IMPLANT
GOWN STRL REUS W/ TWL XL LVL3 (GOWN DISPOSABLE) ×1 IMPLANT
GOWN STRL REUS W/TWL LRG LVL3 (GOWN DISPOSABLE) ×3
GOWN STRL REUS W/TWL XL LVL3 (GOWN DISPOSABLE) ×3
GRADUATE 1200CC STRL 31836 (MISCELLANEOUS) ×3 IMPLANT
HEMOSTAT SURGICEL 2X3 (HEMOSTASIS) ×2 IMPLANT
ICD CLARIA MRI DTMA1D1 (ICD Generator) ×2 IMPLANT
IV NS 1000ML (IV SOLUTION) ×3
IV NS 1000ML BAXH (IV SOLUTION) ×1 IMPLANT
KIT TURNOVER KIT A (KITS) ×3 IMPLANT
NDL FILTER BLUNT 18X1 1/2 (NEEDLE) ×1 IMPLANT
NDL HYPO 25X1 1.5 SAFETY (NEEDLE) ×1 IMPLANT
NDL SPNL 22GX3.5 QUINCKE BK (NEEDLE) ×1 IMPLANT
NEEDLE FILTER BLUNT 18X 1/2SAF (NEEDLE) ×2
NEEDLE FILTER BLUNT 18X1 1/2 (NEEDLE) ×1 IMPLANT
NEEDLE HYPO 25X1 1.5 SAFETY (NEEDLE) ×3 IMPLANT
NEEDLE SPNL 22GX3.5 QUINCKE BK (NEEDLE) ×3 IMPLANT
NS IRRIG 500ML POUR BTL (IV SOLUTION) ×3 IMPLANT
PACK BASIN MINOR ARMC (MISCELLANEOUS) ×3 IMPLANT
PACK PACE INSERTION (MISCELLANEOUS) ×3 IMPLANT
PAD STATPAD (MISCELLANEOUS) ×3 IMPLANT
PAD TELFA 2X3 NADH STRL (GAUZE/BANDAGES/DRESSINGS) ×2 IMPLANT
PIN PLUG IS-1 DEFIB (PIN) ×2 IMPLANT
STRAP SAFETY 5IN WIDE (MISCELLANEOUS) ×3 IMPLANT
STRIP CLOSURE SKIN 1/2X4 (GAUZE/BANDAGES/DRESSINGS) ×2 IMPLANT
SUT SILK 2 0 SH (SUTURE) ×3 IMPLANT
SUT VIC AB 2-0 CT1 27 (SUTURE) ×3
SUT VIC AB 2-0 CT1 TAPERPNT 27 (SUTURE) ×1 IMPLANT
SUT VIC AB 2-0 CT2 27 (SUTURE) ×3 IMPLANT
SUT VIC AB 3-0 PS2 18 (SUTURE) ×3 IMPLANT
SUT VIC AB 4-0 PS2 18 (SUTURE) ×3 IMPLANT
SYR 10ML LL (SYRINGE) ×3 IMPLANT
SYR BULB IRRIG 60ML STRL (SYRINGE) ×3 IMPLANT
SYR CONTROL 10ML (SYRINGE) ×3 IMPLANT

## 2017-07-27 NOTE — OR Nursing (Signed)
[employer] equip I box given to pt/friend with discharge instructions.  Friend advises rep reviewed with her.

## 2017-07-27 NOTE — Interval H&P Note (Signed)
History and Physical Interval Note:  07/27/2017 12:10 PM  Joanna Roberts  has presented today for surgery, with the diagnosis of SA NODE DYSFUNCTION  The various methods of treatment have been discussed with the patient and family. After consideration of risks, benefits and other options for treatment, the patient has consented to  Procedure(s): ICD GENERATOR CHANGE (Left) as a surgical intervention .  The patient's history has been reviewed, patient examined, no change in status, stable for surgery.  I have reviewed the patient's chart and labs.  Questions were answered to the patient's satisfaction.     [name] [employer]

## 2017-07-27 NOTE — Anesthesia Post-op Follow-up Note (Signed)
Anesthesia QCDR form completed.        

## 2017-07-27 NOTE — Anesthesia Post-op Follow-up Note (Deleted)
Anesthesia QCDR form completed.        

## 2017-07-27 NOTE — Op Note (Signed)
[facility]   07/27/2017                     2:14 PM  PATIENT:  Joanna Roberts    PRE-OPERATIVE DIAGNOSIS:  SA NODE DYSFUNCTION  POST-OPERATIVE DIAGNOSIS:  Same  PROCEDURE:  ICD GENERATOR CHANGE  SURGEON:  Marcina Millard, MD    ANESTHESIA:     PREOPERATIVE INDICATIONS:  [name] is a  84 y.o. female with a diagnosis of SA NODE DYSFUNCTION who failed conservative measures and elected for surgical management.    The risks benefits and alternatives were discussed with the patient preoperatively including but not limited to the risks of infection, bleeding, cardiopulmonary complications, the need for revision surgery, among others, and the patient was willing to proceed.   OPERATIVE PROCEDURE: The patient was brought to the operating room fasting state.  The left pectoral region was prepped and draped in usual sterile manner.  Anesthesia was obtained 1% lidocaine locally.  A 6 cm incision was performed the left pectoral region.  The ICD generator was retrieved by electrocautery and blunt dissection.  The leads were connected to a new BiV- ICD generator.  Pacemaker pocket was irrigated with gentamicin solution.  The new ICD generator was positioned into the pocket and the pocket was closed with 2-0 and 4-0 Vicryl, respectively.  Steri-Strips and pressure dressing were applied.  Postprocedural interrogation revealed appropriate RV and LV pacing thresholds.  There were no periprocedural complications.   ICD Criteria  Current LVEF:40%. Within 12 months prior to implant: Yes   Heart failure history: Yes, Class III  Cardiomyopathy history: Yes, Non-Ischemic Cardiomyopathy.  Atrial Fibrillation/Atrial Flutter: Yes, Persistent (> 7 Days).  Ventricular tachycardia history: No.  Cardiac arrest history: No.  History of syndromes with risk of sudden death: No.  Previous ICD: Yes, Reason for  ICD:  Primary prevention.  Current ICD indication: Primary  PPM indication: Yes. Pacing type: Ventricular. Greater than 40% RV pacing requirement anticipated. Indication: Sick Sinus Syndrome   Class I or II Bradycardia indication present: Yes  Beta Blocker therapy for 3 or more months: Yes, prescribed.   Ace Inhibitor/ARB therapy for 3 or more months: Yes, prescribed.

## 2017-07-27 NOTE — Anesthesia Preprocedure Evaluation (Addendum)
Anesthesia Evaluation  Patient identified by MRN, date of birth, ID band Patient awake    Reviewed: Allergy & Precautions, H&P , NPO status , reviewed documented beta blocker date and time   Airway Mallampati: II  TM Distance: >3 FB     Dental  (+) Chipped   Pulmonary pneumonia, resolved,    Pulmonary exam normal        Cardiovascular hypertension, + Past MI, + Peripheral Vascular Disease and +CHF  Normal cardiovascular exam+ dysrhythmias + pacemaker + Cardiac Defibrillator + Valvular Problems/Murmurs      Neuro/Psych PSYCHIATRIC DISORDERS Depression  Neuromuscular disease    GI/Hepatic hiatal hernia, GERD  Controlled,  Endo/Other  Hypothyroidism   Renal/GU      Musculoskeletal  (+) Arthritis ,   Abdominal   Peds  Hematology  (+) anemia ,   Anesthesia Other Findings   Reproductive/Obstetrics                             Anesthesia Physical Anesthesia Plan  ASA: III  Anesthesia Plan: MAC   Post-op Pain Management:    Induction:   PONV Risk Score and Plan: 2 and Propofol infusion  Airway Management Planned:   Additional Equipment:   Intra-op Plan:   Post-operative Plan:   Informed Consent: I have reviewed the patients History and Physical, chart, labs and discussed the procedure including the risks, benefits and alternatives for the proposed anesthesia with the patient or authorized representative who has indicated his/her understanding and acceptance.   Dental Advisory Given  Plan Discussed with:   Anesthesia Plan Comments:         Anesthesia Quick Evaluation

## 2017-07-27 NOTE — Anesthesia Procedure Notes (Signed)
Performed by: Carolie Mcilrath, CRNA Pre-anesthesia Checklist: Patient identified, Emergency Drugs available, Suction available, Patient being monitored and Timeout performed Patient Re-evaluated:Patient Re-evaluated prior to induction Oxygen Delivery Method: Nasal cannula Induction Type: IV induction       

## 2017-07-27 NOTE — Transfer of Care (Signed)
Immediate Anesthesia Transfer of Care Note  Patient: Joanna Roberts  Procedure(s) Performed: ICD GENERATOR CHANGE (Left Chest)  Patient Location: PACU  Anesthesia Type:MAC  Level of Consciousness: awake, alert  and oriented  Airway & Oxygen Therapy: Patient Spontanous Breathing and Patient connected to nasal cannula oxygen  Post-op Assessment: Report given to RN and Post -op Vital signs reviewed and stable  Post vital signs: Reviewed  Last Vitals:  Vitals Value Taken Time  BP    Temp    Pulse    Resp    SpO2      Last Pain:  Vitals:   07/27/17 1106  TempSrc: Temporal  PainSc: 7          Complications: No apparent anesthesia complications

## 2017-07-27 NOTE — OR Nursing (Signed)
Taking sips of soda tolerated well 

## 2017-07-27 NOTE — H&P (Signed)
Jump to Section ? Document InformationEncounter DetailsLast Filed Vital SignsOrdered PrescriptionsPatient ContactsPatient DemographicsPlan of TreatmentProgress NotesReason for VisitSocial HistoryVisit Diagnoses Joanna Roberts Encounter Summary, generated on Apr. 03, 2019April 03, 2019 Printout Information  Document Contents Document Received Date Document Source Organization  Office Visit Apr. 03, 2019April 03, 2019 [facility]   Patient Demographics - 84 y.o. Female; born [birth year]   Patient Address Communication Language Race / Ethnicity  [address] [place], [place] [address] [phone] [facility]) [phone] (Home) English (Preferred) White / Not Hispanic or Latino   Reason for Visit   Reason Comments  Follow-up Echo  Pacemaker Problem was told it was ERI  Leg Swelling did see Dr Gilda Crease yesterday wearing my stockings and keeping them elevated    Encounter Details   Date Type Department Care Team Description  07/19/2017 Office Visit [facility]  [address]  [place], [place] [address]  [phone]  Denton Ar, MD  [address] [facility] [address]  [facility] [address] - CARDIOLOGY  [place], [place] [address]  [phone]  [phone] (Fax)  SOB (shortness of breath) (Primary Dx);  Chronic atrial fibrillation (CMS-HCC);  Cardiomyopathy, unspecified type (CMS-HCC);  Chronic mesenteric ischemia (CMS-HCC);  Chronic venous insufficiency;  Essential hypertension;  Peripheral vascular disease of lower extremity - followed by Dr. Lorretta Harp   Social History - documented as of this encounter  Tobacco Use Types Packs/Day Years Used Date  Never Smoker      Smokeless Tobacco: Never Used      Alcohol Use Drinks/Week oz/Week Comments  No      Sex Assigned at [employer] Date Recorded  Not on file    Job Start Date Occupation Industry  Not on file Not on file Not on file   Travel History Travel Start  Travel End  No recent travel history available.     Last Filed Vital Signs - documented in this encounter  Vital Sign Reading Time Taken  Blood Pressure 116/68 07/19/2017 9:39 AM EDT  Pulse 66 07/19/2017 9:39 AM EDT  Temperature - -  Respiratory Rate 12 07/19/2017 9:39 AM EDT  Oxygen Saturation - -  Inhaled Oxygen Concentration - -  Weight 58.5 kg (129 lb) 07/19/2017 9:39 AM EDT  Height 162.6 cm (5\' 4" ) 07/19/2017 9:39 AM EDT  Body Mass Index 22.14 07/19/2017 9:39 AM EDT   Ordered Prescriptions - documented in this encounter  Prescription Sig Dispensed Refills Start Date End Date  FUROsemide (LASIX) 20 MG tablet  Take 1 tablet (20 mg total) by mouth once daily 30 tablet  11 07/19/2017 07/19/2018   Progress Notes - documented in this encounter  Denton Ar, MD - 07/19/2017 9:30 AM EDT Formatting of this note might be different from the original.   Chief Complaint: Chief Complaint  Patient presents with  . Follow-up  Echo  . Pacemaker Problem  was told it was ERI  . Leg Swelling  did see Dr Gilda Crease yesterday wearing my stockings and keeping them elevated  Date of Service: 07/19/2017 Date of Birth: 01-25-1929 PCP: Loletha Carrow, PA  History of Present Illness: Joanna Roberts is a 84 y.o.female patient who returns for follow-up visit. She has a biventricular AICD which is functioning normally although interrogation today reveals voltage is at ERI. Voltage today is 2.61 V. RRT is 2.63 V.. She has a history of lymphedema and has a lot of swelling in her right leg. She had a echocardiogram in April 2018 which  revealed normal LV function with moderate aortic stenosis moderate MR moderate severe TR. She has been taking hydrocodone for her leg pain. She has been seen by vascular surgery regarding her lymphedema and ulcerations on her lower extremities. She states her shortness of breath is increased. The device suggest possible volume overload. We will add low-dose  furosemide in addition to her hydrochlorothiazide to see if this will help. Past Medical and Surgical History  Past Medical History Past Medical History:  Diagnosis Date  . Abdominal pain, recurrent, unspecified 03/01/2016  . Anemia, unspecified  . Atrial fibrillation , unspecified (CMS-HCC)  . Bright red rectal bleeding 03/01/2016  . C. difficile diarrhea 03/01/2016  . Cardiomyopathy (CMS-HCC)  AICD with pacemaker placement  . Chronic abdominal pain, unspecified 01/17/2014  . Chronic back pain, unspecified  . Colon polyp  . Congestive heart failure (CMS-HCC)  . Constipation 01/17/2014  . Depression, unspecified  . Diverticulosis  . Fatty infiltration of liver 01/17/2014  . Grief reaction  after son committed suicide, 03/2013  . Hiatal hernia  . History of colonic polyps  . History of hydronephrosis  . History of nephrolithiasis  . History of recurrent UTIs  . Hx of degenerative disc disease  . Hypothyroidism, unspecified  . Insomnia  . Peripheral vascular disease (CMS-HCC)  Status post percutaneous lower extremity intervention per vascular surgery  . Rectal abnormality 03/01/2016  . Reflux esophagitis  . Tachy-brady syndrome (CMS-HCC)   Past Surgical History She has a past surgical history that includes Hernia repair; Appendectomy; Carotid endarterectomy (Right); vein stripping (Right); Defibrillator and pacemaker placement; Colonoscopy; Upper gastrointestinal endoscopy; Tonsillectomy; Hemorrhoidectomy; Insert / replace / remove pacemaker; Vascular balloon surgery (03/2014); viceral angiography; angiography visceral; and Hysterectomy (1970).   Medications and Allergies  Current Medications  Current Outpatient Medications  Medication Sig Dispense Refill  . aspirin 81 MG EC tablet Take 81 mg by mouth once daily  . carvedilol (COREG) 25 MG tablet TAKE ONE TABLET BY MOUTH TWICE DAILY 180 tablet 1  . clopidogrel (PLAVIX) 75 mg tablet Take by mouth Take one tab by mouth  .  cyanocobalamin (VITAMIN B12) 1000 MCG tablet Take 1,000 mcg by mouth once daily.  . diphenoxylate-atropine (LOMOTIL) 2.5-0.025 mg tablet Take 1 tablet by mouth 4 (four) times daily as needed for Diarrhea.  . hydroCHLOROthiazide (HYDRODIURIL) 25 MG tablet Take 1 tablet (25 mg total) by mouth once daily. 30 tablet 11  . levothyroxine (SYNTHROID, LEVOTHROID) 25 MCG tablet TAKE 1 TABLET EVERY DAY ON EMPTY STOMACHWITH A GLASS OF WATER AT LEAST 30-60 MINBEFORE BREAKFAST 30 tablet 5  . losartan (COZAAR) 25 MG tablet TAKE ONE TABLET BY MOUTH EVERY DAY 30 tablet 5  . nitroGLYcerin (NITROSTAT) 0.4 MG SL tablet Place 1 tablet (0.4 mg total) under the tongue every 5 (five) minutes as needed for Chest pain. May take up to 3 doses. 25 tablet 0  . ondansetron (ZOFRAN) 4 MG tablet Take 4 mg by mouth every 8 (eight) hours as needed for Nausea.  Marland Kitchen oxyCODONE (ROXICODONE) 5 MG immediate release tablet Take 1 tablet (5 mg total) by mouth 3 (three) times daily as needed for Pain. Earliest Fill Date: 12/15/16 30 tablet 0  . triamcinolone 0.1 % cream Apply topically 2 (two) times daily 45 g 1  . acetaminophen (TYLENOL) 500 MG tablet Take by mouth  . FUROsemide (LASIX) 20 MG tablet Take 1 tablet (20 mg total) by mouth once daily 30 tablet 11   No current facility-administered medications for this visit.  Allergies: Doxycycline; Lortab [hydrocodone-acetaminophen]; and Macrobid [nitrofurantoin monohyd/m-cryst]  Social and Family History  Social History reports that she has never smoked. She has never used smokeless tobacco. She reports that she does not drink alcohol or use drugs.  Family History Family History  Problem Relation Age of Onset  . Coronary Artery Disease (Blocked arteries around heart) Mother  . Stroke Sister  . Asthma Father  . Myocardial Infarction (Heart attack) Brother  . Colon cancer Daughter   Review of Systems  Review of Systems  Constitutional: Negative for chills, diaphoresis, fever,  malaise/fatigue and weight loss.  HENT: Negative for congestion, ear discharge, hearing loss and tinnitus.  Eyes: Negative for blurred vision.  Respiratory: Positive for shortness of breath. Negative for cough, hemoptysis, sputum production and wheezing.  Cardiovascular: Positive for chest pain. Negative for palpitations, orthopnea, claudication, leg swelling and PND.  Gastrointestinal: Negative for abdominal pain, blood in stool, constipation, diarrhea, heartburn, melena, nausea and vomiting.  Genitourinary: Negative for dysuria, frequency, hematuria and urgency.  Musculoskeletal: Negative for back pain, falls, joint pain and myalgias.  Skin: Negative for itching and rash.  Neurological: Negative for dizziness, tingling, focal weakness, loss of consciousness, weakness and headaches.  Endo/Heme/Allergies: Negative for polydipsia. Does not bruise/bleed easily.  Psychiatric/Behavioral: Negative for memory loss, substance abuse and suicidal ideas. The patient is not nervous/anxious.    Physical Examination   Vitals: BP 116/68  Pulse 66  Resp 12  Ht 162.6 cm (5\' 4" )  Wt 58.5 kg (129 lb)  BMI 22.14 kg/m  Ht:162.6 cm (5\' 4" ) Wt:58.5 kg (129 lb) surface area is 1.63 meters squared. Body mass index is 22.14 kg/m.  Wt Readings from Last 3 Encounters:  07/19/17 58.5 kg (129 lb)  07/14/17 57.9 kg (127 lb 9.6 oz)  05/26/17 57.2 kg (126 lb)   BP Readings from Last 3 Encounters:  07/19/17 116/68  07/14/17 114/62  05/26/17 116/74  general: Caucasian female in no acute distress  LUNGS Breath Sounds: Normal Percussion: Normal  CARDIOVASCULAR JVP CV wave: no HJR: no Elevation at 90 degrees: None Carotid Pulse: normal pulsation bilaterally Bruit: None Apex: apical impulse normal  Auscultation Rhythm: atrial fibrillation and normal pacemaker rhythm S1: normal S2: normal Clicks: no Rub: no Murmurs: 2/6 medium pitched mid systolic blowing at lower left sternal border    Gallop: None ABDOMEN Liver enlargement: no Pulsatile aorta: no Ascites: no Bruits: no  EXTREMITIES Clubbing: no Edema: trace to 1+ bilateral pedal edema Pulses: peripheral pulses symmetrical Femoral Bruits: no Amputation: no SKIN Rash: no Cyanosis: no Embolic phemonenon: no Bruising: no NEURO Alert and Oriented to person, place and time: yes Non focal: yes LABS Last 3 CBC results: Lab Results  Component Value Date  WBC 6.8 12/08/2016  WBC 6.3 11/15/2016  WBC 6.2 09/16/2016   Lab Results  Component Value Date  HGB 11.2 (L) 12/08/2016  HGB 10.3 (L) 11/15/2016  HGB 9.4 (L) 09/16/2016   Lab Results  Component Value Date  HCT 36.0 12/08/2016  HCT 33.1 (L) 11/15/2016  HCT 29.9 (L) 09/16/2016   Lab Results  Component Value Date  PLT 151 12/08/2016  PLT 174 11/15/2016  PLT 192 09/16/2016   Lab Results  Component Value Date  CREATININE 1.1 12/08/2016  BUN 19 12/08/2016  NA 139 12/08/2016  K 5.0 12/08/2016  CL 104 12/08/2016  CO2 32.1 (H) 12/08/2016   Lab Results  Component Value Date  ALT 9 12/08/2016  AST 14 12/08/2016  ALKPHOS 64 12/08/2016   Assessment and  Plan   84 y.o. female with  ICD-10-CM ICD-9-CM  1. Atypical chest pain-etiology of chest pain is unclear. EKG is ventricular paced with occasional PVC. Etiology of the pain is atypical for angina. Echo done earlier this year showed moderate aortic stenosis with fairly well preserved LV function. Echocardiogram shows EF around 40%. This is not appreciably changed from previously. Aortic valvular disease has not changed. Appears to be moderately stenosed. R07.89 786.59  2. Persistent atrial fibrillation-currently stable. Remain off chronic anticoagulation due to the spontaneous hematoma that occurred on her right thigh. Bleeding risk appears to exceed potential benefits. I48.1 427.31  3. Cardiomyopathy-no evidence of heart failure on echo with preserved LV function. She has moderate aortic stenosis.  Will continue with hydrochlorothiazide and add low-dose Lasix to see if this will improve her symptoms. I42.9 425.4  4. Tachy-brady syndrome-pacemaker functioning normally.-Pacemaker generator is at ERI. We will schedule generator change. I49.5 427.81  5. Transient cerebral ischemia, unspecified transient cerebral ischemia type G45.9 435.9  6. Peripheral vascular disease of lower extremity-being followed by vascular surgery. I73.9 443.9   Return in about 1 month (around 08/16/2017).  These notes generated with voice recognition software. I apologize for typographical errors.  Denton Ar, MD    Electronically signed by Denton Ar, MD at 07/19/2017 10:30 AM EDT     Plan of Treatment - documented as of this encounter  Upcoming Encounters Upcoming Encounters  Date Type Specialty Care Team Description  08/03/2017 Ancillary Orders Lab Loletha Carrow, PA  [address]  [facility] [place], [place] [address]  [phone]  [phone] (Fax)    08/10/2017 Office Visit Internal Medicine Maurine Minister [place], [place]  [address]  [facility] [place], [place] [address]  [phone]  [phone] (Fax)    08/10/2017 Office Visit Gastroenterology Pernell Dupre, MD  [address]  [place], [place] [address]  [phone]  [phone] (Fax)    08/11/2017 Office Visit Obstetrics and Gynecology Cecilie Kicks, MD  [address]  [place], [place] [address]  [phone]  [phone] (Fax)    10/04/2017 Office Visit Cardiology Fath, Glenetta Borg, MD  [address]  [facility] - CARDIOLOGY  [place], [place] [address]  [phone]  [phone] (Fax)     Scheduled Tests Scheduled Tests  Name Priority Associated Diagnoses Order Schedule  Basic Metabolic Panel (BMP) Routine SOB (shortness of breath)  Ordered: 07/19/2017  CBC w/auto Differential (5 Part)  Routine SOB (shortness of breath)  Ordered: 07/19/2017   Visit Diagnoses - documented in this encounter  Diagnosis  SOB (shortness of breath) - Primary  Shortness of breath   Chronic atrial fibrillation (CMS-HCC)  Chronic atrial fibrillation   Cardiomyopathy, unspecified type (CMS-HCC)   Chronic mesenteric ischemia (CMS-HCC)  Chronic vascular insufficiency of intestine   Chronic venous insufficiency  Unspecified venous (peripheral) insufficiency   Essential hypertension   Peripheral vascular disease of lower extremity - followed by Dr. Lorretta Harp    Images Patient Contacts   Contact Name Contact Address Communication Relationship to Patient  Joanna Roberts, Joanna Roberts Unknown [phone] [facility]) Son or Daughter, Emergency Contact   Document Information  Primary Care Provider Other Service Providers Document Coverage Dates  Loletha Carrow PA (Oct. 18, 2017October 18, 2017 - Present) [phone] (Work) [phone] (Fax) [address] [facility] [place], [place] [address]   Mar. 26, 2019March 26, 2019   Custodian Organization  [facility] [address] [place], [place] [address]  Encounter Providers Encounter Date  Denton Ar MD (Attending) [phone] (Work) [phone] (Fax) [phone] [facility] [address] [facility] - CARDIOLOGY [place], [place] [address]  Mar. 26, 2019March 26, 2019    Show All Sections

## 2017-07-27 NOTE — Discharge Instructions (Addendum)
Leave steri-strips on till follow up visit. May shower 07/28/2017.    AMBULATORY SURGERY  DISCHARGE INSTRUCTIONS   1) The drugs that you were given will stay in your system until tomorrow so for the next 24 hours you should not:  A) Drive an automobile B) Make any legal decisions C) Drink any alcoholic beverage   2) You may resume regular meals tomorrow.  Today it is better to start with liquids and gradually work up to solid foods.  You may eat anything you prefer, but it is better to start with liquids, then soup and crackers, and gradually work up to solid foods.   3) Please notify your doctor immediately if you have any unusual bleeding, trouble breathing, redness and pain at the surgery site, drainage, fever, or pain not relieved by medication.    4) Additional Instructions:        Please contact your physician with any problems or Same Day Surgery at [phone], Monday through Friday 6 am to 4 pm, or [place] at [facility] number at [phone].

## 2017-07-28 ENCOUNTER — Encounter: Payer: Self-pay | Admitting: Cardiology

## 2017-07-28 DIAGNOSIS — N6489 Other specified disorders of breast: Secondary | ICD-10-CM | POA: Diagnosis not present

## 2017-07-28 DIAGNOSIS — I429 Cardiomyopathy, unspecified: Secondary | ICD-10-CM | POA: Diagnosis not present

## 2017-07-28 DIAGNOSIS — I482 Chronic atrial fibrillation: Secondary | ICD-10-CM | POA: Diagnosis not present

## 2017-07-28 DIAGNOSIS — I495 Sick sinus syndrome: Secondary | ICD-10-CM | POA: Diagnosis not present

## 2017-07-28 DIAGNOSIS — I10 Essential (primary) hypertension: Secondary | ICD-10-CM | POA: Diagnosis not present

## 2017-07-28 NOTE — Anesthesia Postprocedure Evaluation (Signed)
Anesthesia Post Note  Patient: Joanna Roberts  Procedure(s) Performed: ICD GENERATOR CHANGE (Left Chest)  Patient location during evaluation: PACU Anesthesia Type: MAC Level of consciousness: awake and alert Pain management: pain level controlled Vital Signs Assessment: post-procedure vital signs reviewed and stable Respiratory status: spontaneous breathing, nonlabored ventilation and respiratory function stable Cardiovascular status: blood pressure returned to baseline and stable Postop Assessment: no apparent nausea or vomiting Anesthetic complications: no     Last Vitals:  Vitals:   07/27/17 1529 07/27/17 1557  BP: 105/79 111/79  Pulse: 69 70  Resp: 16 16  Temp: 36.5 C 36.8 C  SpO2: 96% 98%    Last Pain:  Vitals:   07/27/17 1557  TempSrc: Temporal  PainSc: 0-No pain                 Alphonsus Sias

## 2017-07-30 ENCOUNTER — Encounter (INDEPENDENT_AMBULATORY_CARE_PROVIDER_SITE_OTHER): Payer: Self-pay | Admitting: Vascular Surgery

## 2017-07-30 NOTE — Progress Notes (Signed)
MRN : [id number]  Joanna Roberts is a 84 y.o. ([birth year]) female who presents with chief complaint of  Chief Complaint  Patient presents with  . Carotid    6 month f/u  .  History of Present Illness: The patient is seen for follow up evaluation of carotid stenosis. The carotid stenosis followed by ultrasound.   The patient denies amaurosis fugax. There is no recent history of TIA symptoms or focal motor deficits. There is no prior documented CVA.  The patient is taking enteric-coated aspirin 81 mg daily.  There is no history of migraine headaches. There is no history of seizures.  The patient has a history of coronary artery disease, no recent episodes of angina or shortness of breath. The patient denies PAD or claudication symptoms. There is a history of hyperlipidemia which is being treated with a statin.    Carotid Duplex done today shows <50%.  No change compared to last study  Current Meds  Medication Sig  . triamcinolone cream (KENALOG) 0.1 % Apply 1 application topically 2 (two) times daily as needed (for rash/skin irritation.).   . [DISCONTINUED] clopidogrel (PLAVIX) 75 MG tablet Take by mouth.    Past Medical History:  Diagnosis Date  . AICD (automatic cardioverter/defibrillator) present   . Allergy   . Anemia   . Arthritis   . Cataract   . CHF (congestive heart failure) (HCC)   . Depression   . Dysrhythmia   . GERD (gastroesophageal reflux disease)   . Heart murmur   . History of TIAs   . Hypertension   . Hypothyroidism   . Myocardial infarction (HCC)   . Osteoporosis   . Presence of permanent cardiac pacemaker   . Thyroid disease     Past Surgical History:  Procedure Laterality Date  . ABDOMINAL HYSTERECTOMY    . APPENDECTOMY    . HERNIA REPAIR    . IMPLANTABLE CARDIOVERTER DEFIBRILLATOR (ICD) GENERATOR CHANGE Left 07/27/2017   Procedure: ICD GENERATOR CHANGE;  Surgeon: Marcina Millard, MD;  Location: [facility];  Service: Cardiovascular;   Laterality: Left;  . INSERT / REPLACE / REMOVE PACEMAKER    . LOWER EXTREMITY ANGIOGRAPHY Left 02/08/2017   Procedure: Lower Extremity Angiography;  Surgeon: Renford Dills, MD;  Location: [facility];  Service: Cardiovascular;  Laterality: Left;  . PERIPHERAL VASCULAR CATHETERIZATION Right 10/08/2014   Procedure: Lower Extremity Angiography;  Surgeon: Renford Dills, MD;  Location: [facility];  Service: Cardiovascular;  Laterality: Right;  . PERIPHERAL VASCULAR CATHETERIZATION  10/08/2014   Procedure: Lower Extremity Intervention;  Surgeon: Renford Dills, MD;  Location: [facility];  Service: Cardiovascular;;  . PERIPHERAL VASCULAR CATHETERIZATION N/A 03/23/2016   Procedure: Visceral Angiography;  Surgeon: Renford Dills, MD;  Location: [facility];  Service: Cardiovascular;  Laterality: N/A;  . pessary      Social History Social History   Tobacco Use  . Smoking status: Never Smoker  . Smokeless tobacco: Never Used  Substance Use Topics  . Alcohol use: No  . Drug use: No    Family History Family History  Problem Relation Age of Onset  . Early death Mother   . Heart disease Mother   . Arthritis Father   . Asthma Father   . Stroke Sister     Allergies  Allergen Reactions  . Lortab [Hydrocodone-Acetaminophen] Itching, Swelling and Rash  . Doxycycline Diarrhea  . Macrobid [Nitrofurantoin Monohyd Macro] Diarrhea and Nausea And  Vomiting  . Nitrofurantoin Diarrhea, Nausea Only and Nausea And Vomiting     REVIEW OF SYSTEMS (Negative unless checked)  Constitutional: [] Weight loss  [] Fever  [] Chills Cardiac: [] Chest pain   [] Chest pressure   [] Palpitations   [] Shortness of breath when laying flat   [] Shortness of breath with exertion. Vascular:  [x] Pain in legs with walking   [x] Pain in legs at rest  [] History of DVT   [] Phlebitis   [x] Swelling in legs   [] Varicose veins   [] Non-healing ulcers Pulmonary:   [] Uses home oxygen    [] Productive cough   [] Hemoptysis   [] Wheeze  [] COPD   [] Asthma Neurologic:  [] Dizziness   [] Seizures   [] History of stroke   [] History of TIA  [] Aphasia   [] Vissual changes   [] Weakness or numbness in arm   [] Weakness or numbness in leg Musculoskeletal:   [] Joint swelling   [] Joint pain   [] Low back pain Hematologic:  [] Easy bruising  [] Easy bleeding   [] Hypercoagulable state   [] Anemic Gastrointestinal:  [] Diarrhea   [] Vomiting  [] Gastroesophageal reflux/heartburn   [] Difficulty swallowing. Genitourinary:  [] Chronic kidney disease   [] Difficult urination  [] Frequent urination   [] Blood in urine Skin:  [x] Rashes   [] Ulcers  Psychological:  [] History of anxiety   []  History of major depression.  Physical Examination  Vitals:   07/18/17 1530  BP: 119/79  Pulse: 76  Resp: 14  Weight: 129 lb (58.5 kg)  Height: 5\' 3"  (1.6 m)   Body mass index is 22.85 kg/m. Gen: WD/WN, NAD Head: [place]/AT, No temporalis wasting.  Ear/Nose/Throat: Hearing grossly intact, nares w/o erythema or drainage Eyes: PER, EOMI, sclera nonicteric.  Neck: Supple, no large masses.   Pulmonary:  Good air movement, no audible wheezing bilaterally, no use of accessory muscles.  Cardiac: RRR, no JVD Vascular: scattered varicosities present bilaterally.  Severe venous stasis changes to the legs bilaterally.  2+ soft pitting edema Vessel Right Left  Radial Palpable Palpable  PT Palpable Palpable  DP Palpable Palpable  Gastrointestinal: Non-distended. No guarding/no peritoneal signs.  Musculoskeletal: M/S 5/5 throughout.  No deformity or atrophy.  Neurologic: CN 2-12 intact. Symmetrical.  Speech is fluent. Motor exam as listed above. Psychiatric: Judgment intact, Mood & affect appropriate for pt's clinical situation. Dermatologic: Severe venous rashes no ulcers noted.  No changes consistent with cellulitis. Lymph : No lichenification or skin changes of chronic lymphedema.  CBC Lab Results  Component Value Date   WBC  4.8 07/21/2017   HGB 11.1 (L) 07/21/2017   HCT 34.1 (L) 07/21/2017   MCV 96.9 07/21/2017   PLT 130 (L) 07/21/2017    BMET    Component Value Date/Time   NA 137 07/21/2017 1049   NA 140 04/23/2014 1320   K 4.4 07/21/2017 1049   K 4.8 04/23/2014 1320   CL 101 07/21/2017 1049   CL 109 (H) 04/23/2014 1320   CO2 28 07/21/2017 1049   CO2 26 04/23/2014 1320   GLUCOSE 113 (H) 07/21/2017 1049   GLUCOSE 95 04/23/2014 1320   BUN 19 07/21/2017 1049   BUN 22 (H) 04/23/2014 1320   CREATININE 1.04 (H) 07/21/2017 1049   CREATININE 1.02 04/23/2014 1320   CALCIUM 9.1 07/21/2017 1049   CALCIUM 8.5 04/23/2014 1320   GFRNONAA 47 (L) 07/21/2017 1049   GFRNONAA 55 (L) 04/23/2014 1320   GFRNONAA 39 (L) 11/02/2013 0540   GFRAA 55 (L) 07/21/2017 1049   GFRAA >60 04/23/2014 1320   GFRAA 45 (L) 11/02/2013 0540  Estimated Creatinine Clearance: 32.1 mL/min (A) (by C-G formula based on SCr of 1.04 mg/dL (H)).  COAG Lab Results  Component Value Date   INR 1.10 07/21/2017   INR 1.23 08/29/2016   INR 1.17 08/28/2016    Radiology Dg Chest 2 View  Result Date: 07/21/2017 CLINICAL DATA:  Examination for generator change out next Wednesday. History of CHF, previous MI. EXAM: CHEST - 2 VIEW COMPARISON:  Chest x-ray of Aug 27, 2016 and chest CT scan of November 17, 2016 FINDINGS: The lungs are well expanded. The interstitial markings are increased with areas of patchy confluence noted in the left upper lobe. The cardiac silhouette is enlarged. The pulmonary vascularity is engorged. There is calcification in the wall of the tortuous ascending and descending thoracic aorta. There is high-grade wedge compression of T12 which is chronic. The ICD electrodes are in stable position. The generator overlies the lower left pectoral region. IMPRESSION: CHF with mild interstitial edema bilaterally and early alveolar edema in the upper lobe. Thoracic aortic atherosclerosis. Electronically Signed   By: David  [place] M.D.   On:  07/21/2017 13:56    Assessment/Plan 1. Bilateral carotid artery stenosis Recommend:  Given the patient's asymptomatic subcritical stenosis no further invasive testing or surgery at this time.  Duplex ultrasound shows <50% stenosis bilaterally.  Continue antiplatelet therapy as prescribed Continue management of CAD, HTN and Hyperlipidemia Healthy heart diet,  encouraged exercise at least 4 times per week Follow up in 12 months with duplex ultrasound and physical exam   - VAS US CAROTID; Future  2. Atherosclerosis of native arteries of the extremities with ulceration (HCC)  Recommend:  The patient has evidence of atherosclerosis of the lower extremities with claudication.  The patient does not voice lifestyle limiting changes at this point in time.  Noninvasive studies do not suggest clinically significant change.  No invasive studies, angiography or surgery at this time The patient should continue walking and begin a more formal exercise program.  The patient should continue antiplatelet therapy and aggressive treatment of the lipid abnormalities  No changes in the patient's medications at this time  The patient should continue wearing graduated compression socks 10-15 mmHg strength to control the mild edema.    3. Chronic venous insufficiency No surgery or intervention at this point in time.    I have had a long discussion with the patient regarding venous insufficiency and why it  causes symptoms. I have discussed with the patient the chronic skin changes that accompany venous insufficiency and the long term sequela such as infection and ulceration.  Patient will begin wearing graduated compression stockings class 1 (20-30 mmHg) or compression wraps on a daily basis a prescription was given. The patient will put the stockings on first thing in the morning and removing them in the evening. The patient is instructed specifically not to sleep in the stockings.    In addition,  behavioral modification including several periods of elevation of the lower extremities during the day will be continued. I have demonstrated that proper elevation is a position with the ankles at heart level.  The patient is instructed to begin routine exercise, especially walking on a daily basis   4. Chronic atrial fibrillation (HCC) Continue antiarrhythmia medications as already ordered, these medications have been reviewed and there are no changes at this time.  Continue anticoagulation as ordered by Cardiology Service   5. DDD (degenerative disc disease), lumbar Continue NSAID medications as already ordered, these medications have been reviewed and  there are no changes at this time.  Continued activity and therapy was stressed.     Levora Dredge, MD  07/30/2017 5:05 PM

## 2017-08-02 DIAGNOSIS — R238 Other skin changes: Secondary | ICD-10-CM | POA: Diagnosis not present

## 2017-08-02 DIAGNOSIS — D649 Anemia, unspecified: Secondary | ICD-10-CM | POA: Diagnosis not present

## 2017-08-02 DIAGNOSIS — I481 Persistent atrial fibrillation: Secondary | ICD-10-CM | POA: Diagnosis not present

## 2017-08-07 DIAGNOSIS — M75121 Complete rotator cuff tear or rupture of right shoulder, not specified as traumatic: Secondary | ICD-10-CM | POA: Diagnosis not present

## 2017-08-08 DIAGNOSIS — Z4689 Encounter for fitting and adjustment of other specified devices: Secondary | ICD-10-CM | POA: Diagnosis not present

## 2017-08-11 DIAGNOSIS — R0602 Shortness of breath: Secondary | ICD-10-CM | POA: Diagnosis not present

## 2017-08-15 ENCOUNTER — Telehealth: Payer: Self-pay

## 2017-08-15 NOTE — Telephone Encounter (Signed)
Patient called and stated she is ready to come off of her pain medication (Percocet 5/325mg ) and wanted to consult with Dr Cherylann Ratel before doing so. Patient states she takes medications whenever she needs it. Last dose was 1/2 of tablet on 08/14/17. Will consult with Dr Cherylann Ratel and call pt back.  Called patient back at approx 1330 and informed patient that Dr Cherylann Ratel stated that she could stop medication. Instructed pt to call  If needed and can always go to the ER. Pt states she has appt with Dr Marliss Czar in 3 weeks

## 2017-08-16 DIAGNOSIS — R110 Nausea: Secondary | ICD-10-CM | POA: Diagnosis not present

## 2017-08-16 DIAGNOSIS — I481 Persistent atrial fibrillation: Secondary | ICD-10-CM | POA: Diagnosis not present

## 2017-08-16 DIAGNOSIS — I429 Cardiomyopathy, unspecified: Secondary | ICD-10-CM | POA: Diagnosis not present

## 2017-08-16 DIAGNOSIS — K2900 Acute gastritis without bleeding: Secondary | ICD-10-CM | POA: Diagnosis not present

## 2017-08-19 ENCOUNTER — Other Ambulatory Visit: Payer: Self-pay | Admitting: Physician Assistant

## 2017-08-19 ENCOUNTER — Ambulatory Visit
Admission: RE | Admit: 2017-08-19 | Discharge: 2017-08-19 | Disposition: A | Discharge status: Home / Self Care | Payer: PPO | Source: Ambulatory Visit | Attending: Physician Assistant | Admitting: Physician Assistant

## 2017-08-19 DIAGNOSIS — I712 Thoracic aortic aneurysm, without rupture: Secondary | ICD-10-CM | POA: Diagnosis not present

## 2017-08-19 DIAGNOSIS — J449 Chronic obstructive pulmonary disease, unspecified: Secondary | ICD-10-CM | POA: Insufficient documentation

## 2017-08-19 DIAGNOSIS — I2510 Atherosclerotic heart disease of native coronary artery without angina pectoris: Secondary | ICD-10-CM | POA: Diagnosis not present

## 2017-08-19 DIAGNOSIS — R079 Chest pain, unspecified: Secondary | ICD-10-CM | POA: Diagnosis present

## 2017-08-19 DIAGNOSIS — Z950 Presence of cardiac pacemaker: Secondary | ICD-10-CM | POA: Diagnosis not present

## 2017-08-19 DIAGNOSIS — R0602 Shortness of breath: Secondary | ICD-10-CM | POA: Diagnosis not present

## 2017-08-19 DIAGNOSIS — I313 Pericardial effusion (noninflammatory): Secondary | ICD-10-CM | POA: Insufficient documentation

## 2017-08-19 DIAGNOSIS — I517 Cardiomegaly: Secondary | ICD-10-CM | POA: Insufficient documentation

## 2017-08-19 DIAGNOSIS — R071 Chest pain on breathing: Secondary | ICD-10-CM | POA: Diagnosis not present

## 2017-08-19 DIAGNOSIS — T82837D Hemorrhage of cardiac prosthetic devices, implants and grafts, subsequent encounter: Secondary | ICD-10-CM | POA: Diagnosis not present

## 2017-08-19 DIAGNOSIS — I481 Persistent atrial fibrillation: Secondary | ICD-10-CM | POA: Diagnosis not present

## 2017-08-19 LAB — POCT I-STAT CREATININE: CREATININE: 1.2 mg/dL — AB (ref 0.44–1.00)

## 2017-08-19 MED ORDER — IOPAMIDOL (ISOVUE-370) INJECTION 76%
60.0000 mL | Freq: Once | INTRAVENOUS | Status: AC | PRN
Start: 2017-08-19 — End: 2017-08-19
  Administered 2017-08-19: 60 mL via INTRAVENOUS

## 2017-08-24 DIAGNOSIS — R300 Dysuria: Secondary | ICD-10-CM | POA: Diagnosis not present

## 2017-08-24 DIAGNOSIS — R531 Weakness: Secondary | ICD-10-CM | POA: Diagnosis not present

## 2017-08-24 DIAGNOSIS — R1084 Generalized abdominal pain: Secondary | ICD-10-CM | POA: Diagnosis not present

## 2017-08-24 DIAGNOSIS — F5104 Psychophysiologic insomnia: Secondary | ICD-10-CM | POA: Diagnosis not present

## 2017-08-25 ENCOUNTER — Emergency Department: Payer: PPO

## 2017-08-25 ENCOUNTER — Emergency Department
Admission: EM | Admit: 2017-08-25 | Discharge: 2017-08-25 | Disposition: A | Discharge status: Home / Self Care | Payer: PPO | Attending: Emergency Medicine | Admitting: Emergency Medicine

## 2017-08-25 ENCOUNTER — Encounter: Payer: Self-pay | Admitting: Emergency Medicine

## 2017-08-25 ENCOUNTER — Encounter: Payer: PPO | Admitting: Student in an Organized Health Care Education/Training Program

## 2017-08-25 ENCOUNTER — Other Ambulatory Visit: Payer: Self-pay

## 2017-08-25 DIAGNOSIS — R079 Chest pain, unspecified: Secondary | ICD-10-CM | POA: Diagnosis not present

## 2017-08-25 DIAGNOSIS — I509 Heart failure, unspecified: Secondary | ICD-10-CM | POA: Insufficient documentation

## 2017-08-25 DIAGNOSIS — R0602 Shortness of breath: Secondary | ICD-10-CM | POA: Diagnosis not present

## 2017-08-25 DIAGNOSIS — E878 Other disorders of electrolyte and fluid balance, not elsewhere classified: Secondary | ICD-10-CM | POA: Diagnosis not present

## 2017-08-25 DIAGNOSIS — I110 Hypertensive heart disease with heart failure: Secondary | ICD-10-CM | POA: Insufficient documentation

## 2017-08-25 DIAGNOSIS — Z7982 Long term (current) use of aspirin: Secondary | ICD-10-CM | POA: Insufficient documentation

## 2017-08-25 DIAGNOSIS — E871 Hypo-osmolality and hyponatremia: Secondary | ICD-10-CM | POA: Diagnosis not present

## 2017-08-25 DIAGNOSIS — Z950 Presence of cardiac pacemaker: Secondary | ICD-10-CM | POA: Insufficient documentation

## 2017-08-25 DIAGNOSIS — E039 Hypothyroidism, unspecified: Secondary | ICD-10-CM | POA: Diagnosis not present

## 2017-08-25 DIAGNOSIS — R531 Weakness: Secondary | ICD-10-CM

## 2017-08-25 DIAGNOSIS — Z79899 Other long term (current) drug therapy: Secondary | ICD-10-CM | POA: Diagnosis not present

## 2017-08-25 DIAGNOSIS — E875 Hyperkalemia: Secondary | ICD-10-CM | POA: Diagnosis not present

## 2017-08-25 LAB — BASIC METABOLIC PANEL
ANION GAP: 7 (ref 5–15)
BUN: 22 mg/dL — AB (ref 6–20)
CO2: 29 mmol/L (ref 22–32)
Calcium: 9.1 mg/dL (ref 8.9–10.3)
Chloride: 91 mmol/L — ABNORMAL LOW (ref 101–111)
Creatinine, Ser: 1.04 mg/dL — ABNORMAL HIGH (ref 0.44–1.00)
GFR calc Af Amer: 55 mL/min — ABNORMAL LOW (ref >60)
GFR, EST NON AFRICAN AMERICAN: 47 mL/min — AB (ref >60)
Glucose, Bld: 115 mg/dL — ABNORMAL HIGH (ref 65–99)
POTASSIUM: 4 mmol/L (ref 3.5–5.1)
SODIUM: 127 mmol/L — AB (ref 135–145)

## 2017-08-25 LAB — CBC
HEMATOCRIT: 31.7 % — AB (ref 35.0–47.0)
Hemoglobin: 10.5 g/dL — ABNORMAL LOW (ref 12.0–16.0)
MCH: 32.4 pg (ref 26.0–34.0)
MCHC: 33 g/dL (ref 32.0–36.0)
MCV: 98.2 fL (ref 80.0–100.0)
Platelets: 129 10*3/uL — ABNORMAL LOW (ref 150–440)
RBC: 3.23 MIL/uL — ABNORMAL LOW (ref 3.80–5.20)
RDW: 16.3 % — AB (ref 11.5–14.5)
WBC: 4.3 10*3/uL (ref 3.6–11.0)

## 2017-08-25 LAB — TROPONIN I
TROPONIN I: 0.03 ng/mL — AB (ref <0.03)
TROPONIN I: 0.04 ng/mL — AB (ref <0.03)

## 2017-08-25 MED ORDER — SODIUM CHLORIDE 0.9 % IV BOLUS
1000.0000 mL | Freq: Once | INTRAVENOUS | Status: AC
  Administered 2017-08-25: 1000 mL via INTRAVENOUS

## 2017-08-25 NOTE — ED Notes (Signed)
ED Provider at bedside. 

## 2017-08-25 NOTE — ED Notes (Signed)
Joanna Roberts who brought patient in. She will go take care of patient's cat and will call us or return later. M. [phone]

## 2017-08-25 NOTE — ED Notes (Signed)
Pt has been following up with primary care physician. She has felt week and tired and also has a UTI. She has not taken that medication or her sleep medication

## 2017-08-25 NOTE — Discharge Instructions (Addendum)
Please seek medical attention for any high fevers, chest pain, shortness of breath, change in behavior, persistent vomiting, bloody stool or any other new or concerning symptoms.  

## 2017-08-25 NOTE — ED Provider Notes (Signed)
[facility] Emergency Department Provider Note   ____________________________________________   I have reviewed the triage vital signs and the nursing notes.   HISTORY  Chief Complaint Shortness of Breath   History limited by: Not Limited   HPI Joanna Roberts is a 84 y.o. female who presents to the emergency department today because of concern for continued shortness of breath. The patient has been having shortness of breath for a few weeks now. She thinks that it got worse after she had her pacemaker exchanged. She has been working with her primary care provider to identify the etiology of the shortness of breath. Underwent CT angio roughly one week ago. Had office appointment and had testing done yesterday concerning for hyponatremia and UTI. Provider prescribed antibiotic which patient has yet to start. Patient also has complaint of poor sleep, had discussed this with provider yesterday and was prescribed sleep aid which she has yet to start.   Per medical record review patient has a history of visit to PCP yesterday.  Past Medical History:  Diagnosis Date  . AICD (automatic cardioverter/defibrillator) present   . Allergy   . Anemia   . Arthritis   . Cataract   . CHF (congestive heart failure) (HCC)   . Depression   . Dysrhythmia   . GERD (gastroesophageal reflux disease)   . Heart murmur   . History of TIAs   . Hypertension   . Hypothyroidism   . Myocardial infarction (HCC)   . Osteoporosis   . Presence of permanent cardiac pacemaker   . Thyroid disease     Patient Active Problem List   Diagnosis Date Noted  . Ankle ulcer (HCC) 01/31/2017  . Carotid stenosis 01/31/2017  . Atherosclerosis of native arteries of the extremities with ulceration (HCC) 01/02/2017  . Varicose veins of both lower extremities 09/30/2016  . Lymphedema 07/29/2016  . Protein-calorie malnutrition, severe 06/03/2016  . Sepsis (HCC) 06/01/2016  . Acute blood loss anemia  04/01/2016  . Hematoma of leg, right, initial encounter 03/23/2016  . Chronic mesenteric ischemia (HCC) 03/15/2016  . Chronic venous insufficiency 02/12/2016  . DDD (degenerative disc disease), lumbar 11/18/2015  . Facet syndrome, lumbar 11/18/2015  . Sacroiliac joint dysfunction 11/18/2015  . Spinal stenosis, lumbar region, with neurogenic claudication 11/18/2015  . Lumbar radiculopathy 11/18/2015  . Compression fracture of thoracic spine, non-traumatic (HCC) 11/18/2015  . Idiopathic scoliosis 11/18/2015  . H/O varicose vein stripping 11/18/2015  . Acquired hypothyroidism 09/17/2015  . Atrial fibrillation (HCC) 09/17/2015  . Cardiomyopathy (HCC) 09/17/2015  . Back pain, chronic 09/17/2015  . DD (diverticular disease) 09/17/2015  . Aggrieved 09/17/2015  . 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 History of colon polyps 09/17/2015  . Personal history of urinary infection 09/17/2015  . Cannot sleep 09/17/2015  . Esophagitis, reflux 09/17/2015  . Tachycardia-bradycardia (HCC) 09/17/2015  . A-fib (HCC) 03/18/2015  . Pneumonia 03/11/2015  . B12 deficiency 11/27/2014  . Chronic anemia 11/26/2014  . Incomplete bladder emptying 06/24/2014  . Frequent UTI 06/24/2014  . History of urinary anomaly 06/21/2014  . FOM (frequency of micturition) 06/21/2014  . Chronic abdominal pain 01/17/2014  . CN (constipation) 01/17/2014  . Fatty infiltration of liver 01/17/2014  . Pain in rib 11/06/2013  . Temporary cerebral vascular dysfunction 11/06/2013    Past Surgical History:  Procedure Laterality Date  . ABDOMINAL HYSTERECTOMY    . APPENDECTOMY    . HERNIA REPAIR    . IMPLANTABLE CARDIOVERTER DEFIBRILLATOR (ICD) GENERATOR CHANGE Left 07/27/2017  Procedure: ICD GENERATOR CHANGE;  Surgeon: Marcina Millard, MD;  Location: [facility];  Service: Cardiovascular;  Laterality: Left;  . INSERT / REPLACE / REMOVE PACEMAKER    . LOWER EXTREMITY ANGIOGRAPHY Left 02/08/2017   Procedure: Lower Extremity  Angiography;  Surgeon: Renford Dills, MD;  Location: [facility];  Service: Cardiovascular;  Laterality: Left;  . PERIPHERAL VASCULAR CATHETERIZATION Right 10/08/2014   Procedure: Lower Extremity Angiography;  Surgeon: Renford Dills, MD;  Location: [facility];  Service: Cardiovascular;  Laterality: Right;  . PERIPHERAL VASCULAR CATHETERIZATION  10/08/2014   Procedure: Lower Extremity Intervention;  Surgeon: Renford Dills, MD;  Location: [facility];  Service: Cardiovascular;;  . PERIPHERAL VASCULAR CATHETERIZATION N/A 03/23/2016   Procedure: Visceral Angiography;  Surgeon: Renford Dills, MD;  Location: [facility];  Service: Cardiovascular;  Laterality: N/A;  . pessary      Prior to Admission medications   Medication Sig Start Date End Date Taking? Authorizing Provider  acetaminophen (TYLENOL) 500 MG tablet Take 1,000 mg by mouth every 8 (eight) hours as needed for mild pain or headache.    [provider]  aspirin EC 81 MG tablet Take 1 tablet (81 mg total) by mouth daily. 02/08/17   Schnier, Latina Craver, MD  carvedilol (COREG) 25 MG tablet TAKE ONE TABLET BY MOUTH TWICE DAILY 10/04/16   [provider]  cephALEXin (KEFLEX) 250 MG capsule Take 1 capsule (250 mg total) by mouth 4 (four) times daily. 07/27/17   Paraschos, Alexander, MD  furosemide (LASIX) 20 MG tablet Take 1 tablet (20 mg total) by mouth daily as needed for fluid. Patient taking differently: Take 20 mg by mouth daily as needed for fluid (for legs swelling).  08/29/16   Adrian Saran, MD  gabapentin (NEURONTIN) 300 MG capsule Take 1 capsule (300 mg total) at bedtime by mouth. Patient taking differently: Take 300 mg by mouth 2 (two) times daily.  03/03/17   Edward Jolly, MD  hydrochlorothiazide (HYDRODIURIL) 25 MG tablet Take 25 mg by mouth daily.  07/01/17   [provider]  levothyroxine (SYNTHROID, LEVOTHROID) 25 MCG tablet TAKE 1 TABLET EVERY DAY ON EMPTY  STOMACHWITH A GLASS OF WATER AT LEAST 30-60 MINBEFORE BREAKFAST 11/29/16   [provider]  losartan (COZAAR) 25 MG tablet TAKE ONE TABLET EVERY DAY IN THE MORNING 07/30/15   [provider]  nitroGLYCERIN (NITROSTAT) 0.4 MG SL tablet Place 0.4 mg under the tongue every 5 (five) minutes as needed for chest pain.  09/22/16   [provider]  oxyCODONE-acetaminophen (PERCOCET/ROXICET) 5-325 MG tablet Take 1 tablet by mouth 2 (two) times daily as needed for severe pain. For chronic pain Fill on or after 05/03/2017, 06/02/2017 05/03/17   Edward Jolly, MD  silver sulfADIAZINE (SILVADENE) 1 % cream Apply 1 application topically 2 (two) times daily. Apply to area twice a day Patient taking differently: Apply 1 application topically 2 (two) times daily as needed (for ulcers on ankles.).  04/29/17   Stegmayer, Cala Bradford A, PA-C  triamcinolone cream (KENALOG) 0.1 % Apply 1 application topically 2 (two) times daily as needed (for rash/skin irritation.).  04/28/17 04/28/18  [provider]  vitamin B-12 (CYANOCOBALAMIN) 1000 MCG tablet Take 1,000 mcg by mouth daily.    [provider]    Allergies Lortab [hydrocodone-acetaminophen]; Doxycycline; Macrobid [nitrofurantoin monohyd macro]; and Nitrofurantoin  Family History  Problem Relation Age of Onset  . Early death Mother   . Heart disease  Mother   . Arthritis Father   . Asthma Father   . Stroke Sister     Social History Social History   Tobacco Use  . Smoking status: Never Smoker  . Smokeless tobacco: Never Used  Substance Use Topics  . Alcohol use: No  . Drug use: No    Review of Systems Constitutional: No fever/chills. Positive for generalized weakness. Eyes: No visual changes. ENT: No sore throat. Cardiovascular: Denies chest pain. Respiratory: Positive for shortness of breath. Gastrointestinal: No abdominal pain.  No nausea, no vomiting.  No diarrhea.  Genitourinary: Negative for  dysuria. Musculoskeletal: Negative for back pain. Skin: Negative for rash. Neurological: Negative for headaches, focal weakness or numbness.  ____________________________________________   PHYSICAL EXAM:  VITAL SIGNS: ED Triage Vitals  Enc Vitals Group     BP 08/25/17 1129 93/76     Pulse Rate 08/25/17 1129 76     Resp 08/25/17 1129 18     Temp 08/25/17 1129 (!) 97.5 F (36.4 C)     Temp Source 08/25/17 1129 Oral     SpO2 08/25/17 1458 100 %     Weight 08/25/17 1131 131 lb (59.4 kg)     Height 08/25/17 1131 5\' 4"  (1.626 m)     Head Circumference --      Peak Flow --      Pain Score 08/25/17 1131 0   Constitutional: Alert and oriented. Well appearing and in no distress. Eyes: Conjunctivae are normal.  ENT   Head: Normocephalic and atraumatic.   Nose: No congestion/rhinnorhea.   Mouth/Throat: Mucous membranes are moist.   Neck: No stridor. Hematological/Lymphatic/Immunilogical: No cervical lymphadenopathy. Cardiovascular: Normal rate, regular rhythm.  No murmurs, rubs, or gallops. Respiratory: Normal respiratory effort without tachypnea nor retractions. Breath sounds are clear and equal bilaterally. No wheezes/rales/rhonchi. Gastrointestinal: Soft and non tender. No rebound. No guarding.  Genitourinary: Deferred Musculoskeletal: Normal range of motion in all extremities. No lower extremity edema. Neurologic:  Normal speech and language. No gross focal neurologic deficits are appreciated.  Skin:  Skin is warm, dry and intact. No rash noted. Psychiatric: Mood and affect are normal. Speech and behavior are normal. Patient exhibits appropriate insight and judgment.  ____________________________________________    LABS (pertinent positives/negatives)  Trop 0.03 CBC wbc 4.3, hgb 10.5, plt 129 BMP na 127, chl 91, glu 115, cr 1.04  ____________________________________________   EKG  I, Phineas Semen, attending physician, personally viewed and interpreted  this EKG  EKG Time: 1143 Rate: 101 Rhythm: ventricular paced rhythm Axis: left axis deviation Intervals: qtc 508 QRS: wide ST changes: no st elevation equivalent Impression: abnormal ekg   ____________________________________________    RADIOLOGY  CXR Soft tissue swelling around pacemaker  ____________________________________________   PROCEDURES  Procedures  ____________________________________________   INITIAL IMPRESSION / ASSESSMENT AND PLAN / ED COURSE  Pertinent labs & imaging results that were available during my care of the patient were reviewed by me and considered in my medical decision making (see chart for details).  patient presented to the emergency department today because of continued shortness of breath weakness and difficulty sleeping.she has been following up with her primary care doctor for all these issues. Blood work today does show hyponatremia and hypochloremia. Patient was given IV fluids. Discussed importance of continued primary care follow-up. _________________________________________   FINAL CLINICAL IMPRESSION(S) / ED DIAGNOSES  Final diagnoses:  Weakness  Hyponatremia  Hypochloremia     Note: This dictation was prepared with Dragon dictation. Any transcriptional errors that result  from this process are unintentional     Phineas Semen, MD 08/25/17 1746

## 2017-08-25 NOTE — ED Triage Notes (Signed)
Was called by [facility] to come to ED to be admitted.  She had her pacemaker  Replaced 4 week ago and since then she has had the shortness of breath.  Was called today to come to the ED.  She is unsure why.  She was diagnosed with uti yesterday , but she had not picked up her medicine.  Also diagnosed with bronchitis.

## 2017-08-25 NOTE — ED Notes (Signed)
IV fluids given from 1600 to 1700

## 2017-08-31 DIAGNOSIS — J449 Chronic obstructive pulmonary disease, unspecified: Secondary | ICD-10-CM | POA: Diagnosis not present

## 2017-08-31 DIAGNOSIS — R609 Edema, unspecified: Secondary | ICD-10-CM | POA: Diagnosis not present

## 2017-08-31 DIAGNOSIS — R0602 Shortness of breath: Secondary | ICD-10-CM | POA: Diagnosis not present

## 2017-09-05 DIAGNOSIS — I429 Cardiomyopathy, unspecified: Secondary | ICD-10-CM | POA: Diagnosis not present

## 2017-09-05 DIAGNOSIS — R1084 Generalized abdominal pain: Secondary | ICD-10-CM | POA: Diagnosis not present

## 2017-09-05 DIAGNOSIS — R531 Weakness: Secondary | ICD-10-CM | POA: Diagnosis not present

## 2017-09-05 DIAGNOSIS — R110 Nausea: Secondary | ICD-10-CM | POA: Diagnosis not present

## 2017-09-05 DIAGNOSIS — F5104 Psychophysiologic insomnia: Secondary | ICD-10-CM | POA: Diagnosis not present

## 2017-09-07 DIAGNOSIS — I509 Heart failure, unspecified: Secondary | ICD-10-CM | POA: Diagnosis not present

## 2017-09-07 DIAGNOSIS — J811 Chronic pulmonary edema: Secondary | ICD-10-CM | POA: Diagnosis not present

## 2017-09-07 DIAGNOSIS — J449 Chronic obstructive pulmonary disease, unspecified: Secondary | ICD-10-CM | POA: Diagnosis not present

## 2017-09-08 DIAGNOSIS — I429 Cardiomyopathy, unspecified: Secondary | ICD-10-CM | POA: Diagnosis not present

## 2017-09-08 DIAGNOSIS — I739 Peripheral vascular disease, unspecified: Secondary | ICD-10-CM | POA: Diagnosis not present

## 2017-09-08 DIAGNOSIS — N390 Urinary tract infection, site not specified: Secondary | ICD-10-CM | POA: Diagnosis not present

## 2017-09-08 DIAGNOSIS — T82837D Hemorrhage of cardiac prosthetic devices, implants and grafts, subsequent encounter: Secondary | ICD-10-CM | POA: Diagnosis not present

## 2017-09-08 DIAGNOSIS — I481 Persistent atrial fibrillation: Secondary | ICD-10-CM | POA: Diagnosis not present

## 2017-09-08 DIAGNOSIS — D649 Anemia, unspecified: Secondary | ICD-10-CM | POA: Diagnosis not present

## 2017-09-12 DIAGNOSIS — N898 Other specified noninflammatory disorders of vagina: Secondary | ICD-10-CM | POA: Diagnosis not present

## 2017-09-12 DIAGNOSIS — Z4689 Encounter for fitting and adjustment of other specified devices: Secondary | ICD-10-CM | POA: Diagnosis not present

## 2017-09-13 DIAGNOSIS — I10 Essential (primary) hypertension: Secondary | ICD-10-CM | POA: Diagnosis not present

## 2017-09-13 DIAGNOSIS — I872 Venous insufficiency (chronic) (peripheral): Secondary | ICD-10-CM | POA: Diagnosis not present

## 2017-09-13 DIAGNOSIS — I495 Sick sinus syndrome: Secondary | ICD-10-CM | POA: Diagnosis not present

## 2017-09-13 DIAGNOSIS — I429 Cardiomyopathy, unspecified: Secondary | ICD-10-CM | POA: Diagnosis not present

## 2017-09-20 ENCOUNTER — Encounter (INDEPENDENT_AMBULATORY_CARE_PROVIDER_SITE_OTHER): Payer: Self-pay | Admitting: Vascular Surgery

## 2017-09-20 ENCOUNTER — Ambulatory Visit (INDEPENDENT_AMBULATORY_CARE_PROVIDER_SITE_OTHER): Payer: PPO

## 2017-09-20 ENCOUNTER — Ambulatory Visit (INDEPENDENT_AMBULATORY_CARE_PROVIDER_SITE_OTHER): Payer: PPO | Admitting: Vascular Surgery

## 2017-09-20 VITALS — BP 121/77 | HR 73 | Resp 13 | Ht 61.0 in | Wt 140.0 lb

## 2017-09-20 DIAGNOSIS — I7025 Atherosclerosis of native arteries of other extremities with ulceration: Secondary | ICD-10-CM

## 2017-09-20 DIAGNOSIS — I890 Lymphedema, not elsewhere classified: Secondary | ICD-10-CM | POA: Diagnosis not present

## 2017-09-20 DIAGNOSIS — I6523 Occlusion and stenosis of bilateral carotid arteries: Secondary | ICD-10-CM

## 2017-09-20 DIAGNOSIS — I739 Peripheral vascular disease, unspecified: Secondary | ICD-10-CM

## 2017-09-20 NOTE — Progress Notes (Signed)
Subjective:    Patient ID: Joanna Roberts, female    DOB: 1929/02/12, 84 y.o.   MRN: [id number]  Patient presents for a six-month peripheral artery disease follow-up.  The patient presents today with a chief complaint of bilateral lower extremity swelling.  The patient denies any claudication-like symptoms, rest pain or ulceration to her toes.  The patient notes progressively worsening edema to the legs.  The patient is now weeping a clear liquid.  The patient states that she wears a compression socks on a daily basis and tries to elevate her legs.  The patient is still refusing a lymphedema pump but she states she is unable to place the pumps on independently.  The patient lives in an assisted living type of residence.  The patient underwent a bilateral lower extremity arterial duplex which was notable for a right ABI of 0.58 in the left ABI of 0.92.  Multiple areas of atherosclerotic disease of the bilateral lower extremity with limited visualization.  Moderate interstitial edema extending to the upper thigh of the bilateral lower extremities.  When compared to the previous ABI on September 30, 2016 there has been a slight decrease in the right ABI ratio.  The patient denies any erythema to the bilateral lower extremity.  Patient denies any fever, nausea vomiting.  Review of Systems  Constitutional: Negative.   HENT: Negative.   Eyes: Negative.   Respiratory: Negative.   Cardiovascular: Positive for leg swelling.       PAD  Gastrointestinal: Negative.   Endocrine: Negative.   Genitourinary: Negative.   Musculoskeletal: Negative.   Skin: Negative.   Allergic/Immunologic: Negative.   Neurological: Negative.   Hematological: Negative.   Psychiatric/Behavioral: Negative.       Objective:   Physical Exam  Constitutional: She is oriented to person, place, and time. She appears well-developed and well-nourished. No distress.  HENT:  Head: Normocephalic and atraumatic.  Right Ear: External ear  normal.  Left Ear: External ear normal.  Eyes: Pupils are equal, round, and reactive to light. Conjunctivae and EOM are normal.  Neck: Normal range of motion.  Cardiovascular: Normal rate, regular rhythm, normal heart sounds and intact distal pulses.  Pulses:      Radial pulses are 2+ on the right side, and 2+ on the left side.  Hard to palpate pedal pulses due to edema  Pulmonary/Chest: Effort normal and breath sounds normal.  Musculoskeletal: Normal range of motion. She exhibits edema (Moderate nonpitting edema noted bilaterally, right greater than left.  2 areas of weeping to the bilateral lower extremity.  Fluid is clear.).  Neurological: She is alert and oriented to person, place, and time.  Skin: She is not diaphoretic.  Is no active cellulitis noted to the lower extremity.  Psychiatric: She has a normal mood and affect. Her behavior is normal. Judgment and thought content normal.  Vitals reviewed.  BP 121/77 (BP Location: Right Arm, Patient Position: Sitting)   Pulse 73   Resp 13   Ht 5\' 1"  (1.549 m)   Wt 140 lb (63.5 kg)   BMI 26.45 kg/m   Past Medical History:  Diagnosis Date  . AICD (automatic cardioverter/defibrillator) present   . Allergy   . Anemia   . Arthritis   . Cataract   . CHF (congestive heart failure) (HCC)   . Depression   . Dysrhythmia   . GERD (gastroesophageal reflux disease)   . Heart murmur   . History of TIAs   . Hypertension   .  Hypothyroidism   . Myocardial infarction (HCC)   . Osteoporosis   . Presence of permanent cardiac pacemaker   . Thyroid disease    Social History   Socioeconomic History  . Marital status: Widowed    Spouse name: Not on file  . Number of children: Not on file  . Years of education: Not on file  . Highest education level: Not on file  Occupational History  . Not on file  Social Needs  . Financial resource strain: Not on file  . Food insecurity:    Worry: Not on file    Inability: Not on file  .  Transportation needs:    Medical: Not on file    Non-medical: Not on file  Tobacco Use  . Smoking status: Never Smoker  . Smokeless tobacco: Never Used  Substance and Sexual Activity  . Alcohol use: No  . Drug use: No  . Sexual activity: Not Currently    Birth control/protection: Post-menopausal  Lifestyle  . Physical activity:    Days per week: Not on file    Minutes per session: Not on file  . Stress: Not on file  Relationships  . Social connections:    Talks on phone: Not on file    Gets together: Not on file    Attends religious service: Not on file    Active member of club or organization: Not on file    Attends meetings of clubs or organizations: Not on file    Relationship status: Not on file  . Intimate partner violence:    Fear of current or ex partner: Not on file    Emotionally abused: Not on file    Physically abused: Not on file    Forced sexual activity: Not on file  Other Topics Concern  . Not on file  Social History Narrative  . Not on file   Past Surgical History:  Procedure Laterality Date  . ABDOMINAL HYSTERECTOMY    . APPENDECTOMY    . HERNIA REPAIR    . IMPLANTABLE CARDIOVERTER DEFIBRILLATOR (ICD) GENERATOR CHANGE Left 07/27/2017   Procedure: ICD GENERATOR CHANGE;  Surgeon: Marcina Millard, MD;  Location: [facility] ORS;  Service: Cardiovascular;  Laterality: Left;  . INSERT / REPLACE / REMOVE PACEMAKER    . LOWER EXTREMITY ANGIOGRAPHY Left 02/08/2017   Procedure: Lower Extremity Angiography;  Surgeon: Renford Dills, MD;  Location: [facility] INVASIVE CV LAB;  Service: Cardiovascular;  Laterality: Left;  . PERIPHERAL VASCULAR CATHETERIZATION Right 10/08/2014   Procedure: Lower Extremity Angiography;  Surgeon: Renford Dills, MD;  Location: [facility] INVASIVE CV [facility];  Service: Cardiovascular;  Laterality: Right;  . PERIPHERAL VASCULAR CATHETERIZATION  10/08/2014   Procedure: Lower Extremity Intervention;  Surgeon: Renford Dills, MD;  Location: [facility]  INVASIVE CV LAB;  Service: Cardiovascular;;  . PERIPHERAL VASCULAR CATHETERIZATION N/A 03/23/2016   Procedure: Visceral Angiography;  Surgeon: Renford Dills, MD;  Location: [facility] INVASIVE CV [facility];  Service: Cardiovascular;  Laterality: N/A;  . pessary     Family History  Problem Relation Age of Onset  . Early death Mother   . Heart disease Mother   . Arthritis Father   . Asthma Father   . Stroke Sister    Allergies  Allergen Reactions  . Lortab [Hydrocodone-Acetaminophen] Itching, Swelling and Rash  . Doxycycline Diarrhea  . Macrobid [Nitrofurantoin Monohyd Macro] Diarrhea and Nausea And Vomiting  . Nitrofurantoin Diarrhea, Nausea Only and Nausea And Vomiting      Assessment & Plan:  Patient presents for a six-month peripheral artery disease follow-up.  The patient presents today with a chief complaint of bilateral lower extremity swelling.  The patient denies any claudication-like symptoms, rest pain or ulceration to her toes.  The patient notes progressively worsening edema to the legs.  The patient is now weeping a clear liquid.  The patient states that she wears a compression socks on a daily basis and tries to elevate her legs.  The patient is still refusing a lymphedema pump but she states she is unable to place the pumps on independently.  The patient lives in an assisted living type of residence.  The patient underwent a bilateral lower extremity arterial duplex which was notable for a right ABI of 0.58 in the left ABI of 0.92.  Multiple areas of atherosclerotic disease of the bilateral lower extremity with limited visualization.  Moderate interstitial edema extending to the upper thigh of the bilateral lower extremities.  When compared to the previous ABI on September 30, 2016 there has been a slight decrease in the right ABI ratio.  The patient denies any erythema to the bilateral lower extremity.  Patient denies any fever, nausea vomiting.  1. Lymphedema - Stable Patient presents with  moderate to severe bilateral lower extremity edema with weeping. There is no active cellulitis however she is weeping a clear fluid Recommend 3 layer zinc oxide bilateral Unna wraps to the lower extremity changed weekly for approximately 1 month Patient is to elevate her legs heart level or higher as much as possible Patient follow-up in 1 month to assess her progress with Unna boot therapy  2. PAD (peripheral artery disease) (HCC) - Stable There is been a slight decrease to the right ABI when compared to duplexes conducted 6 months ago At this time, the patient is not complaining of any symptoms that I think it is more important to treat her edema as it is quite severe I have discussed with the patient at length the risk factors for and pathogenesis of atherosclerotic disease and encouraged a healthy diet, regular exercise regimen and blood pressure / glucose control.  The patient was encouraged to call the office in the interim if he experiences any claudication like symptoms, rest pain or ulcers to his feet / toes.  - VAS [place] ABI WITH/WO TBI; Future - VAS [place] LOWER EXTREMITY ARTERIAL DUPLEX; Future  Current Outpatient Medications on File Prior to Visit  Medication Sig Dispense Refill  . acetaminophen (TYLENOL) 500 MG tablet Take 1,000 mg by mouth every 8 (eight) hours as needed for mild pain or headache.    . furosemide (LASIX) 20 MG tablet Take 1 tablet (20 mg total) by mouth daily as needed for fluid. (Patient taking differently: Take 20 mg by mouth daily as needed for fluid (for legs swelling). )    . gabapentin (NEURONTIN) 300 MG capsule Take 1 capsule (300 mg total) at bedtime by mouth. (Patient taking differently: Take 300 mg by mouth 2 (two) times daily. ) 30 capsule 3  . hydrochlorothiazide (HYDRODIURIL) 25 MG tablet Take 25 mg by mouth daily.     Marland Kitchen levalbuterol (XOPENEX HFA) 45 MCG/ACT inhaler Inhale into the lungs.    Marland Kitchen levothyroxine (SYNTHROID, LEVOTHROID) 25 MCG tablet TAKE 1  TABLET EVERY DAY ON EMPTY STOMACHWITH A GLASS OF WATER AT LEAST 30-60 MINBEFORE BREAKFAST    . losartan (COZAAR) 25 MG tablet TAKE ONE TABLET EVERY DAY IN THE MORNING    . nitroGLYCERIN (NITROSTAT) 0.4 MG SL tablet Place  0.4 mg under the tongue every 5 (five) minutes as needed for chest pain.     Marland Kitchen omeprazole (PRILOSEC) 20 MG capsule     . ondansetron (ZOFRAN-ODT) 4 MG disintegrating tablet     . oxyCODONE-acetaminophen (PERCOCET/ROXICET) 5-325 MG tablet Take 1 tablet by mouth 2 (two) times daily as needed for severe pain. For chronic pain Fill on or after 05/03/2017, 06/02/2017 60 tablet 0  . ranitidine (ZANTAC) 150 MG capsule     . silver sulfADIAZINE (SILVADENE) 1 % cream Apply 1 application topically 2 (two) times daily. Apply to area twice a day (Patient taking differently: Apply 1 application topically 2 (two) times daily as needed (for ulcers on ankles.). ) 25 g 2  . temazepam (RESTORIL) 7.5 MG capsule Take by mouth.    . triamcinolone cream (KENALOG) 0.1 % Apply 1 application topically 2 (two) times daily as needed (for rash/skin irritation.).     Marland Kitchen vitamin B-12 (CYANOCOBALAMIN) 1000 MCG tablet Take 1,000 mcg by mouth daily.    Marland Kitchen aspirin EC 81 MG tablet Take 1 tablet (81 mg total) by mouth daily. (Patient not taking: Reported on 09/20/2017) 150 tablet 2  . carvedilol (COREG) 25 MG tablet TAKE ONE TABLET BY MOUTH TWICE DAILY    . cephALEXin (KEFLEX) 250 MG capsule Take 1 capsule (250 mg total) by mouth 4 (four) times daily. (Patient not taking: Reported on 09/20/2017) 28 capsule 0   No current facility-administered medications on file prior to visit.    There are no Patient Instructions on file for this visit. No follow-ups on file.  Kelvin Sennett A Klaire Court, PA-C

## 2017-09-21 ENCOUNTER — Telehealth (INDEPENDENT_AMBULATORY_CARE_PROVIDER_SITE_OTHER): Payer: Self-pay | Admitting: Vascular Surgery

## 2017-09-21 NOTE — Telephone Encounter (Signed)
I spoke with the patient and she said she will try to work with her unna boot to make it feel more comfortable and said she will call the office if continues to be unbearable.I did advise the patient to elevated her legs above heart level to help with the swelling.

## 2017-09-21 NOTE — Telephone Encounter (Signed)
PT STATES HER LEFT UNNA BOOT IS UNBEARABLE. STATES SHE HAS TRIED TO LOOSEN IT BUT IT IS NOT WORKING.

## 2017-09-23 ENCOUNTER — Other Ambulatory Visit: Payer: Self-pay

## 2017-09-23 ENCOUNTER — Encounter: Payer: Self-pay | Admitting: Emergency Medicine

## 2017-09-23 ENCOUNTER — Other Ambulatory Visit: Payer: Self-pay | Admitting: *Deleted

## 2017-09-23 ENCOUNTER — Telehealth (INDEPENDENT_AMBULATORY_CARE_PROVIDER_SITE_OTHER): Payer: Self-pay

## 2017-09-23 ENCOUNTER — Inpatient Hospital Stay
Admission: EM | Admit: 2017-09-23 | Discharge: 2017-09-26 | DRG: 291 | Disposition: A | Discharge status: Home Health | Payer: PPO | Attending: Internal Medicine | Admitting: Internal Medicine

## 2017-09-23 ENCOUNTER — Emergency Department: Payer: PPO

## 2017-09-23 DIAGNOSIS — R609 Edema, unspecified: Secondary | ICD-10-CM | POA: Diagnosis present

## 2017-09-23 DIAGNOSIS — I420 Dilated cardiomyopathy: Secondary | ICD-10-CM | POA: Diagnosis present

## 2017-09-23 DIAGNOSIS — Z7982 Long term (current) use of aspirin: Secondary | ICD-10-CM | POA: Diagnosis not present

## 2017-09-23 DIAGNOSIS — Z9114 Patient's other noncompliance with medication regimen: Secondary | ICD-10-CM | POA: Diagnosis not present

## 2017-09-23 DIAGNOSIS — K219 Gastro-esophageal reflux disease without esophagitis: Secondary | ICD-10-CM | POA: Diagnosis present

## 2017-09-23 DIAGNOSIS — I252 Old myocardial infarction: Secondary | ICD-10-CM

## 2017-09-23 DIAGNOSIS — Z79899 Other long term (current) drug therapy: Secondary | ICD-10-CM

## 2017-09-23 DIAGNOSIS — I482 Chronic atrial fibrillation, unspecified: Secondary | ICD-10-CM | POA: Diagnosis present

## 2017-09-23 DIAGNOSIS — I340 Nonrheumatic mitral (valve) insufficiency: Secondary | ICD-10-CM | POA: Diagnosis not present

## 2017-09-23 DIAGNOSIS — Z66 Do not resuscitate: Secondary | ICD-10-CM | POA: Diagnosis present

## 2017-09-23 DIAGNOSIS — E039 Hypothyroidism, unspecified: Secondary | ICD-10-CM | POA: Diagnosis present

## 2017-09-23 DIAGNOSIS — D631 Anemia in chronic kidney disease: Secondary | ICD-10-CM | POA: Diagnosis present

## 2017-09-23 DIAGNOSIS — J9621 Acute and chronic respiratory failure with hypoxia: Secondary | ICD-10-CM | POA: Diagnosis present

## 2017-09-23 DIAGNOSIS — Z95810 Presence of automatic (implantable) cardiac defibrillator: Secondary | ICD-10-CM | POA: Diagnosis not present

## 2017-09-23 DIAGNOSIS — J9601 Acute respiratory failure with hypoxia: Secondary | ICD-10-CM

## 2017-09-23 DIAGNOSIS — I130 Hypertensive heart and chronic kidney disease with heart failure and stage 1 through stage 4 chronic kidney disease, or unspecified chronic kidney disease: Secondary | ICD-10-CM | POA: Diagnosis not present

## 2017-09-23 DIAGNOSIS — M810 Age-related osteoporosis without current pathological fracture: Secondary | ICD-10-CM | POA: Diagnosis not present

## 2017-09-23 DIAGNOSIS — Z885 Allergy status to narcotic agent status: Secondary | ICD-10-CM

## 2017-09-23 DIAGNOSIS — I5043 Acute on chronic combined systolic (congestive) and diastolic (congestive) heart failure: Secondary | ICD-10-CM | POA: Diagnosis present

## 2017-09-23 DIAGNOSIS — N183 Chronic kidney disease, stage 3 (moderate): Secondary | ICD-10-CM | POA: Diagnosis not present

## 2017-09-23 DIAGNOSIS — I080 Rheumatic disorders of both mitral and aortic valves: Secondary | ICD-10-CM | POA: Diagnosis present

## 2017-09-23 DIAGNOSIS — I509 Heart failure, unspecified: Secondary | ICD-10-CM

## 2017-09-23 DIAGNOSIS — I5023 Acute on chronic systolic (congestive) heart failure: Secondary | ICD-10-CM | POA: Diagnosis not present

## 2017-09-23 DIAGNOSIS — I959 Hypotension, unspecified: Secondary | ICD-10-CM | POA: Diagnosis not present

## 2017-09-23 DIAGNOSIS — R001 Bradycardia, unspecified: Secondary | ICD-10-CM | POA: Diagnosis not present

## 2017-09-23 DIAGNOSIS — M1990 Unspecified osteoarthritis, unspecified site: Secondary | ICD-10-CM | POA: Diagnosis present

## 2017-09-23 DIAGNOSIS — Z881 Allergy status to other antibiotic agents status: Secondary | ICD-10-CM

## 2017-09-23 DIAGNOSIS — I350 Nonrheumatic aortic (valve) stenosis: Secondary | ICD-10-CM | POA: Diagnosis not present

## 2017-09-23 DIAGNOSIS — R0602 Shortness of breath: Secondary | ICD-10-CM | POA: Diagnosis not present

## 2017-09-23 DIAGNOSIS — I10 Essential (primary) hypertension: Secondary | ICD-10-CM | POA: Diagnosis present

## 2017-09-23 DIAGNOSIS — R601 Generalized edema: Secondary | ICD-10-CM | POA: Diagnosis not present

## 2017-09-23 DIAGNOSIS — I110 Hypertensive heart disease with heart failure: Secondary | ICD-10-CM | POA: Diagnosis not present

## 2017-09-23 LAB — BASIC METABOLIC PANEL
Anion gap: 10 (ref 5–15)
BUN: 26 mg/dL — AB (ref 6–20)
CALCIUM: 9 mg/dL (ref 8.9–10.3)
CHLORIDE: 95 mmol/L — AB (ref 101–111)
CO2: 28 mmol/L (ref 22–32)
CREATININE: 1.33 mg/dL — AB (ref 0.44–1.00)
GFR calc Af Amer: 41 mL/min — ABNORMAL LOW (ref >60)
GFR calc non Af Amer: 35 mL/min — ABNORMAL LOW (ref >60)
GLUCOSE: 137 mg/dL — AB (ref 65–99)
Potassium: 4.4 mmol/L (ref 3.5–5.1)
Sodium: 133 mmol/L — ABNORMAL LOW (ref 135–145)

## 2017-09-23 LAB — CBC
HCT: 35.1 % (ref 35.0–47.0)
Hemoglobin: 11.2 g/dL — ABNORMAL LOW (ref 12.0–16.0)
MCH: 31.5 pg (ref 26.0–34.0)
MCHC: 31.9 g/dL — AB (ref 32.0–36.0)
MCV: 98.8 fL (ref 80.0–100.0)
PLATELETS: 137 10*3/uL — AB (ref 150–440)
RBC: 3.55 MIL/uL — ABNORMAL LOW (ref 3.80–5.20)
RDW: 15.9 % — AB (ref 11.5–14.5)
WBC: 3.8 10*3/uL (ref 3.6–11.0)

## 2017-09-23 LAB — TROPONIN I: Troponin I: <0.03 ng/mL (ref <0.03)

## 2017-09-23 LAB — BRAIN NATRIURETIC PEPTIDE: B NATRIURETIC PEPTIDE 5: 1688 pg/mL — AB (ref 0.0–100.0)

## 2017-09-23 MED ORDER — FUROSEMIDE 10 MG/ML IJ SOLN
60.0000 mg | Freq: Once | INTRAMUSCULAR | Status: AC
  Administered 2017-09-23: 60 mg via INTRAVENOUS
  Filled 2017-09-23: qty 8

## 2017-09-23 MED ORDER — ONDANSETRON HCL 4 MG/2ML IJ SOLN
4.0000 mg | Freq: Once | INTRAMUSCULAR | Status: AC
Start: 2017-09-23 — End: 2017-09-23
  Administered 2017-09-23: 4 mg via INTRAVENOUS
  Filled 2017-09-23: qty 2

## 2017-09-23 NOTE — Telephone Encounter (Signed)
The patient needs to elevate her lower extremities as much as possible to help draw the fluid out of her legs. Im concerned about her fluid status.  I really feel the patient should be seen by her cardiologist or her primary care physician as soon as possible to assess her fluid status.  If she is unable to see them she may want to seek medical attention at an urgent care or the emergency department she may need to have some fluid diuresed.

## 2017-09-23 NOTE — ED Notes (Signed)
RNx2 turned pt and applied fresh linens to pt. VSS. Pt updated on plan of care. Pt reports that she is feeling better. Pt legs are less edematous. Call bell within reach, will continue to monitor.

## 2017-09-23 NOTE — Patient Outreach (Signed)
[facility]) Care Management  09/23/2017  FLORNCE RECORD [birth year] [id number]   Telephone Screen  Referral Date: 09/22/17 Referral Source: [facility] Referral from [facility] team at [facility] Referral Reason: Member request nurse  For home services, concerns voiced with transportation to appointments Insurance: [facility]   Outreach attempt # 1 No answer. RN CM left HIPAA compliant voicemail message along with CM's contact info.  Plan: RN CM sent an unsuccessful outreach letter and scheduled this patient for another call attempt within 4 business days  Leshea Jaggers L. Noelle Penner, RN, BSN, CCM [facility] Telephonic Care Management Care Coordinator Direct number [phone]  Main [facility] number [phone] Fax number [phone]

## 2017-09-23 NOTE — ED Triage Notes (Signed)
Sent for CHF exacerbation.  Gained 25 lbs over last month or 2 and they have been unable to successfully diurese.  +orthopnea/SHOB. Unlabored currently. VSS.

## 2017-09-23 NOTE — ED Notes (Signed)
April RN, aware of bed assigned   

## 2017-09-23 NOTE — ED Provider Notes (Signed)
[facility] Emergency Department Provider Note    First MD Initiated Contact with Patient 09/23/17 1955     (approximate)  I have reviewed the triage vital signs and the nursing notes.   HISTORY  Chief Complaint Shortness of Breath    HPI Joanna Roberts is a 84 y.o. female with a history of CHF presents to the ER with over 25 pound weight gain over the past 3 weeks with worsening orthopnea, exertional dyspnea and significant leg swelling.  Patient is prescribed oral Lasix but admits that due to all of her frequent doctor's appointments and the fact that she wears a pessary and as it causes her to urinate frequently that she does not always adhere to those medications.  She denies any pains.    Past Medical History:  Diagnosis Date  . AICD (automatic cardioverter/defibrillator) present   . Allergy   . Anemia   . Arthritis   . Cataract   . CHF (congestive heart failure) (HCC)   . Depression   . Dysrhythmia   . GERD (gastroesophageal reflux disease)   . Heart murmur   . History of TIAs   . Hypertension   . Hypothyroidism   . Myocardial infarction (HCC)   . Osteoporosis   . Presence of permanent cardiac pacemaker   . Thyroid disease    Family History  Problem Relation Age of Onset  . Early death Mother   . Heart disease Mother   . Arthritis Father   . Asthma Father   . Stroke Sister    Past Surgical History:  Procedure Laterality Date  . ABDOMINAL HYSTERECTOMY    . APPENDECTOMY    . HERNIA REPAIR    . IMPLANTABLE CARDIOVERTER DEFIBRILLATOR (ICD) GENERATOR CHANGE Left 07/27/2017   Procedure: ICD GENERATOR CHANGE;  Surgeon: Marcina Millard, MD;  Location: [facility] ORS;  Service: Cardiovascular;  Laterality: Left;  . INSERT / REPLACE / REMOVE PACEMAKER    . LOWER EXTREMITY ANGIOGRAPHY Left 02/08/2017   Procedure: Lower Extremity Angiography;  Surgeon: Renford Dills, MD;  Location: [facility] INVASIVE CV LAB;  Service: Cardiovascular;   Laterality: Left;  . PERIPHERAL VASCULAR CATHETERIZATION Right 10/08/2014   Procedure: Lower Extremity Angiography;  Surgeon: Renford Dills, MD;  Location: [facility] INVASIVE CV LAB;  Service: Cardiovascular;  Laterality: Right;  . PERIPHERAL VASCULAR CATHETERIZATION  10/08/2014   Procedure: Lower Extremity Intervention;  Surgeon: Renford Dills, MD;  Location: [facility] INVASIVE CV LAB;  Service: Cardiovascular;;  . PERIPHERAL VASCULAR CATHETERIZATION N/A 03/23/2016   Procedure: Visceral Angiography;  Surgeon: Renford Dills, MD;  Location: [facility] INVASIVE CV LAB;  Service: Cardiovascular;  Laterality: N/A;  . pessary     Patient Active Problem List   Diagnosis Date Noted  . Acute on chronic combined systolic and diastolic CHF (congestive heart failure) (HCC) 09/23/2017  . HTN (hypertension) 09/23/2017  . Ankle ulcer (HCC) 01/31/2017  . Carotid stenosis 01/31/2017  . Atherosclerosis of native arteries of the extremities with ulceration (HCC) 01/02/2017  . Varicose veins of both lower extremities 09/30/2016  . Lymphedema 07/29/2016  . Protein-calorie malnutrition, severe 06/03/2016  . Sepsis (HCC) 06/01/2016  . Acute blood loss anemia 04/01/2016  . Hematoma of leg, right, initial encounter 03/23/2016  . Chronic mesenteric ischemia (HCC) 03/15/2016  . PAD (peripheral artery disease) (HCC) 02/12/2016  . DDD (degenerative disc disease), lumbar 11/18/2015  . Facet syndrome, lumbar 11/18/2015  . Sacroiliac joint dysfunction 11/18/2015  . Spinal stenosis, lumbar region, with neurogenic claudication  11/18/2015  . Lumbar radiculopathy 11/18/2015  . Compression fracture of thoracic spine, non-traumatic (HCC) 11/18/2015  . Idiopathic scoliosis 11/18/2015  . H/O varicose vein stripping 11/18/2015  . Acquired hypothyroidism 09/17/2015  . Cardiomyopathy (HCC) 09/17/2015  . Back pain, chronic 09/17/2015  . DD (diverticular disease) 09/17/2015  . Aggrieved 09/17/2015  . 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 History of colon polyps 09/17/2015  . Personal history of urinary infection 09/17/2015  . Cannot sleep 09/17/2015  . GERD (gastroesophageal reflux disease) 09/17/2015  . Tachycardia-bradycardia (HCC) 09/17/2015  . Atrial fibrillation, chronic (HCC) 03/18/2015  . Pneumonia 03/11/2015  . B12 deficiency 11/27/2014  . Chronic anemia 11/26/2014  . Incomplete bladder emptying 06/24/2014  . Frequent UTI 06/24/2014  . History of urinary anomaly 06/21/2014  . FOM (frequency of micturition) 06/21/2014  . Chronic abdominal pain 01/17/2014  . CN (constipation) 01/17/2014  . Fatty infiltration of liver 01/17/2014  . Pain in rib 11/06/2013  . Temporary cerebral vascular dysfunction 11/06/2013      Prior to Admission medications   Medication Sig Start Date End Date Taking? Authorizing Provider  acetaminophen (TYLENOL) 500 MG tablet Take 1,000 mg by mouth every 8 (eight) hours as needed for mild pain or headache.    [provider]  aspirin EC 81 MG tablet Take 1 tablet (81 mg total) by mouth daily. Patient not taking: Reported on 09/20/2017 02/08/17   Schnier, Latina Craver, MD  carvedilol (COREG) 25 MG tablet TAKE ONE TABLET BY MOUTH TWICE DAILY 10/04/16   [provider]  cephALEXin (KEFLEX) 250 MG capsule Take 1 capsule (250 mg total) by mouth 4 (four) times daily. Patient not taking: Reported on 09/20/2017 07/27/17   Marcina Millard, MD  furosemide (LASIX) 20 MG tablet Take 1 tablet (20 mg total) by mouth daily as needed for fluid. Patient taking differently: Take 20 mg by mouth daily as needed for fluid (for legs swelling).  08/29/16   Adrian Saran, MD  gabapentin (NEURONTIN) 300 MG capsule Take 1 capsule (300 mg total) at bedtime by mouth. Patient taking differently: Take 300 mg by mouth 2 (two) times daily.  03/03/17   Edward Jolly, MD  hydrochlorothiazide (HYDRODIURIL) 25 MG tablet Take 25 mg by mouth daily.  07/01/17   [provider]  levalbuterol  Pauline Aus HFA) 45 MCG/ACT inhaler Inhale into the lungs. 09/05/17 09/05/18  [provider]  levothyroxine (SYNTHROID, LEVOTHROID) 25 MCG tablet TAKE 1 TABLET EVERY DAY ON EMPTY STOMACHWITH A GLASS OF WATER AT LEAST 30-60 MINBEFORE BREAKFAST 11/29/16   [provider]  losartan (COZAAR) 25 MG tablet TAKE ONE TABLET EVERY DAY IN THE MORNING 07/30/15   [provider]  nitroGLYCERIN (NITROSTAT) 0.4 MG SL tablet Place 0.4 mg under the tongue every 5 (five) minutes as needed for chest pain.  09/22/16   [provider]  omeprazole (PRILOSEC) 20 MG capsule  08/16/17   [provider]  ondansetron (ZOFRAN-ODT) 4 MG disintegrating tablet  09/05/17   [provider]  oxyCODONE-acetaminophen (PERCOCET/ROXICET) 5-325 MG tablet Take 1 tablet by mouth 2 (two) times daily as needed for severe pain. For chronic pain Fill on or after 05/03/2017, 06/02/2017 05/03/17   Edward Jolly, MD  ranitidine (ZANTAC) 150 MG capsule  08/16/17   [provider]  silver sulfADIAZINE (SILVADENE) 1 % cream Apply 1 application topically 2 (two) times daily. Apply to area twice a day Patient taking differently: Apply 1 application topically 2 (two) times daily as needed (for ulcers on ankles.).  04/29/17   Stegmayer, Cala Bradford A, PA-C  temazepam (RESTORIL) 7.5 MG capsule Take by mouth. 08/24/17 09/23/17  [provider]  triamcinolone cream (KENALOG) 0.1 % Apply 1 application topically 2 (two) times daily as needed (for rash/skin irritation.).  04/28/17 04/28/18  [provider]  vitamin B-12 (CYANOCOBALAMIN) 1000 MCG tablet Take 1,000 mcg by mouth daily.    [provider]    Allergies Lortab [hydrocodone-acetaminophen]; Doxycycline; Macrobid [nitrofurantoin monohyd macro]; and Nitrofurantoin    Social History Social History   Tobacco Use  . Smoking status: Never Smoker  . Smokeless tobacco: Never Used  Substance Use Topics  . Alcohol use: No  . Drug use: No      Review of Systems Patient denies headaches, rhinorrhea, blurry vision, numbness, shortness of breath, chest pain, edema, cough, abdominal pain, nausea, vomiting, diarrhea, dysuria, fevers, rashes or hallucinations unless otherwise stated above in HPI. ____________________________________________   PHYSICAL EXAM:  VITAL SIGNS: Vitals:   09/23/17 2301 09/23/17 2315  BP:    Pulse: 67 72  Resp: 20 17  Temp:    SpO2: 100% 100%    Constitutional: Alert and oriented.  Eyes: Conjunctivae are normal.  Head: Atraumatic. Nose: No congestion/rhinnorhea. Mouth/Throat: Mucous membranes are moist.   Neck: No stridor. Painless ROM.  Cardiovascular: Normal rate, regular rhythm. Grossly normal heart sounds.  Good peripheral circulation. Respiratory: Nmild tachypnea  No retractions. With diminished breathsounds in posterior bases Gastrointestinal: Soft and nontender. No distention. No abdominal bruits. No CVA tenderness. Genitourinary:  Musculoskeletal: No lower extremity tenderness nor edema.  No joint effusions. Neurologic:  Normal speech and language. No gross focal neurologic deficits are appreciated. No facial droop Skin:  Skin is warm, dry and intact. No rash noted. Psychiatric: Mood and affect are normal. Speech and behavior are normal.  ____________________________________________   LABS (all labs ordered are listed, but only abnormal results are displayed)  Results for orders placed or performed during the hospital encounter of 09/23/17 (from the past 24 hour(s))  Basic metabolic panel     Status: Abnormal   Collection Time: 09/23/17  4:45 PM  Result Value Ref Range   Sodium 133 (L) 135 - 145 mmol/L   Potassium 4.4 3.5 - 5.1 mmol/L   Chloride 95 (L) 101 - 111 mmol/L   CO2 28 22 - 32 mmol/L   Glucose, Bld 137 (H) 65 - 99 mg/dL   BUN 26 (H) 6 - 20 mg/dL   Creatinine, Ser 3.78 (H) 0.44 - 1.00 mg/dL   Calcium 9.0 8.9 - 58.8 mg/dL   GFR calc non Af Amer 35 (L) >60 mL/min    GFR calc Af Amer 41 (L) >60 mL/min   Anion gap 10 5 - 15  CBC     Status: Abnormal   Collection Time: 09/23/17  4:45 PM  Result Value Ref Range   WBC 3.8 3.6 - 11.0 K/uL   RBC 3.55 (L) 3.80 - 5.20 MIL/uL   Hemoglobin 11.2 (L) 12.0 - 16.0 g/dL   HCT 50.2 77.4 - 12.8 %   MCV 98.8 80.0 - 100.0 fL   MCH 31.5 26.0 - 34.0 pg   MCHC 31.9 (L) 32.0 - 36.0 g/dL   RDW 78.6 (H) 76.7 - 20.9 %   Platelets 137 (L) 150 - 440 K/uL  Troponin I     Status: None   Collection Time: 09/23/17  4:45 PM  Result Value Ref Range   Troponin I <0.03 <0.03 ng/mL  Brain natriuretic peptide  Status: Abnormal   Collection Time: 09/23/17  4:45 PM  Result Value Ref Range   B Natriuretic Peptide 1,688.0 (H) 0.0 - 100.0 pg/mL   ____________________________________________  EKG My review and personal interpretation at Time: 16:47   Indication: sob  Rate: 70  Rhythm: v paced Axis: left Other: paced rhythm ____________________________________________  RADIOLOGY  I personally reviewed all radiographic images ordered to evaluate for the above acute complaints and reviewed radiology reports and findings.  These findings were personally discussed with the patient.  Please see medical record for radiology report.  ____________________________________________   PROCEDURES  Procedure(s) performed:  .Critical Care Performed by: Willy Eddy, MD Authorized by: Willy Eddy, MD   Critical care provider statement:    Critical care time (minutes):  10   Critical care time was exclusive of:  Separately billable procedures and treating other patients   Critical care was necessary to treat or prevent imminent or life-threatening deterioration of the following conditions:  Respiratory failure   Critical care was time spent personally by me on the following activities:  Development of treatment plan with patient or surrogate, discussions with consultants, evaluation of patient's response to treatment,  examination of patient, obtaining history from patient or surrogate, ordering and performing treatments and interventions, ordering and review of laboratory studies, ordering and review of radiographic studies, pulse oximetry, re-evaluation of patient's condition and review of old charts      Critical Care performed: yes ____________________________________________   INITIAL IMPRESSION / ASSESSMENT AND PLAN / ED COURSE  Pertinent labs & imaging results that were available during my care of the patient were reviewed by me and considered in my medical decision making (see chart for details).   DDX: Asthma, copd, CHF, pna, ptx, malignancy, Pe, anemia  ADALEAH FORGET is a 84 y.o. who presents to the ED with symptoms as described above.  Patient arrives with significant weight gain and evidence of pulmonary edema volume up borderline anasarca but renal function fortunately appears adequate.  Do suspect simply worsening congestive heart failure likely secondary to component of noncompliance.   Clinical Course as of Sep 23 2320  Fri Sep 23, 2017  2104 Patient was found to be hypoxic to 85% on room air with a good waveform.  Patient complaining of worsening shortness of breath and given her 25 pound weight gain do feel patient will require hospitalization for further medical management, additional doses of IV Lasix and reassessment.   [PR]    Clinical Course User Index [PR] Willy Eddy, MD     As part of my medical decision making, I reviewed the following data within the electronic MEDICAL RECORD NUMBER Nursing notes reviewed and incorporated, Labs reviewed, notes from prior ED visits.   ____________________________________________   FINAL CLINICAL IMPRESSION(S) / ED DIAGNOSES  Final diagnoses:  Acute respiratory failure with hypoxia (HCC)  Acute on chronic congestive heart failure, unspecified heart failure type (HCC)      NEW MEDICATIONS STARTED DURING THIS VISIT:  New  Prescriptions   No medications on file     Note:  This document was prepared using Dragon voice recognition software and may include unintentional dictation errors.    Willy Eddy, MD 09/23/17 2322

## 2017-09-23 NOTE — Telephone Encounter (Signed)
I spoke with the patient and inform her with Joanna Roberts medical advice

## 2017-09-23 NOTE — ED Notes (Signed)
Report from nicole, rn.

## 2017-09-23 NOTE — H&P (Signed)
[facility] - [place] [facility]   PATIENT NAME: Joanna Roberts    MR#:  [id number]  DATE OF BIRTH:  02/28/1929  DATE OF ADMISSION:  09/23/2017  PRIMARY CARE PHYSICIAN: Patrice Paradise, MD   REQUESTING/REFERRING PHYSICIAN: Roxan Hockey, MD  CHIEF COMPLAINT:   Chief Complaint  Patient presents with  . Shortness of Breath    HISTORY OF PRESENT ILLNESS:  Joanna Roberts  is a 84 y.o. female who presents with progressive shortness of breath and lower extremity edema.  Patient states that she has had this issue before with her heart failure.  Over the last several days this has been getting worse again.  She came to the ED today for her shortness of breath.  She was given IV diuresis initially with good improvement in her breathing.  Hospitalist were called for admission and further treatment  PAST MEDICAL HISTORY:   Past Medical History:  Diagnosis Date  . AICD (automatic cardioverter/defibrillator) present   . Allergy   . Anemia   . Arthritis   . Cataract   . CHF (congestive heart failure) (HCC)   . Depression   . Dysrhythmia   . GERD (gastroesophageal reflux disease)   . Heart murmur   . History of TIAs   . Hypertension   . Hypothyroidism   . Myocardial infarction (HCC)   . Osteoporosis   . Presence of permanent cardiac pacemaker   . Thyroid disease      PAST SURGICAL HISTORY:   Past Surgical History:  Procedure Laterality Date  . ABDOMINAL HYSTERECTOMY    . APPENDECTOMY    . HERNIA REPAIR    . IMPLANTABLE CARDIOVERTER DEFIBRILLATOR (ICD) GENERATOR CHANGE Left 07/27/2017   Procedure: ICD GENERATOR CHANGE;  Surgeon: Marcina Millard, MD;  Location: [facility] ORS;  Service: Cardiovascular;  Laterality: Left;  . INSERT / REPLACE / REMOVE PACEMAKER    . LOWER EXTREMITY ANGIOGRAPHY Left 02/08/2017   Procedure: Lower Extremity Angiography;  Surgeon: Renford Dills, MD;  Location: [facility] INVASIVE CV LAB;  Service: Cardiovascular;  Laterality: Left;   . PERIPHERAL VASCULAR CATHETERIZATION Right 10/08/2014   Procedure: Lower Extremity Angiography;  Surgeon: Renford Dills, MD;  Location: [facility] INVASIVE CV LAB;  Service: Cardiovascular;  Laterality: Right;  . PERIPHERAL VASCULAR CATHETERIZATION  10/08/2014   Procedure: Lower Extremity Intervention;  Surgeon: Renford Dills, MD;  Location: [facility] INVASIVE CV LAB;  Service: Cardiovascular;;  . PERIPHERAL VASCULAR CATHETERIZATION N/A 03/23/2016   Procedure: Visceral Angiography;  Surgeon: Renford Dills, MD;  Location: [facility] INVASIVE CV LAB;  Service: Cardiovascular;  Laterality: N/A;  . pessary       SOCIAL HISTORY:   Social History   Tobacco Use  . Smoking status: Never Smoker  . Smokeless tobacco: Never Used  Substance Use Topics  . Alcohol use: No     FAMILY HISTORY:   Family History  Problem Relation Age of Onset  . Early death Mother   . Heart disease Mother   . Arthritis Father   . Asthma Father   . Stroke Sister      DRUG ALLERGIES:   Allergies  Allergen Reactions  . Lortab [Hydrocodone-Acetaminophen] Itching, Swelling and Rash  . Doxycycline Diarrhea  . Macrobid [Nitrofurantoin Monohyd Macro] Diarrhea and Nausea And Vomiting  . Nitrofurantoin Diarrhea, Nausea Only and Nausea And Vomiting    MEDICATIONS AT HOME:   Prior to Admission medications   Medication Sig Start Date End Date Taking? Authorizing Provider  acetaminophen (TYLENOL) 500  MG tablet Take 1,000 mg by mouth every 8 (eight) hours as needed for mild pain or headache.    [provider]  aspirin EC 81 MG tablet Take 1 tablet (81 mg total) by mouth daily. Patient not taking: Reported on 09/20/2017 02/08/17   Schnier, Latina Craver, MD  carvedilol (COREG) 25 MG tablet TAKE ONE TABLET BY MOUTH TWICE DAILY 10/04/16   [provider]  cephALEXin (KEFLEX) 250 MG capsule Take 1 capsule (250 mg total) by mouth 4 (four) times daily. Patient not taking: Reported on 09/20/2017 07/27/17    Marcina Millard, MD  furosemide (LASIX) 20 MG tablet Take 1 tablet (20 mg total) by mouth daily as needed for fluid. Patient taking differently: Take 20 mg by mouth daily as needed for fluid (for legs swelling).  08/29/16   Adrian Saran, MD  gabapentin (NEURONTIN) 300 MG capsule Take 1 capsule (300 mg total) at bedtime by mouth. Patient taking differently: Take 300 mg by mouth 2 (two) times daily.  03/03/17   Edward Jolly, MD  hydrochlorothiazide (HYDRODIURIL) 25 MG tablet Take 25 mg by mouth daily.  07/01/17   [provider]  levalbuterol Pauline Aus HFA) 45 MCG/ACT inhaler Inhale into the lungs. 09/05/17 09/05/18  [provider]  levothyroxine (SYNTHROID, LEVOTHROID) 25 MCG tablet TAKE 1 TABLET EVERY DAY ON EMPTY STOMACHWITH A GLASS OF WATER AT LEAST 30-60 MINBEFORE BREAKFAST 11/29/16   [provider]  losartan (COZAAR) 25 MG tablet TAKE ONE TABLET EVERY DAY IN THE MORNING 07/30/15   [provider]  nitroGLYCERIN (NITROSTAT) 0.4 MG SL tablet Place 0.4 mg under the tongue every 5 (five) minutes as needed for chest pain.  09/22/16   [provider]  omeprazole (PRILOSEC) 20 MG capsule  08/16/17   [provider]  ondansetron (ZOFRAN-ODT) 4 MG disintegrating tablet  09/05/17   [provider]  oxyCODONE-acetaminophen (PERCOCET/ROXICET) 5-325 MG tablet Take 1 tablet by mouth 2 (two) times daily as needed for severe pain. For chronic pain Fill on or after 05/03/2017, 06/02/2017 05/03/17   Edward Jolly, MD  ranitidine (ZANTAC) 150 MG capsule  08/16/17   [provider]  silver sulfADIAZINE (SILVADENE) 1 % cream Apply 1 application topically 2 (two) times daily. Apply to area twice a day Patient taking differently: Apply 1 application topically 2 (two) times daily as needed (for ulcers on ankles.).  04/29/17   Stegmayer, Cala Bradford A, PA-C  temazepam (RESTORIL) 7.5 MG capsule Take by mouth. 08/24/17 09/23/17  [provider]  triamcinolone  cream (KENALOG) 0.1 % Apply 1 application topically 2 (two) times daily as needed (for rash/skin irritation.).  04/28/17 04/28/18  [provider]  vitamin B-12 (CYANOCOBALAMIN) 1000 MCG tablet Take 1,000 mcg by mouth daily.    [provider]    REVIEW OF SYSTEMS:  Review of Systems  Constitutional: Negative for chills, fever, malaise/fatigue and weight loss.  HENT: Negative for ear pain, hearing loss and tinnitus.   Eyes: Negative for blurred vision, double vision, pain and redness.  Respiratory: Positive for shortness of breath. Negative for cough and hemoptysis.   Cardiovascular: Positive for leg swelling. Negative for chest pain, palpitations and orthopnea.  Gastrointestinal: Negative for abdominal pain, constipation, diarrhea, nausea and vomiting.  Genitourinary: Negative for dysuria, frequency and hematuria.  Musculoskeletal: Negative for back pain, joint pain and neck pain.  Skin:       No acne, rash, or lesions  Neurological: Negative for dizziness, tremors, focal weakness and weakness.  Endo/Heme/Allergies: Negative  for polydipsia. Does not bruise/bleed easily.  Psychiatric/Behavioral: Negative for depression. The patient is not nervous/anxious and does not have insomnia.      VITAL SIGNS:   Vitals:   09/23/17 2200 09/23/17 2300 09/23/17 2301 09/23/17 2315  BP: 112/86 139/69    Pulse: 72 74 67 72  Resp: 15 16 20 17   Temp:      TempSrc:      SpO2: 100% 100% 100% 100%  Weight:      Height:       Wt Readings from Last 3 Encounters:  09/23/17 63.5 kg (140 lb)  09/20/17 63.5 kg (140 lb)  08/25/17 59.4 kg (131 lb)    PHYSICAL EXAMINATION:  Physical Exam  Vitals reviewed. Constitutional: She is oriented to person, place, and time. She appears well-developed and well-nourished. No distress.  HENT:  Head: Normocephalic and atraumatic.  Mouth/Throat: Oropharynx is clear and moist.  Eyes: Pupils are equal, round, and reactive to light. Conjunctivae and EOM  are normal. No scleral icterus.  Neck: Normal range of motion. Neck supple. No JVD present. No thyromegaly present.  Cardiovascular: Normal rate, regular rhythm and intact distal pulses. Exam reveals no gallop and no friction rub.  No murmur heard. Respiratory: Effort normal. No respiratory distress. She has no wheezes. She has rales.  GI: Soft. Bowel sounds are normal. She exhibits no distension. There is no tenderness.  Musculoskeletal: Normal range of motion. She exhibits edema.  No arthritis, no gout  Lymphadenopathy:    She has no cervical adenopathy.  Neurological: She is alert and oriented to person, place, and time. No cranial nerve deficit.  No dysarthria, no aphasia  Skin: Skin is warm and dry. No rash noted. No erythema.  Psychiatric: She has a normal mood and affect. Her behavior is normal. Judgment and thought content normal.    LABORATORY PANEL:   CBC Recent Labs  Lab 09/23/17 1645  WBC 3.8  HGB 11.2*  HCT 35.1  PLT 137*   ------------------------------------------------------------------------------------------------------------------  Chemistries  Recent Labs  Lab 09/23/17 1645  NA 133*  K 4.4  CL 95*  CO2 28  GLUCOSE 137*  BUN 26*  CREATININE 1.33*  CALCIUM 9.0   ------------------------------------------------------------------------------------------------------------------  Cardiac Enzymes Recent Labs  Lab 09/23/17 1645  TROPONINI <0.03   ------------------------------------------------------------------------------------------------------------------  RADIOLOGY:  Dg Chest 2 View  Result Date: 09/23/2017 CLINICAL DATA:  Shortness of breath EXAM: CHEST - 2 VIEW COMPARISON:  08/25/2017 FINDINGS: Cardiac shadow is enlarged. Defibrillator is noted and stable. The lungs are well aerated bilaterally. Mild chronic interstitial changes are seen. Stable blunting of the costophrenic angles is noted. Chronic changes in the thoracic spine are noted.  IMPRESSION: Chronic changes without acute abnormality. Electronically Signed   By: 10/25/2017 M.D.   On: 09/23/2017 17:33    EKG:   Orders placed or performed during the hospital encounter of 09/23/17  . ED EKG within 10 minutes  . ED EKG within 10 minutes    IMPRESSION AND PLAN:  Principal Problem:   Acute on chronic combined systolic and diastolic CHF (congestive heart failure) (HCC) -IV diuresis given in the ED with good results, will admit her to the telemetry floor with cardiology consult and echocardiogram planned for tomorrow Active Problems:   Atrial fibrillation, chronic (HCC) -continue home meds   HTN (hypertension) -home dose antihypertensives   Acquired hypothyroidism -home dose thyroid replacement   GERD (gastroesophageal reflux disease) -continue home meds  Chart review performed and case discussed with ED provider.  Labs, imaging and/or ECG reviewed by provider and discussed with patient/family. Management plans discussed with the patient and/or family.  DVT PROPHYLAXIS: SubQ lovenox  GI PROPHYLAXIS: PPI  ADMISSION STATUS: Inpatient  CODE STATUS: Full Code Status History    Date Active Date Inactive Code Status Order ID Comments User Context   02/08/2017 1410 02/08/2017 1746 Full Code [id number]  Renford Dills, MD Inpatient   08/27/2016 1914 08/29/2016 1938 DNR [id number]  Shaune Pollack, MD Inpatient   06/01/2016 1907 06/03/2016 1739 DNR [id number]  Ramonita Lab, MD ED   03/23/2016 1512 03/24/2016 2346 Full Code [id number]  Gilda Crease, Latina Craver, MD Inpatient   03/23/2016 1430 03/23/2016 1503 Full Code [id number]  Gilda Crease Latina Craver, MD Inpatient   03/11/2015 1635 03/18/2015 1741 Full Code [id number]  Ramonita Lab, MD Inpatient   03/11/2015 1438 03/11/2015 1635 Full Code [id number]  Ramonita Lab, MD ED   10/08/2014 1050 10/08/2014 1712 Full Code [id number]  Schnier, Latina Craver, MD Inpatient      TOTAL TIME TAKING CARE OF THIS PATIENT: 45 minutes.   Anne Hahn, Amarian Botero  FIELDING 09/23/2017, 11:43 PM  [facility]  Office  [phone]  CC: Primary care physician; Patrice Paradise, MD  Note:  This document was prepared using Dragon voice recognition software and may include unintentional dictation errors.

## 2017-09-24 ENCOUNTER — Other Ambulatory Visit: Payer: Self-pay

## 2017-09-24 ENCOUNTER — Inpatient Hospital Stay
Admit: 2017-09-24 | Discharge: 2017-09-24 | Disposition: A | Discharge status: Home / Self Care | Payer: PPO | Attending: Internal Medicine | Admitting: Internal Medicine

## 2017-09-24 LAB — BASIC METABOLIC PANEL
ANION GAP: 9 (ref 5–15)
BUN: 24 mg/dL — ABNORMAL HIGH (ref 6–20)
CALCIUM: 8.8 mg/dL — AB (ref 8.9–10.3)
CO2: 30 mmol/L (ref 22–32)
Chloride: 95 mmol/L — ABNORMAL LOW (ref 101–111)
Creatinine, Ser: 1.2 mg/dL — ABNORMAL HIGH (ref 0.44–1.00)
GFR calc non Af Amer: 40 mL/min — ABNORMAL LOW (ref >60)
GFR, EST AFRICAN AMERICAN: 46 mL/min — AB (ref >60)
Glucose, Bld: 119 mg/dL — ABNORMAL HIGH (ref 65–99)
POTASSIUM: 4.1 mmol/L (ref 3.5–5.1)
Sodium: 134 mmol/L — ABNORMAL LOW (ref 135–145)

## 2017-09-24 LAB — CBC
HEMATOCRIT: 34.4 % — AB (ref 35.0–47.0)
Hemoglobin: 11.1 g/dL — ABNORMAL LOW (ref 12.0–16.0)
MCH: 31.4 pg (ref 26.0–34.0)
MCHC: 32.3 g/dL (ref 32.0–36.0)
MCV: 97.3 fL (ref 80.0–100.0)
Platelets: 124 10*3/uL — ABNORMAL LOW (ref 150–440)
RBC: 3.54 MIL/uL — AB (ref 3.80–5.20)
RDW: 15.7 % — ABNORMAL HIGH (ref 11.5–14.5)
WBC: 4.2 10*3/uL (ref 3.6–11.0)

## 2017-09-24 LAB — MRSA PCR SCREENING: MRSA BY PCR: NEGATIVE

## 2017-09-24 MED ORDER — OXYCODONE-ACETAMINOPHEN 5-325 MG PO TABS
1.0000 | ORAL_TABLET | Freq: Two times a day (BID) | ORAL | Status: DC | PRN
Start: 2017-09-24 — End: 2017-09-26
  Administered 2017-09-24 – 2017-09-25 (×3): 1 via ORAL
  Filled 2017-09-24 (×3): qty 1

## 2017-09-24 MED ORDER — FAMOTIDINE 20 MG PO TABS
20.0000 mg | ORAL_TABLET | Freq: Every day | ORAL | Status: DC
  Administered 2017-09-24 – 2017-09-26 (×3): 20 mg via ORAL
  Filled 2017-09-24 (×3): qty 1

## 2017-09-24 MED ORDER — ENOXAPARIN SODIUM 30 MG/0.3ML ~~LOC~~ SOLN
30.0000 mg | SUBCUTANEOUS | Status: DC
  Administered 2017-09-24 – 2017-09-26 (×3): 30 mg via SUBCUTANEOUS
  Filled 2017-09-24 (×3): qty 0.3

## 2017-09-24 MED ORDER — LOSARTAN POTASSIUM 25 MG PO TABS
25.0000 mg | ORAL_TABLET | Freq: Every day | ORAL | Status: DC
  Administered 2017-09-24: 25 mg via ORAL
  Filled 2017-09-24 (×2): qty 1

## 2017-09-24 MED ORDER — PANTOPRAZOLE SODIUM 40 MG PO TBEC
40.0000 mg | DELAYED_RELEASE_TABLET | Freq: Every day | ORAL | Status: DC
  Administered 2017-09-24 – 2017-09-26 (×3): 40 mg via ORAL
  Filled 2017-09-24 (×3): qty 1

## 2017-09-24 MED ORDER — CARVEDILOL 25 MG PO TABS
25.0000 mg | ORAL_TABLET | Freq: Two times a day (BID) | ORAL | Status: DC
  Administered 2017-09-24: 25 mg via ORAL
  Filled 2017-09-24 (×3): qty 1

## 2017-09-24 MED ORDER — ONDANSETRON HCL 4 MG/2ML IJ SOLN: 4.0000 mg | Freq: Four times a day (QID) | INTRAMUSCULAR | Status: DC | PRN

## 2017-09-24 MED ORDER — SPIRONOLACTONE 25 MG PO TABS
12.5000 mg | ORAL_TABLET | Freq: Every day | ORAL | Status: DC
  Administered 2017-09-24: 12.5 mg via ORAL
  Filled 2017-09-24 (×2): qty 0.5
  Filled 2017-09-24 (×2): qty 1

## 2017-09-24 MED ORDER — FUROSEMIDE 10 MG/ML IJ SOLN
20.0000 mg | Freq: Two times a day (BID) | INTRAMUSCULAR | Status: DC
  Administered 2017-09-24 – 2017-09-26 (×3): 20 mg via INTRAVENOUS
  Filled 2017-09-24 (×4): qty 2

## 2017-09-24 MED ORDER — TRAZODONE HCL 50 MG PO TABS
25.0000 mg | ORAL_TABLET | Freq: Every evening | ORAL | Status: DC | PRN
  Administered 2017-09-24 – 2017-09-25 (×2): 25 mg via ORAL
  Filled 2017-09-24 (×2): qty 1

## 2017-09-24 MED ORDER — ACETAMINOPHEN 325 MG PO TABS
650.0000 mg | ORAL_TABLET | Freq: Four times a day (QID) | ORAL | Status: DC | PRN
  Administered 2017-09-24: 650 mg via ORAL
  Filled 2017-09-24: qty 2

## 2017-09-24 MED ORDER — GABAPENTIN 300 MG PO CAPS
300.0000 mg | ORAL_CAPSULE | Freq: Two times a day (BID) | ORAL | Status: DC
  Administered 2017-09-24 – 2017-09-26 (×5): 300 mg via ORAL
  Filled 2017-09-24 (×5): qty 1

## 2017-09-24 MED ORDER — ORAL CARE MOUTH RINSE
15.0000 mL | Freq: Two times a day (BID) | OROMUCOSAL | Status: DC
  Administered 2017-09-24 (×2): 15 mL via OROMUCOSAL

## 2017-09-24 MED ORDER — ONDANSETRON HCL 4 MG PO TABS: 4.0000 mg | ORAL_TABLET | Freq: Four times a day (QID) | ORAL | Status: DC | PRN

## 2017-09-24 MED ORDER — ASPIRIN EC 81 MG PO TBEC
81.0000 mg | DELAYED_RELEASE_TABLET | Freq: Every day | ORAL | Status: DC
  Administered 2017-09-24: 81 mg via ORAL
  Filled 2017-09-24 (×3): qty 1

## 2017-09-24 MED ORDER — ACETAMINOPHEN 650 MG RE SUPP: 650.0000 mg | Freq: Four times a day (QID) | RECTAL | Status: DC | PRN

## 2017-09-24 MED ORDER — LEVOTHYROXINE SODIUM 25 MCG PO TABS
25.0000 ug | ORAL_TABLET | Freq: Every day | ORAL | Status: DC
  Administered 2017-09-24 – 2017-09-26 (×3): 25 ug via ORAL
  Filled 2017-09-24 (×3): qty 1

## 2017-09-24 NOTE — Progress Notes (Signed)
Talked to doctor Sainani about pt being on DNR and IV diuresis (per [place] notes)  but pt has no order place. Doctor Golden states he will place the order. Will continue to monitor.

## 2017-09-24 NOTE — Consult Note (Signed)
[facility] Cardiology Consultation Note  Patient ID: Joanna Roberts, MRN: [id number], DOB/AGE: 26-Sep-1928 84 y.o. Admit date: 09/23/2017   Date of Consult: 09/24/2017 Primary Physician: Patrice Paradise, MD Primary Cardiologist: Fath  Chief Complaint:  Chief Complaint  Patient presents with  . Shortness of Breath   Reason for Consult: Heart failure  HPI: 84 y.o. female with known apparent dilated cardiomyopathy and congestive heart failure and aortic valve stenosis status post previous defibrillator placement who has had progressive symptoms of lower extremity edema and abdominal edema with shortness of breath.  This is progressive over the last several weeks for which she had compression stockings and Unna boots which have helped slightly but she has continued worsening symptoms requiring further intervention including intravenous Lasix.  The patient did arrive with a normal troponin and an EKG showing paced rhythm with a BNP of  1688.  The patient has had slight improvement after intravenous Lasix.  Previously she has been on angiotensin receptor blocker beta-blocker for previous cardiomyopathy but has not used spironolactone.  The patient has no current evidence of myocardial infarction and is improving overnight  Past Medical History:  Diagnosis Date  . AICD (automatic cardioverter/defibrillator) present   . Allergy   . Anemia   . Arthritis   . Cataract   . CHF (congestive heart failure) (HCC)   . Depression   . Dysrhythmia   . GERD (gastroesophageal reflux disease)   . Heart murmur   . History of TIAs   . Hypertension   . Hypothyroidism   . Myocardial infarction (HCC)   . Osteoporosis   . Presence of permanent cardiac pacemaker   . Thyroid disease       Surgical History:  Past Surgical History:  Procedure Laterality Date  . ABDOMINAL HYSTERECTOMY    . APPENDECTOMY    . HERNIA REPAIR    . IMPLANTABLE CARDIOVERTER DEFIBRILLATOR (ICD) GENERATOR CHANGE Left 07/27/2017    Procedure: ICD GENERATOR CHANGE;  Surgeon: Marcina Millard, MD;  Location: [facility] ORS;  Service: Cardiovascular;  Laterality: Left;  . INSERT / REPLACE / REMOVE PACEMAKER    . LOWER EXTREMITY ANGIOGRAPHY Left 02/08/2017   Procedure: Lower Extremity Angiography;  Surgeon: Renford Dills, MD;  Location: [facility] INVASIVE CV LAB;  Service: Cardiovascular;  Laterality: Left;  . PERIPHERAL VASCULAR CATHETERIZATION Right 10/08/2014   Procedure: Lower Extremity Angiography;  Surgeon: Renford Dills, MD;  Location: [facility] INVASIVE CV LAB;  Service: Cardiovascular;  Laterality: Right;  . PERIPHERAL VASCULAR CATHETERIZATION  10/08/2014   Procedure: Lower Extremity Intervention;  Surgeon: Renford Dills, MD;  Location: [facility] INVASIVE CV LAB;  Service: Cardiovascular;;  . PERIPHERAL VASCULAR CATHETERIZATION N/A 03/23/2016   Procedure: Visceral Angiography;  Surgeon: Renford Dills, MD;  Location: [facility] INVASIVE CV LAB;  Service: Cardiovascular;  Laterality: N/A;  . pessary       Home Meds: Prior to Admission medications   Medication Sig Start Date End Date Taking? Authorizing Provider  acetaminophen (TYLENOL) 500 MG tablet Take 1,000 mg by mouth every 8 (eight) hours as needed for mild pain or headache.   Yes [provider]  carvedilol (COREG) 25 MG tablet TAKE ONE TABLET BY MOUTH TWICE DAILY 10/04/16  Yes [provider]  furosemide (LASIX) 20 MG tablet Take 1 tablet (20 mg total) by mouth daily as needed for fluid. Patient taking differently: Take 20 mg by mouth daily as needed for fluid (for legs swelling).  08/29/16  Yes Adrian Saran, MD  gabapentin (  NEURONTIN) 300 MG capsule Take 1 capsule (300 mg total) at bedtime by mouth. Patient taking differently: Take 300 mg by mouth 2 (two) times daily.  03/03/17  Yes Edward Jolly, MD  hydrochlorothiazide (HYDRODIURIL) 25 MG tablet Take 25 mg by mouth daily.  07/01/17  Yes [provider]  levalbuterol (XOPENEX HFA) 45 MCG/ACT  inhaler Inhale 1-2 puffs into the lungs every 6 (six) hours as needed for wheezing or shortness of breath.  09/05/17 09/05/18 Yes [provider]  levothyroxine (SYNTHROID, LEVOTHROID) 25 MCG tablet TAKE 1 TABLET EVERY DAY ON EMPTY STOMACHWITH A GLASS OF WATER AT LEAST 30-60 MINBEFORE BREAKFAST 11/29/16  Yes [provider]  nitroGLYCERIN (NITROSTAT) 0.4 MG SL tablet Place 0.4 mg under the tongue every 5 (five) minutes as needed for chest pain.  09/22/16  Yes [provider]  omeprazole (PRILOSEC) 20 MG capsule Take 20 mg by mouth daily.  08/16/17  Yes [provider]  oxyCODONE-acetaminophen (PERCOCET/ROXICET) 5-325 MG tablet Take 1 tablet by mouth 2 (two) times daily as needed for severe pain. For chronic pain Fill on or after 05/03/2017, 06/02/2017 05/03/17  Yes Lateef, Roselie Skinner, MD  silver sulfADIAZINE (SILVADENE) 1 % cream Apply 1 application topically 2 (two) times daily. Apply to area twice a day Patient taking differently: Apply 1 application topically 2 (two) times daily as needed (for ulcers on ankles.).  04/29/17  Yes Stegmayer, Cala Bradford A, PA-C  triamcinolone cream (KENALOG) 0.1 % Apply 1 application topically 2 (two) times daily as needed (for rash/skin irritation.).  04/28/17 04/28/18 Yes [provider]  vitamin B-12 (CYANOCOBALAMIN) 1000 MCG tablet Take 1,000 mcg by mouth daily.   Yes [provider]  aspirin EC 81 MG tablet Take 1 tablet (81 mg total) by mouth daily. Patient not taking: Reported on 09/20/2017 02/08/17   Schnier, Latina Craver, MD    Inpatient Medications:  . aspirin EC  81 mg Oral Daily  . carvedilol  25 mg Oral BID  . enoxaparin (LOVENOX) injection  30 mg Subcutaneous Q24H  . famotidine  20 mg Oral Daily  . gabapentin  300 mg Oral BID  . levothyroxine  25 mcg Oral QAC breakfast  . losartan  25 mg Oral Daily  . mouth rinse  15 mL Mouth Rinse BID  . pantoprazole  40 mg Oral Daily     Allergies:  Allergies  Allergen Reactions   . Lortab [Hydrocodone-Acetaminophen] Itching, Swelling and Rash  . Doxycycline Diarrhea  . Macrobid [Nitrofurantoin Monohyd Macro] Diarrhea and Nausea And Vomiting  . Nitrofurantoin Diarrhea, Nausea Only and Nausea And Vomiting    Social History   Socioeconomic History  . Marital status: Widowed    Spouse name: Not on file  . Number of children: Not on file  . Years of education: Not on file  . Highest education level: Not on file  Occupational History  . Not on file  Social Needs  . Financial resource strain: Not on file  . Food insecurity:    Worry: Not on file    Inability: Not on file  . Transportation needs:    Medical: Not on file    Non-medical: Not on file  Tobacco Use  . Smoking status: Never Smoker  . Smokeless tobacco: Never Used  Substance and Sexual Activity  . Alcohol use: No  . Drug use: No  . Sexual activity: Not Currently    Birth control/protection: Post-menopausal  Lifestyle  . Physical activity:    Days per week: Not  on file    Minutes per session: Not on file  . Stress: Not on file  Relationships  . Social connections:    Talks on phone: Not on file    Gets together: Not on file    Attends religious service: Not on file    Active member of club or organization: Not on file    Attends meetings of clubs or organizations: Not on file    Relationship status: Not on file  . Intimate partner violence:    Fear of current or ex partner: Not on file    Emotionally abused: Not on file    Physically abused: Not on file    Forced sexual activity: Not on file  Other Topics Concern  . Not on file  Social History Narrative  . Not on file     Family History  Problem Relation Age of Onset  . Early death Mother   . Heart disease Mother   . Arthritis Father   . Asthma Father   . Stroke Sister      Review of Systems Positive for lower extremity edema weakness fatigue PND orthopnea Negative for: General:  chills, fever, night sweats or weight  changes.  Cardiovascular: Positive for PND orthopnea negative for syncope dizziness  Dermatological skin lesions rashes Respiratory: Cough congestion Urologic: Frequent urination urination at night and hematuria Abdominal: negative for nausea, vomiting, diarrhea, bright red blood per rectum, melena, or hematemesis Neurologic: negative for visual changes, and/or hearing changes  All other systems reviewed and are otherwise negative except as noted above.  Labs: Recent Labs    09/23/17 1645  TROPONINI <0.03   Lab Results  Component Value Date   WBC 4.2 09/24/2017   HGB 11.1 (L) 09/24/2017   HCT 34.4 (L) 09/24/2017   MCV 97.3 09/24/2017   PLT 124 (L) 09/24/2017    Recent Labs  Lab 09/24/17 0535  NA 134*  K 4.1  CL 95*  CO2 30  BUN 24*  CREATININE 1.20*  CALCIUM 8.8*  GLUCOSE 119*   Lab Results  Component Value Date   CHOL 136 11/02/2013   HDL 34 (L) 11/02/2013   LDLCALC 85 11/02/2013   TRIG 85 11/02/2013   No results found for: DDIMER  Radiology/Studies:  Dg Chest 2 View  Result Date: 09/23/2017 CLINICAL DATA:  Shortness of breath EXAM: CHEST - 2 VIEW COMPARISON:  08/25/2017 FINDINGS: Cardiac shadow is enlarged. Defibrillator is noted and stable. The lungs are well aerated bilaterally. Mild chronic interstitial changes are seen. Stable blunting of the costophrenic angles is noted. Chronic changes in the thoracic spine are noted. IMPRESSION: Chronic changes without acute abnormality. Electronically Signed   By: Alcide Clever M.D.   On: 09/23/2017 17:33   Dg Chest 2 View  Result Date: 08/25/2017 CLINICAL DATA:  84 year old female with increasing shortness of breath and chest pain following pacemaker revision about 4 weeks ago. EXAM: CHEST - 2 VIEW COMPARISON:  Chest CTA 08/19/2017. Radiographs 07/21/2017, and earlier. FINDINGS: Left chest AICD redemonstrated. Soft tissue swelling about the pacemaker generator device was more apparent on the CTA 08/19/2017 (please see that  report). No pneumothorax. Stable cardiomegaly and mediastinal contours. Stable lung volumes. Small bilateral pleural effusions likely persists since the chest CTA. No pulmonary edema or acute pulmonary opacity. Chronic upper lobe predominant reticular interstitial opacity appears stable. Osteopenia with multilevel thoracic and lumbar compression fractures. Stable visualized osseous structures. Calcified aortic atherosclerosis. Negative visible bowel gas pattern. IMPRESSION: 1. Persistent small pleural effusions  as seen on the chest CTA 08/19/2017. No new cardiopulmonary abnormality. 2. Soft tissue swelling/collection about the left chest AICD generator better demonstrated on the recent CTA. Electronically Signed   By: Odessa Fleming M.D.   On: 08/25/2017 12:05    EKG: Paced rhythm  Weights: Filed Weights   09/23/17 1643  Weight: 140 lb (63.5 kg)     Physical Exam: Blood pressure 136/82, pulse 71, temperature 97.7 F (36.5 C), temperature source Oral, resp. rate 18, height 5\' 4"  (1.626 m), weight 140 lb (63.5 kg), SpO2 98 %. Body mass index is 24.03 kg/m. General: Well developed, well nourished, in no acute distress. Head eyes ears nose throat: Normocephalic, atraumatic, sclera non-icteric, no xanthomas, nares are without discharge. No apparent thyromegaly and/or mass  Lungs: Normal respiratory effort.  Few wheezes, basilar rales, no rhonchi.  Heart: RRR with normal S1 S2.  3+ aortic murmur gallop, no rub, PMI is normal size and placement, carotid upstroke normal without bruit, jugular venous pressure is normal Abdomen: Soft, non-tender, non-distended with normoactive bowel sounds. No hepatomegaly. No rebound/guarding. No obvious abdominal masses. Abdominal aorta is normal size without bruit Extremities: 2+ edema. no cyanosis, no clubbing, no ulcers  Peripheral : 2+ bilateral upper extremity pulses, 1 + bilateral femoral pulses, 2+ bilateral dorsal pedal pulse Neuro: Alert and oriented. No facial  asymmetry. No focal deficit. Moves all extremities spontaneously. Musculoskeletal: Normal muscle tone without kyphosis Psych:  Responds to questions appropriately with a normal affect.    Assessment: 84 year old female with acute on chronic systolic dysfunction congestive heart failure with severe lower extremity edema on appropriate medication management now slightly improved after intravenous Lasix  Plan: 1.  Continue intravenous Lasix for acute on chronic systolic dysfunction heart failure 2.  Continue lower extremity wrapping for further treatment of edema 3.  No change in carvedilol losartan and treatment for cardiomyopathy but consider addition of Aldactone 4.  No further cardiac diagnostics necessary at this time 5.  Begin ambulation and follow for improvements of symptoms and possible discharge home if ambulating well with further treatment options as outpatient next week  Signed, 88 M.D. [facility] Cardiology 09/24/2017, 9:06 AM

## 2017-09-24 NOTE — Plan of Care (Signed)
  Problem: Education: Goal: Knowledge of General Education information will improve Outcome: Progressing   Problem: Clinical Measurements: Goal: Will remain free from infection Outcome: Progressing   Problem: Safety: Goal: Ability to remain free from injury will improve Outcome: Progressing   Problem: Education: Goal: Ability to demonstrate management of disease process will improve Outcome: Progressing

## 2017-09-24 NOTE — ED Notes (Signed)
Patient transported to 253 

## 2017-09-24 NOTE — Progress Notes (Addendum)
Pt complaining of right leg pain at (0142 ) 7 ouf 10. Pt states she wants to try tylenol first. Tylenol was given at 0144.   At 0345 pt did called and reported that she has a pain of 9 out 10 on Right leg. Pt right leg was assessed. She does have +1 pulses and can moved and wiggle her right leg including her toe. Oxycodone was given at 0349. Will continue to monitor.  At 0440 pt was asleep.

## 2017-09-24 NOTE — Progress Notes (Signed)
Family Meeting Note  Advance Directive:yes  Today a meeting took place with the Patient.  The following clinical team members were present during this meeting:MD  The following were discussed:Patient's diagnosis: DNR, Patient's progosis: Unable to determine and Goals for treatment: DNR  Additional follow-up to be provided: cardiology  Time spent during discussion:20 minutes  Joanna Dilling, MD

## 2017-09-24 NOTE — Progress Notes (Signed)
PHARMACIST - PHYSICIAN COMMUNICATION  CONCERNING:  Enoxaparin (Lovenox) for DVT Prophylaxis   RECOMMENDATION: Patient was prescribed enoxaprin 40mg  q24 hours for VTE prophylaxis.   Filed Weights   09/23/17 1643  Weight: 140 lb (63.5 kg)    Body mass index is 24.03 kg/m.  Estimated Creatinine Clearance: 26.2 mL/min (A) (by C-G formula based on SCr of 1.33 mg/dL (H)).   Patient is candidate for enoxaparin 30mg  every 24 hours based on CrCl <30ml/min  DESCRIPTION: Pharmacy has adjusted enoxaparin dose.  Patient is now receiving enoxaparin 30mg  every 24 hours.  , PharmD, BCPS Clinical Pharmacist 09/24/2017 1:20 AM

## 2017-09-24 NOTE — Progress Notes (Signed)
[employer] [facility] - [place] at [facility]   PATIENT NAME: Joanna Roberts    MR#:  [id number]  DATE OF BIRTH:  03/02/1929  SUBJECTIVE:  CHIEF COMPLAINT:   Chief Complaint  Patient presents with  . Shortness of Breath    Feels SOB, have CHF.  REVIEW OF SYSTEMS:  CONSTITUTIONAL: No fever, fatigue or weakness.  EYES: No blurred or double vision.  EARS, NOSE, AND THROAT: No tinnitus or ear pain.  RESPIRATORY: No cough,have shortness of breath,no wheezing or hemoptysis.  CARDIOVASCULAR: No chest pain, orthopnea, edema.  GASTROINTESTINAL: No nausea, vomiting, diarrhea or abdominal pain.  GENITOURINARY: No dysuria, hematuria.  ENDOCRINE: No polyuria, nocturia,  HEMATOLOGY: No anemia, easy bruising or bleeding SKIN: No rash or lesion. MUSCULOSKELETAL: No joint pain or arthritis.   NEUROLOGIC: No tingling, numbness, weakness.  PSYCHIATRY: No anxiety or depression.   ROS  DRUG ALLERGIES:   Allergies  Allergen Reactions  . Lortab [Hydrocodone-Acetaminophen] Itching, Swelling and Rash  . Doxycycline Diarrhea  . Macrobid [Nitrofurantoin Monohyd Macro] Diarrhea and Nausea And Vomiting  . Nitrofurantoin Diarrhea, Nausea Only and Nausea And Vomiting    VITALS:  Blood pressure 136/82, pulse 71, temperature 97.7 F (36.5 C), temperature source Oral, resp. rate 18, height 5\' 4"  (1.626 m), weight 63.5 kg (140 lb), SpO2 98 %.  PHYSICAL EXAMINATION:  GENERAL:  84 y.o.-year-old patient lying in the bed with no acute distress.  EYES: Pupils equal, round, reactive to light and accommodation. No scleral icterus. Extraocular muscles intact.  HEENT: Head atraumatic, normocephalic. Oropharynx and nasopharynx clear.  NECK:  Supple, no jugular venous distention. No thyroid enlargement, no tenderness.  LUNGS: Normal breath sounds bilaterally, no wheezing, have crepitation. No use of accessory muscles of respiration.  CARDIOVASCULAR: S1, S2 normal. No murmurs, rubs, or gallops.  ABDOMEN:  Soft, nontender, nondistended. Bowel sounds present. No organomegaly or mass.  EXTREMITIES: have pedal edema,no cyanosis, or clubbing.  NEUROLOGIC: Cranial nerves II through XII are intact. Muscle strength 4/5 in all extremities. Sensation intact. Gait not checked.  PSYCHIATRIC: The patient is alert and oriented x 3.  SKIN: No obvious rash, lesion, or ulcer.   Physical Exam LABORATORY PANEL:   CBC Recent Labs  Lab 09/24/17 0535  WBC 4.2  HGB 11.1*  HCT 34.4*  PLT 124*   ------------------------------------------------------------------------------------------------------------------  Chemistries  Recent Labs  Lab 09/24/17 0535  NA 134*  K 4.1  CL 95*  CO2 30  GLUCOSE 119*  BUN 24*  CREATININE 1.20*  CALCIUM 8.8*   ------------------------------------------------------------------------------------------------------------------  Cardiac Enzymes Recent Labs  Lab 09/23/17 1645  TROPONINI <0.03   ------------------------------------------------------------------------------------------------------------------  RADIOLOGY:  Dg Chest 2 View  Result Date: 09/23/2017 CLINICAL DATA:  Shortness of breath EXAM: CHEST - 2 VIEW COMPARISON:  08/25/2017 FINDINGS: Cardiac shadow is enlarged. Defibrillator is noted and stable. The lungs are well aerated bilaterally. Mild chronic interstitial changes are seen. Stable blunting of the costophrenic angles is noted. Chronic changes in the thoracic spine are noted. IMPRESSION: Chronic changes without acute abnormality. Electronically Signed   By: 10/25/2017 M.D.   On: 09/23/2017 17:33    ASSESSMENT AND PLAN:   Principal Problem:   Acute on chronic combined systolic and diastolic CHF (congestive heart failure) (HCC) Active Problems:   Atrial fibrillation, chronic (HCC)   Acquired hypothyroidism   GERD (gastroesophageal reflux disease)   HTN (hypertension)  *  Acute on chronic combined systolic and diastolic CHF (congestive heart  failure) (HCC) -IV diuresis given ,with good results,  cardiology consult and echocardiogram   *  Atrial fibrillation, chronic (HCC) -continue home meds *  HTN (hypertension) -home dose antihypertensives *  Acquired hypothyroidism -home dose thyroid replacement *  GERD (gastroesophageal reflux disease) -continue home meds   All the records are reviewed and case discussed with Care Management/Social Workerr. Management plans discussed with the patient, family and they are in agreement.  CODE STATUS: DNR  TOTAL TIME TAKING CARE OF THIS PATIENT: 35 minutes.     POSSIBLE D/C IN 1-2 DAYS, DEPENDING ON CLINICAL CONDITION.   Altamese Dilling M.D on 09/24/2017   Between 7am to 6pm - Pager - [phone]  After 6pm go to [email or web address] - password [email or web address]  [facility]  Office  [phone]  CC: Primary care physician; Patrice Paradise, MD  Note: This dictation was prepared with Dragon dictation along with smaller phrase technology. Any transcriptional errors that result from this process are unintentional.

## 2017-09-25 LAB — ECHOCARDIOGRAM COMPLETE
HEIGHTINCHES: 64 in
WEIGHTICAEL: 2240 [oz_av]

## 2017-09-25 LAB — BASIC METABOLIC PANEL
ANION GAP: 7 (ref 5–15)
BUN: 21 mg/dL — ABNORMAL HIGH (ref 6–20)
CHLORIDE: 94 mmol/L — AB (ref 101–111)
CO2: 32 mmol/L (ref 22–32)
CREATININE: 1.26 mg/dL — AB (ref 0.44–1.00)
Calcium: 8.6 mg/dL — ABNORMAL LOW (ref 8.9–10.3)
GFR calc non Af Amer: 37 mL/min — ABNORMAL LOW (ref >60)
GFR, EST AFRICAN AMERICAN: 43 mL/min — AB (ref >60)
Glucose, Bld: 103 mg/dL — ABNORMAL HIGH (ref 65–99)
POTASSIUM: 4.6 mmol/L (ref 3.5–5.1)
SODIUM: 133 mmol/L — AB (ref 135–145)

## 2017-09-25 MED ORDER — IPRATROPIUM-ALBUTEROL 0.5-2.5 (3) MG/3ML IN SOLN
3.0000 mL | Freq: Four times a day (QID) | RESPIRATORY_TRACT | Status: DC | PRN
  Administered 2017-09-25: 3 mL via RESPIRATORY_TRACT
  Filled 2017-09-25: qty 3

## 2017-09-25 MED ORDER — IPRATROPIUM-ALBUTEROL 0.5-2.5 (3) MG/3ML IN SOLN: 3.0000 mL | Freq: Four times a day (QID) | RESPIRATORY_TRACT | Status: DC

## 2017-09-25 MED ORDER — CARVEDILOL 6.25 MG PO TABS
6.2500 mg | ORAL_TABLET | Freq: Two times a day (BID) | ORAL | Status: DC
  Administered 2017-09-26: 6.25 mg via ORAL
  Filled 2017-09-25: qty 1

## 2017-09-25 NOTE — Plan of Care (Signed)
  Problem: Pain Managment: Goal: General experience of comfort will improve Outcome: Progressing   Problem: Safety: Goal: Ability to remain free from injury will improve Outcome: Progressing   Problem: Activity: Goal: Capacity to carry out activities will improve Outcome: Progressing   

## 2017-09-25 NOTE — Progress Notes (Deleted)
[facility] Cardiology [facility] Encounter Note  Patient: Joanna Roberts / Admit Date: 09/23/2017 / Date of Encounter: 09/25/2017, 8:37 AM   Subjective: Patient does have some significant urine output and slight improvements of lower extremity edema as well as shortness of breath.  Patient has not had any concerns with cough or congestion.  Troponin is normal without evidence of myocardial infarction.  Patient does have telemetry showing dual-chamber pacemaker.  Echocardiogram pending  Review of Systems: Positive for: As of breath and lower extremity edema Negative for: Vision change, hearing change, syncope, dizziness, nausea, vomiting,diarrhea, bloody stool, stomach pain, cough, congestion, diaphoresis, urinary frequency, urinary pain,skin lesions, skin rashes Others previously listed  Objective: Telemetry: Paced rhythm Physical Exam: Blood pressure 93/76, pulse 71, temperature 98 F (36.7 C), temperature source Oral, resp. rate 18, height 5\' 4"  (1.626 m), weight 141 lb 4.8 oz (64.1 kg), SpO2 96 %. Body mass index is 24.25 kg/m. General: Well developed, well nourished, in no acute distress. Head: Normocephalic, atraumatic, sclera non-icteric, no xanthomas, nares are without discharge. Neck: No apparent masses Lungs: Normal respirations with no wheezes, no rhonchi, no rales few basilar crackles   Heart: Regular rate and rhythm, normal S1 S2, 2 to 3+ aortic murmur, no rub, no gallop, PMI is normal size and placement, carotid upstroke normal with bruit, jugular venous pressure normal Abdomen: Soft, non-tender, non-distended with normoactive bowel sounds. No hepatosplenomegaly. Abdominal aorta is normal size without bruit Extremities 2+ edema, no clubbing, no cyanosis, no ulcers,  Peripheral: 2+ radial, 2+ femoral, 1 + dorsal pedal pulses Neuro: Alert and oriented. Moves all extremities spontaneously. Psych:  Responds to questions appropriately with a normal affect.   Intake/Output Summary  (Last 24 hours) at 09/25/2017 0837 Last data filed at 09/25/2017 0636 Gross per 24 hour  Intake 360 ml  Output 1400 ml  Net -1040 ml    Inpatient Medications:  . aspirin EC  81 mg Oral Daily  . carvedilol  25 mg Oral BID  . enoxaparin (LOVENOX) injection  30 mg Subcutaneous Q24H  . famotidine  20 mg Oral Daily  . furosemide  20 mg Intravenous BID AC  . gabapentin  300 mg Oral BID  . levothyroxine  25 mcg Oral QAC breakfast  . losartan  25 mg Oral Daily  . mouth rinse  15 mL Mouth Rinse BID  . pantoprazole  40 mg Oral Daily  . spironolactone  12.5 mg Oral Daily   Infusions:   Labs: Recent Labs    09/24/17 0535 09/25/17 0650  NA 134* 133*  K 4.1 4.6  CL 95* 94*  CO2 30 32  GLUCOSE 119* 103*  BUN 24* 21*  CREATININE 1.20* 1.26*  CALCIUM 8.8* 8.6*   No results for input(s): AST, ALT, ALKPHOS, BILITOT, PROT, ALBUMIN in the last 72 hours. Recent Labs    09/23/17 1645 09/24/17 0535  WBC 3.8 4.2  HGB 11.2* 11.1*  HCT 35.1 34.4*  MCV 98.8 97.3  PLT 137* 124*   Recent Labs    09/23/17 1645  TROPONINI <0.03   Invalid input(s): POCBNP No results for input(s): HGBA1C in the last 72 hours.   Weights: Filed Weights   09/23/17 1643 09/25/17 0319  Weight: 140 lb (63.5 kg) 141 lb 4.8 oz (64.1 kg)     Radiology/Studies:  Dg Chest 2 View  Result Date: 09/23/2017 CLINICAL DATA:  Shortness of breath EXAM: CHEST - 2 VIEW COMPARISON:  08/25/2017 FINDINGS: Cardiac shadow is enlarged. Defibrillator is noted and stable.  The lungs are well aerated bilaterally. Mild chronic interstitial changes are seen. Stable blunting of the costophrenic angles is noted. Chronic changes in the thoracic spine are noted. IMPRESSION: Chronic changes without acute abnormality. Electronically Signed   By: Alcide Clever M.D.   On: 09/23/2017 17:33     Assessment and Recommendation  84 y.o. female with acute on chronic combined systolic diastolic dysfunction congestive heart failure with elevated BNP  aortic valve disease sick sinus syndrome status post pacemaker placement with severe lower extremity edema slightly improved at this time with furosemide and compression without evidence of myocardial infarction 1.  Continue intravenous Lasix for lower extremity edema 2.  Continue compression and elevation and other measures for lower extremity edema 3.  No further cardiac diagnostics necessary at this time 4.  Carvedilol for cardiomyopathy and LV systolic dysfunction but no use of ACE inhibitor at this time due to significant chronic kidney disease 5.  If ambulating well and feeling improved possible discharged home with follow-up next week for further adjustments of medication  Signed, Arnoldo Hooker M.D. FACC

## 2017-09-25 NOTE — Progress Notes (Signed)
[facility] Cardiology [facility] Encounter Note  Patient: Joanna Roberts / Admit Date: 09/23/2017 / Date of Encounter: 09/25/2017, 8:42 AM   Subjective: Patient has improved lower extremity edema.  Continued shortness of breath weakness and fatigue.  No evidence of myocardial infarction.  Patient tolerating intravenous Lasix.  Review of Systems: Positive for: Her extremity edema shortness of breath Negative for: Vision change, hearing change, syncope, dizziness, nausea, vomiting,diarrhea, bloody stool, stomach pain, cough, congestion, diaphoresis, urinary frequency, urinary pain,skin lesions, skin rashes Others previously listed  Objective: Telemetry: East rhythm Physical Exam: Blood pressure 93/76, pulse 71, temperature 98 F (36.7 C), temperature source Oral, resp. rate 18, height 5\' 4"  (1.626 m), weight 141 lb 4.8 oz (64.1 kg), SpO2 96 %. Body mass index is 24.25 kg/m. General: Well developed, well nourished, in no acute distress. Head: Normocephalic, atraumatic, sclera non-icteric, no xanthomas, nares are without discharge. Neck: No apparent masses Lungs: Normal respirations with no wheezes, no rhonchi, no rales , few basilar crackles   Heart: Regular rate and rhythm, normal S1 S2, 2-3+ aortic murmur, no rub, no gallop, PMI is normal size and placement, carotid upstroke normal without bruit, jugular venous pressure normal Abdomen: Soft, non-tender, non-distended with normoactive bowel sounds. No hepatosplenomegaly. Abdominal aorta is normal size without bruit Extremities: 2+ edema, no clubbing, no cyanosis, positive ulcers,  Peripheral: 2+ radial, 2+ femoral, 1 + dorsal pedal pulses Neuro: Alert and oriented. Moves all extremities spontaneously. Psych:  Responds to questions appropriately with a normal affect.   Intake/Output Summary (Last 24 hours) at 09/25/2017 0842 Last data filed at 09/25/2017 0636 Gross per 24 hour  Intake 360 ml  Output 1400 ml  Net -1040 ml    Inpatient  Medications:  . aspirin EC  81 mg Oral Daily  . carvedilol  25 mg Oral BID  . enoxaparin (LOVENOX) injection  30 mg Subcutaneous Q24H  . famotidine  20 mg Oral Daily  . furosemide  20 mg Intravenous BID AC  . gabapentin  300 mg Oral BID  . levothyroxine  25 mcg Oral QAC breakfast  . losartan  25 mg Oral Daily  . mouth rinse  15 mL Mouth Rinse BID  . pantoprazole  40 mg Oral Daily  . spironolactone  12.5 mg Oral Daily   Infusions:   Labs: Recent Labs    09/24/17 0535 09/25/17 0650  NA 134* 133*  K 4.1 4.6  CL 95* 94*  CO2 30 32  GLUCOSE 119* 103*  BUN 24* 21*  CREATININE 1.20* 1.26*  CALCIUM 8.8* 8.6*   No results for input(s): AST, ALT, ALKPHOS, BILITOT, PROT, ALBUMIN in the last 72 hours. Recent Labs    09/23/17 1645 09/24/17 0535  WBC 3.8 4.2  HGB 11.2* 11.1*  HCT 35.1 34.4*  MCV 98.8 97.3  PLT 137* 124*   Recent Labs    09/23/17 1645  TROPONINI <0.03   Invalid input(s): POCBNP No results for input(s): HGBA1C in the last 72 hours.   Weights: Filed Weights   09/23/17 1643 09/25/17 0319  Weight: 140 lb (63.5 kg) 141 lb 4.8 oz (64.1 kg)     Radiology/Studies:  Dg Chest 2 View  Result Date: 09/23/2017 CLINICAL DATA:  Shortness of breath EXAM: CHEST - 2 VIEW COMPARISON:  08/25/2017 FINDINGS: Cardiac shadow is enlarged. Defibrillator is noted and stable. The lungs are well aerated bilaterally. Mild chronic interstitial changes are seen. Stable blunting of the costophrenic angles is noted. Chronic changes in the thoracic spine are noted.  IMPRESSION: Chronic changes without acute abnormality. Electronically Signed   By: Alcide Clever M.D.   On: 09/23/2017 17:33     Assessment and Recommendation  84 y.o. female with known acute on chronic combined systolic diastolic dysfunction congestive heart failure with aortic valve stenosis lower extremity edema without evidence of myocardial infarction 1.  Continue Lasix for lower extremity edema and pulmonary edema 2.   Continue compression hose and elevation for lower extremity edema 3.  Losartan and carvedilol for cardiomyopathy 4.  Begin ambulation and follow for improvements of symptoms 5.  No further cardiac diagnostics necessary 6.  Okay for discharge home from cardiac standpoint with follow-up next week for adjustments of medications  Signed, Arnoldo Hooker M.D. FACC

## 2017-09-25 NOTE — Progress Notes (Signed)
BP 86/55 needs some lasix at this time. Dr. Elisabeth Pigeon notified stated to hold for now and recheck bp in a couple hours and give evening dose of lasix.

## 2017-09-25 NOTE — Progress Notes (Signed)
Nurse had noted a significant difference between 2 arm BP. Pt had significant vascular issues, had procedures in past.  We may discuss this finding with Dr. Gilda Crease on phone and let her follow in clinic.

## 2017-09-25 NOTE — Evaluation (Signed)
Physical Therapy Evaluation Patient Details Name: Joanna Roberts MRN: [id number] DOB: [birth year] Today's Date: 09/25/2017   History of Present Illness  84 yo female was admitted with CHF, SOB and LE edema with PMHx: defibrillator, pacemaker, MI, CHF, TIA, heart murmur  Clinical Impression  Pt was seen for evaluation of mobility with notable issues of low BP and some light headedness as a result.  Sitting BP was 78/56, then after mm testing was 80/65, and standing was 72/56.  Deferred gait as a result, and talked with pt about the reasons for continuing rehab as well as goals.  Follow acutely for strength and control of balance with close monitoring of BP to prevent LOB.  Nursing aware of the issues, and are following her for same.  Pt returned to bed with PT assistance at end of session as she has been up in the chair all afternoon.    Follow Up Recommendations SNF    Equipment Recommendations  None recommended by PT    Recommendations for Other Services       Precautions / Restrictions Precautions Precautions: Fall(telemetry) Restrictions Weight Bearing Restrictions: No      Mobility  Bed Mobility Overal bed mobility: Needs Assistance Bed Mobility: Sit to Supine       Sit to supine: Mod assist   General bed mobility comments: dense cues and asssitance to LE's  Transfers Overall transfer level: Needs assistance Equipment used: Rolling walker (2 wheeled);1 person hand held assist Transfers: Sit to/from Stand Sit to Stand: Min assist         General transfer comment: pt needs reminders for hand placement and close guard due to low BP readings even sitting  Ambulation/Gait             General Gait Details: deferred due to BP  Stairs            Wheelchair Mobility    Modified Rankin (Stroke Patients Only)       Balance Overall balance assessment: Needs assistance Sitting-balance support: Bilateral upper extremity supported;Feet supported Sitting  balance-Leahy Scale: Good     Standing balance support: Bilateral upper extremity supported;During functional activity Standing balance-Leahy Scale: Fair Standing balance comment: less than fair dynamic standing                             Pertinent Vitals/Pain Pain Assessment: No/denies pain    Home Living Family/patient expects to be discharged to:: Assisted living Living Arrangements: Other (Comment)(Staff of ALF)             Home Equipment: Walker - 2 wheels;Cane - single point      Prior Function Level of Independence: Independent with assistive device(s)               Hand Dominance   Dominant Hand: Right    Extremity/Trunk Assessment   Upper Extremity Assessment Upper Extremity Assessment: Overall WFL for tasks assessed    Lower Extremity Assessment Lower Extremity Assessment: Generalized weakness    Cervical / Trunk Assessment Cervical / Trunk Assessment: Kyphotic  Communication   Communication: No difficulties  Cognition Arousal/Alertness: Awake/alert Behavior During Therapy: WFL for tasks assessed/performed Overall Cognitive Status: Within Functional Limits for tasks assessed                                        General Comments  Exercises     Assessment/Plan    PT Assessment Patient needs continued PT services  PT Problem List Decreased strength;Decreased range of motion;Decreased activity tolerance;Decreased balance;Decreased mobility;Decreased coordination;Decreased knowledge of use of DME;Decreased safety awareness;Cardiopulmonary status limiting activity       PT Treatment Interventions DME instruction;Gait training;Functional mobility training;Therapeutic activities;Therapeutic exercise;Balance training;Neuromuscular re-education;Patient/family education    PT Goals (Current goals can be found in the Care Plan section)  Acute Rehab PT Goals Patient Stated Goal: to get her BP under control PT  Goal Formulation: With patient Time For Goal Achievement: 10/09/17 Potential to Achieve Goals: Good    Frequency Min 2X/week   Barriers to discharge Decreased caregiver support home with limited assistance    Co-evaluation               AM-PAC PT "6 Clicks" Daily Activity  Outcome Measure Difficulty turning over in bed (including adjusting bedclothes, sheets and blankets)?: A Little Difficulty moving from lying on back to sitting on the side of the bed? : Unable Difficulty sitting down on and standing up from a chair with arms (e.g., wheelchair, bedside commode, etc,.)?: Unable Help needed moving to and from a bed to chair (including a wheelchair)?: A Little Help needed walking in hospital room?: A Lot Help needed climbing 3-5 steps with a railing? : Total 6 Click Score: 11    End of Session Equipment Utilized During Treatment: Gait belt Activity Tolerance: Patient tolerated treatment well;Treatment limited secondary to medical complications (Comment)(BP esd 80/65 sitting and 72/56 standing) Patient left: in bed;with call bell/phone within reach;with bed alarm set Nurse Communication: Mobility status PT Visit Diagnosis: Unsteadiness on feet (R26.81);Other abnormalities of gait and mobility (R26.89);Muscle weakness (generalized) (M62.81);Difficulty in walking, not elsewhere classified (R26.2);Dizziness and giddiness (R42)    Time: 2951-8841 PT Time Calculation (min) (ACUTE ONLY): 28 min   Charges:   PT Evaluation $PT Eval Moderate Complexity: 1 Mod PT Treatments $Therapeutic Activity: 8-22 mins   PT G Codes:   PT G-Codes **NOT FOR INPATIENT CLASS** Functional Assessment Tool Used: AM-PAC 6 Clicks Basic Mobility    Ivar Drape 09/25/2017, 9:04 PM   Samul Dada, PT MS Acute Rehab Dept. Number: [facility] [id number] and [facility] [phone]

## 2017-09-25 NOTE — Progress Notes (Signed)
[employer] [facility] [place] [facility]   PATIENT NAME: Joanna Roberts    MR#:  [id number]  DATE OF BIRTH:  [birth year]  SUBJECTIVE:  CHIEF COMPLAINT:   Chief Complaint  Patient presents with  . Shortness of Breath    Feels SOB, have CHF.  have some hypotension today.  REVIEW OF SYSTEMS:  CONSTITUTIONAL: No fever, fatigue or weakness.  EYES: No blurred or double vision.  EARS, NOSE, AND THROAT: No tinnitus or ear pain.  RESPIRATORY: No cough,have shortness of breath,no wheezing or hemoptysis.  CARDIOVASCULAR: No chest pain, orthopnea, edema.  GASTROINTESTINAL: No nausea, vomiting, diarrhea or abdominal pain.  GENITOURINARY: No dysuria, hematuria.  ENDOCRINE: No polyuria, nocturia,  HEMATOLOGY: No anemia, easy bruising or bleeding SKIN: No rash or lesion. MUSCULOSKELETAL: No joint pain or arthritis.   NEUROLOGIC: No tingling, numbness, weakness.  PSYCHIATRY: No anxiety or depression.   ROS  DRUG ALLERGIES:   Allergies  Allergen Reactions  . Lortab [Hydrocodone-Acetaminophen] Itching, Swelling and Rash  . Doxycycline Diarrhea  . Macrobid [Nitrofurantoin Monohyd Macro] Diarrhea and Nausea And Vomiting  . Nitrofurantoin Diarrhea, Nausea Only and Nausea And Vomiting    VITALS:  Blood pressure (!) 86/55, pulse 69, temperature 98 F (36.7 C), temperature source Oral, resp. rate 18, height 5\' 4"  (1.626 m), weight 64.1 kg (141 lb 4.8 oz), SpO2 96 %.  PHYSICAL EXAMINATION:  GENERAL:  84 y.o.-year-old patient lying in the bed with no acute distress.  EYES: Pupils equal, round, reactive to light and accommodation. No scleral icterus. Extraocular muscles intact.  HEENT: Head atraumatic, normocephalic. Oropharynx and nasopharynx clear.  NECK:  Supple, no jugular venous distention. No thyroid enlargement, no tenderness.  LUNGS: Normal breath sounds bilaterally, no wheezing, have crepitation. No use of accessory muscles of respiration.  CARDIOVASCULAR: S1, S2 normal. No  murmurs, rubs, or gallops.  ABDOMEN: Soft, nontender, nondistended. Bowel sounds present. No organomegaly or mass.  EXTREMITIES: have pedal edema,no cyanosis, or clubbing.  NEUROLOGIC: Cranial nerves II through XII are intact. Muscle strength 4/5 in all extremities. Sensation intact. Gait not checked.  PSYCHIATRIC: The patient is alert and oriented x 3.  SKIN: No obvious rash, lesion, or ulcer.   Physical Exam LABORATORY PANEL:   CBC Recent Labs  Lab 09/24/17 0535  WBC 4.2  HGB 11.1*  HCT 34.4*  PLT 124*   ------------------------------------------------------------------------------------------------------------------  Chemistries  Recent Labs  Lab 09/25/17 0650  NA 133*  K 4.6  CL 94*  CO2 32  GLUCOSE 103*  BUN 21*  CREATININE 1.26*  CALCIUM 8.6*   ------------------------------------------------------------------------------------------------------------------  Cardiac Enzymes Recent Labs  Lab 09/23/17 1645  TROPONINI <0.03   ------------------------------------------------------------------------------------------------------------------  RADIOLOGY:  Dg Chest 2 View  Result Date: 09/23/2017 CLINICAL DATA:  Shortness of breath EXAM: CHEST - 2 VIEW COMPARISON:  08/25/2017 FINDINGS: Cardiac shadow is enlarged. Defibrillator is noted and stable. The lungs are well aerated bilaterally. Mild chronic interstitial changes are seen. Stable blunting of the costophrenic angles is noted. Chronic changes in the thoracic spine are noted. IMPRESSION: Chronic changes without acute abnormality. Electronically Signed   By: 10/25/2017 M.D.   On: 09/23/2017 17:33    ASSESSMENT AND PLAN:   Principal Problem:   Acute on chronic combined systolic and diastolic CHF (congestive heart failure) (HCC) Active Problems:   Atrial fibrillation, chronic (HCC)   Acquired hypothyroidism   GERD (gastroesophageal reflux disease)   HTN (hypertension)  *  Acute on chronic combined systolic  and diastolic CHF (congestive heart failure) (  HCC) -IV diuresis given ,with good results,   cardiology consult and echocardiogram    EF had dropped from 40 % in jan 2019 - to 25% now.   Try to add losartan and spironolactone, but pt's BP dropepd down, so cutdown dose of coreg and try to give lasix.  *  HTN (hypertension) - decreased coreg, started on losartan but BP dropped. *  Acquired hypothyroidism -home dose thyroid replacement *  GERD (gastroesophageal reflux disease) -continue home meds * CKD stage 3- monitor. * Chronic edema on legs, goes to wound care center for [facility] boots, call consult here.  All the records are reviewed and case discussed with Care Management/Social Workerr. Management plans discussed with the patient, family and they are in agreement.  CODE STATUS: DNR  TOTAL TIME TAKING CARE OF THIS PATIENT: 82 minutes.     POSSIBLE D/C IN 1-2 DAYS, DEPENDING ON CLINICAL CONDITION.   Altamese Dilling M.D on 09/25/2017   Between 7am to 6pm - Pager - [phone]  After 6pm go to [email or web address] - password [email or web address]  [facility]  Office  [phone]  CC: Primary care physician; Patrice Paradise, MD  Note: This dictation was prepared with Dragon dictation along with smaller phrase technology. Any transcriptional errors that result from this process are unintentional.

## 2017-09-26 ENCOUNTER — Other Ambulatory Visit: Payer: Self-pay | Admitting: *Deleted

## 2017-09-26 LAB — BASIC METABOLIC PANEL
ANION GAP: 7 (ref 5–15)
BUN: 20 mg/dL (ref 6–20)
CALCIUM: 8.4 mg/dL — AB (ref 8.9–10.3)
CO2: 32 mmol/L (ref 22–32)
Chloride: 94 mmol/L — ABNORMAL LOW (ref 101–111)
Creatinine, Ser: 1.04 mg/dL — ABNORMAL HIGH (ref 0.44–1.00)
GFR calc non Af Amer: 47 mL/min — ABNORMAL LOW (ref >60)
GFR, EST AFRICAN AMERICAN: 55 mL/min — AB (ref >60)
Glucose, Bld: 87 mg/dL (ref 65–99)
Potassium: 4.7 mmol/L (ref 3.5–5.1)
Sodium: 133 mmol/L — ABNORMAL LOW (ref 135–145)

## 2017-09-26 MED ORDER — CARVEDILOL 6.25 MG PO TABS: 6.2500 mg | ORAL_TABLET | Freq: Two times a day (BID) | ORAL | 0 refills | Status: AC

## 2017-09-26 MED ORDER — ENOXAPARIN SODIUM 40 MG/0.4ML ~~LOC~~ SOLN: 40.0000 mg | SUBCUTANEOUS | Status: DC

## 2017-09-26 MED ORDER — GABAPENTIN 300 MG PO CAPS: 300.0000 mg | ORAL_CAPSULE | Freq: Two times a day (BID) | ORAL | Status: AC

## 2017-09-26 MED ORDER — FUROSEMIDE 20 MG PO TABS: 40.0000 mg | ORAL_TABLET | Freq: Every day | ORAL | 0 refills | Status: AC

## 2017-09-26 NOTE — Consult Note (Signed)
WOC Nurse wound consult note Reason for Consult: Lower extremity edema and CHF.  Patient refuses layered compression today and states that she wears removable compression that she can don and doff independently.  She is discharging today and will apply this when she gets home.  We discussed low sodium diet for CHF management.  She does not like the food at her independent living facility.  She eats a lot of frozen meals and we discussed limiting salt intake.  She indicates she will be aware of this going forward.  Wound type:No open wounds.  Legs intact with generalized edema Pressure Injury POA: NA Measurement:n/a Wound bed:n/a Drainage (amount, consistency, odor) none.  Weeps at times due to edema  Dressing procedure/placement/frequency: removable compression to be applied once she is home today.  Will not follow at this time.  Please re-consult if needed.  Maple Hudson RN BSN CWON Pager [phone]

## 2017-09-26 NOTE — Consult Note (Addendum)
   [facility] CM Inpatient Consult   09/26/2017  ERNEST ORR [birth year] [id number]    Made aware of hospitalization by Telephonic RNCM plus received referral from inpatient RNCM for [facility] Care Management program.  Spoke with Mrs. Abbett to discuss [facility] Care Management program. She is agreeable and verbal consent obtained for [facility] Care Management services.  Mrs. Piloto endorses she lives alone in a retirement community.  States she sometimes has trouble with transportation on Mondays,"when the bus doesn't run". Confirms Primary Care MD is at [facility]. Dr. Merlinda Frederick (not listed as doing transition of care). Denies having any concerns with obtaining medications.  Agreeable to [facility] Telephonic RN Care Coordinator for CHF and transition of care and [facility] Social Worker follow up for transportation.  Expresses appreciation of the call.  Referral made for [facility] Telephonic RN Care Coordinator and [facility] Social Worker.  Notification sent to inpatient RNCM  to make aware [facility] will follow.    Raiford Noble, MSN-Ed, RN,BSN [facility] Liaison [phone]

## 2017-09-26 NOTE — [place] FL2 (Signed)
[place] MEDICAID FL2 LEVEL OF CARE SCREENING TOOL     IDENTIFICATION  Patient Name: Joanna Roberts Birthdate: [birth year] Sex: female Admission Date (Current Location): 09/23/2017  [place] and [place] Number:  Chiropodist and Address:  [facility], [address] [place], [place] [address]      Provider Number: [id number]  Attending Physician Name and Address:  Adrian Saran, MD  Relative Name and Phone NumberShaketha, Jeon Daughter [phone]  [phone]     Current Level of Care: Hospital Recommended Level of Care: Skilled Nursing Facility Prior Approval Number:    Date Approved/Denied:   PASRR Number: [id number]  Discharge Plan: SNF    Current Diagnoses: Patient Active Problem List   Diagnosis Date Noted  . Acute on chronic combined systolic and diastolic CHF (congestive heart failure) (HCC) 09/23/2017  . HTN (hypertension) 09/23/2017  . Ankle ulcer (HCC) 01/31/2017  . Carotid stenosis 01/31/2017  . Atherosclerosis of native arteries of the extremities with ulceration (HCC) 01/02/2017  . Varicose veins of both lower extremities 09/30/2016  . Lymphedema 07/29/2016  . Protein-calorie malnutrition, severe 06/03/2016  . Sepsis (HCC) 06/01/2016  . Acute blood loss anemia 04/01/2016  . Hematoma of leg, right, initial encounter 03/23/2016  . Chronic mesenteric ischemia (HCC) 03/15/2016  . PAD (peripheral artery disease) (HCC) 02/12/2016  . DDD (degenerative disc disease), lumbar 11/18/2015  . Facet syndrome, lumbar 11/18/2015  . Sacroiliac joint dysfunction 11/18/2015  . Spinal stenosis, lumbar region, with neurogenic claudication 11/18/2015  . Lumbar radiculopathy 11/18/2015  . Compression fracture of thoracic spine, non-traumatic (HCC) 11/18/2015  . Idiopathic scoliosis 11/18/2015  . H/O varicose vein stripping 11/18/2015  . Acquired hypothyroidism 09/17/2015  . Cardiomyopathy (HCC) 09/17/2015  . Back pain, chronic  09/17/2015  . DD (diverticular disease) 09/17/2015  . Aggrieved 09/17/2015  . 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 History of colon polyps 09/17/2015  . Personal history of urinary infection 09/17/2015  . Cannot sleep 09/17/2015  . GERD (gastroesophageal reflux disease) 09/17/2015  . Tachycardia-bradycardia (HCC) 09/17/2015  . Atrial fibrillation, chronic (HCC) 03/18/2015  . Pneumonia 03/11/2015  . B12 deficiency 11/27/2014  . Chronic anemia 11/26/2014  . Incomplete bladder emptying 06/24/2014  . Frequent UTI 06/24/2014  . History of urinary anomaly 06/21/2014  . FOM (frequency of micturition) 06/21/2014  . Chronic abdominal pain 01/17/2014  . CN (constipation) 01/17/2014  . Fatty infiltration of liver 01/17/2014  . Pain in rib 11/06/2013  . Temporary cerebral vascular dysfunction 11/06/2013    Orientation RESPIRATION BLADDER Height & Weight     Self, Time, Situation, Place  O2(2L) Continent Weight: 139 lb 1.8 oz (63.1 kg) Height:  5\' 4"  (162.6 cm)  BEHAVIORAL SYMPTOMS/MOOD NEUROLOGICAL BOWEL NUTRITION STATUS      Continent Diet  AMBULATORY STATUS COMMUNICATION OF NEEDS Skin   Limited Assist Verbally Normal                       Personal Care Assistance Level of Assistance  Bathing, Feeding, Dressing Bathing Assistance: Limited assistance Feeding assistance: Independent Dressing Assistance: Limited assistance     Functional Limitations Info  Sight, Hearing, Speech Sight Info: Adequate Hearing Info: Adequate Speech Info: Adequate    SPECIAL CARE FACTORS FREQUENCY  PT (By licensed PT)     PT Frequency: 5x a week              Contractures Contractures Info: Not present    Additional Factors Info  Allergies, Code Status Code  Status Info: DNR Allergies Info: LORTAB HYDROCODONE-ACETAMINOPHEN, DOXYCYCLINE, MACROBID NITROFURANTOIN MONOHYD MACRO, NITROFURANTOIN            Current Medications (09/26/2017):  This is the current hospital active medication  list Current Facility-Administered Medications  Medication Dose Route Frequency Provider Last Rate Last Dose  . acetaminophen (TYLENOL) tablet 650 mg  650 mg Oral Q6H PRN Oralia Manis, MD   650 mg at 09/24/17 0144   Or  . acetaminophen (TYLENOL) suppository 650 mg  650 mg Rectal Q6H PRN Oralia Manis, MD      . aspirin EC tablet 81 mg  81 mg Oral Daily Oralia Manis, MD   81 mg at 09/24/17 1044  . carvedilol (COREG) tablet 6.25 mg  6.25 mg Oral BID Altamese Dilling, MD   6.25 mg at 09/26/17 1008  . [START ON 09/27/2017] enoxaparin (LOVENOX) injection 40 mg  40 mg Subcutaneous Q24H Mody, Sital, MD      . famotidine (PEPCID) tablet 20 mg  20 mg Oral Daily Oralia Manis, MD   20 mg at 09/26/17 1008  . furosemide (LASIX) injection 20 mg  20 mg Intravenous BID AC Houston Siren, MD   20 mg at 09/26/17 0845  . gabapentin (NEURONTIN) capsule 300 mg  300 mg Oral BID Oralia Manis, MD   300 mg at 09/26/17 1008  . ipratropium-albuterol (DUONEB) 0.5-2.5 (3) MG/3ML nebulizer solution 3 mL  3 mL Nebulization Q6H PRN Altamese Dilling, MD   3 mL at 09/25/17 1410  . levothyroxine (SYNTHROID, LEVOTHROID) tablet 25 mcg  25 mcg Oral QAC breakfast Oralia Manis, MD   25 mcg at 09/26/17 0845  . MEDLINE mouth rinse  15 mL Mouth Rinse BID Oralia Manis, MD   15 mL at 09/24/17 2240  . ondansetron (ZOFRAN) tablet 4 mg  4 mg Oral Q6H PRN Oralia Manis, MD       Or  . ondansetron [facility]) injection 4 mg  4 mg Intravenous Q6H PRN Oralia Manis, MD      . oxyCODONE-acetaminophen (PERCOCET/ROXICET) 5-325 MG per tablet 1 tablet  1 tablet Oral BID PRN Oralia Manis, MD   1 tablet at 09/25/17 2241  . pantoprazole (PROTONIX) EC tablet 40 mg  40 mg Oral Daily Oralia Manis, MD   40 mg at 09/26/17 1008  . traZODone (DESYREL) tablet 25 mg  25 mg Oral QHS PRN Oralia Manis, MD   25 mg at 09/25/17 2133     Discharge Medications: Please see discharge summary for a list of discharge medications.  Relevant Imaging  Results:  Relevant Lab Results:   Additional Information SSN [id number]  Darleene Cleaver, [place]

## 2017-09-26 NOTE — Care Management (Signed)
Accepted [facility] referral

## 2017-09-26 NOTE — Clinical Social Work Note (Addendum)
CSW spoke with patient to discuss SNF recommendation.  Patient states she will not go to rehab, and she will just work on taking better care of herself at home and continuing to take car of her cat.  CSW discussed with her the benefits of going to SNF verse going home with home health.  Patient stated that she uses oxygen at home, and is used to it, patient also stated that she has a friend who does grocery shopping for her as well as chores around the home.  Patient still refused to go to SNF, she stated she will agree to home health, CSW notified case manager, CSW to sign off please reconsult if other social work needs arise.  Ervin Knack. Charmel Pronovost, MSW, Theresia Majors [phone]  09/26/2017 12:06 PM

## 2017-09-26 NOTE — Care Management Important Message (Signed)
Copy of signed IM left with patient in room.  

## 2017-09-26 NOTE — Discharge Summary (Addendum)
[facility] [place] [facility]   PATIENT NAME: Joanna Roberts    MR#:  [id number]  DATE OF BIRTH:  [birth year]  DATE OF ADMISSION:  09/23/2017 ADMITTING PHYSICIAN: Oralia Manis, MD  DATE OF DISCHARGE: 09/26/2017  PRIMARY CARE PHYSICIAN: Patrice Paradise, MD    ADMISSION DIAGNOSIS:  Acute respiratory failure with hypoxia (HCC) [J96.01] Acute on chronic congestive heart failure, unspecified heart failure type (HCC) [I50.9]  DISCHARGE DIAGNOSIS:  Principal Problem:   Acute on chronic combined systolic and diastolic CHF (congestive heart failure) (HCC) Active Problems:   Atrial fibrillation, chronic (HCC)   Acquired hypothyroidism   GERD (gastroesophageal reflux disease)   HTN (hypertension)   SECONDARY DIAGNOSIS:   Past Medical History:  Diagnosis Date  . AICD (automatic cardioverter/defibrillator) present   . Allergy   . Anemia   . Arthritis   . Cataract   . CHF (congestive heart failure) (HCC)   . Depression   . Dysrhythmia   . GERD (gastroesophageal reflux disease)   . Heart murmur   . History of TIAs   . Hypertension   . Hypothyroidism   . Myocardial infarction (HCC)   . Osteoporosis   . Presence of permanent cardiac pacemaker   . Thyroid disease     HOSPITAL COURSE:  84 year old female with a history of combined systolic and diastolic heart failure who presents with shortness of breath.  1.  Acute on chronic hypoxic respiratory failure in the setting of CHF exacerbation: Patient has been weaned to baseline oxygen  2.  Acute on chronic combined systolic and diastolic heart failure with ejection fraction of 25 to 30%: Patient was evaluated by cardiology.  Patient was diuresed with IV Lasix.  Patient's shortness of breath is improved.  She has chronic lower extremity edema.  She is referred to CHF clinic upon discharge.  She will be discharged on oral Lasix.  She needs to monitor her weight daily.  She needs close follow-up with cardiology as  well. ARB/Aldactone was attempted to be started however patient's blood pressure is low and therefore she is unable to tolerate these medications. Coreg dose is also been decreased.  3.  Severe mitral regurgitation: Patient will have follow-up with cardiology  4.  Essential hypertension: Patient's blood pressure can only tolerate low-dose Coreg  5.  Hypothyroidism: Continue Synthroid  6.  Chronic kidney disease stage III: Creatinine stable  7.  Chronic lower extremity edema: Patient goes to wound care center for Unna boots  DISCHARGE CONDITIONS AND DIET:   Stable for discharge on cardiac diet  CONSULTS OBTAINED:  Treatment Team:  Lamar Blinks, MD  DRUG ALLERGIES:   Allergies  Allergen Reactions  . Lortab [Hydrocodone-Acetaminophen] Itching, Swelling and Rash  . Doxycycline Diarrhea  . Macrobid [Nitrofurantoin Monohyd Macro] Diarrhea and Nausea And Vomiting  . Nitrofurantoin Diarrhea, Nausea Only and Nausea And Vomiting    DISCHARGE MEDICATIONS:   Allergies as of 09/26/2017      Reactions   Lortab [hydrocodone-acetaminophen] Itching, Swelling, Rash   Doxycycline Diarrhea   Macrobid [nitrofurantoin Monohyd Macro] Diarrhea, Nausea And Vomiting   Nitrofurantoin Diarrhea, Nausea Only, Nausea And Vomiting      Medication List    STOP taking these medications   aspirin EC 81 MG tablet   hydrochlorothiazide 25 MG tablet Commonly known as:  HYDRODIURIL   silver sulfADIAZINE 1 % cream Commonly known as:  SILVADENE   temazepam 7.5 MG capsule Commonly known as:  RESTORIL     TAKE  these medications   acetaminophen 500 MG tablet Commonly known as:  TYLENOL Take 1,000 mg by mouth every 8 (eight) hours as needed for mild pain or headache.   carvedilol 6.25 MG tablet Commonly known as:  COREG Take 1 tablet (6.25 mg total) by mouth 2 (two) times daily. What changed:    medication strength  how much to take   furosemide 20 MG tablet Commonly known as:   LASIX Take 2 tablets (40 mg total) by mouth daily. What changed:    how much to take  when to take this  reasons to take this   gabapentin 300 MG capsule Commonly known as:  NEURONTIN Take 1 capsule (300 mg total) by mouth 2 (two) times daily.   levalbuterol 45 MCG/ACT inhaler Commonly known as:  XOPENEX HFA Inhale 1-2 puffs into the lungs every 6 (six) hours as needed for wheezing or shortness of breath.   levothyroxine 25 MCG tablet Commonly known as:  SYNTHROID, LEVOTHROID TAKE 1 TABLET EVERY DAY ON EMPTY STOMACHWITH A GLASS OF WATER AT LEAST 30-60 MINBEFORE BREAKFAST   nitroGLYCERIN 0.4 MG SL tablet Commonly known as:  NITROSTAT Place 0.4 mg under the tongue every 5 (five) minutes as needed for chest pain.   omeprazole 20 MG capsule Commonly known as:  PRILOSEC Take 20 mg by mouth daily.   oxyCODONE-acetaminophen 5-325 MG tablet Commonly known as:  PERCOCET/ROXICET Take 1 tablet by mouth 2 (two) times daily as needed for severe pain. For chronic pain Fill on or after 05/03/2017, 06/02/2017   triamcinolone cream 0.1 % Commonly known as:  KENALOG Apply 1 application topically 2 (two) times daily as needed (for rash/skin irritation.).   vitamin B-12 1000 MCG tablet Commonly known as:  CYANOCOBALAMIN Take 1,000 mcg by mouth daily.         Today   CHIEF COMPLAINT:  Doing well wants to go home   VITAL SIGNS:  Blood pressure 124/80, pulse 78, temperature 98.4 F (36.9 C), temperature source Oral, resp. rate 18, height 5\' 4"  (1.626 m), weight 63.1 kg (139 lb 1.8 oz), SpO2 99 %.   REVIEW OF SYSTEMS:  Review of Systems  Constitutional: Negative.  Negative for chills, fever and malaise/fatigue.  HENT: Negative.  Negative for ear discharge, ear pain, hearing loss, nosebleeds and sore throat.   Eyes: Negative.  Negative for blurred vision and pain.  Respiratory: Negative.  Negative for cough, hemoptysis, shortness of breath and wheezing.   Cardiovascular: Positive  for leg swelling. Negative for chest pain, palpitations and PND.  Gastrointestinal: Negative.  Negative for abdominal pain, blood in stool, diarrhea, nausea and vomiting.  Genitourinary: Negative.  Negative for dysuria.  Musculoskeletal: Negative.  Negative for back pain.  Skin: Negative.   Neurological: Negative for dizziness, tremors, speech change, focal weakness, seizures and headaches.  Endo/Heme/Allergies: Negative.  Does not bruise/bleed easily.  Psychiatric/Behavioral: Negative.  Negative for depression, hallucinations and suicidal ideas.     PHYSICAL EXAMINATION:  GENERAL:  84 y.o.-year-old patient lying in the bed with no acute distress.  NECK:  Supple, no jugular venous distention. No thyroid enlargement, no tenderness.  LUNGS: Normal breath sounds bilaterally, no wheezing, rales,rhonchi  No use of accessory muscles of respiration.  CARDIOVASCULAR: S1, S2 normal.4/6  murmurs, NO rubs, or gallops.  ABDOMEN: Soft, non-tender, non-distended. Bowel sounds present. No organomegaly or mass.  EXTREMITIES:3+ LEE NO cyanosis, or clubbing.  PSYCHIATRIC: The patient is alert and oriented x 3.  SKIN: No obvious rash, lesion, or ulcer.  DATA REVIEW:   CBC Recent Labs  Lab 09/24/17 0535  WBC 4.2  HGB 11.1*  HCT 34.4*  PLT 124*    Chemistries  Recent Labs  Lab 09/26/17 0445  NA 133*  K 4.7  CL 94*  CO2 32  GLUCOSE 87  BUN 20  CREATININE 1.04*  CALCIUM 8.4*    Cardiac Enzymes Recent Labs  Lab 09/23/17 1645  TROPONINI <0.03    Microbiology Results  @MICRORSLT48 @  RADIOLOGY:  No results found.    Allergies as of 09/26/2017      Reactions   Lortab [hydrocodone-acetaminophen] Itching, Swelling, Rash   Doxycycline Diarrhea   Macrobid [nitrofurantoin Monohyd Macro] Diarrhea, Nausea And Vomiting   Nitrofurantoin Diarrhea, Nausea Only, Nausea And Vomiting      Medication List    STOP taking these medications   aspirin EC 81 MG tablet   hydrochlorothiazide  25 MG tablet Commonly known as:  HYDRODIURIL   silver sulfADIAZINE 1 % cream Commonly known as:  SILVADENE   temazepam 7.5 MG capsule Commonly known as:  RESTORIL     TAKE these medications   acetaminophen 500 MG tablet Commonly known as:  TYLENOL Take 1,000 mg by mouth every 8 (eight) hours as needed for mild pain or headache.   carvedilol 6.25 MG tablet Commonly known as:  COREG Take 1 tablet (6.25 mg total) by mouth 2 (two) times daily. What changed:    medication strength  how much to take   furosemide 20 MG tablet Commonly known as:  LASIX Take 2 tablets (40 mg total) by mouth daily. What changed:    how much to take  when to take this  reasons to take this   gabapentin 300 MG capsule Commonly known as:  NEURONTIN Take 1 capsule (300 mg total) by mouth 2 (two) times daily.   levalbuterol 45 MCG/ACT inhaler Commonly known as:  XOPENEX HFA Inhale 1-2 puffs into the lungs every 6 (six) hours as needed for wheezing or shortness of breath.   levothyroxine 25 MCG tablet Commonly known as:  SYNTHROID, LEVOTHROID TAKE 1 TABLET EVERY DAY ON EMPTY STOMACHWITH A GLASS OF WATER AT LEAST 30-60 MINBEFORE BREAKFAST   nitroGLYCERIN 0.4 MG SL tablet Commonly known as:  NITROSTAT Place 0.4 mg under the tongue every 5 (five) minutes as needed for chest pain.   omeprazole 20 MG capsule Commonly known as:  PRILOSEC Take 20 mg by mouth daily.   oxyCODONE-acetaminophen 5-325 MG tablet Commonly known as:  PERCOCET/ROXICET Take 1 tablet by mouth 2 (two) times daily as needed for severe pain. For chronic pain Fill on or after 05/03/2017, 06/02/2017   triamcinolone cream 0.1 % Commonly known as:  KENALOG Apply 1 application topically 2 (two) times daily as needed (for rash/skin irritation.).   vitamin B-12 1000 MCG tablet Commonly known as:  CYANOCOBALAMIN Take 1,000 mcg by mouth daily.         Management plans discussed with the patient and she is in agreement. Stable  for discharge   Patient should follow up with pcp  CODE STATUS:     Code Status Orders  (From admission, onward)        Start     Ordered   09/24/17 2020  Do not attempt resuscitation (DNR)  Continuous    Question Answer Comment  In the event of cardiac or respiratory ARREST Do not call a "code blue"   In the event of cardiac or respiratory ARREST Do not perform Intubation, CPR,  defibrillation or ACLS   In the event of cardiac or respiratory ARREST Use medication by any route, position, wound care, and other measures to relive pain and suffering. May use oxygen, suction and manual treatment of airway obstruction as needed for comfort.      09/24/17 2019    Code Status History    Date Active Date Inactive Code Status Order ID Comments User Context   09/24/2017 0110 09/24/2017 2019 Full Code [id number]  Oralia Manis, MD Inpatient   02/08/2017 1410 02/08/2017 1746 Full Code [id number]  Renford Dills, MD Inpatient   08/27/2016 1914 08/29/2016 1938 DNR [id number]  Shaune Pollack, MD Inpatient   06/01/2016 1907 06/03/2016 1739 DNR [id number]  Ramonita Lab, MD ED   03/23/2016 1512 03/24/2016 2346 Full Code [id number]  Gilda Crease, Latina Craver, MD Inpatient   03/23/2016 1430 03/23/2016 1503 Full Code [id number]  Renford Dills, MD Inpatient   03/11/2015 1635 03/18/2015 1741 Full Code [id number]  Ramonita Lab, MD Inpatient   03/11/2015 1438 03/11/2015 1635 Full Code [id number]  Ramonita Lab, MD ED   10/08/2014 1050 10/08/2014 1712 Full Code [id number]  Schnier, Latina Craver, MD Inpatient    Advance Directive Documentation     Most Recent Value  Type of Advance Directive  Healthcare Power of Attorney  Pre-existing out of facility DNR order (yellow form or pink MOST form)  -  "MOST" Form in Place?  -      TOTAL TIME TAKING CARE OF THIS PATIENT: 38 minutes.    Note: This dictation was prepared with Dragon dictation along with smaller phrase technology. Any transcriptional errors that result from this process  are unintentional.  Loletha Bertini M.D on 09/26/2017 at 10:59 AM  Between 7am to 6pm - Pager - [phone] After 6pm go to [email or web address] - Social research officer, government  [facility]  Office  [phone]  CC: Primary care physician; Patrice Paradise, MD

## 2017-09-26 NOTE — Care Management (Addendum)
Patient discharging home today. Physical therapy has recommended skilled nursing placement but patient declines.  Resides at [facility].  She is on chronic home 02 with [facility].  She says her  portable 02 tanks are too heavy.  Spoke with [facility] and patient has a Marine scientist.  Agency will meet with patient to discuss lighter weight devices.  Agency will bring portable tank to hospital for transport home.  Agency preference is Nurse, learning disability.   Called referral for RN PT Aide and SW.  [facility] referral made for disease management.  Patient says "I have no family- there is no one to help me with anything."  The person at bedside is a friend that provides support when needed.  If patient is not able to ambulate to the dinning [place] at [facility], her meals can be brought to her.  She does have a walker .  Requested visit within 24 hours of admission and informed this request could be met.

## 2017-09-26 NOTE — Plan of Care (Signed)
Nutrition Education Note  RD consulted for nutrition education regarding CHF.  Met with pt in room today. Pt lives in a retirement community where she has little choice over the food that is provided to her. Pt does eat some food brought in by her daughter that consists mainly of frozen dinners and sandwiches as she only has a microwave in her room. RD discussed with pt's daughter about the importance of not consuming excess sodium. Made recommendations such as purchasing low sodium frozen meals and getting sandwich meats from the deli vs the refrigerator section of the grocery store. Pt with noted muscle depletions would discourage recommendation of 2 gram sodium diet as this will limit protein also and lead to malnutrition.    RD provided "Low Sodium Nutrition Therapy" handout from the Academy of Nutrition and Dietetics. Reviewed patient's dietary recall. Provided examples on ways to decrease sodium intake in diet. Discouraged intake of processed foods and use of salt shaker. Encouraged fresh fruits and vegetables as well as whole grain sources of carbohydrates to maximize fiber intake.   RD discussed why it is important for patient to adhere to diet recommendations, and emphasized the role of fluids, foods to avoid, and importance of weighing self daily. Teach back method used.  Expect good compliance.  Body mass index is 23.88 kg/m. Pt meets criteria for normal weight based on current BMI.  Current diet order is HH, patient is consuming approximately 95% of meals at this time. Labs and medications reviewed. No further nutrition interventions warranted at this time. RD contact information provided. If additional nutrition issues arise, please re-consult RD.   Koleen Distance MS, RD, LDN Pager #- [phone] Office#- [phone] After Hours Pager: [phone]

## 2017-09-26 NOTE — Progress Notes (Signed)
IV and tele removed from patient. Discharge instructions given to patient along with hard copy prescriptions. Verbalized understanding. No acute distress. Friend to transport patient back to [facility].

## 2017-09-26 NOTE — Progress Notes (Signed)
Anticoagulation monitoring(Lovenox):  84yo  F ordered Lovenox 30 mg Q24h  Filed Weights   09/23/17 1643 09/25/17 0319 09/26/17 0500  Weight: 140 lb (63.5 kg) 141 lb 4.8 oz (64.1 kg) 139 lb 1.8 oz (63.1 kg)   BMI 23.87   Lab Results  Component Value Date   CREATININE 1.04 (H) 09/26/2017   CREATININE 1.26 (H) 09/25/2017   CREATININE 1.20 (H) 09/24/2017   Estimated Creatinine Clearance: 33.5 mL/min (A) (by C-G formula based on SCr of 1.04 mg/dL (H)). Hemoglobin & Hematocrit     Component Value Date/Time   HGB 11.1 (L) 09/24/2017 0535   HGB 11.3 (L) 11/02/2013 0540   HCT 34.4 (L) 09/24/2017 0535   HCT 34.6 (L) 11/02/2013 0540     Per Protocol for Patient with estCrcl > 30 ml/min and BMI < 40, will transition to Lovenox 40 mg Q24h      Bari Mantis PharmD Clinical Pharmacist 09/26/2017

## 2017-09-26 NOTE — Patient Outreach (Signed)
[facility]) Care Management  09/26/2017  Joanna Roberts February 28, 1929 [id number]   Telephone Screen  Referral Date: 09/22/17 Referral Source: [facility] Referral from [facility] team at [facility] Referral Reason: Member request nurse for home services, concerns voiced with transportation to appointments Insurance: [facility]   [facility] CM noted Joanna Roberts has been hospitalized on 09/23/17.   Epic message to [facility] liaison sent (requesting possible post hospitalization referral prn)  Plan: RN CM will close this case at this time  Keylee Shrestha L. Noelle Penner, RN, BSN, CCM [facility] Telephonic Care Management Care Coordinator Direct number [phone]  [facility] number [phone] Fax number [phone]

## 2017-09-27 ENCOUNTER — Other Ambulatory Visit: Payer: Self-pay | Admitting: *Deleted

## 2017-09-27 ENCOUNTER — Encounter (INDEPENDENT_AMBULATORY_CARE_PROVIDER_SITE_OTHER): Payer: PPO

## 2017-09-27 ENCOUNTER — Ambulatory Visit: Payer: Self-pay | Admitting: *Deleted

## 2017-09-27 DIAGNOSIS — I130 Hypertensive heart and chronic kidney disease with heart failure and stage 1 through stage 4 chronic kidney disease, or unspecified chronic kidney disease: Secondary | ICD-10-CM | POA: Diagnosis not present

## 2017-09-27 DIAGNOSIS — N183 Chronic kidney disease, stage 3 (moderate): Secondary | ICD-10-CM | POA: Diagnosis not present

## 2017-09-27 DIAGNOSIS — I5043 Acute on chronic combined systolic (congestive) and diastolic (congestive) heart failure: Secondary | ICD-10-CM | POA: Diagnosis not present

## 2017-09-27 DIAGNOSIS — J9621 Acute and chronic respiratory failure with hypoxia: Secondary | ICD-10-CM | POA: Diagnosis not present

## 2017-09-27 DIAGNOSIS — E039 Hypothyroidism, unspecified: Secondary | ICD-10-CM | POA: Diagnosis not present

## 2017-09-27 NOTE — Patient Outreach (Signed)
[facility]) Care Management  09/27/2017  ELEXIA FRIEDT 17-Jan-1929 [id number]   Telephone Screen  Referral Date: 09/27/17 Referral Source: From [facility] hospital liaison, A Crume Referral Reason: Recent discharge from hospital for CHF Date of Admission:  09/23/17 Diagnosis: CHF and acute respiratory failure with hypoxia Date of Discharge: 09/26/17 Facility: [facility] Insurance:  HTA  Mrs [place] returned a call to [facility] CM. Patient is able to verify HIPAA CM reviewed referrals reasons sent to [facility] CM for HTA referral and recent discharge from hospital- transition of care  Transition of Care will be completed by primary care provider office who will refer to [facility] care management if needed. CM assessed her knowledge of her d/c instructions and CHF s/s. She denies s/s Confirms daily wt- today wt 136 lbs and noted to be a little sob initially in conversation.  Her listed d/c wt was 139 lbs. She confirms she has her oxygen and "hose" intact. CM encouraged her to follow her low sodium diet, and take her medications (lasix, etc) as ordered. Confirms she is seen by the vascular RN for [facility] wraps weekly.  She reports having food from the facility that is too salty. CM encouraged more fruits and vegetables.  Social: Lives alone in an independent living facility, [place].  She reports she is independent with IADLs but needs assist with bathing and dressing in the mornings.  Present Medical POA is her grandson who does not live locally.  She is wanting to change her Medical POA to her cousin who lives in [place], [place]. She states she has transportation via bus services from her facility most days except on Mondays and would need assist to CHF clinic appointments  Conditions: CHF, PAD, Atrial fibrillation, pacemaker and defibrillator, PAD, GERD, HTN, Acquired Hypothyroidism  Medications: Pt states she is not near her medications but CM reviewed listed medications on her hospital after summary with her.   Denies concerns with getting medications. They are delivered to her by [facility] in bottles She uses Oxygen 2 l [place] at home  Appointments: confirms she has a follow up appointment with primary MD on June 10 at 0930 and CV, Dr Lady Gary on October 03 2017 at 1030 Confirms the [place] RN visited today  Consent: [facility] RN CM reviewed [facility] services with patient. Patient gave verbal consent for services.   Plan: [facility] RN CM will follow up with Mrs Rosen within 8 business days and plan for closure if no concerns CM noted patient is already referred to [facility], [employer] L. Noelle Penner, RN, BSN, CCM [facility] Telephonic Care Management Care Coordinator Direct number [phone]  [facility] number [phone] Fax number [phone]

## 2017-09-29 ENCOUNTER — Telehealth (INDEPENDENT_AMBULATORY_CARE_PROVIDER_SITE_OTHER): Payer: Self-pay

## 2017-09-29 DIAGNOSIS — R102 Pelvic and perineal pain: Secondary | ICD-10-CM | POA: Diagnosis not present

## 2017-09-29 DIAGNOSIS — T839XXA Unspecified complication of genitourinary prosthetic device, implant and graft, initial encounter: Secondary | ICD-10-CM | POA: Diagnosis not present

## 2017-09-29 NOTE — Telephone Encounter (Signed)
Spoke with the patient and she is aware of [facility] making contact to come in and do her Unna boots.

## 2017-09-29 NOTE — Telephone Encounter (Signed)
Spoke with intake at [facility] and faxed over the information needed for them to start seeing the patient for Unna boot placement once weekly.

## 2017-09-30 ENCOUNTER — Other Ambulatory Visit: Payer: Self-pay

## 2017-09-30 NOTE — Patient Outreach (Signed)
[facility]) Care Management  09/30/2017  SALEM MASTROGIOVANNI [birth year] [id number]  BSW attempted to contact patient regarding recent referral for transportation options. Unfortunately patient did not answer. BSW left a  HIPAA compliant voice message requesting a return call.  Plan: BSW will send patient an unsuccessful outreach letter requesting contact. BSW will attempt patient again within  The next four business days.  Bevelyn Ngo, Kenard Gower, CDP Social Worker [phone]

## 2017-09-30 NOTE — Patient Outreach (Signed)
[facility]) Care Management  09/30/2017  MAJESTI GAMBRELL February 06, 1929 [id number]  BSW received a return call from patient. HIPAA identifiers verified. BSW introduced self and reason for call. Patient informed this BSW that she no longer has transportation needs. Patient has rescheduled her future appointments to days that transportation is provided by the independent living community.  Patient identifies that she is in need of CNA services to assist with dressing and bathing. Patient is willing to pay out of pocket pending costs. During the call, patient stated she is expecting a visit from someone with "affordable care insurance" this afternoon and plans to discuss options with in-home services.   Patient agreeable to have BSW contact patient early next week to discuss outcome of today's visit. If patient needs further assistance with resources BSW will provide to patient at that time.  Bevelyn Ngo, Kenard Gower, CDP Social Worker [phone]

## 2017-10-03 ENCOUNTER — Ambulatory Visit: Payer: PPO | Admitting: Family

## 2017-10-03 ENCOUNTER — Other Ambulatory Visit: Payer: Self-pay

## 2017-10-03 ENCOUNTER — Telehealth (INDEPENDENT_AMBULATORY_CARE_PROVIDER_SITE_OTHER): Payer: Self-pay

## 2017-10-03 NOTE — Patient Outreach (Signed)
[facility]) Care Management  10/03/2017  Joanna Roberts May 19, 1928 [id number]  BSW contacted patient to follow up on need of in home CNA services. HIPAA identifiers verified. Patient stated she has obtained services through the community she resides in. Patient reports she pays extra for someone to come assist with morning care as well as evening care with assisting patient to bed. Patients requests this BSW contact at a later date to verify no other needs. BSW will plan to contact patient in the next 3-4 weeks. BSW requested patient contact this BSW directly if needs arise before next scheduled call.  Bevelyn Ngo, Kenard Gower, CDP Social Worker [phone]

## 2017-10-03 NOTE — Telephone Encounter (Signed)
She is more than welcome to have bilateral unna boots placed again.  It is imperative that she elevate her legs heart level or higher if she decides to have the unna boots placed.  I also strongly recommend that she keep her appointment with her cardiologist as I feel the fluid buildup in her legs is from heart failure - she may need to have her diuretics adjusted.  This is not something that we do in our office.

## 2017-10-03 NOTE — Telephone Encounter (Signed)
I spoke with the patient and inform her with [facility] Batten medical advise.The patient stated that home health is suppose to start coming in the home for unna boots but if she does not here from anyone today or tomorrow she will call the office back.

## 2017-10-04 ENCOUNTER — Other Ambulatory Visit: Payer: Self-pay | Admitting: *Deleted

## 2017-10-04 ENCOUNTER — Encounter (INDEPENDENT_AMBULATORY_CARE_PROVIDER_SITE_OTHER): Payer: PPO

## 2017-10-04 DIAGNOSIS — I5042 Chronic combined systolic (congestive) and diastolic (congestive) heart failure: Secondary | ICD-10-CM | POA: Diagnosis not present

## 2017-10-04 DIAGNOSIS — I481 Persistent atrial fibrillation: Secondary | ICD-10-CM | POA: Diagnosis not present

## 2017-10-04 DIAGNOSIS — I739 Peripheral vascular disease, unspecified: Secondary | ICD-10-CM | POA: Diagnosis not present

## 2017-10-04 DIAGNOSIS — R601 Generalized edema: Secondary | ICD-10-CM | POA: Diagnosis not present

## 2017-10-04 DIAGNOSIS — I10 Essential (primary) hypertension: Secondary | ICD-10-CM | POA: Diagnosis not present

## 2017-10-04 DIAGNOSIS — I429 Cardiomyopathy, unspecified: Secondary | ICD-10-CM | POA: Diagnosis not present

## 2017-10-04 DIAGNOSIS — I872 Venous insufficiency (chronic) (peripheral): Secondary | ICD-10-CM | POA: Diagnosis not present

## 2017-10-04 DIAGNOSIS — K551 Chronic vascular disorders of intestine: Secondary | ICD-10-CM | POA: Diagnosis not present

## 2017-10-04 NOTE — Patient Outreach (Signed)
[facility]) Care Management  10/04/2017  Joanna Roberts 07/26/1928 [id number]   Care coordination/case closure  Referral Date: 09/27/17 Referral Source: From [facility] hospital liaison, A Santarelli Referral Reason: Recent discharge from hospital for CHF Date of Admission:  09/23/17 Diagnosis: CHF and acute respiratory failure with hypoxia Date of Discharge: 09/26/17 Facility: [facility] Insurance:  HTA  [facility] RN CM followed up with [name] related to contact for transportation needs and home care services.  Patient is able to verify HIPAA She reports she is doing well.  She reports her transportation needs have been resolved and she has obtained personal care services and the staff member is present at the time of this call.  She reports having to pay for this service out of pocket and that she is "okay with this. Denies other needs with medicines, DME, education, etc   Plan: [facility] RN CM will close case at this time as patient has been assessed and no needs identified.     [name], RN, BSN, CCM [facility] Telephonic Care Management Care Coordinator Direct number [phone]  [facility] number [phone] Fax number [phone]

## 2017-10-05 ENCOUNTER — Ambulatory Visit (INDEPENDENT_AMBULATORY_CARE_PROVIDER_SITE_OTHER): Payer: PPO | Admitting: Vascular Surgery

## 2017-10-05 ENCOUNTER — Ambulatory Visit: Payer: PPO

## 2017-10-05 ENCOUNTER — Encounter (INDEPENDENT_AMBULATORY_CARE_PROVIDER_SITE_OTHER): Payer: Self-pay

## 2017-10-05 VITALS — BP 128/85 | HR 76 | Resp 16 | Ht 64.0 in | Wt 137.0 lb

## 2017-10-05 DIAGNOSIS — I890 Lymphedema, not elsewhere classified: Secondary | ICD-10-CM | POA: Diagnosis not present

## 2017-10-05 NOTE — Progress Notes (Signed)
History of Present Illness  There is no documented history at this time  Assessments & Plan   There are no diagnoses linked to this encounter.    Additional instructions  Subjective:  Patient presents with venous ulcer of the Bilateral lower extremity.    Procedure:  3 layer unna wrap was placed Bilateral lower extremity.   Plan:   Follow up in one week.  

## 2017-10-05 NOTE — Telephone Encounter (Signed)
Patient came in for [facility] boot today.

## 2017-10-06 DIAGNOSIS — I5043 Acute on chronic combined systolic (congestive) and diastolic (congestive) heart failure: Secondary | ICD-10-CM | POA: Diagnosis not present

## 2017-10-06 DIAGNOSIS — I130 Hypertensive heart and chronic kidney disease with heart failure and stage 1 through stage 4 chronic kidney disease, or unspecified chronic kidney disease: Secondary | ICD-10-CM | POA: Diagnosis not present

## 2017-10-06 DIAGNOSIS — N183 Chronic kidney disease, stage 3 (moderate): Secondary | ICD-10-CM | POA: Diagnosis not present

## 2017-10-06 DIAGNOSIS — E039 Hypothyroidism, unspecified: Secondary | ICD-10-CM | POA: Diagnosis not present

## 2017-10-06 DIAGNOSIS — J9621 Acute and chronic respiratory failure with hypoxia: Secondary | ICD-10-CM | POA: Diagnosis not present

## 2017-10-08 DIAGNOSIS — J449 Chronic obstructive pulmonary disease, unspecified: Secondary | ICD-10-CM | POA: Diagnosis not present

## 2017-10-10 ENCOUNTER — Other Ambulatory Visit: Payer: Self-pay

## 2017-10-10 NOTE — Patient Outreach (Signed)
[facility]) Care Management  10/10/2017  Joanna Roberts Feb 21, 1929 [id number]  Upon review of chart, BSW noted successful call between patient and [facility] nurse care manager on 6/11. BSW will close social work episode given patient was able to verify all care needs have been met.  Daneen Schick, Arita Miss, CDP Social Worker [phone]

## 2017-10-10 NOTE — Telephone Encounter (Signed)
Spoke with the [facility] on 10/05/17 and they were setting the patient up to have home health place her Unna boots, I spoke with the patient today and [facility] has of now not contacted the patient . I gave the the patient the number to [facility] and asked if she would call them and if they did not come out to do her unna boots to call our office so we can get her in to have them replaced.

## 2017-10-11 ENCOUNTER — Encounter (INDEPENDENT_AMBULATORY_CARE_PROVIDER_SITE_OTHER): Payer: PPO

## 2017-10-14 ENCOUNTER — Telehealth (INDEPENDENT_AMBULATORY_CARE_PROVIDER_SITE_OTHER): Payer: Self-pay

## 2017-10-14 ENCOUNTER — Telehealth (INDEPENDENT_AMBULATORY_CARE_PROVIDER_SITE_OTHER): Payer: Self-pay | Admitting: Vascular Surgery

## 2017-10-14 NOTE — Telephone Encounter (Signed)
Patient called stating that she removed her Unna boot because it was put on to tight by her home health nurse. She then called the home health nurse to see if they could put the Unna boot back on, she was told by the nurse that they would need a new order for her to come back out to place the [place] boot back on. I called the patient back to get the nurses information and number so that I could call her and give her a verbal over the phone.

## 2017-10-14 NOTE — Telephone Encounter (Signed)
I just spoke to the patient about 15 minutes ago. There is a note on everything that we discussed.

## 2017-10-14 NOTE — Telephone Encounter (Signed)
Talked to [facility] Nurses and the patient to let her know the reason for the nursing service having to stop the [employer] wrap. The patient expressed her understanding and stated that she will keep her appointment with the doctor for next week and that she will wear her compression stocking and elevate daily until then.

## 2017-10-14 NOTE — Telephone Encounter (Signed)
Called [facility] to get an understanding as to what is going on with the Unna boot wraps for this patient. The nurse Joanna Roberts told me that they did a complete assessment of the patient's legs and they did not find anything "skilled" or anything that they could lawfully send a nurse out to wrap this patient's legs for being that she does not have any open wounds or anything, not even swelling going on. She states that they will continue her PT, but no more wraps will be applied.   I called the patient back to let her know that she should start back wearing her compression stockings and elevating daily until she comes back into the office to se Joanna Roberts. She states that she has been doing this since yesterday and that if things should worsen, then she would give [place] a call to see if she could come in sooner.

## 2017-10-14 NOTE — Telephone Encounter (Signed)
I tried calling the patient at 8:25 am, 10:15 am, and 11:55 am and I could not leave a message because her voicemail was not setup. I was just calling her to get the name of the agency and her nurses name in order to call in and give them the verbal order for her Unna boots to continue to be placed.

## 2017-10-18 DIAGNOSIS — R0609 Other forms of dyspnea: Secondary | ICD-10-CM | POA: Diagnosis not present

## 2017-10-18 DIAGNOSIS — I495 Sick sinus syndrome: Secondary | ICD-10-CM | POA: Diagnosis not present

## 2017-10-19 DIAGNOSIS — I5043 Acute on chronic combined systolic (congestive) and diastolic (congestive) heart failure: Secondary | ICD-10-CM | POA: Diagnosis not present

## 2017-10-19 DIAGNOSIS — J9621 Acute and chronic respiratory failure with hypoxia: Secondary | ICD-10-CM | POA: Diagnosis not present

## 2017-10-19 DIAGNOSIS — I130 Hypertensive heart and chronic kidney disease with heart failure and stage 1 through stage 4 chronic kidney disease, or unspecified chronic kidney disease: Secondary | ICD-10-CM | POA: Diagnosis not present

## 2017-10-19 DIAGNOSIS — N183 Chronic kidney disease, stage 3 (moderate): Secondary | ICD-10-CM | POA: Diagnosis not present

## 2017-10-19 DIAGNOSIS — E039 Hypothyroidism, unspecified: Secondary | ICD-10-CM | POA: Diagnosis not present

## 2017-10-20 ENCOUNTER — Encounter (INDEPENDENT_AMBULATORY_CARE_PROVIDER_SITE_OTHER): Payer: Self-pay | Admitting: Vascular Surgery

## 2017-10-20 ENCOUNTER — Ambulatory Visit (INDEPENDENT_AMBULATORY_CARE_PROVIDER_SITE_OTHER): Payer: PPO | Admitting: Vascular Surgery

## 2017-10-20 VITALS — BP 131/79 | HR 79 | Resp 14 | Ht 62.0 in | Wt 131.0 lb

## 2017-10-20 DIAGNOSIS — I70213 Atherosclerosis of native arteries of extremities with intermittent claudication, bilateral legs: Secondary | ICD-10-CM

## 2017-10-20 DIAGNOSIS — I482 Chronic atrial fibrillation, unspecified: Secondary | ICD-10-CM

## 2017-10-20 DIAGNOSIS — I509 Heart failure, unspecified: Secondary | ICD-10-CM | POA: Diagnosis not present

## 2017-10-20 DIAGNOSIS — I890 Lymphedema, not elsewhere classified: Secondary | ICD-10-CM | POA: Diagnosis not present

## 2017-10-20 DIAGNOSIS — I872 Venous insufficiency (chronic) (peripheral): Secondary | ICD-10-CM

## 2017-10-20 DIAGNOSIS — I10 Essential (primary) hypertension: Secondary | ICD-10-CM

## 2017-10-20 DIAGNOSIS — Z4689 Encounter for fitting and adjustment of other specified devices: Secondary | ICD-10-CM | POA: Diagnosis not present

## 2017-10-20 NOTE — Progress Notes (Signed)
MRN : [id number]  Joanna Roberts is a 84 y.o. (1929-03-12) female who presents with chief complaint of  Chief Complaint  Patient presents with  . Follow-up    Unna boot check-Bilateral  .  History of Present Illness: The patient returns to the office for followup evaluation regarding leg swelling.  The swelling has persisted and the pain associated with swelling continues. There have not been any interval development of a ulcerations or wounds.  Since the previous visit the patient states she has been wearing graduated compression wraps "all the time just not today because of the visit" and has noted little if any improvement in the lymphedema. The patient has been using compression routinely morning until night.  The patient also states elevation during the day is being done but she has so many Dr appointments every day this is a problem for her.  She was admitted to [facility] earlier this month for CHF and has been put on a sodium free diet which she finds to be a problem   Current Meds  Medication Sig  . acetaminophen (TYLENOL) 500 MG tablet Take 1,000 mg by mouth every 8 (eight) hours as needed for mild pain or headache.  . carvedilol (COREG) 6.25 MG tablet Take 1 tablet (6.25 mg total) by mouth 2 (two) times daily.  . furosemide (LASIX) 20 MG tablet Take 2 tablets (40 mg total) by mouth daily.  Marland Kitchen gabapentin (NEURONTIN) 300 MG capsule Take 1 capsule (300 mg total) by mouth 2 (two) times daily.  Marland Kitchen levalbuterol (XOPENEX HFA) 45 MCG/ACT inhaler Inhale 1-2 puffs into the lungs every 6 (six) hours as needed for wheezing or shortness of breath.   . levothyroxine (SYNTHROID, LEVOTHROID) 25 MCG tablet TAKE 1 TABLET EVERY DAY ON EMPTY STOMACHWITH A GLASS OF WATER AT LEAST 30-60 MINBEFORE BREAKFAST  . nitroGLYCERIN (NITROSTAT) 0.4 MG SL tablet Place 0.4 mg under the tongue every 5 (five) minutes as needed for chest pain.   Marland Kitchen omeprazole (PRILOSEC) 20 MG capsule Take 20 mg by mouth daily.   Marland Kitchen  oxyCODONE-acetaminophen (PERCOCET/ROXICET) 5-325 MG tablet Take 1 tablet by mouth 2 (two) times daily as needed for severe pain. For chronic pain Fill on or after 05/03/2017, 06/02/2017  . temazepam (RESTORIL) 7.5 MG capsule   . triamcinolone cream (KENALOG) 0.1 % Apply 1 application topically 2 (two) times daily as needed (for rash/skin irritation.).   Marland Kitchen vitamin B-12 (CYANOCOBALAMIN) 1000 MCG tablet Take 1,000 mcg by mouth daily.    Past Medical History:  Diagnosis Date  . AICD (automatic cardioverter/defibrillator) present   . Allergy   . Anemia   . Arthritis   . Cataract   . CHF (congestive heart failure) (HCC)   . Depression   . Dysrhythmia   . GERD (gastroesophageal reflux disease)   . Heart murmur   . History of TIAs   . Hypertension   . Hypothyroidism   . Myocardial infarction (HCC)   . Osteoporosis   . Presence of permanent cardiac pacemaker   . Thyroid disease     Past Surgical History:  Procedure Laterality Date  . ABDOMINAL HYSTERECTOMY    . APPENDECTOMY    . HERNIA REPAIR    . IMPLANTABLE CARDIOVERTER DEFIBRILLATOR (ICD) GENERATOR CHANGE Left 07/27/2017   Procedure: ICD GENERATOR CHANGE;  Surgeon: Marcina Millard, MD;  Location: [facility] ORS;  Service: Cardiovascular;  Laterality: Left;  . INSERT / REPLACE / REMOVE PACEMAKER    . LOWER EXTREMITY ANGIOGRAPHY Left 02/08/2017  Procedure: Lower Extremity Angiography;  Surgeon: Renford Dills, MD;  Location: [facility] INVASIVE CV LAB;  Service: Cardiovascular;  Laterality: Left;  . PERIPHERAL VASCULAR CATHETERIZATION Right 10/08/2014   Procedure: Lower Extremity Angiography;  Surgeon: Renford Dills, MD;  Location: [facility] INVASIVE CV LAB;  Service: Cardiovascular;  Laterality: Right;  . PERIPHERAL VASCULAR CATHETERIZATION  10/08/2014   Procedure: Lower Extremity Intervention;  Surgeon: Renford Dills, MD;  Location: [facility] INVASIVE CV LAB;  Service: Cardiovascular;;  . PERIPHERAL VASCULAR CATHETERIZATION N/A 03/23/2016    Procedure: Visceral Angiography;  Surgeon: Renford Dills, MD;  Location: [facility] INVASIVE CV LAB;  Service: Cardiovascular;  Laterality: N/A;  . pessary      Social History Social History   Tobacco Use  . Smoking status: Never Smoker  . Smokeless tobacco: Never Used  Substance Use Topics  . Alcohol use: No  . Drug use: No    Family History Family History  Problem Relation Age of Onset  . Early death Mother   . Heart disease Mother   . Arthritis Father   . Asthma Father   . Stroke Sister     Allergies  Allergen Reactions  . Lortab [Hydrocodone-Acetaminophen] Itching, Swelling and Rash  . Doxycycline Diarrhea  . Macrobid [Nitrofurantoin Monohyd Macro] Diarrhea and Nausea And Vomiting  . Nitrofurantoin Diarrhea, Nausea Only and Nausea And Vomiting     REVIEW OF SYSTEMS (Negative unless checked)  Constitutional: [] Weight loss  [] Fever  [] Chills Cardiac: [] Chest pain   [] Chest pressure   [] Palpitations   [x] Shortness of breath when laying flat   [x] Shortness of breath with exertion. Vascular:  [] Pain in legs with walking   [x] Pain in legs at rest  [] History of DVT   [] Phlebitis   [x] Swelling in legs   [] Varicose veins   [] Non-healing ulcers Pulmonary:   [x] Uses home oxygen   [] Productive cough   [] Hemoptysis   [] Wheeze  [x] COPD   [] Asthma Neurologic:  [] Dizziness   [] Seizures   [] History of stroke   [] History of TIA  [] Aphasia   [] Vissual changes   [] Weakness or numbness in arm   [] Weakness or numbness in leg Musculoskeletal:   [] Joint swelling   [x] Joint pain   [] Low back pain Hematologic:  [] Easy bruising  [] Easy bleeding   [] Hypercoagulable state   [] Anemic Gastrointestinal:  [] Diarrhea   [] Vomiting  [] Gastroesophageal reflux/heartburn   [] Difficulty swallowing. Genitourinary:  [x] Chronic kidney disease   [] Difficult urination  [] Frequent urination   [] Blood in urine Skin:  [] Rashes   [] Ulcers  Psychological:  [] History of anxiety   []  History of major  depression.  Physical Examination  Vitals:   10/20/17 1014  BP: 131/79  Pulse: 79  Resp: 14  Weight: 131 lb (59.4 kg)  Height: 5\' 2"  (1.575 m)   Body mass index is 23.96 kg/m. Gen: WD/WN, NAD Head: [place]gie/AT, No temporalis wasting.  Ear/Nose/Throat: Hearing grossly intact, nares w/o erythema or drainage Eyes: PER, EOMI, sclera nonicteric.  Neck: Supple, no large masses.   Pulmonary:  Good air movement, no audible wheezing bilaterally, no use of accessory muscles.  Cardiac: RRR, no JVD Vascular: scattered varicosities present bilaterally. Moderate venous stasis changes to the legs bilaterally.  4+ soft pitting edema Vessel Right Left  Radial Palpable Palpable  PT Not Palpable Not Palpable  DP Not Palpable Not Palpable  Gastrointestinal: Non-distended. No guarding/no peritoneal signs.  Musculoskeletal: M/S 5/5 throughout.  No deformity or atrophy.  Neurologic: CN 2-12 intact. Symmetrical.  Speech is fluent. Motor exam  as listed above. Psychiatric: Judgment intact, Mood & affect appropriate for pt's clinical situation. Dermatologic: Venous rashes no ulcers noted at this time.  No changes consistent with cellulitis. Lymph : No lichenification or skin changes of chronic lymphedema.  CBC Lab Results  Component Value Date   WBC 4.2 09/24/2017   HGB 11.1 (L) 09/24/2017   HCT 34.4 (L) 09/24/2017   MCV 97.3 09/24/2017   PLT 124 (L) 09/24/2017    BMET    Component Value Date/Time   NA 133 (L) 09/26/2017 0445   NA 140 04/23/2014 1320   K 4.7 09/26/2017 0445   K 4.8 04/23/2014 1320   CL 94 (L) 09/26/2017 0445   CL 109 (H) 04/23/2014 1320   CO2 32 09/26/2017 0445   CO2 26 04/23/2014 1320   GLUCOSE 87 09/26/2017 0445   GLUCOSE 95 04/23/2014 1320   BUN 20 09/26/2017 0445   BUN 22 (H) 04/23/2014 1320   CREATININE 1.04 (H) 09/26/2017 0445   CREATININE 1.02 04/23/2014 1320   CALCIUM 8.4 (L) 09/26/2017 0445   CALCIUM 8.5 04/23/2014 1320   GFRNONAA 47 (L) 09/26/2017 0445    GFRNONAA 55 (L) 04/23/2014 1320   GFRNONAA 39 (L) 11/02/2013 0540   GFRAA 55 (L) 09/26/2017 0445   GFRAA >60 04/23/2014 1320   GFRAA 45 (L) 11/02/2013 0540   CrCl cannot be calculated (Patient's most recent lab result is older than the maximum 21 days allowed.).  COAG Lab Results  Component Value Date   INR 1.10 07/21/2017   INR 1.23 08/29/2016   INR 1.17 08/28/2016    Radiology Dg Chest 2 View  Result Date: 09/23/2017 CLINICAL DATA:  Shortness of breath EXAM: CHEST - 2 VIEW COMPARISON:  08/25/2017 FINDINGS: Cardiac shadow is enlarged. Defibrillator is noted and stable. The lungs are well aerated bilaterally. Mild chronic interstitial changes are seen. Stable blunting of the costophrenic angles is noted. Chronic changes in the thoracic spine are noted. IMPRESSION: Chronic changes without acute abnormality. Electronically Signed   By: Alcide Clever M.D.   On: 09/23/2017 17:33      Assessment/Plan 1. Lymphedema No surgery or intervention at this point in time.    I have reviewed my discussion with the patient regarding venous insufficiency and secondary lymph edema and why it  causes symptoms. I have discussed with the patient the chronic skin changes that accompany these problems and the long term sequela such as ulceration and infection.  Patient will continue wearing graduated compression stockings class 1 (20-30 mmHg) on a daily basis a prescription was given to the patient to keep this updated. The patient will  put the stockings on first thing in the morning and removing them in the evening. The patient is instructed specifically not to sleep in the stockings.  In addition, behavioral modification including elevation during the day will be continued.  Diet and salt restriction was also discussed.  Following the review of the ultrasound the patient will follow up in 12 months to reassess the degree of swelling and the control that graduated compression is offering.   The patient can  be assessed for a Lymph Pump at that time.  However, at this time the patient states they do not want a pump  2. Chronic venous insufficiency See #1  3. Atherosclerosis of native artery of both lower extremities with intermittent claudication (HCC)  Recommend:  The patient has evidence of atherosclerosis of the lower extremities with claudication.  The patient does not voice lifestyle limiting  changes at this point in time.  Noninvasive studies do not suggest clinically significant change.  No invasive studies, angiography or surgery at this time The patient should continue antiplatelet therapy and aggressive treatment of the lipid abnormalities  No changes in the patient's medications at this time  The patient should continue wearing graduated compression socks 20-30 mmHg strength to control the mild edema.    4. Atrial fibrillation, chronic (HCC) Continue antiarrhythmia medications as already ordered, these medications have been reviewed and there are no changes at this time.  Continue anticoagulation as ordered by Cardiology Service   5. Congestive heart failure, unspecified HF chronicity, unspecified heart failure type (HCC) Continue cardiac and antihypertensive medications as already ordered and reviewed, no changes at this time.  Continue statin as ordered and reviewed, no changes at this time  Nitrates PRN for chest pain   6. Hypertension, unspecified type Continue antihypertensive medications as already ordered, these medications have been reviewed and there are no changes at this time.    Levora Dredge, MD  10/20/2017 10:26 AM

## 2017-10-24 ENCOUNTER — Telehealth (INDEPENDENT_AMBULATORY_CARE_PROVIDER_SITE_OTHER): Payer: Self-pay | Admitting: Vascular Surgery

## 2017-10-24 DIAGNOSIS — K5900 Constipation, unspecified: Secondary | ICD-10-CM | POA: Diagnosis not present

## 2017-10-24 DIAGNOSIS — Z4689 Encounter for fitting and adjustment of other specified devices: Secondary | ICD-10-CM | POA: Diagnosis not present

## 2017-10-24 NOTE — Telephone Encounter (Signed)
Lymphedema pump request sent to [facility]. See note below.

## 2017-10-24 NOTE — Telephone Encounter (Signed)
PATIENT CALLING TO REQUEST THAT WE MOVE FORWARD WITH GETTING A LYMPHEDEMA PUMP.

## 2017-10-25 ENCOUNTER — Encounter: Payer: Self-pay | Admitting: Family

## 2017-10-25 ENCOUNTER — Ambulatory Visit: Payer: Self-pay

## 2017-10-25 ENCOUNTER — Ambulatory Visit: Payer: PPO | Admitting: Family

## 2017-10-25 VITALS — BP 144/96 | HR 81 | Resp 18 | Ht 64.0 in | Wt 133.2 lb

## 2017-10-25 DIAGNOSIS — I509 Heart failure, unspecified: Secondary | ICD-10-CM

## 2017-10-25 DIAGNOSIS — G459 Transient cerebral ischemic attack, unspecified: Secondary | ICD-10-CM | POA: Diagnosis not present

## 2017-10-25 DIAGNOSIS — E569 Vitamin deficiency, unspecified: Secondary | ICD-10-CM | POA: Diagnosis not present

## 2017-10-25 DIAGNOSIS — I890 Lymphedema, not elsewhere classified: Secondary | ICD-10-CM | POA: Insufficient documentation

## 2017-10-25 DIAGNOSIS — I130 Hypertensive heart and chronic kidney disease with heart failure and stage 1 through stage 4 chronic kidney disease, or unspecified chronic kidney disease: Secondary | ICD-10-CM | POA: Diagnosis not present

## 2017-10-25 DIAGNOSIS — I110 Hypertensive heart disease with heart failure: Secondary | ICD-10-CM

## 2017-10-25 DIAGNOSIS — E039 Hypothyroidism, unspecified: Secondary | ICD-10-CM

## 2017-10-25 DIAGNOSIS — E785 Hyperlipidemia, unspecified: Secondary | ICD-10-CM | POA: Diagnosis not present

## 2017-10-25 DIAGNOSIS — Z79899 Other long term (current) drug therapy: Secondary | ICD-10-CM

## 2017-10-25 DIAGNOSIS — R1312 Dysphagia, oropharyngeal phase: Secondary | ICD-10-CM | POA: Diagnosis not present

## 2017-10-25 DIAGNOSIS — I482 Chronic atrial fibrillation, unspecified: Secondary | ICD-10-CM

## 2017-10-25 DIAGNOSIS — I5022 Chronic systolic (congestive) heart failure: Secondary | ICD-10-CM

## 2017-10-25 DIAGNOSIS — I4891 Unspecified atrial fibrillation: Secondary | ICD-10-CM

## 2017-10-25 DIAGNOSIS — Z7401 Bed confinement status: Secondary | ICD-10-CM | POA: Diagnosis not present

## 2017-10-25 DIAGNOSIS — Z881 Allergy status to other antibiotic agents status: Secondary | ICD-10-CM | POA: Insufficient documentation

## 2017-10-25 DIAGNOSIS — R011 Cardiac murmur, unspecified: Secondary | ICD-10-CM | POA: Diagnosis not present

## 2017-10-25 DIAGNOSIS — I63412 Cerebral infarction due to embolism of left middle cerebral artery: Secondary | ICD-10-CM | POA: Diagnosis not present

## 2017-10-25 DIAGNOSIS — R29898 Other symptoms and signs involving the musculoskeletal system: Secondary | ICD-10-CM | POA: Diagnosis not present

## 2017-10-25 DIAGNOSIS — Z8673 Personal history of transient ischemic attack (TIA), and cerebral infarction without residual deficits: Secondary | ICD-10-CM | POA: Diagnosis not present

## 2017-10-25 DIAGNOSIS — D649 Anemia, unspecified: Secondary | ICD-10-CM

## 2017-10-25 DIAGNOSIS — I5023 Acute on chronic systolic (congestive) heart failure: Secondary | ICD-10-CM | POA: Diagnosis not present

## 2017-10-25 DIAGNOSIS — I6389 Other cerebral infarction: Secondary | ICD-10-CM | POA: Diagnosis not present

## 2017-10-25 DIAGNOSIS — K225 Diverticulum of esophagus, acquired: Secondary | ICD-10-CM | POA: Diagnosis not present

## 2017-10-25 DIAGNOSIS — G8191 Hemiplegia, unspecified affecting right dominant side: Secondary | ICD-10-CM | POA: Diagnosis not present

## 2017-10-25 DIAGNOSIS — Z66 Do not resuscitate: Secondary | ICD-10-CM | POA: Diagnosis not present

## 2017-10-25 DIAGNOSIS — M79651 Pain in right thigh: Secondary | ICD-10-CM | POA: Diagnosis not present

## 2017-10-25 DIAGNOSIS — J9621 Acute and chronic respiratory failure with hypoxia: Secondary | ICD-10-CM | POA: Diagnosis not present

## 2017-10-25 DIAGNOSIS — H269 Unspecified cataract: Secondary | ICD-10-CM | POA: Diagnosis not present

## 2017-10-25 DIAGNOSIS — K219 Gastro-esophageal reflux disease without esophagitis: Secondary | ICD-10-CM

## 2017-10-25 DIAGNOSIS — I63422 Cerebral infarction due to embolism of left anterior cerebral artery: Secondary | ICD-10-CM | POA: Diagnosis not present

## 2017-10-25 DIAGNOSIS — M810 Age-related osteoporosis without current pathological fracture: Secondary | ICD-10-CM | POA: Diagnosis not present

## 2017-10-25 DIAGNOSIS — R627 Adult failure to thrive: Secondary | ICD-10-CM | POA: Diagnosis not present

## 2017-10-25 DIAGNOSIS — M7989 Other specified soft tissue disorders: Secondary | ICD-10-CM | POA: Diagnosis not present

## 2017-10-25 DIAGNOSIS — L89513 Pressure ulcer of right ankle, stage 3: Secondary | ICD-10-CM | POA: Diagnosis not present

## 2017-10-25 DIAGNOSIS — I350 Nonrheumatic aortic (valve) stenosis: Secondary | ICD-10-CM | POA: Diagnosis not present

## 2017-10-25 DIAGNOSIS — M6281 Muscle weakness (generalized): Secondary | ICD-10-CM | POA: Diagnosis not present

## 2017-10-25 DIAGNOSIS — I10 Essential (primary) hypertension: Secondary | ICD-10-CM

## 2017-10-25 DIAGNOSIS — K5900 Constipation, unspecified: Secondary | ICD-10-CM | POA: Diagnosis not present

## 2017-10-25 DIAGNOSIS — Z95810 Presence of automatic (implantable) cardiac defibrillator: Secondary | ICD-10-CM

## 2017-10-25 DIAGNOSIS — E43 Unspecified severe protein-calorie malnutrition: Secondary | ICD-10-CM | POA: Diagnosis not present

## 2017-10-25 DIAGNOSIS — I639 Cerebral infarction, unspecified: Secondary | ICD-10-CM | POA: Diagnosis not present

## 2017-10-25 DIAGNOSIS — R531 Weakness: Secondary | ICD-10-CM | POA: Diagnosis not present

## 2017-10-25 DIAGNOSIS — G4700 Insomnia, unspecified: Secondary | ICD-10-CM | POA: Diagnosis not present

## 2017-10-25 DIAGNOSIS — Z6822 Body mass index (BMI) 22.0-22.9, adult: Secondary | ICD-10-CM | POA: Diagnosis not present

## 2017-10-25 DIAGNOSIS — I252 Old myocardial infarction: Secondary | ICD-10-CM | POA: Insufficient documentation

## 2017-10-25 DIAGNOSIS — F329 Major depressive disorder, single episode, unspecified: Secondary | ICD-10-CM | POA: Insufficient documentation

## 2017-10-25 DIAGNOSIS — Z885 Allergy status to narcotic agent status: Secondary | ICD-10-CM

## 2017-10-25 DIAGNOSIS — N183 Chronic kidney disease, stage 3 (moderate): Secondary | ICD-10-CM | POA: Diagnosis not present

## 2017-10-25 DIAGNOSIS — I429 Cardiomyopathy, unspecified: Secondary | ICD-10-CM | POA: Diagnosis not present

## 2017-10-25 DIAGNOSIS — Z90710 Acquired absence of both cervix and uterus: Secondary | ICD-10-CM | POA: Diagnosis not present

## 2017-10-25 DIAGNOSIS — M1990 Unspecified osteoarthritis, unspecified site: Secondary | ICD-10-CM | POA: Diagnosis not present

## 2017-10-25 DIAGNOSIS — I5043 Acute on chronic combined systolic (congestive) and diastolic (congestive) heart failure: Secondary | ICD-10-CM | POA: Diagnosis not present

## 2017-10-25 DIAGNOSIS — Z515 Encounter for palliative care: Secondary | ICD-10-CM | POA: Diagnosis not present

## 2017-10-25 DIAGNOSIS — R4182 Altered mental status, unspecified: Secondary | ICD-10-CM | POA: Diagnosis not present

## 2017-10-25 DIAGNOSIS — I6359 Cerebral infarction due to unspecified occlusion or stenosis of other cerebral artery: Secondary | ICD-10-CM | POA: Diagnosis not present

## 2017-10-25 NOTE — Progress Notes (Signed)
Patient ID: Joanna Roberts, female    DOB: 16-Aug-1928, 84 y.o.   MRN: [id number]  HPI  Ms Guastella is a 84 y/o female with a history of HTN, thyroid disease, MI, anemia, depression, GERD, TIA's and chronic heart failure.   Echo report from 09/24/17 reviewed and showed an EF of 25-30% along with severe MR/TR and trivial AR.   Admitted 09/23/17 due to acute on chronic HF. Cardiology consult obtained. Initially needed IV lasix and then transitioned to oral diuretics. Hypotension limits use of ARB/ACE-I. Discharged after 3 days. Was in the ED 08/25/17 due to weakness where she was treated and released.   She presents today for her initial visit with a chief complaint of chronic fatigue upon minimal exertion. She describes this as chronic in nature having been present for several years. She has associated shortness of breath, chest pain, pedal edema, abdominal pain, light-headedness, depression and chronic difficulty sleeping along with this. She says that she is eating/drinking very little because the food at [facility] tastes salty to her. She says that she's drinking very little fluids and drinks some water when she takes her medicine and very little water the rest of the day. Has occasional coffee and juice. Admits to feeling depressed and feels like she's becoming socially isolated. Her husband died 8 years ago and her son died 5 years ago. Her daughter lives out of town.   Past Medical History:  Diagnosis Date  . AICD (automatic cardioverter/defibrillator) present   . Allergy   . Anemia   . Arthritis   . Cataract   . CHF (congestive heart failure) (HCC)   . Depression   . Dysrhythmia   . GERD (gastroesophageal reflux disease)   . Heart murmur   . History of TIAs   . Hypertension   . Hypothyroidism   . Myocardial infarction (HCC)   . Osteoporosis   . Presence of permanent cardiac pacemaker   . Thyroid disease    Past Surgical History:  Procedure Laterality Date  . ABDOMINAL HYSTERECTOMY     . APPENDECTOMY    . HERNIA REPAIR    . IMPLANTABLE CARDIOVERTER DEFIBRILLATOR (ICD) GENERATOR CHANGE Left 07/27/2017   Procedure: ICD GENERATOR CHANGE;  Surgeon: Marcina Millard, MD;  Location: [facility] ORS;  Service: Cardiovascular;  Laterality: Left;  . INSERT / REPLACE / REMOVE PACEMAKER    . LOWER EXTREMITY ANGIOGRAPHY Left 02/08/2017   Procedure: Lower Extremity Angiography;  Surgeon: Renford Dills, MD;  Location: [facility] INVASIVE CV LAB;  Service: Cardiovascular;  Laterality: Left;  . PERIPHERAL VASCULAR CATHETERIZATION Right 10/08/2014   Procedure: Lower Extremity Angiography;  Surgeon: Renford Dills, MD;  Location: [facility] INVASIVE CV LAB;  Service: Cardiovascular;  Laterality: Right;  . PERIPHERAL VASCULAR CATHETERIZATION  10/08/2014   Procedure: Lower Extremity Intervention;  Surgeon: Renford Dills, MD;  Location: [facility] INVASIVE CV LAB;  Service: Cardiovascular;;  . PERIPHERAL VASCULAR CATHETERIZATION N/A 03/23/2016   Procedure: Visceral Angiography;  Surgeon: Renford Dills, MD;  Location: [facility] INVASIVE CV LAB;  Service: Cardiovascular;  Laterality: N/A;  . pessary     Family History  Problem Relation Age of Onset  . Early death Mother   . Heart disease Mother   . Arthritis Father   . Asthma Father   . Stroke Sister    Social History   Tobacco Use  . Smoking status: Never Smoker  . Smokeless tobacco: Never Used  Substance Use Topics  . Alcohol use: No   Allergies  Allergen Reactions  . Lortab [Hydrocodone-Acetaminophen] Itching, Swelling and Rash  . Doxycycline Diarrhea  . Macrobid [Nitrofurantoin Monohyd Macro] Diarrhea and Nausea And Vomiting  . Nitrofurantoin Diarrhea, Nausea Only and Nausea And Vomiting   Prior to Admission medications   Medication Sig Start Date End Date Taking? Authorizing Provider  acetaminophen (TYLENOL) 500 MG tablet Take 1,000 mg by mouth every 8 (eight) hours as needed for mild pain or headache.   Yes [provider]   carvedilol (COREG) 6.25 MG tablet Take 1 tablet (6.25 mg total) by mouth 2 (two) times daily. 09/26/17  Yes Mody, Patricia Pesa, MD  docusate sodium (COLACE) 100 MG capsule Take 100 mg by mouth 2 (two) times daily.   Yes [provider]  furosemide (LASIX) 20 MG tablet Take 2 tablets (40 mg total) by mouth daily. 09/26/17  Yes Mody, Patricia Pesa, MD  gabapentin (NEURONTIN) 300 MG capsule Take 1 capsule (300 mg total) by mouth 2 (two) times daily. 09/26/17  Yes Mody, Patricia Pesa, MD  levothyroxine (SYNTHROID, LEVOTHROID) 25 MCG tablet TAKE 1 TABLET EVERY DAY ON EMPTY STOMACHWITH A GLASS OF WATER AT LEAST 30-60 MINBEFORE BREAKFAST 11/29/16  Yes [provider]  nitroGLYCERIN (NITROSTAT) 0.4 MG SL tablet Place 0.4 mg under the tongue every 5 (five) minutes as needed for chest pain.  09/22/16  Yes [provider]  temazepam (RESTORIL) 7.5 MG capsule Take 7.5 mg by mouth at bedtime.  10/05/17  Yes [provider]  triamcinolone cream (KENALOG) 0.1 % Apply 1 application topically 2 (two) times daily as needed (for rash/skin irritation.).  04/28/17 04/28/18 Yes [provider]  vitamin B-12 (CYANOCOBALAMIN) 1000 MCG tablet Take 1,000 mcg by mouth daily.   Yes [provider]  levalbuterol (XOPENEX HFA) 45 MCG/ACT inhaler Inhale 1-2 puffs into the lungs every 6 (six) hours as needed for wheezing or shortness of breath.  09/05/17 09/05/18  [provider]   Review of Systems  Constitutional: Positive for appetite change (decreased) and fatigue.  HENT: Positive for congestion. Negative for rhinorrhea and sore throat.   Eyes: Negative.   Respiratory: Positive for shortness of breath. Negative for cough.   Cardiovascular: Positive for chest pain (infrequently) and leg swelling. Negative for palpitations.  Gastrointestinal: Positive for abdominal pain. Negative for abdominal distention.  Endocrine: Negative.   Genitourinary: Negative.   Musculoskeletal: Positive for arthralgias  (right leg pain) and back pain. Negative for neck pain.  Skin: Negative.   Allergic/Immunologic: Negative.   Neurological: Positive for light-headedness (easily). Negative for dizziness and weakness.  Hematological: Negative for adenopathy. Bruises/bleeds easily.  Psychiatric/Behavioral: Positive for dysphoric mood and sleep disturbance (not sleeping well and waking up ~ 3am; ). The patient is not nervous/anxious.        "lots of stress"    Vitals:   10/25/17 1022  BP: (!) 144/96  Pulse: 81  Resp: 18  SpO2: 100%  Weight: 133 lb 4 oz (60.4 kg)  Height: 5\' 4"  (1.626 m)   Wt Readings from Last 3 Encounters:  10/25/17 133 lb 4 oz (60.4 kg)  10/20/17 131 lb (59.4 kg)  10/05/17 137 lb (62.1 kg)   Lab Results  Component Value Date   CREATININE 1.04 (H) 09/26/2017   CREATININE 1.26 (H) 09/25/2017   CREATININE 1.20 (H) 09/24/2017    Physical Exam  Constitutional: She is oriented to person, place, and time. She appears well-developed and well-nourished.  HENT:  Head: Normocephalic and atraumatic.  Neck: Normal range of motion. Neck supple. No  JVD present.  Cardiovascular: Normal rate. An irregular rhythm present.  Pulmonary/Chest: Effort normal. She has no decreased breath sounds. She has no rhonchi. She has no rales.  Abdominal: Soft. She exhibits no distension.  Musculoskeletal:       Right lower leg: She exhibits edema (2+ pitting). She exhibits no tenderness.       Left lower leg: She exhibits edema (2+ pitting). She exhibits no tenderness.  Neurological: She is alert and oriented to person, place, and time.  Skin: Skin is warm and dry.  Psychiatric: She has a normal mood and affect. Her behavior is normal.  Nursing note and vitals reviewed.  Assessment & Plan:  1: Chronic HF with reduced ejection fraction- - NYHA class III - mildly fluid overloaded today with lower extremity edema - weighing daily and she says that her weight has been stable. Instructed to call for an  overnight weight gain of >2 pounds or a weekly weight gain of >5 pounds - advised her to increase her daily fluid intake to 40-60 ounces of fluid daily as she probably isn't drinking enough liquids - she is not adding any salt to her food and has been reading food labels. Encouraged her to closely follow a 2000mg  sodium diet and written dietary information was given to her about this - saw cardiology Lady Gary) 10/04/17 - saw pulmonology Meredeth Ide) 10/18/17 - drinking 1 boost daily and she was reminded that the boost needed to be counted in her daily fluid intake - BNP 09/23/17 was 1688.0 - PharmD reconciled medications with the patient  2: HTN- - BP mildly elevated today but patient admits that she was nervous about coming and was also irritated with [facility] transportation - saw PCP Merlinda Frederick) 10/04/17 - BMP 09/26/17 reviewed and showed sodium 133, potassium 4.7 and GFR 47  3: Atrial fibrillation- - biventricular AICD present and developed hematoma after generator change out  - she says that her chest wall is still quite tender to touch - cardiology note mentions bleeding risk appears to exceed potential benefits of being anticoagulated so currently is not  4: Lymphedema- - saw vascular (Schnier) 10/20/17 - order has been placed by vascular for lymphedema pump - encouraged her to elevate her legs when sitting for long periods of time - has tried wrapping her legs with ACE wraps but says that it doesn't help  Facility medication list was reviewed.  Return here in 6 weeks or sooner for any questions/problems before then.

## 2017-10-25 NOTE — Patient Instructions (Addendum)
Continue weighing daily and call for an overnight weight gain of > 2 pounds or a weekly weight gain of >5 pounds.  Drink between 40- 60 ounces of fluid daily    Low-Sodium Eating Plan Sodium, which is an element that makes up salt, helps you maintain a healthy balance of fluids in your body. Too much sodium can increase your blood pressure and cause fluid and waste to be held in your body. Your health care provider or dietitian may recommend following this plan if you have high blood pressure (hypertension), kidney disease, liver disease, or heart failure. Eating less sodium can help lower your blood pressure, reduce swelling, and protect your heart, liver, and kidneys. What are tips for following this plan? General guidelines  Most people on this plan should limit their sodium intake to 2,000 mg (milligrams) of sodium each day. Reading food labels  The Nutrition Facts label lists the amount of sodium in one serving of the food. If you eat more than one serving, you must multiply the listed amount of sodium by the number of servings.  Choose foods with less than 140 mg of sodium per serving.  Avoid foods with 300 mg of sodium or more per serving. Shopping  Look for lower-sodium products, often labeled as "low-sodium" or "no salt added."  Always check the sodium content even if foods are labeled as "unsalted" or "no salt added".  Buy fresh foods. ? Avoid canned foods and premade or frozen meals. ? Avoid canned, cured, or processed meats  Buy breads that have less than 80 mg of sodium per slice. Cooking  Eat more home-cooked food and less restaurant, buffet, and fast food.  Avoid adding salt when cooking. Use salt-free seasonings or herbs instead of table salt or sea salt. Check with your health care provider or pharmacist before using salt substitutes.  Cook with plant-based oils, such as canola, sunflower, or olive oil. Meal planning  When eating at a restaurant, ask that your  food be prepared with less salt or no salt, if possible.  Avoid foods that contain MSG (monosodium glutamate). MSG is sometimes added to [place] food, bouillon, and some canned foods. What foods are recommended? The items listed may not be a complete list. Talk with your dietitian about what dietary choices are best for you. Grains Low-sodium cereals, including oats, puffed wheat and rice, and shredded wheat. Low-sodium crackers. Unsalted rice. Unsalted pasta. Low-sodium bread. Whole-grain breads and whole-grain pasta. Vegetables Fresh or frozen vegetables. "No salt added" canned vegetables. "No salt added" tomato sauce and paste. Low-sodium or reduced-sodium tomato and vegetable juice. Fruits Fresh, frozen, or canned fruit. Fruit juice. Meats and other protein foods Fresh or frozen (no salt added) meat, poultry, seafood, and fish. Low-sodium canned tuna and salmon. Unsalted nuts. Dried peas, beans, and lentils without added salt. Unsalted canned beans. Eggs. Unsalted nut butters. Dairy Milk. Soy milk. Cheese that is naturally low in sodium, such as ricotta cheese, fresh mozzarella, or Swiss cheese Low-sodium or reduced-sodium cheese. Cream cheese. Yogurt. Fats and oils Unsalted butter. Unsalted margarine with no trans fat. Vegetable oils such as canola or olive oils. Seasonings and other foods Fresh and dried herbs and spices. Salt-free seasonings. Low-sodium mustard and ketchup. Sodium-free salad dressing. Sodium-free light mayonnaise. Fresh or refrigerated horseradish. Lemon juice. Vinegar. Homemade, reduced-sodium, or low-sodium soups. Unsalted popcorn and pretzels. Low-salt or salt-free chips. What foods are not recommended? The items listed may not be a complete list. Talk with your dietitian about what  dietary choices are best for you. Grains Instant hot cereals. Bread stuffing, pancake, and biscuit mixes. Croutons. Seasoned rice or pasta mixes. Noodle soup cups. Boxed or frozen macaroni  and cheese. Regular salted crackers. Self-rising flour. Vegetables Sauerkraut, pickled vegetables, and relishes. Olives. [place] fries. Onion rings. Regular canned vegetables (not low-sodium or reduced-sodium). Regular canned tomato sauce and paste (not low-sodium or reduced-sodium). Regular tomato and vegetable juice (not low-sodium or reduced-sodium). Frozen vegetables in sauces. Meats and other protein foods Meat or fish that is salted, canned, smoked, spiced, or pickled. Bacon, ham, sausage, hotdogs, corned beef, chipped beef, packaged lunch meats, salt pork, jerky, pickled herring, anchovies, regular canned tuna, sardines, salted nuts. Dairy Processed cheese and cheese spreads. Cheese curds. Blue cheese. Feta cheese. String cheese. Regular cottage cheese. Buttermilk. Canned milk. Fats and oils Salted butter. Regular margarine. Ghee. Bacon fat. Seasonings and other foods Onion salt, garlic salt, seasoned salt, table salt, and sea salt. Canned and packaged gravies. Worcestershire sauce. Tartar sauce. Barbecue sauce. Teriyaki sauce. Soy sauce, including reduced-sodium. Steak sauce. Fish sauce. Oyster sauce. Cocktail sauce. Horseradish that you find on the shelf. Regular ketchup and mustard. Meat flavorings and tenderizers. Bouillon cubes. Hot sauce and Tabasco sauce. Premade or packaged marinades. Premade or packaged taco seasonings. Relishes. Regular salad dressings. Salsa. Potato and tortilla chips. Corn chips and puffs. Salted popcorn and pretzels. Canned or dried soups. Pizza. Frozen entrees and pot pies. Summary  Eating less sodium can help lower your blood pressure, reduce swelling, and protect your heart, liver, and kidneys.  Most people on this plan should limit their sodium intake to 1,500-2,000 mg (milligrams) of sodium each day.  Canned, boxed, and frozen foods are high in sodium. Restaurant foods, fast foods, and pizza are also very high in sodium. You also get sodium by adding salt to  food.  Try to cook at home, eat more fresh fruits and vegetables, and eat less fast food, canned, processed, or prepared foods. This information is not intended to replace advice given to you by your health care provider. Make sure you discuss any questions you have with your health care provider. Document Released: 10/02/2001 Document Revised: 04/05/2016 Document Reviewed: 04/05/2016 Elsevier Interactive Patient Education  [employer].

## 2017-10-26 ENCOUNTER — Emergency Department: Payer: PPO

## 2017-10-26 ENCOUNTER — Inpatient Hospital Stay
Admission: EM | Admit: 2017-10-26 | Discharge: 2017-11-02 | DRG: 064 | Disposition: A | Discharge status: Skilled Nursing Facility | Payer: PPO | Attending: Internal Medicine | Admitting: Internal Medicine

## 2017-10-26 ENCOUNTER — Encounter: Payer: Self-pay | Admitting: Family

## 2017-10-26 ENCOUNTER — Observation Stay: Admit: 2017-10-26 | Payer: PPO

## 2017-10-26 ENCOUNTER — Other Ambulatory Visit: Payer: Self-pay

## 2017-10-26 ENCOUNTER — Observation Stay: Payer: PPO

## 2017-10-26 DIAGNOSIS — K760 Fatty (change of) liver, not elsewhere classified: Secondary | ICD-10-CM | POA: Diagnosis present

## 2017-10-26 DIAGNOSIS — I5022 Chronic systolic (congestive) heart failure: Secondary | ICD-10-CM | POA: Diagnosis present

## 2017-10-26 DIAGNOSIS — E43 Unspecified severe protein-calorie malnutrition: Secondary | ICD-10-CM | POA: Diagnosis present

## 2017-10-26 DIAGNOSIS — I63422 Cerebral infarction due to embolism of left anterior cerebral artery: Principal | ICD-10-CM | POA: Diagnosis present

## 2017-10-26 DIAGNOSIS — Z86010 Personal history of colonic polyps: Secondary | ICD-10-CM

## 2017-10-26 DIAGNOSIS — I252 Old myocardial infarction: Secondary | ICD-10-CM

## 2017-10-26 DIAGNOSIS — G459 Transient cerebral ischemic attack, unspecified: Secondary | ICD-10-CM

## 2017-10-26 DIAGNOSIS — G8191 Hemiplegia, unspecified affecting right dominant side: Secondary | ICD-10-CM | POA: Diagnosis present

## 2017-10-26 DIAGNOSIS — L819 Disorder of pigmentation, unspecified: Secondary | ICD-10-CM

## 2017-10-26 DIAGNOSIS — Z90710 Acquired absence of both cervix and uterus: Secondary | ICD-10-CM

## 2017-10-26 DIAGNOSIS — Z885 Allergy status to narcotic agent status: Secondary | ICD-10-CM

## 2017-10-26 DIAGNOSIS — M1990 Unspecified osteoarthritis, unspecified site: Secondary | ICD-10-CM | POA: Diagnosis present

## 2017-10-26 DIAGNOSIS — R29719 NIHSS score 19: Secondary | ICD-10-CM | POA: Diagnosis not present

## 2017-10-26 DIAGNOSIS — R627 Adult failure to thrive: Secondary | ICD-10-CM

## 2017-10-26 DIAGNOSIS — R531 Weakness: Secondary | ICD-10-CM

## 2017-10-26 DIAGNOSIS — Z79890 Hormone replacement therapy (postmenopausal): Secondary | ICD-10-CM

## 2017-10-26 DIAGNOSIS — Z8261 Family history of arthritis: Secondary | ICD-10-CM

## 2017-10-26 DIAGNOSIS — E039 Hypothyroidism, unspecified: Secondary | ICD-10-CM | POA: Diagnosis not present

## 2017-10-26 DIAGNOSIS — Z8249 Family history of ischemic heart disease and other diseases of the circulatory system: Secondary | ICD-10-CM

## 2017-10-26 DIAGNOSIS — Z825 Family history of asthma and other chronic lower respiratory diseases: Secondary | ICD-10-CM

## 2017-10-26 DIAGNOSIS — Z823 Family history of stroke: Secondary | ICD-10-CM

## 2017-10-26 DIAGNOSIS — I350 Nonrheumatic aortic (valve) stenosis: Secondary | ICD-10-CM | POA: Diagnosis present

## 2017-10-26 DIAGNOSIS — R29898 Other symptoms and signs involving the musculoskeletal system: Secondary | ICD-10-CM | POA: Diagnosis not present

## 2017-10-26 DIAGNOSIS — I5023 Acute on chronic systolic (congestive) heart failure: Secondary | ICD-10-CM | POA: Diagnosis not present

## 2017-10-26 DIAGNOSIS — R29810 Facial weakness: Secondary | ICD-10-CM | POA: Diagnosis present

## 2017-10-26 DIAGNOSIS — R011 Cardiac murmur, unspecified: Secondary | ICD-10-CM | POA: Diagnosis present

## 2017-10-26 DIAGNOSIS — K225 Diverticulum of esophagus, acquired: Secondary | ICD-10-CM | POA: Diagnosis present

## 2017-10-26 DIAGNOSIS — L8990 Pressure ulcer of unspecified site, unspecified stage: Secondary | ICD-10-CM

## 2017-10-26 DIAGNOSIS — Z6822 Body mass index (BMI) 22.0-22.9, adult: Secondary | ICD-10-CM

## 2017-10-26 DIAGNOSIS — Z79899 Other long term (current) drug therapy: Secondary | ICD-10-CM

## 2017-10-26 DIAGNOSIS — R197 Diarrhea, unspecified: Secondary | ICD-10-CM | POA: Diagnosis not present

## 2017-10-26 DIAGNOSIS — R471 Dysarthria and anarthria: Secondary | ICD-10-CM | POA: Diagnosis present

## 2017-10-26 DIAGNOSIS — Z8673 Personal history of transient ischemic attack (TIA), and cerebral infarction without residual deficits: Secondary | ICD-10-CM

## 2017-10-26 DIAGNOSIS — Z888 Allergy status to other drugs, medicaments and biological substances status: Secondary | ICD-10-CM

## 2017-10-26 DIAGNOSIS — O2230 Deep phlebothrombosis in pregnancy, unspecified trimester: Secondary | ICD-10-CM

## 2017-10-26 DIAGNOSIS — Z881 Allergy status to other antibiotic agents status: Secondary | ICD-10-CM

## 2017-10-26 DIAGNOSIS — I8390 Asymptomatic varicose veins of unspecified lower extremity: Secondary | ICD-10-CM | POA: Diagnosis present

## 2017-10-26 DIAGNOSIS — Z95810 Presence of automatic (implantable) cardiac defibrillator: Secondary | ICD-10-CM

## 2017-10-26 DIAGNOSIS — I890 Lymphedema, not elsewhere classified: Secondary | ICD-10-CM | POA: Diagnosis present

## 2017-10-26 DIAGNOSIS — K219 Gastro-esophageal reflux disease without esophagitis: Secondary | ICD-10-CM | POA: Diagnosis present

## 2017-10-26 DIAGNOSIS — R29707 NIHSS score 7: Secondary | ICD-10-CM | POA: Diagnosis present

## 2017-10-26 DIAGNOSIS — I429 Cardiomyopathy, unspecified: Secondary | ICD-10-CM | POA: Diagnosis present

## 2017-10-26 DIAGNOSIS — R2990 Unspecified symptoms and signs involving the nervous system: Secondary | ICD-10-CM

## 2017-10-26 DIAGNOSIS — M810 Age-related osteoporosis without current pathological fracture: Secondary | ICD-10-CM | POA: Diagnosis present

## 2017-10-26 DIAGNOSIS — R4781 Slurred speech: Secondary | ICD-10-CM | POA: Diagnosis present

## 2017-10-26 DIAGNOSIS — L89513 Pressure ulcer of right ankle, stage 3: Secondary | ICD-10-CM | POA: Diagnosis not present

## 2017-10-26 DIAGNOSIS — H269 Unspecified cataract: Secondary | ICD-10-CM | POA: Diagnosis present

## 2017-10-26 DIAGNOSIS — M7989 Other specified soft tissue disorders: Secondary | ICD-10-CM

## 2017-10-26 DIAGNOSIS — I110 Hypertensive heart disease with heart failure: Secondary | ICD-10-CM | POA: Diagnosis present

## 2017-10-26 DIAGNOSIS — Z66 Do not resuscitate: Secondary | ICD-10-CM | POA: Diagnosis present

## 2017-10-26 DIAGNOSIS — Z7952 Long term (current) use of systemic steroids: Secondary | ICD-10-CM

## 2017-10-26 DIAGNOSIS — E538 Deficiency of other specified B group vitamins: Secondary | ICD-10-CM | POA: Diagnosis present

## 2017-10-26 DIAGNOSIS — I482 Chronic atrial fibrillation: Secondary | ICD-10-CM | POA: Diagnosis present

## 2017-10-26 DIAGNOSIS — Z515 Encounter for palliative care: Secondary | ICD-10-CM

## 2017-10-26 DIAGNOSIS — F329 Major depressive disorder, single episode, unspecified: Secondary | ICD-10-CM | POA: Diagnosis present

## 2017-10-26 LAB — CBC WITH DIFFERENTIAL/PLATELET
BASOS ABS: 0 10*3/uL (ref 0–0.1)
BASOS PCT: 1 %
EOS ABS: 0.1 10*3/uL (ref 0–0.7)
Eosinophils Relative: 2 %
HCT: 33.1 % — ABNORMAL LOW (ref 35.0–47.0)
HEMOGLOBIN: 10.7 g/dL — AB (ref 12.0–16.0)
LYMPHS ABS: 0.4 10*3/uL — AB (ref 1.0–3.6)
Lymphocytes Relative: 11 %
MCH: 31.3 pg (ref 26.0–34.0)
MCHC: 32.4 g/dL (ref 32.0–36.0)
MCV: 96.6 fL (ref 80.0–100.0)
Monocytes Absolute: 0.6 10*3/uL (ref 0.2–0.9)
Monocytes Relative: 17 %
Neutro Abs: 2.6 10*3/uL (ref 1.4–6.5)
Neutrophils Relative %: 69 %
Platelets: 125 10*3/uL — ABNORMAL LOW (ref 150–440)
RBC: 3.43 MIL/uL — AB (ref 3.80–5.20)
RDW: 16.2 % — ABNORMAL HIGH (ref 11.5–14.5)
WBC: 3.7 10*3/uL (ref 3.6–11.0)

## 2017-10-26 LAB — COMPREHENSIVE METABOLIC PANEL
ALBUMIN: 3.6 g/dL (ref 3.5–5.0)
ALK PHOS: 45 U/L (ref 38–126)
ALT: 16 U/L (ref 0–44)
AST: 23 U/L (ref 15–41)
Anion gap: 6 (ref 5–15)
BUN: 26 mg/dL — AB (ref 8–23)
CALCIUM: 9.2 mg/dL (ref 8.9–10.3)
CHLORIDE: 92 mmol/L — AB (ref 98–111)
CO2: 33 mmol/L — AB (ref 22–32)
CREATININE: 0.93 mg/dL (ref 0.44–1.00)
GFR calc Af Amer: >60 mL/min (ref >60)
GFR calc non Af Amer: 54 mL/min — ABNORMAL LOW (ref >60)
GLUCOSE: 111 mg/dL — AB (ref 70–99)
Potassium: 4.3 mmol/L (ref 3.5–5.1)
Sodium: 131 mmol/L — ABNORMAL LOW (ref 135–145)
Total Bilirubin: 1.7 mg/dL — ABNORMAL HIGH (ref 0.3–1.2)
Total Protein: 6.6 g/dL (ref 6.5–8.1)

## 2017-10-26 LAB — URINALYSIS, COMPLETE (UACMP) WITH MICROSCOPIC
BILIRUBIN URINE: NEGATIVE
Glucose, UA: NEGATIVE mg/dL
Hgb urine dipstick: NEGATIVE
Ketones, ur: NEGATIVE mg/dL
Nitrite: NEGATIVE
PROTEIN: NEGATIVE mg/dL
SPECIFIC GRAVITY, URINE: 1.014 (ref 1.005–1.030)
pH: 6 (ref 5.0–8.0)

## 2017-10-26 LAB — TROPONIN I: Troponin I: 0.03 ng/mL (ref <0.03)

## 2017-10-26 MED ORDER — DOCUSATE SODIUM 100 MG PO CAPS
100.0000 mg | ORAL_CAPSULE | Freq: Two times a day (BID) | ORAL | Status: DC
  Administered 2017-10-26 – 2017-10-29 (×6): 100 mg via ORAL
  Filled 2017-10-26 (×6): qty 1

## 2017-10-26 MED ORDER — OXYCODONE-ACETAMINOPHEN 5-325 MG PO TABS
1.0000 | ORAL_TABLET | Freq: Four times a day (QID) | ORAL | Status: DC | PRN
  Administered 2017-10-26 – 2017-10-29 (×2): 1 via ORAL
  Filled 2017-10-26 (×3): qty 1

## 2017-10-26 MED ORDER — LEVOTHYROXINE SODIUM 25 MCG PO TABS
25.0000 ug | ORAL_TABLET | Freq: Every day | ORAL | Status: DC
  Administered 2017-10-27 – 2017-10-29 (×3): 25 ug via ORAL
  Filled 2017-10-26 (×3): qty 1

## 2017-10-26 MED ORDER — NITROGLYCERIN 0.4 MG SL SUBL: 0.4000 mg | SUBLINGUAL_TABLET | SUBLINGUAL | Status: DC | PRN

## 2017-10-26 MED ORDER — CARVEDILOL 6.25 MG PO TABS
6.2500 mg | ORAL_TABLET | Freq: Two times a day (BID) | ORAL | Status: DC
  Administered 2017-10-26 – 2017-10-29 (×4): 6.25 mg via ORAL
  Filled 2017-10-26: qty 2
  Filled 2017-10-26: qty 1
  Filled 2017-10-26: qty 2
  Filled 2017-10-26 (×4): qty 1
  Filled 2017-10-26: qty 2
  Filled 2017-10-26: qty 1
  Filled 2017-10-26: qty 2
  Filled 2017-10-26 (×3): qty 1

## 2017-10-26 MED ORDER — STROKE: EARLY STAGES OF RECOVERY BOOK
Freq: Once | Status: AC
  Administered 2017-10-26: 18:00:00

## 2017-10-26 MED ORDER — FUROSEMIDE 40 MG PO TABS
40.0000 mg | ORAL_TABLET | Freq: Every day | ORAL | Status: DC
  Administered 2017-10-26 – 2017-10-29 (×4): 40 mg via ORAL
  Filled 2017-10-26 (×4): qty 1

## 2017-10-26 MED ORDER — TEMAZEPAM 7.5 MG PO CAPS
7.5000 mg | ORAL_CAPSULE | Freq: Every day | ORAL | Status: DC
  Administered 2017-10-26 – 2017-11-01 (×5): 7.5 mg via ORAL
  Filled 2017-10-26 (×5): qty 1

## 2017-10-26 MED ORDER — ASPIRIN 81 MG PO CHEW
324.0000 mg | CHEWABLE_TABLET | Freq: Once | ORAL | Status: AC
  Administered 2017-10-26: 324 mg via ORAL
  Filled 2017-10-26: qty 4

## 2017-10-26 MED ORDER — GABAPENTIN 300 MG PO CAPS
300.0000 mg | ORAL_CAPSULE | Freq: Two times a day (BID) | ORAL | Status: DC
  Administered 2017-10-26 – 2017-10-29 (×6): 300 mg via ORAL
  Filled 2017-10-26 (×6): qty 1

## 2017-10-26 MED ORDER — ACETAMINOPHEN 325 MG PO TABS
650.0000 mg | ORAL_TABLET | Freq: Four times a day (QID) | ORAL | Status: DC | PRN
Start: 2017-10-26 — End: 2017-11-02
  Administered 2017-10-31 – 2017-11-02 (×4): 650 mg via ORAL
  Filled 2017-10-26 (×4): qty 2

## 2017-10-26 MED ORDER — LEVALBUTEROL HCL 0.63 MG/3ML IN NEBU: 0.6300 mg | INHALATION_SOLUTION | Freq: Four times a day (QID) | RESPIRATORY_TRACT | Status: DC | PRN

## 2017-10-26 MED ORDER — VITAMIN B-12 1000 MCG PO TABS
1000.0000 ug | ORAL_TABLET | Freq: Every day | ORAL | Status: DC
  Administered 2017-10-26 – 2017-10-29 (×4): 1000 ug via ORAL
  Filled 2017-10-26 (×4): qty 1

## 2017-10-26 MED ORDER — ENOXAPARIN SODIUM 40 MG/0.4ML ~~LOC~~ SOLN
40.0000 mg | SUBCUTANEOUS | Status: DC
  Administered 2017-10-26 – 2017-11-01 (×7): 40 mg via SUBCUTANEOUS
  Filled 2017-10-26 (×7): qty 0.4

## 2017-10-26 NOTE — Evaluation (Signed)
Occupational Therapy Evaluation Patient Details Name: Joanna Roberts MRN: [id number] DOB: [birth year] Today's Date: 10/26/2017    History of Present Illness 84 y.o. female with a history of anemia, arthritis, CHF (EF 25-30%), GERD, hypertension, hypothyroidism, MI, on 2L O2 at baseline, who presents to the ED for right leg weakness that started this am and was unable to walk.  Patient arrives from [facility] [place].  No facial droop or right arm symptoms.  She was last known normal when she went to bed last night.  She denies any recent illness or other complaints.    Clinical Impression   Pt seen for OT evaluation this date. Prior to hospital admission, pt was modified independent in all aspects of ADL (however endorses increasing difficulty performing), does not drive, and using a rollator for all mobility. Pt endorses 1 fall (slid to floor) resulting in a R shoulder injury which she hasn't recovered from and now has very limited R shoulder flexion ROM (~0-30 deg). Pt [place] in [place] apartment at [facility]. Pt currently demonstrates deficits in sitting balance with heavy R lateral and posterior lean requiring mod-max assist and max verbal cues to attempt to correct, very little muscle activation in her RLE, decreased strength in RUE (unclear whether related to R side weakness versus previous R shoulder injury limiting her ability to functionally use her RUE leading to weakness), and impaired activity tolerance. No cognitive, visual, or sensory deficits appreciated. Pt denies speech difficulties. Pt currently requires max assist for LB ADL tasks and mod-max for UB ADL tasks. Pt will benefit from skilled OT services to address noted impairments and functional impairments in order to maximize return to PLOF and minimize risk of future falls and need for increased caregiver burden or long term higher level of care. Recommend transition to STR for additional rehabilitation upon discharge.     Follow Up  Recommendations  SNF    Equipment Recommendations  Other (comment)(TBD)    Recommendations for Other Services       Precautions / Restrictions Precautions Precautions: Fall Restrictions Weight Bearing Restrictions: No      Mobility Bed Mobility Overal bed mobility: Needs Assistance Bed Mobility: Supine to Sit;Sit to Supine     Supine to sit: Mod assist;Max assist;HOB elevated Sit to supine: Max assist   General bed mobility comments: RLE mgt and trunk elevation with significant R lateral and posterior lean  Transfers                 General transfer comment: unsafe to attempt    Balance Overall balance assessment: Needs assistance Sitting-balance support: Single extremity supported;Feet supported Sitting balance-Leahy Scale: Poor Sitting balance - Comments: requires mod to max assist and max verbal cues to correct R lat and post lean; pusher?  Postural control: Right lateral lean;Posterior lean                                 ADL either performed or assessed with clinical judgement   ADL Overall ADL's : Needs assistance/impaired Eating/Feeding: Bed level;Set up Eating/Feeding Details (indicate cue type and reason): using non-dom hand primarily (since Nov) Grooming: Bed level;Set up   Upper Body Bathing: Bed level;Moderate assistance;Maximal assistance   Lower Body Bathing: Bed level;Maximal assistance   Upper Body Dressing : Bed level;Moderate assistance;Maximal assistance   Lower Body Dressing: Bed level;Maximal assistance  Vision Baseline Vision/History: Wears glasses Wears Glasses: At all times Patient Visual Report: No change from baseline Vision Assessment?: No apparent visual deficits     Perception     Praxis      Pertinent Vitals/Pain Pain Assessment: Faces Faces Pain Scale: Hurts little more Pain Location: R hip in sitting (2/2 arthritis, per pt report) Pain Intervention(s): Limited  activity within patient's tolerance;Monitored during session;Repositioned     Hand Dominance Right   Extremity/Trunk Assessment Upper Extremity Assessment Upper Extremity Assessment: RUE deficits/detail;LUE deficits/detail RUE Deficits / Details: old shoulder injury now with limited functional use, shoulder flexion 0-30 degrees, elbow flex/ext and grip 3+/5, intact sensation RUE Sensation: WNL RUE Coordination: decreased gross motor LUE Deficits / Details: grossly 4/5, sensation intact LUE Sensation: WNL LUE Coordination: WNL   Lower Extremity Assessment Lower Extremity Assessment: RLE deficits/detail;Defer to PT evaluation;LLE deficits/detail RLE Deficits / Details: can wiggle toes, 0/5 to trace muscle activation DF, knee ext/flex, and hip flex/abduction. intact sensation. RLE Sensation: WNL RLE Coordination: decreased fine motor;decreased gross motor LLE Deficits / Details: grossly at least 4-/5  LLE Sensation: WNL LLE Coordination: WNL   Cervical / Trunk Assessment Cervical / Trunk Assessment: Normal   Communication Communication Communication: No difficulties   Cognition Arousal/Alertness: Awake/alert Behavior During Therapy: WFL for tasks assessed/performed Overall Cognitive Status: Within Functional Limits for tasks assessed                                 General Comments: A&Ox4, follows all commands, pleasant   General Comments  bruising noted on anterior aspect of forearm of RUE    Exercises     Shoulder Instructions      Home Living Family/patient expects to be discharged to:: Other (Comment)                                 Additional Comments: [place]      Prior Functioning/Environment Level of Independence: Independent with assistive device(s)        Comments: Pt amb w/ rollator at all times, modified indep with ADL requiring additional time/effort to perform with increasing difficulty, per pt report.  Endorses 1 fall in November ("slid to the floor") where she injured her R shoulder and hasn't been able to use normally since.         OT Problem List: Decreased strength;Decreased knowledge of use of DME or AE;Increased edema;Decreased range of motion;Decreased coordination;Decreased activity tolerance;Impaired UE functional use;Pain;Impaired balance (sitting and/or standing)      OT Treatment/Interventions: Self-care/ADL training;Balance training;Therapeutic exercise;Neuromuscular education;Therapeutic activities;DME and/or AE instruction;Energy conservation;Patient/family education    OT Goals(Current goals can be found in the care plan section) Acute Rehab OT Goals Patient Stated Goal: to get better and return home OT Goal Formulation: With patient Time For Goal Achievement: 11/09/17 Potential to Achieve Goals: Good ADL Goals Pt Will Perform Lower Body Dressing: with adaptive equipment;with mod assist;with min assist;sitting/lateral leans Pt Will Transfer to Toilet: bedside commode;with mod assist;squat pivot transfer(LRAD) Additional ADL Goal #1: Pt will sit EOB for grooming tasks for >5 minutes requiring only min assist and PRN verbal cues for sitting balance to minimize R lateral lean.  OT Frequency: Min 2X/week   Barriers to D/C:            Co-evaluation  AM-PAC PT "6 Clicks" Daily Activity     Outcome Measure Help from another person eating meals?: A Little Help from another person taking care of personal grooming?: A Little Help from another person toileting, which includes using toliet, bedpan, or urinal?: A Lot Help from another person bathing (including washing, rinsing, drying)?: A Lot Help from another person to put on and taking off regular upper body clothing?: A Lot Help from another person to put on and taking off regular lower body clothing?: A Lot 6 Click Score: 14   End of Session    Activity Tolerance: Patient tolerated treatment  well Patient left: in bed;with call bell/phone within reach  OT Visit Diagnosis: Other abnormalities of gait and mobility (R26.89);History of falling (Z91.81);Muscle weakness (generalized) (M62.81);Hemiplegia and hemiparesis;Pain Hemiplegia - Right/Left: Right Hemiplegia - dominant/non-dominant: Dominant Hemiplegia - caused by: Unspecified Pain - Right/Left: Right Pain - part of body: Hip                Time: 1557-1620 OT Time Calculation (min): 23 min Charges:  OT General Charges $OT Visit: 1 Visit OT Evaluation $OT Eval Moderate Complexity: 1 Mod  Richrd Prime, MPH, MS, OTR/L ascom [phone] 10/26/17, 4:41 PM

## 2017-10-26 NOTE — ED Provider Notes (Addendum)
[facility] Emergency Department Provider Note       Time seen: ----------------------------------------- 10:21 AM on 10/26/2017 -----------------------------------------   I have reviewed the triage vital signs and the nursing notes.  HISTORY   Chief Complaint Extremity Weakness    HPI Joanna Roberts is a 84 y.o. female with a history of anemia, arthritis, CHF, GERD, hypertension, hypothyroidism, MI who presents to the ED for right leg weakness.  Patient [facility] from [facility] with right leg weakness that started this morning.  She has not had any facial droop or right arm symptoms.  She was last known normal when she went to bed last night.  She denies any recent illness or other complaints.  She was unable to walk this morning.  Past Medical History:  Diagnosis Date  . AICD (automatic cardioverter/defibrillator) present   . Allergy   . Anemia   . Arthritis   . Cataract   . CHF (congestive heart failure) (HCC)   . Depression   . Dysrhythmia   . GERD (gastroesophageal reflux disease)   . Heart murmur   . History of TIAs   . Hypertension   . Hypothyroidism   . Myocardial infarction (HCC)   . Osteoporosis   . Presence of permanent cardiac pacemaker   . Thyroid disease     Patient Active Problem List   Diagnosis Date Noted  . CHF (congestive heart failure) (HCC) 10/20/2017  . Acute on chronic combined systolic and diastolic CHF (congestive heart failure) (HCC) 09/23/2017  . HTN (hypertension) 09/23/2017  . Ankle ulcer (HCC) 01/31/2017  . Carotid stenosis 01/31/2017  . Atherosclerosis of native arteries of the extremities with ulceration (HCC) 01/02/2017  . Varicose veins of both lower extremities 09/30/2016  . Lymphedema 07/29/2016  . Protein-calorie malnutrition, severe 06/03/2016  . Sepsis (HCC) 06/01/2016  . Acute blood loss anemia 04/01/2016  . Hematoma of leg, right, initial encounter 03/23/2016  . Chronic mesenteric ischemia (HCC)  03/15/2016  . Atherosclerosis of native arteries of extremity with intermittent claudication (HCC) 02/12/2016  . Chronic venous insufficiency 02/12/2016  . DDD (degenerative disc disease), lumbar 11/18/2015  . Facet syndrome, lumbar 11/18/2015  . Sacroiliac joint dysfunction 11/18/2015  . Spinal stenosis, lumbar region, with neurogenic claudication 11/18/2015  . Lumbar radiculopathy 11/18/2015  . Compression fracture of thoracic spine, non-traumatic (HCC) 11/18/2015  . Idiopathic scoliosis 11/18/2015  . H/O varicose vein stripping 11/18/2015  . Acquired hypothyroidism 09/17/2015  . Cardiomyopathy (HCC) 09/17/2015  . Back pain, chronic 09/17/2015  . DD (diverticular disease) 09/17/2015  . Aggrieved 09/17/2015  . 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 History of colon polyps 09/17/2015  . Personal history of urinary infection 09/17/2015  . Cannot sleep 09/17/2015  . GERD (gastroesophageal reflux disease) 09/17/2015  . Tachycardia-bradycardia (HCC) 09/17/2015  . Atrial fibrillation, chronic (HCC) 03/18/2015  . Pneumonia 03/11/2015  . B12 deficiency 11/27/2014  . Chronic anemia 11/26/2014  . Incomplete bladder emptying 06/24/2014  . Frequent UTI 06/24/2014  . History of urinary anomaly 06/21/2014  . FOM (frequency of micturition) 06/21/2014  . Chronic abdominal pain 01/17/2014  . CN (constipation) 01/17/2014  . Fatty infiltration of liver 01/17/2014  . Pain in rib 11/06/2013  . Temporary cerebral vascular dysfunction 11/06/2013    Past Surgical History:  Procedure Laterality Date  . ABDOMINAL HYSTERECTOMY    . APPENDECTOMY    . HERNIA REPAIR    . IMPLANTABLE CARDIOVERTER DEFIBRILLATOR (ICD) GENERATOR CHANGE Left 07/27/2017   Procedure: ICD GENERATOR CHANGE;  Surgeon: Darrold Junker,  Lyn Hollingshead, MD;  Location: [facility] ORS;  Service: Cardiovascular;  Laterality: Left;  . INSERT / REPLACE / REMOVE PACEMAKER    . LOWER EXTREMITY ANGIOGRAPHY Left 02/08/2017   Procedure: Lower Extremity  Angiography;  Surgeon: Renford Dills, MD;  Location: [facility] INVASIVE CV LAB;  Service: Cardiovascular;  Laterality: Left;  . PERIPHERAL VASCULAR CATHETERIZATION Right 10/08/2014   Procedure: Lower Extremity Angiography;  Surgeon: Renford Dills, MD;  Location: [facility] INVASIVE CV LAB;  Service: Cardiovascular;  Laterality: Right;  . PERIPHERAL VASCULAR CATHETERIZATION  10/08/2014   Procedure: Lower Extremity Intervention;  Surgeon: Renford Dills, MD;  Location: [facility] INVASIVE CV LAB;  Service: Cardiovascular;;  . PERIPHERAL VASCULAR CATHETERIZATION N/A 03/23/2016   Procedure: Visceral Angiography;  Surgeon: Renford Dills, MD;  Location: [facility] INVASIVE CV LAB;  Service: Cardiovascular;  Laterality: N/A;  . pessary      Allergies Lortab [hydrocodone-acetaminophen]; Doxycycline; Macrobid [nitrofurantoin monohyd macro]; and Nitrofurantoin  Social History Social History   Tobacco Use  . Smoking status: Never Smoker  . Smokeless tobacco: Never Used  Substance Use Topics  . Alcohol use: No  . Drug use: No    Review of Systems Constitutional: Negative for fever. Cardiovascular: Negative for chest pain. Respiratory: Negative for shortness of breath. Gastrointestinal: Negative for abdominal pain, vomiting and diarrhea. Musculoskeletal: Positive for right leg weakness Skin: Negative for rash. Neurological: Negative for headaches, focal weakness or numbness.  All systems negative/normal/unremarkable except as stated in the HPI  ____________________________________________   PHYSICAL EXAM:  VITAL SIGNS: ED Triage Vitals  Enc Vitals Group     BP --      Pulse Rate 10/26/17 1004 (!) 112     Resp 10/26/17 1004 18     Temp 10/26/17 1004 98.1 F (36.7 C)     Temp Source 10/26/17 1004 Oral     SpO2 10/26/17 1004 100 %     Weight 10/26/17 1002 133 lb 2.5 oz (60.4 kg)     Height --      Head Circumference --      Peak Flow --      Pain Score 10/26/17 1002 0     Pain Loc --       Pain Edu? --      Excl. in GC? --    Constitutional: Alert and oriented. Well appearing and in no distress. Eyes: Conjunctivae are normal. Normal extraocular movements. ENT   Head: Normocephalic and atraumatic.   Nose: No congestion/rhinnorhea.   Mouth/Throat: Mucous membranes are moist.   Neck: No stridor. Cardiovascular: Normal rate, regular rhythm. No murmurs, rubs, or gallops. Respiratory: Normal respiratory effort without tachypnea nor retractions. Breath sounds are clear and equal bilaterally. No wheezes/rales/rhonchi. Gastrointestinal: Soft and nontender. Normal bowel sounds Musculoskeletal: Unremarkable but limited range of motion of the extremities, bilateral extensive edema is noted.  Patient states this is chronic Neurologic:  Normal speech and language. No gross focal neurologic deficits are appreciated.  Right leg weakness compared to left, cranial nerves and upper extremity exam is normal.  Normal sensation. Skin:  Skin is warm, dry and intact. No rash noted. Psychiatric: Mood and affect are normal. Speech and behavior are normal.  ____________________________________________  EKG: Interpreted by me.  Ventricular paced rhythm with a rate of 80 bpm, normal pacemaker function is noted  ____________________________________________  ED COURSE:  As part of my medical decision making, I reviewed the following data within the electronic MEDICAL RECORD NUMBER History obtained from family if available, nursing notes, old  chart and ekg, as well as notes from prior ED visits. Patient presented for right leg weakness, we will assess with labs and imaging as indicated at this time.   Procedures ____________________________________________   LABS (pertinent positives/negatives)  Labs Reviewed  CBC WITH DIFFERENTIAL/PLATELET - Abnormal; Notable for the following components:      Result Value   RBC 3.43 (*)    Hemoglobin 10.7 (*)    HCT 33.1 (*)    RDW 16.2 (*)     Platelets 125 (*)    Lymphs Abs 0.4 (*)    All other components within normal limits  COMPREHENSIVE METABOLIC PANEL - Abnormal; Notable for the following components:   Sodium 131 (*)    Chloride 92 (*)    CO2 33 (*)    Glucose, Bld 111 (*)    BUN 26 (*)    Total Bilirubin 1.7 (*)    GFR calc non Af Amer 54 (*)    All other components within normal limits  TROPONIN I - Abnormal; Notable for the following components:   Troponin I 0.03 (*)    All other components within normal limits  URINALYSIS, COMPLETE (UACMP) WITH MICROSCOPIC - Abnormal; Notable for the following components:   Color, Urine YELLOW (*)    APPearance CLEAR (*)    Leukocytes, UA TRACE (*)    Bacteria, UA RARE (*)    All other components within normal limits  CBG MONITORING, ED    RADIOLOGY Images were viewed by me  Right femur x-ray, CT head IMPRESSION: Chronic changes without acute intracranial abnormality.  Mild sphenoid sinus disease. IMPRESSION: No fracture. ____________________________________________  DIFFERENTIAL DIAGNOSIS   CVA, DVT, TIA, occult infection  FINAL ASSESSMENT AND PLAN  Right leg weakness   Plan: The patient had presented for right leg weakness. Patient's labs reveal elevated troponin, increased bicarb but neither are significantly changed from prior. Patient's imaging did not reveal any acute process, we are concerned about CVA causing right leg weakness.  I will discuss with the hospitalist for admission.  She cannot have an MRI due to her pacemaker.   Ulice Dash, MD   Note: This note was generated in part or whole with voice recognition software. Voice recognition is usually quite accurate but there are transcription errors that can and very often do occur. I apologize for any typographical errors that were not detected and corrected.     Emily Filbert, MD 10/26/17 1303    Emily Filbert, MD 10/26/17 1309

## 2017-10-26 NOTE — H&P (Signed)
[facility] [facility]   PATIENT NAME: Joanna Roberts    MR#:  [id number]  DATE OF BIRTH:  Feb 27, 1929  DATE OF ADMISSION:  10/26/2017  PRIMARY CARE PHYSICIAN: Patrice Paradise, MD   REQUESTING/REFERRING PHYSICIAN: Dr. Daryel November  CHIEF COMPLAINT: Extremity weakness   Chief Complaint  Patient presents with  . Extremity Weakness    HISTORY OF PRESENT ILLNESS:  Joanna Roberts  is a 84 y.o. female with a known history of essential hypertension, GERD, arthritis, comes from [facility] independent living secondary to right leg weakness that started this morning.  Patient lives at independent facility and she is very independent but started to have sudden onset of right leg weakness unable to ambulate.  No weakness of hands, left side.  No speech troubles.  No blurred vision.  Denies any recent illness.  Because of weakness on the right leg unable to walk.  Did not have any other neurological symptoms.  PAST MEDICAL HISTORY:   Past Medical History:  Diagnosis Date  . AICD (automatic cardioverter/defibrillator) present   . Allergy   . Anemia   . Arthritis   . Cataract   . CHF (congestive heart failure) (HCC)   . Depression   . Dysrhythmia   . GERD (gastroesophageal reflux disease)   . Heart murmur   . History of TIAs   . Hypertension   . Hypothyroidism   . Myocardial infarction (HCC)   . Osteoporosis   . Presence of permanent cardiac pacemaker   . Thyroid disease     PAST SURGICAL HISTOIRY:   Past Surgical History:  Procedure Laterality Date  . ABDOMINAL HYSTERECTOMY    . APPENDECTOMY    . HERNIA REPAIR    . IMPLANTABLE CARDIOVERTER DEFIBRILLATOR (ICD) GENERATOR CHANGE Left 07/27/2017   Procedure: ICD GENERATOR CHANGE;  Surgeon: Marcina Millard, MD;  Location: [facility] ORS;  Service: Cardiovascular;  Laterality: Left;  . INSERT / REPLACE / REMOVE PACEMAKER    . LOWER EXTREMITY ANGIOGRAPHY Left 02/08/2017   Procedure: Lower  Extremity Angiography;  Surgeon: Renford Dills, MD;  Location: [facility] INVASIVE CV LAB;  Service: Cardiovascular;  Laterality: Left;  . PERIPHERAL VASCULAR CATHETERIZATION Right 10/08/2014   Procedure: Lower Extremity Angiography;  Surgeon: Renford Dills, MD;  Location: [facility] INVASIVE CV LAB;  Service: Cardiovascular;  Laterality: Right;  . PERIPHERAL VASCULAR CATHETERIZATION  10/08/2014   Procedure: Lower Extremity Intervention;  Surgeon: Renford Dills, MD;  Location: [facility] INVASIVE CV LAB;  Service: Cardiovascular;;  . PERIPHERAL VASCULAR CATHETERIZATION N/A 03/23/2016   Procedure: Visceral Angiography;  Surgeon: Renford Dills, MD;  Location: [facility] INVASIVE CV LAB;  Service: Cardiovascular;  Laterality: N/A;  . pessary      SOCIAL HISTORY:   Social History   Tobacco Use  . Smoking status: Never Smoker  . Smokeless tobacco: Never Used  Substance Use Topics  . Alcohol use: No    FAMILY HISTORY:   Family History  Problem Relation Age of Onset  . Early death Mother   . Heart disease Mother   . Arthritis Father   . Asthma Father   . Stroke Sister     DRUG ALLERGIES:   Allergies  Allergen Reactions  . Lortab [Hydrocodone-Acetaminophen] Itching, Swelling and Rash  . Doxycycline Diarrhea  . Macrobid [Nitrofurantoin Monohyd Macro] Diarrhea and Nausea And Vomiting  . Nitrofurantoin Diarrhea, Nausea Only and Nausea And Vomiting    REVIEW OF SYSTEMS:  CONSTITUTIONAL: No fever, fatigue or  weakness.  EYES: No blurred or double vision.  EARS, NOSE, AND THROAT: No tinnitus or ear pain.  RESPIRATORY: No cough, shortness of breath, wheezing or hemoptysis.  CARDIOVASCULAR: No chest pain, orthopnea, edema.  GASTROINTESTINAL: No nausea, vomiting, diarrhea or abdominal pain.  GENITOURINARY: No dysuria, hematuria.  ENDOCRINE: No polyuria, nocturia,  HEMATOLOGY: No anemia, easy bruising or bleeding SKIN: No rash or lesion. MUSCULOSKELETAL: No joint pain or arthritis.    NEUROLOGIC: Has right leg weakness pSYCHIATRY: No anxiety or depression.   MEDICATIONS AT HOME:   Prior to Admission medications   Medication Sig Start Date End Date Taking? Authorizing Provider  carvedilol (COREG) 6.25 MG tablet Take 1 tablet (6.25 mg total) by mouth 2 (two) times daily. 09/26/17  Yes Mody, Patricia Pesa, MD  docusate sodium (COLACE) 100 MG capsule Take 100 mg by mouth 2 (two) times daily.   Yes [provider]  furosemide (LASIX) 20 MG tablet Take 2 tablets (40 mg total) by mouth daily. 09/26/17  Yes Mody, Patricia Pesa, MD  gabapentin (NEURONTIN) 300 MG capsule Take 1 capsule (300 mg total) by mouth 2 (two) times daily. 09/26/17  Yes Mody, Sital, MD  levothyroxine (SYNTHROID, LEVOTHROID) 25 MCG tablet TAKE 1 TABLET EVERY DAY ON EMPTY STOMACHWITH A GLASS OF WATER AT LEAST 30-60 MINBEFORE BREAKFAST 11/29/16  Yes [provider]  temazepam (RESTORIL) 7.5 MG capsule Take 7.5 mg by mouth at bedtime.  10/05/17  Yes [provider]  vitamin B-12 (CYANOCOBALAMIN) 1000 MCG tablet Take 1,000 mcg by mouth daily.   Yes [provider]  acetaminophen (TYLENOL) 500 MG tablet Take 1,000 mg by mouth every 8 (eight) hours as needed for mild pain or headache.    [provider]  levalbuterol Pauline Aus HFA) 45 MCG/ACT inhaler Inhale 1-2 puffs into the lungs every 6 (six) hours as needed for wheezing or shortness of breath.  09/05/17 09/05/18  [provider]  nitroGLYCERIN (NITROSTAT) 0.4 MG SL tablet Place 0.4 mg under the tongue every 5 (five) minutes as needed for chest pain.  09/22/16   [provider]  triamcinolone cream (KENALOG) 0.1 % Apply 1 application topically 2 (two) times daily as needed (for rash/skin irritation.).  04/28/17 04/28/18  [provider]      VITAL SIGNS:  Blood pressure 138/80, pulse 80, temperature 98.2 F (36.8 C), temperature source Oral, resp. rate 16, height 5\' 4"  (1.626 m), weight 59.5 kg (131 lb 3.2 oz), SpO2 100  %.  PHYSICAL EXAMINATION:  GENERAL:  84 y.o.-year-old patient lying in the bed with no acute distress.  EYES: Pupils equal, round, reactive to light  No scleral icterus. Extraocular muscles intact.  HEENT: Head atraumatic, normocephalic. Oropharynx and nasopharynx clear.  NECK:  Supple, no jugular venous distention. No thyroid enlargement, no tenderness.  LUNGS: Normal breath sounds bilaterally, no wheezing, rales,rhonchi or crepitation. No use of accessory muscles of respiration.  CARDIOVASCULAR: S1, S2 normal. No murmurs, rubs, or gallops.  ABDOMEN: Soft, nontender, nondistended. Bowel sounds present. No organomegaly or mass.  EXTREMITIES: No pedal edema, cyanosis, or clubbing.  NEUROLOGIC: Cranial nerves II through XII are intact. Muscle strength 1/5 right leg, 5/5 right upper extremity, left upper extremity and left lower extremity.. Sensation intact. Gait not checked.  Chronic leg edema and has patient stockings PSYCHIATRIC: The patient is alert and oriented x 3.  SKIN: No obvious rash, lesion, or ulcer.   LABORATORY PANEL:   CBC Recent Labs  Lab 10/26/17 1007  WBC 3.7  HGB 10.7*  HCT 33.1*  PLT 125*   ------------------------------------------------------------------------------------------------------------------  Chemistries  Recent Labs  Lab 10/26/17 1007  NA 131*  K 4.3  CL 92*  CO2 33*  GLUCOSE 111*  BUN 26*  CREATININE 0.93  CALCIUM 9.2  AST 23  ALT 16  ALKPHOS 45  BILITOT 1.7*   ------------------------------------------------------------------------------------------------------------------  Cardiac Enzymes Recent Labs  Lab 10/26/17 1007  TROPONINI 0.03*   ------------------------------------------------------------------------------------------------------------------  RADIOLOGY:  Ct Head Wo Contrast  Result Date: 10/26/2017 CLINICAL DATA:  Right-sided weakness EXAM: CT HEAD WITHOUT CONTRAST TECHNIQUE: Contiguous axial images were obtained from  the base of the skull through the vertex without intravenous contrast. COMPARISON:  09/29/2015 FINDINGS: Brain: Chronic atrophic and ischemic changes are identified and stable. Prior left lacunar infarct in the basal ganglia is noted. No findings to suggest acute hemorrhage, acute infarction or space-occupying mass lesion are noted. Vascular: No hyperdense vessel or unexpected calcification. Skull: Normal. Negative for fracture or focal lesion. Sinuses/Orbits: Mucosal changes are noted within the sphenoid sinus. Other: Some air is noted in the subcutaneous tissues of the face bilaterally likely related to retrograde filling from inadvertent air within an IV line. IMPRESSION: Chronic changes without acute intracranial abnormality. Mild sphenoid sinus disease. Electronically Signed   By: Mark  Lukens M.D.   On: 10/26/2017 10:36   Dg Femur Min 2 Views Right  Result Date: 10/26/2017 CLINICAL DATA:  Right lower extremity pain and weakness, no reported injury EXAM: RIGHT FEMUR 2 VIEWS COMPARISON:  None. FINDINGS: There is no evidence of fracture or other focal bone lesions. Small superior right patellar enthesophyte. Vascular calcifications in the soft tissues. Surgical clip in soft tissues medial to the mid right femur. IMPRESSION: No fracture. Electronically Signed   By: Jason A Poff M.D.   On: 10/26/2017 10:39    EKG:   Orders placed or performed during the hospital encounter of 10/26/17  . ED EKG  . ED EKG  . EKG 12-Lead  . EKG 12-Lead  . EKG 12-Lead  . EKG 12-Lead  Ventricular paced rhythm with 80 bpm.  IMPRESSION AND PLAN:   84 year old female with multiple medical problems of hypertension, chronic lymphedema, chronic systolic heart failure status post AICD, PPM degenerative disease of back cardiomyopathy comes in from [facility] living secondary to sudden onset of right leg weakness since this morning. 1.  Acute right leg weakness without any further any associated neurological changes:  Concerning for possible TIA: Patient CT head unremarkable but she cannot have MRI due to pacemaker.  Continue full dose aspirin, monitor on telemetry, obtain neurology consult PT, OT.  Also get ultrasound of carotids.  Patient echocardiogram last month showed EF 25 to 30% with diffuse hypokinesia.,  Patient may benefit from full dose anticoagulation due to history of atrial fibrillation and now comes in with left leg weakness.  Will discuss with neurology. #2 .chronic systolic heart failure with severe MR, TR: By CHF clinic.  Patient primary cardiologist is Dr. Fath.  #3 atrial fibrillation, status post biventricular AICD.  Patient developed hematoma after generator change, according to cardiology recommendation patient bleeding risk appears to exceed benefits potential benefits of anticoagulation 4.  Chronic lymphedema ; continue vascular follow-up for lymphedema pumps as recommended.   All the records are reviewed and cas<[id number]>ussed with ED p<[id number] B  Between 7am to 6pm - Pager - [phone]  After 6pm go to [email or web address] - password [email or web address] [facility]  [facility]  Office  [phone]  CC: Primary care physician; Patrice Paradise, MD  Note: This dictation was prepared with Dragon dictation along with smaller phrase technology. Any transcriptional errors that result from this process are unintentional.

## 2017-10-26 NOTE — ED Notes (Signed)
Helped pt onto bedpan. Pt resting. Respirations even and unlabored. NAD. Stretcher in low and locked position. Call bell in reach. Denies needs at this time. Will continue to monitor.

## 2017-10-26 NOTE — ED Triage Notes (Signed)
Pt presented today from [facility] for R leg weakness that started this morning. BG 132. No facial droop, grips equal, pt alert and oriented x4. She is on 2 L [place] at baseline. Last known normal when she went to bed last night. Facility staff went to take her to breakfast this morning and noticed weakness. She is unable to walk this morning.

## 2017-10-26 NOTE — ED Notes (Signed)
Pt to CT at this time.

## 2017-10-26 NOTE — Progress Notes (Signed)
Family Meeting Note  Advance Directive:yes  Today a meeting took place with the Patient.  Patient is able to participate in decision making.  The following clinical team members were present during this meeting:MD Because of history of chronic systolic heart failure with EF 20 to 25%, advanced age with multiple medical problems patient wanted to be DNR and DNI.   Additional follow-up to be provided: We will follow her every day and make further recommendation  Time spent during discussion:20 minutes  Katha Hamming, MD

## 2017-10-27 ENCOUNTER — Observation Stay
Admit: 2017-10-27 | Discharge: 2017-10-27 | Disposition: A | Discharge status: Home / Self Care | Payer: PPO | Attending: Internal Medicine | Admitting: Internal Medicine

## 2017-10-27 ENCOUNTER — Observation Stay: Payer: PPO

## 2017-10-27 DIAGNOSIS — I5023 Acute on chronic systolic (congestive) heart failure: Secondary | ICD-10-CM | POA: Diagnosis not present

## 2017-10-27 DIAGNOSIS — R29898 Other symptoms and signs involving the musculoskeletal system: Secondary | ICD-10-CM | POA: Diagnosis not present

## 2017-10-27 DIAGNOSIS — L8990 Pressure ulcer of unspecified site, unspecified stage: Secondary | ICD-10-CM

## 2017-10-27 DIAGNOSIS — E039 Hypothyroidism, unspecified: Secondary | ICD-10-CM | POA: Diagnosis not present

## 2017-10-27 DIAGNOSIS — I63412 Cerebral infarction due to embolism of left middle cerebral artery: Secondary | ICD-10-CM

## 2017-10-27 DIAGNOSIS — R531 Weakness: Secondary | ICD-10-CM | POA: Diagnosis not present

## 2017-10-27 LAB — LIPID PANEL
CHOL/HDL RATIO: 3 ratio
CHOLESTEROL: 117 mg/dL (ref 0–200)
HDL: 39 mg/dL — ABNORMAL LOW (ref >40)
LDL Cholesterol: 68 mg/dL (ref 0–99)
TRIGLYCERIDES: 51 mg/dL (ref <150)
VLDL: 10 mg/dL (ref 0–40)

## 2017-10-27 LAB — ECHOCARDIOGRAM COMPLETE
Height: 64 in
Weight: 2099.2 oz

## 2017-10-27 LAB — HEMOGLOBIN A1C
HEMOGLOBIN A1C: 5.5 % (ref 4.8–5.6)
MEAN PLASMA GLUCOSE: 111.15 mg/dL

## 2017-10-27 MED ORDER — ASPIRIN EC 81 MG PO TBEC
81.0000 mg | DELAYED_RELEASE_TABLET | Freq: Every day | ORAL | Status: DC
  Administered 2017-10-27 – 2017-10-29 (×3): 81 mg via ORAL
  Filled 2017-10-27 (×3): qty 1

## 2017-10-27 MED ORDER — ALPRAZOLAM 0.5 MG PO TABS
0.5000 mg | ORAL_TABLET | Freq: Two times a day (BID) | ORAL | Status: DC
  Administered 2017-10-27 – 2017-10-28 (×2): 0.5 mg via ORAL
  Filled 2017-10-27 (×5): qty 1

## 2017-10-27 MED ORDER — ONDANSETRON HCL 4 MG PO TABS
4.0000 mg | ORAL_TABLET | Freq: Four times a day (QID) | ORAL | Status: DC | PRN
  Administered 2017-10-27: 4 mg via ORAL
  Filled 2017-10-27: qty 1

## 2017-10-27 MED ORDER — ENSURE ENLIVE PO LIQD
237.0000 mL | Freq: Two times a day (BID) | ORAL | Status: DC
  Administered 2017-10-27: 237 mL via ORAL

## 2017-10-27 MED ORDER — ALPRAZOLAM 0.5 MG PO TABS: 0.5000 mg | ORAL_TABLET | Freq: Two times a day (BID) | ORAL | Status: DC

## 2017-10-27 NOTE — Care Management Obs Status (Signed)
MEDICARE OBSERVATION STATUS NOTIFICATION   Patient Details  Name: Joanna Roberts MRN: [id number] Date of Birth: [birth year]   Medicare Observation Status Notification Given:  Yes  Permission to sign by x    Gwenette Greet, RN 10/27/2017, 8:36 AM

## 2017-10-27 NOTE — Consult Note (Signed)
Joanna Roberts is a 84 y.o. female  [id number]  Primary Cardiologist: Dr. Lady Gary Reason for Consultation: leg weakness/CHF  HPI: this 84 year old female with history of hypertension presented to the hospital with acute right leg weakness and unable to ambulate I was asked to evaluate the patient.patient apparently has history of chronic systolic heart failure with status post AICD and cardiomyopathy. She is normally followed by Dr. Lady Gary.   Review of Systems: no chest pain.   Past Medical History:  Diagnosis Date  . AICD (automatic cardioverter/defibrillator) present   . Allergy   . Anemia   . Arthritis   . Cataract   . CHF (congestive heart failure) (HCC)   . Depression   . Dysrhythmia   . GERD (gastroesophageal reflux disease)   . Heart murmur   . History of TIAs   . Hypertension   . Hypothyroidism   . Myocardial infarction (HCC)   . Osteoporosis   . Presence of permanent cardiac pacemaker   . Thyroid disease     Medications Prior to Admission  Medication Sig Dispense Refill  . carvedilol (COREG) 6.25 MG tablet Take 1 tablet (6.25 mg total) by mouth 2 (two) times daily. 60 tablet 0  . docusate sodium (COLACE) 100 MG capsule Take 100 mg by mouth 2 (two) times daily.    . furosemide (LASIX) 20 MG tablet Take 2 tablets (40 mg total) by mouth daily. 30 tablet 0  . gabapentin (NEURONTIN) 300 MG capsule Take 1 capsule (300 mg total) by mouth 2 (two) times daily.    Marland Kitchen levothyroxine (SYNTHROID, LEVOTHROID) 25 MCG tablet TAKE 1 TABLET EVERY DAY ON EMPTY STOMACHWITH A GLASS OF WATER AT LEAST 30-60 MINBEFORE BREAKFAST    . temazepam (RESTORIL) 7.5 MG capsule Take 7.5 mg by mouth at bedtime.     . vitamin B-12 (CYANOCOBALAMIN) 1000 MCG tablet Take 1,000 mcg by mouth daily.    Marland Kitchen acetaminophen (TYLENOL) 500 MG tablet Take 1,000 mg by mouth every 8 (eight) hours as needed for mild pain or headache.    . levalbuterol (XOPENEX HFA) 45 MCG/ACT inhaler Inhale 1-2 puffs into the lungs every 6  (six) hours as needed for wheezing or shortness of breath.     . nitroGLYCERIN (NITROSTAT) 0.4 MG SL tablet Place 0.4 mg under the tongue every 5 (five) minutes as needed for chest pain.     Marland Kitchen triamcinolone cream (KENALOG) 0.1 % Apply 1 application topically 2 (two) times daily as needed (for rash/skin irritation.).        Marland Kitchen aspirin EC  81 mg Oral Daily  . carvedilol  6.25 mg Oral BID  . docusate sodium  100 mg Oral BID  . enoxaparin (LOVENOX) injection  40 mg Subcutaneous Q24H  . furosemide  40 mg Oral Daily  . gabapentin  300 mg Oral BID  . levothyroxine  25 mcg Oral QAC breakfast  . temazepam  7.5 mg Oral QHS  . vitamin B-12  1,000 mcg Oral Daily    Infusions:   Allergies  Allergen Reactions  . Lortab [Hydrocodone-Acetaminophen] Itching, Swelling and Rash  . Doxycycline Diarrhea  . Macrobid [Nitrofurantoin Monohyd Macro] Diarrhea and Nausea And Vomiting  . Nitrofurantoin Diarrhea, Nausea Only and Nausea And Vomiting    Social History   Socioeconomic History  . Marital status: Widowed    Spouse name: Not on file  . Number of children: Not on file  . Years of education: Not on file  . Highest education level:  Not on file  Occupational History  . Not on file  Social Needs  . Financial resource strain: Not on file  . Food insecurity:    Worry: Not on file    Inability: Not on file  . Transportation needs:    Medical: Not on file    Non-medical: Not on file  Tobacco Use  . Smoking status: Never Smoker  . Smokeless tobacco: Never Used  Substance and Sexual Activity  . Alcohol use: No  . Drug use: No  . Sexual activity: Not Currently    Birth control/protection: Post-menopausal  Lifestyle  . Physical activity:    Days per week: Not on file    Minutes per session: Not on file  . Stress: Not on file  Relationships  . Social connections:    Talks on phone: Not on file    Gets together: Not on file    Attends religious service: Not on file    Active member of  club or organization: Not on file    Attends meetings of clubs or organizations: Not on file    Relationship status: Not on file  . Intimate partner violence:    Fear of current or ex partner: Not on file    Emotionally abused: Not on file    Physically abused: Not on file    Forced sexual activity: Not on file  Other Topics Concern  . Not on file  Social History Narrative  . Not on file    Family History  Problem Relation Age of Onset  . Early death Mother   . Heart disease Mother   . Arthritis Father   . Asthma Father   . Stroke Sister     PHYSICAL EXAM: Vitals:   10/27/17 0558 10/27/17 0808  BP: 114/75 (!) 139/93  Pulse: 73 74  Resp: 18 18  Temp: 97.8 F (36.6 C) 98.2 F (36.8 C)  SpO2: 99% 97%     Intake/Output Summary (Last 24 hours) at 10/27/2017 1131 Last data filed at 10/27/2017 1023 Gross per 24 hour  Intake 240 ml  Output 850 ml  Net -610 ml    General:  Well appearing. No respiratory difficulty HEENT: normal Neck: supple. no JVD. Carotids 2+ bilat; no bruits. No lymphadenopathy or thryomegaly appreciated. Cor: PMI nondisplaced. Regular rate & rhythm. No rubs, gallops or murmurs. Lungs: clear Abdomen: soft, nontender, nondistended. No hepatosplenomegaly. No bruits or masses. Good bowel sounds. Extremities: no cyanosis, clubbing, rash, edema Neuro: alert & oriented x 3, cranial nerves grossly intact. moves all 4 extremities w/o difficulty. Affect pleasant.  ECG: VVI 100% paced rhythm  Results for orders placed or performed during the hospital encounter of 10/26/17 (from the past 24 hour(s))  Hemoglobin A1c     Status: None   Collection Time: 10/27/17  4:04 AM  Result Value Ref Range   Hgb A1c MFr Bld 5.5 4.8 - 5.6 %   Mean Plasma Glucose 111.15 mg/dL  Lipid panel     Status: Abnormal   Collection Time: 10/27/17  4:04 AM  Result Value Ref Range   Cholesterol 117 0 - 200 mg/dL   Triglycerides 51 <537 mg/dL   HDL 39 (L) >48 mg/dL   Total CHOL/HDL  Ratio 3.0 RATIO   VLDL 10 0 - 40 mg/dL   LDL Cholesterol 68 0 - 99 mg/dL   Ct Head Wo Contrast  Result Date: 10/26/2017 CLINICAL DATA:  Right-sided weakness EXAM: CT HEAD WITHOUT CONTRAST TECHNIQUE: Contiguous axial images were  obtained from the base of the skull through the vertex without intravenous contrast. COMPARISON:  09/29/2015 FINDINGS: Brain: Chronic atrophic and ischemic changes are identified and stable. Prior left lacunar infarct in the basal ganglia is noted. No findings to suggest acute hemorrhage, acute infarction or space-occupying mass lesion are noted. Vascular: No hyperdense vessel or unexpected calcification. Skull: Normal. Negative for fracture or focal lesion. Sinuses/Orbits: Mucosal changes are noted within the sphenoid sinus. Other: Some air is noted in the subcutaneous tissues of the face bilaterally likely related to retrograde filling from inadvertent air within an IV line. IMPRESSION: Chronic changes without acute intracranial abnormality. Mild sphenoid sinus disease. Electronically Signed   By: Alcide Clever M.D.   On: 10/26/2017 10:36   US Carotid Bilateral  Result Date: 10/27/2017 CLINICAL DATA:  84 year old female with a history of TIA. Cardiovascular risk factors include known stroke/TIA, known coronary artery disease EXAM: BILATERAL CAROTID DUPLEX ULTRASOUND TECHNIQUE: Wallace Cullens scale imaging, color Doppler and duplex ultrasound were performed of bilateral carotid and vertebral arteries in the neck. COMPARISON:  None. FINDINGS: Criteria: Quantification of carotid stenosis is based on velocity parameters that correlate the residual internal carotid diameter with NASCET-based stenosis levels, using the diameter of the distal internal carotid lumen as the denominator for stenosis measurement. The following velocity measurements were obtained: RIGHT ICA:  Systolic 37 cm/sec, Diastolic 6 cm/sec CCA:  37 cm/sec SYSTOLIC ICA/CCA RATIO:  1.0 ECA:  37 cm/sec LEFT ICA:  Systolic 106 cm/sec,  Diastolic 38 cm/sec CCA:  50 cm/sec SYSTOLIC ICA/CCA RATIO:  2.1 ECA:  72 cm/sec Right Brachial SBP: Not acquired Left Brachial SBP: Not acquired RIGHT CAROTID ARTERY: No significant calcifications of the right common carotid artery. Intermediate waveform maintained. Heterogeneous and partially calcified plaque at the right carotid bifurcation. No significant lumen shadowing. Low resistance waveform of the right ICA, with parvus tardus configuration. No significant tortuosity. RIGHT VERTEBRAL ARTERY: Antegrade flow with low resistance waveform, with parvus tardus configuration. LEFT CAROTID ARTERY: No significant calcifications of the left common carotid artery. Intermediate waveform maintained. Heterogeneous and partially calcified plaque at the left carotid bifurcation without significant lumen shadowing. Low resistance waveform of the left ICA, with parvus tardus configuration. No significant tortuosity. LEFT VERTEBRAL ARTERY: Antegrade flow with low resistance waveform, with parvus tardus configuration. IMPRESSION: Color duplex indicates minimal heterogeneous and calcified plaque, with no hemodynamically significant stenosis by duplex criteria in the extracranial cerebrovascular circulation. The parvus tardus configuration throughout the cerebral vessels may reflect aortic stenosis. Correlation with ECHO recommended, if not already performed. Signed, Yvone Neu. Reyne Dumas, RPVI [facility] Electronically Signed   By: Gilmer Mor D.O.   On: 10/27/2017 08:05   Dg Femur Min 2 Views Right  Result Date: 10/26/2017 CLINICAL DATA:  Right lower extremity pain and weakness, no reported injury EXAM: RIGHT FEMUR 2 VIEWS COMPARISON:  None. FINDINGS: There is no evidence of fracture or other focal bone lesions. Small superior right patellar enthesophyte. Vascular calcifications in the soft tissues. Surgical clip in soft tissues medial to the mid right femur.  IMPRESSION: No fracture. Electronically Signed   By: Delbert Phenix M.D.   On: 10/26/2017 10:39     ASSESSMENT AND PLAN: patient has chronic congestive heart failure due to cardiomyopathy with AICD. He presented to the hospital with inability to walk due to leg pain. Advise getting Dopplers of the legs to rule out DVT and will get echocardiogram to evaluate left ventricular ejection fraction. Continue current medication.  Lamaria Hildebrandt A

## 2017-10-27 NOTE — Progress Notes (Signed)
OT Cancellation Note  Patient Details Name: Joanna Roberts MRN: [id number] DOB: 1929/04/04   Cancelled Treatment:    Reason Eval/Treat Not Completed: Other (comment). Per chart review, cardiology recommending dopplers to rule out DVT and an echocardiogram to evaluate L ventricular EF. Will hold OT treatment until dopplers are completed and if negative for DVT. Will re-attempt next date.   Richrd Prime, MPH, MS, OTR/L ascom [phone] 10/27/17, 1:28 PM

## 2017-10-27 NOTE — Progress Notes (Addendum)
PT Cancellation Note  Patient Details Name: TATUM CORL MRN: [id number] DOB: 24-Dec-1928   Cancelled Treatment:    Reason Eval/Treat Not Completed: Medical issues which prohibited therapy Per chart review, cardiology recommending dopplers to rule out DVT and an echocardiogram to evaluate L ventricular EF. Will hold PT treatment until dopplers are completed and if negative for DVT. Will re-attempt next date  Precious Bard, PT, DPT    10/27/2017, 1:55 PM

## 2017-10-27 NOTE — Evaluation (Signed)
Clinical/Bedside Swallow Evaluation Patient Details  Name: Joanna Roberts MRN: [id number] Date of Birth: 05-01-1928  Today's Date: 10/27/2017 Time: SLP Start Time (ACUTE ONLY): 1050 SLP Stop Time (ACUTE ONLY): 1150 SLP Time Calculation (min) (ACUTE ONLY): 60 min  Past Medical History:  Past Medical History:  Diagnosis Date  . AICD (automatic cardioverter/defibrillator) present   . Allergy   . Anemia   . Arthritis   . Cataract   . CHF (congestive heart failure) (HCC)   . Depression   . Dysrhythmia   . GERD (gastroesophageal reflux disease)   . Heart murmur   . History of TIAs   . Hypertension   . Hypothyroidism   . Myocardial infarction (HCC)   . Osteoporosis   . Presence of permanent cardiac pacemaker   . Thyroid disease    Past Surgical History:  Past Surgical History:  Procedure Laterality Date  . ABDOMINAL HYSTERECTOMY    . APPENDECTOMY    . HERNIA REPAIR    . IMPLANTABLE CARDIOVERTER DEFIBRILLATOR (ICD) GENERATOR CHANGE Left 07/27/2017   Procedure: ICD GENERATOR CHANGE;  Surgeon: Marcina Millard, MD;  Location: [facility] ORS;  Service: Cardiovascular;  Laterality: Left;  . INSERT / REPLACE / REMOVE PACEMAKER    . LOWER EXTREMITY ANGIOGRAPHY Left 02/08/2017   Procedure: Lower Extremity Angiography;  Surgeon: Renford Dills, MD;  Location: [facility] INVASIVE CV LAB;  Service: Cardiovascular;  Laterality: Left;  . PERIPHERAL VASCULAR CATHETERIZATION Right 10/08/2014   Procedure: Lower Extremity Angiography;  Surgeon: Renford Dills, MD;  Location: [facility] INVASIVE CV LAB;  Service: Cardiovascular;  Laterality: Right;  . PERIPHERAL VASCULAR CATHETERIZATION  10/08/2014   Procedure: Lower Extremity Intervention;  Surgeon: Renford Dills, MD;  Location: [facility] INVASIVE CV LAB;  Service: Cardiovascular;;  . PERIPHERAL VASCULAR CATHETERIZATION N/A 03/23/2016   Procedure: Visceral Angiography;  Surgeon: Renford Dills, MD;  Location: [facility] INVASIVE CV LAB;  Service: Cardiovascular;   Laterality: N/A;  . pessary     HPI:  Pt is a 84 y.o. female with a history of anemia, arthritis, CHF (EF 25-30%), GERD, hypertension, hypothyroidism, MI, on 2L O2 at baseline, who presents to the ED for right leg weakness that started this am and was unable to walk.  Patient arrives from [facility] [place].  No facial droop or speech issues.  She was last known normal when she went to bed that night.  She denies any recent illness or other complaints.  Patient lives at independent facility and she is very independent but started to have sudden onset of right leg weakness unable to ambulate.  Pt also c/o being unable to use her R arm this morning at this evaluation.  OT/PT following.  Pt is on a regular diet; c/o intermittent coughing when drinking liquids but did not report an increase if frequency since admission.    Assessment / Plan / Recommendation Clinical Impression  Pt appears to present w/ adequate oropharyngeal phase swallowing function w/ reduced risk for aspiration when following general aspiration precautions. Pt consumed trials of thin liquids, purees, and soft solids w/ no overt s/s of aspiration noted; no decline in vocal quality or respiratory status during/post trials. Oral phase was grossly [facility] for bolus control and timely A-P transfer for swallowing and clearing. No anterior leakage occurred. OM exam was [facility]. Pt has a baseline of GERD but pt did not c/o any deficits during the eval. Recommend pt continue w/ current diet as ordered w/ meats cut for easier intake; general aspiration precautions;  Reflux precautions as pt has GERD baseline. Recommend Pills swallowed w/ a Puree for easier, safer swallowing IF difficulty noted w/ liquids. Pt WILL NEED ASSISTANCE at meals w/ feeding as she c/o being unable to use her RUE d/t weakness - NSG informed/agreed. Pt was given education general aspiration precautions including sitting upright for oral intake.  No further skilled ST services indicated at  this time as pt appears at her baseline for swallowing. Dietitian was consulted d/t pt's report of 27lb weight loss this year(CM note). NSG updated and agreed.  SLP Visit Diagnosis: Dysphagia, unspecified (R13.10)    Aspiration Risk  (reduced following general aspiration precautions)    Diet Recommendation  Regular diet w/ tougher meats/foods cut and moist; Thin liquids. General aspiration precautions; REFLUX/GERD precautions baseline. Assistance at meals d/t RUE weakness.   Medication Administration: Whole meds with puree(if needed for easier, safer swallowing)    Other  Recommendations Recommended Consults: (Dietician f/u d/t report of weight loss - 27lbs this yr) Oral Care Recommendations: Oral care BID;Staff/trained caregiver to provide oral care;Patient independent with oral care Other Recommendations: (n/a)   Follow up Recommendations None      Frequency and Duration (n/a)  (n/a)       Prognosis Prognosis for Safe Diet Advancement: Good Barriers to Reach Goals: (n/a)      Swallow Study   General Date of Onset: 10/26/17 HPI: Pt is a 84 y.o. female with a history of anemia, arthritis, CHF (EF 25-30%), GERD, hypertension, hypothyroidism, MI, on 2L O2 at baseline, who presents to the ED for right leg weakness that started this am and was unable to walk.  Patient arrives from [facility] [place].  No facial droop or speech issues.  She was last known normal when she went to bed that night.  She denies any recent illness or other complaints.  Patient lives at independent facility and she is very independent but started to have sudden onset of right leg weakness unable to ambulate.  Pt also c/o being unable to use her R arm this morning at this evaluation.  OT/PT following.  Pt is on a regular diet; c/o intermittent coughing when drinking liquids but did not report an increase if frequency since admission.  Type of Study: Bedside Swallow Evaluation Previous Swallow Assessment: none  reported Diet Prior to this Study: Regular;Thin liquids Temperature Spikes Noted: No(wbc 3.7) Respiratory Status: Nasal cannula(2 liters) History of Recent Intubation: No Behavior/Cognition: Alert;Cooperative;Pleasant mood Oral Cavity Assessment: Within Functional Limits Oral Care Completed by SLP: Recent completion by staff Oral Cavity - Dentition: Adequate natural dentition Vision: Functional for self-feeding Self-Feeding Abilities: Able to feed self;Needs assist;Needs set up(has to use LUE d/t RUE weakness) Patient Positioning: Upright in bed(needed instruction to sit more upright) Baseline Vocal Quality: Normal Volitional Cough: Strong Volitional Swallow: Able to elicit    Oral/Motor/Sensory Function Overall Oral Motor/Sensory Function: Within functional limits   Ice Chips Ice chips: Not tested   Thin Liquid Thin Liquid: Within functional limits Presentation: Cup;Self Fed;Straw(~6 ozs )    Nectar Thick Nectar Thick Liquid: Not tested   Honey Thick Honey Thick Liquid: Not tested   Puree Puree: Within functional limits Presentation: Self Fed;Spoon(~3 ozs)   Solid   GO   Solid: Within functional limits Presentation: Self Fed(2 trials) Other Comments: small pieces          Jerilynn Som, MS, CCC-SLP Joanna Roberts 10/27/2017,12:22 PM

## 2017-10-27 NOTE — Care Management Note (Signed)
Case Management Note  Patient Details  Name: Joanna Roberts MRN: [id number] Date of Birth: 1928/07/26  Subjective/Objective:                  Admitted to [facility] with left leg weakness (patients states right leg) under observation status. Re-admit. Lives at [facility] 3[facility]4 years. Daughter is Denita Lun [phone]) "Been awhile since I seen Dr. Merlinda Frederick).  Currently seen by [facility] for RN, PT, Aide, and Child psychotherapist. [facility] in the past. Home oxygen per [facility] about 2 months. Uses 2 liters per nasal cannula continuous. No skilled nursing. Rolling walker and cane in the home. Takes care of all basic activities of daily living herself, doesn't drive. Last fall was 2 months ago. Decreased appetite. Lost 27 pounds this year  Action/Plan: Declined skilled nursing last admission.  Skilled facility recommended per occupational therapy in emergency room Will continue to follow for plans, if needed.   Expected Discharge Date:                  Expected Discharge Plan:     In-House Referral:     Discharge planning Services     Post Acute Care Choice:    Choice offered to:     DME Arranged:    DME Agency:     HH Arranged:    HH Agency:     Status of Service:     If discussed at [employer], dates discussed:    Additional Comments:  Gwenette Greet, RN MSN CCM Care Management [phone] 10/27/2017, 8:38 AM

## 2017-10-27 NOTE — Progress Notes (Signed)
Xanax 0.5 mg oral BID, zofran 4 mg oral Q6hrs PRN

## 2017-10-27 NOTE — Consult Note (Signed)
Reason for Consult: R sided weakness  Referring Physician: Dr. Luberta Mutter   CC: R sided weakness   HPI: Joanna Roberts is an 84 y.o. female with a known history of essential hypertension, GERD, arthritis, comes from  independent living secondary to right sided weakness.  Patient lives at independent facility and she is very independent but started to have sudden onset of right leg weakness unable to ambulate.  Pt does have hx of A-fib but anticoagulation was stopped about a year ago.     Past Medical History:  Diagnosis Date  . AICD (automatic cardioverter/defibrillator) present   . Allergy   . Anemia   . Arthritis   . Cataract   . CHF (congestive heart failure) (HCC)   . Depression   . Dysrhythmia   . GERD (gastroesophageal reflux disease)   . Heart murmur   . History of TIAs   . Hypertension   . Hypothyroidism   . Myocardial infarction (HCC)   . Osteoporosis   . Presence of permanent cardiac pacemaker   . Thyroid disease     Past Surgical History:  Procedure Laterality Date  . ABDOMINAL HYSTERECTOMY    . APPENDECTOMY    . HERNIA REPAIR    . IMPLANTABLE CARDIOVERTER DEFIBRILLATOR (ICD) GENERATOR CHANGE Left 07/27/2017   Procedure: ICD GENERATOR CHANGE;  Surgeon: Marcina Millard, MD;  Location: [facility] ORS;  Service: Cardiovascular;  Laterality: Left;  . INSERT / REPLACE / REMOVE PACEMAKER    . LOWER EXTREMITY ANGIOGRAPHY Left 02/08/2017   Procedure: Lower Extremity Angiography;  Surgeon: Renford Dills, MD;  Location: [facility] INVASIVE CV LAB;  Service: Cardiovascular;  Laterality: Left;  . PERIPHERAL VASCULAR CATHETERIZATION Right 10/08/2014   Procedure: Lower Extremity Angiography;  Surgeon: Renford Dills, MD;  Location: [facility] INVASIVE CV [facility];  Service: Cardiovascular;  Laterality: Right;  . PERIPHERAL VASCULAR CATHETERIZATION  10/08/2014   Procedure: Lower Extremity Intervention;  Surgeon: Renford Dills, MD;  Location: [facility] INVASIVE CV LAB;  Service: Cardiovascular;;   . PERIPHERAL VASCULAR CATHETERIZATION N/A 03/23/2016   Procedure: Visceral Angiography;  Surgeon: Renford Dills, MD;  Location: [facility] INVASIVE CV LAB;  Service: Cardiovascular;  Laterality: N/A;  . pessary      Family History  Problem Relation Age of Onset  . Early death Mother   . Heart disease Mother   . Arthritis Father   . Asthma Father   . Stroke Sister     Social History:  reports that she has never smoked. She has never used smokeless tobacco. She reports that she does not drink alcohol or use drugs.  Allergies  Allergen Reactions  . Lortab [Hydrocodone-Acetaminophen] Itching, Swelling and Rash  . Doxycycline Diarrhea  . Macrobid [Nitrofurantoin Monohyd Macro] Diarrhea and Nausea And Vomiting  . Nitrofurantoin Diarrhea, Nausea Only and Nausea And Vomiting    Medications: I have reviewed the patient's current medications.  ROS: History obtained from the patient  General ROS: negative for - chills, fatigue, fever, night sweats, weight gain or weight loss Psychological ROS: negative for - behavioral disorder, hallucinations, memory difficulties, mood swings or suicidal ideation Ophthalmic ROS: negative for - blurry vision, double vision, eye pain or loss of vision ENT ROS: negative for - epistaxis, nasal discharge, oral lesions, sore throat, tinnitus or vertigo Allergy and Immunology ROS: negative for - hives or itchy/watery eyes Hematological and Lymphatic ROS: negative for - bleeding problems, bruising or swollen lymph nodes Endocrine ROS: negative for - galactorrhea, hair pattern changes, polydipsia/polyuria or temperature intolerance  Respiratory ROS: negative for - cough, hemoptysis, shortness of breath or wheezing Cardiovascular ROS: negative for - chest pain, dyspnea on exertion, edema or irregular heartbeat Gastrointestinal ROS: negative for - abdominal pain, diarrhea, hematemesis, nausea/vomiting or stool incontinence Genito-Urinary ROS: negative for - dysuria,  hematuria, incontinence or urinary frequency/urgency Musculoskeletal ROS: negative for - joint swelling or muscular weakness Neurological ROS: as noted in HPI Dermatological ROS: negative for rash and skin lesion changes  Physical Examination: Blood pressure (!) 139/93, pulse 74, temperature 98.2 F (36.8 C), temperature source Oral, resp. rate 18, height 5\' 4"  (1.626 m), weight 131 lb 3.2 oz (59.5 kg), SpO2 97 %.   Neurological Examination   Mental Status: Alert, oriented, thought content appropriate.  Speech fluent without evidence of aphasia.  Able to follow 3 step commands without difficulty. Cranial Nerves: II: Discs flat bilaterally; Visual fields grossly normal, pupils equal, round, reactive to light and accommodation III,IV, VI: ptosis not present, extra-ocular motions intact bilaterally V,VII: smile symmetric, facial light touch sensation normal bilaterally VIII: hearing normal bilaterally IX,X: gag reflex present XI: bilateral shoulder shrug XII: midline tongue extension Motor: Right : Upper extremity   0/5    Left:     Upper extremity   5/5  Lower extremity   0/5     Lower extremity   5/5 Tone and bulk:normal tone throughout; no atrophy noted Sensory: Pinprick and light touch intact throughout, bilaterally Deep Tendon Reflexes: 1+ and symmetric throughout Plantars: Right: downgoing   Left: downgoing Cerebellar: normal finger-to-nose, normal rapid alternating movements and normal heel-to-shin test Gait: not tested      Laboratory Studies:   Basic Metabolic Panel: Recent Labs  Lab 10/26/17 1007  NA 131*  K 4.3  CL 92*  CO2 33*  GLUCOSE 111*  BUN 26*  CREATININE 0.93  CALCIUM 9.2    Liver Function Tests: Recent Labs  Lab 10/26/17 1007  AST 23  ALT 16  ALKPHOS 45  BILITOT 1.7*  PROT 6.6  ALBUMIN 3.6   No results for input(s): LIPASE, AMYLASE in the last 168 hours. No results for input(s): AMMONIA in the last 168 hours.  CBC: Recent Labs  Lab  10/26/17 1007  WBC 3.7  NEUTROABS 2.6  HGB 10.7*  HCT 33.1*  MCV 96.6  PLT 125*    Cardiac Enzymes: Recent Labs  Lab 10/26/17 1007  TROPONINI 0.03*    BNP: Invalid input(s): POCBNP  CBG: No results for input(s): GLUCAP in the last 168 hours.  Microbiology: Results for orders placed or performed during the hospital encounter of 09/23/17  MRSA PCR Screening     Status: None   Collection Time: 09/24/17  2:25 AM  Result Value Ref Range Status   MRSA by PCR NEGATIVE NEGATIVE Final    Comment:        The GeneXpert MRSA Assay (FDA approved for NASAL specimens only), is one component of a comprehensive MRSA colonization surveillance program. It is not intended to diagnose MRSA infection nor to guide or monitor treatment for MRSA infections. Performed at [facility], [address] [place], [place]     Coagulation Studies: No results for input(s): LABPROT, INR in the last 72 hours.  Urinalysis:  Recent Labs  Lab 10/26/17 1117  COLORURINE YELLOW*  LABSPEC 1.014  PHURINE 6.0  GLUCOSEU NEGATIVE  HGBUR NEGATIVE  BILIRUBINUR NEGATIVE  KETONESUR NEGATIVE  PROTEINUR NEGATIVE  NITRITE NEGATIVE  LEUKOCYTESUR TRACE*    Lipid Panel:     Component Value  Date/Time   CHOL 117 10/27/2017 0404   CHOL 136 11/02/2013 0540   TRIG 51 10/27/2017 0404   TRIG 85 11/02/2013 0540   HDL 39 (L) 10/27/2017 0404   HDL 34 (L) 11/02/2013 0540   CHOLHDL 3.0 10/27/2017 0404   VLDL 10 10/27/2017 0404   VLDL 17 11/02/2013 0540   LDLCALC 68 10/27/2017 0404   LDLCALC 85 11/02/2013 0540    HgbA1C:  Lab Results  Component Value Date   HGBA1C 5.5 10/27/2017     Other results: EKG: .Afib with normal rate   Imaging: Ct Head Wo Contrast  Result Date: 10/26/2017 CLINICAL DATA:  Right-sided weakness EXAM: CT HEAD WITHOUT CONTRAST TECHNIQUE: Contiguous axial images were obtained from the base of the skull through the vertex without intravenous contrast.  COMPARISON:  09/29/2015 FINDINGS: Brain: Chronic atrophic and ischemic changes are identified and stable. Prior left lacunar infarct in the basal ganglia is noted. No findings to suggest acute hemorrhage, acute infarction or space-occupying mass lesion are noted. Vascular: No hyperdense vessel or unexpected calcification. Skull: Normal. Negative for fracture or focal lesion. Sinuses/Orbits: Mucosal changes are noted within the sphenoid sinus. Other: Some air is noted in the subcutaneous tissues of the face bilaterally likely related to retrograde filling from inadvertent air within an IV line. IMPRESSION: Chronic changes without acute intracranial abnormality. Mild sphenoid sinus disease. Electronically Signed   By: Alcide Clever M.D.   On: 10/26/2017 10:36   US Carotid Bilateral  Result Date: 10/27/2017 CLINICAL DATA:  84 year old female with a history of TIA. Cardiovascular risk factors include known stroke/TIA, known coronary artery disease EXAM: BILATERAL CAROTID DUPLEX ULTRASOUND TECHNIQUE: Wallace Cullens scale imaging, color Doppler and duplex ultrasound were performed of bilateral carotid and vertebral arteries in the neck. COMPARISON:  None. FINDINGS: Criteria: Quantification of carotid stenosis is based on velocity parameters that correlate the residual internal carotid diameter with NASCET-based stenosis levels, using the diameter of the distal internal carotid lumen as the denominator for stenosis measurement. The following velocity measurements were obtained: RIGHT ICA:  Systolic 37 cm/sec, Diastolic 6 cm/sec CCA:  37 cm/sec SYSTOLIC ICA/CCA RATIO:  1.0 ECA:  37 cm/sec LEFT ICA:  Systolic 106 cm/sec, Diastolic 38 cm/sec CCA:  50 cm/sec SYSTOLIC ICA/CCA RATIO:  2.1 ECA:  72 cm/sec Right Brachial SBP: Not acquired Left Brachial SBP: Not acquired RIGHT CAROTID ARTERY: No significant calcifications of the right common carotid artery. Intermediate waveform maintained. Heterogeneous and partially calcified plaque at  the right carotid bifurcation. No significant lumen shadowing. Low resistance waveform of the right ICA, with parvus tardus configuration. No significant tortuosity. RIGHT VERTEBRAL ARTERY: Antegrade flow with low resistance waveform, with parvus tardus configuration. LEFT CAROTID ARTERY: No significant calcifications of the left common carotid artery. Intermediate waveform maintained. Heterogeneous and partially calcified plaque at the left carotid bifurcation without significant lumen shadowing. Low resistance waveform of the left ICA, with parvus tardus configuration. No significant tortuosity. LEFT VERTEBRAL ARTERY: Antegrade flow with low resistance waveform, with parvus tardus configuration. IMPRESSION: Color duplex indicates minimal heterogeneous and calcified plaque, with no hemodynamically significant stenosis by duplex criteria in the extracranial cerebrovascular circulation. The parvus tardus configuration throughout the cerebral vessels may reflect aortic stenosis. Correlation with ECHO recommended, if not already performed. Signed, Yvone Neu. Reyne Dumas, RPVI [facility] Electronically Signed   By: Gilmer Mor D.O.   On: 10/27/2017 08:05   Dg Femur Min 2 Views Right  Result Date: 10/26/2017 CLINICAL DATA:  Right  lower extremity pain and weakness, no reported injury EXAM: RIGHT FEMUR 2 VIEWS COMPARISON:  None. FINDINGS: There is no evidence of fracture or other focal bone lesions. Small superior right patellar enthesophyte. Vascular calcifications in the soft tissues. Surgical clip in soft tissues medial to the mid right femur. IMPRESSION: No fracture. Electronically Signed   By: Delbert Phenix M.D.   On: 10/26/2017 10:39     Assessment/Plan:  84 y.o. female with a known history of essential hypertension, GERD, arthritis, comes from  independent living secondary to right sided weakness.  Patient lives at independent facility and she is  very independent but started to have sudden onset of right leg weakness unable to ambulate.  Pt does have hx of A-fib but anticoagulation was stopped about a year ago.    -  Unable to obtain MRI due to PPM - Plegic on RUE and RLE - likely embolic related - Will repeat CTH today - Agree with anticoagulation but would like to wait about 7 days due to suspected L sided subcortical ischemia and to prevent hemorrhagic conversion   Grover Robinson  10/27/2017, 11:04 AM

## 2017-10-27 NOTE — Clinical Social Work Placement (Signed)
   CLINICAL SOCIAL WORK PLACEMENT  NOTE  Date:  10/27/2017  Patient Details  Name: Joanna Roberts MRN: [id number] Date of Birth: [birth year]  Clinical Social Work is seeking post-discharge placement for this patient at the Skilled  Nursing Facility level of care (*CSW will initial, date and re-position this form in  chart as items are completed):  Yes   Patient/family provided with [facility] Clinical Social Work Department's list of facilities offering this level of care within the geographic area requested by the patient (or if unable, by the patient's family).  Yes   Patient/family informed of their freedom to choose among providers that offer the needed level of care, that participate in Medicare, Medicaid or managed care program needed by the patient, have an available bed and are willing to accept the patient.  Yes   Patient/family informed of 's ownership interest in [facility] and [facility], as well as of the fact that they are under no obligation to receive care at these facilities.  PASRR submitted to EDS on 10/27/17     PASRR number received on       Existing PASRR number confirmed on 10/27/17     FL2 transmitted to all facilities in geographic area requested by pt/family on 10/27/17     FL2 transmitted to all facilities within larger geographic area on       Patient informed that his/her managed care company has contracts with or will negotiate with certain facilities, including the following:            Patient/family informed of bed offers received.  Patient chooses bed at       Physician recommends and patient chooses bed at      Patient to be transferred to   on  .  Patient to be transferred to facility by       Patient family notified on   of transfer.  Name of family member notified:        PHYSICIAN       Additional Comment:    _______________________________________________ Ruthe Mannan, LCSWA 10/27/2017, 2:37 PM

## 2017-10-27 NOTE — Progress Notes (Signed)
PT Cancellation Note  Patient Details Name: Joanna Roberts MRN: [id number] DOB: 1929-02-16   Cancelled Treatment:    Reason Eval/Treat Not Completed: Patient at procedure or test/unavailable  Attempt to see patient made. Pt. Going to CT scan. Will attempt again at a later time.    Precious Bard, PT, DPT   10/27/2017, 12:09 PM

## 2017-10-27 NOTE — Plan of Care (Signed)

## 2017-10-27 NOTE — Clinical Social Work Note (Signed)
Clinical Social Work Assessment  Patient Details  Name: Joanna Roberts MRN: [id number] Date of Birth: 01/18/1929  Date of referral:  10/27/17               Reason for consult:  Facility Placement                Permission sought to share information with:  Case Manager, Customer service manager, Family Supports Permission granted to share information::  Yes, Verbal Permission Granted  Name::      SNF  Agency::   [facility]   Relationship::     Contact Information:     Housing/Transportation Living arrangements for the past 2 months:  Charity fundraiser of Information:  Patient Patient Interpreter Needed:  None Criminal Activity/Legal Involvement Pertinent to Current Situation/Hospitalization:  No - Comment as needed Significant Relationships:  Adult Children Lives with:  Self Do you feel safe going back to the place where you live?  Yes Need for family participation in patient care:  No (Coment)  Care giving concerns:  Patient states that she lives at [facility].    Social Worker assessment / plan:  CSW found through chart review that OT has recommended SNF. PT has not evaluated yet. CSW met with patient to discuss discharge plan. Patient states that she lives at [facility] in [place]. She also states that she lives alone. CSW explained therapy recommendation. Patient states that she does not want to go to a facility but understands that she needs more assistance right now. CSW explained bed search process. Patient agreed to begin bed search. Patient states that her preference is [facility]. CSW will initiate bed search and give bed offers once received. CSW will follow for discharge planning.   Employment status:  Retired Nurse, adult PT Recommendations:  Not assessed at this time Information / Referral to community resources:  [place]  Patient/Family's Response to care:  Patient thanked CSW  for assistance   Patient/Family's Understanding of and Emotional Response to Diagnosis, Current Treatment, and Prognosis:  Patient states that her daughter is supportive and assists her as needed.   Emotional Assessment Appearance:  Appears stated age Attitude/Demeanor/Rapport:    Affect (typically observed):  Accepting, Pleasant Orientation:  Oriented to Self, Oriented to Place, Oriented to  Time Alcohol / Substance use:  Not Applicable Psych involvement (Current and /or in the community):  No (Comment)  Discharge Needs  Concerns to be addressed:  Discharge Planning Concerns Readmission within the last 30 days:  No Current discharge risk:  None Barriers to Discharge:  Continued Medical Work up   [employer], [place] 10/27/2017, 12:35 PM

## 2017-10-27 NOTE — Progress Notes (Signed)
[facility] - [place] at [facility]   PATIENT NAME: Joanna Roberts    MR#:  [id number]  DATE OF BIRTH:  [birth year]  SUBJECTIVE: Admitted for right legweakness that started yesterday morning, patient noted to have right hand weakness that started since last night after she came to the room.  Patient has history of chronic atrial fibrillation but taken off anticoagulation secondary to concern for hematoma that developed after pacemaker defibrillator generator change but now she comes with left-sided paralysis unable to get MRI of the brain due to pacemaker initial CT of the head did not show acute stroke but her clinical exam shows she is flaccid on her right side.  Patient speech is clear.  CHIEF COMPLAINT:   Chief Complaint  Patient presents with  . Extremity Weakness    REVIEW OF SYSTEMS:   ROS CONSTITUTIONAL: No fever, fatigue or weakness.  EYES: No blurred or double vision.  EARS, NOSE, AND THROAT: No tinnitus or ear pain.  RESPIRATORY: Mild shortness of breath today. CARDIOVASCULAR: No chest pain, orthopnea, edema.  GASTROINTESTINAL: No nausea, vomiting, diarrhea or abdominal pain.  GENITOURINARY: No dysuria, hematuria.  ENDOCRINE: No polyuria, nocturia,  HEMATOLOGY: No anemia, easy bruising or bleeding SKIN: No rash or lesion. MUSCULOSKELETAL: No joint pain or arthritis.   NEUROLOGIC: Patient has weakness on the right side.    PSYCHIATRY: No anxiety or depression.   DRUG ALLERGIES:   Allergies  Allergen Reactions  . Lortab [Hydrocodone-Acetaminophen] Itching, Swelling and Rash  . Doxycycline Diarrhea  . Macrobid [Nitrofurantoin Monohyd Macro] Diarrhea and Nausea And Vomiting  . Nitrofurantoin Diarrhea, Nausea Only and Nausea And Vomiting    VITALS:  Blood pressure (!) 139/93, pulse 74, temperature 98.2 F (36.8 C), temperature source Oral, resp. rate 18, height 5\' 4"  (1.626 m), weight 59.5 kg (131 lb 3.2 oz), SpO2 97 %.  PHYSICAL EXAMINATION:   GENERAL:  84 y.o.-year-old patient lying in the bed with no acute distress.  EYES: Pupils equal, round, reactive to light and accommodation. No scleral icterus. Extraocular muscles intact.  HEENT: Head atraumatic, normocephalic. Oropharynx and nasopharynx clear.  NECK:  Supple, no jugular venous distention. No thyroid enlargement, no tenderness.  LUNGS: Normal breath sounds bilaterally, no wheezing, rales,rhonchi or crepitation. No use of accessory muscles of respiration.  CARDIOVASCULAR: S1, S2 normal. No murmurs, rubs, or gallops.  ABDOMEN: Soft, nontender, nondistended. Bowel sounds present. No organomegaly or mass.  EXTREMITIES: No pedal edema, cyanosis, or clubbing.  NEUROLOGIC: Cranial nerves II through XII are intact. Muscle strength 1/5 right side, 5 /5 left side extremities. Sensation intact. Gait not checked.  PSYCHIATRIC: The patient is alert and oriented x 3.  SKIN: No obvious rash, lesion, or ulcer.    LABORATORY PANEL:   CBC Recent Labs  Lab 10/26/17 1007  WBC 3.7  HGB 10.7*  HCT 33.1*  PLT 125*   ------------------------------------------------------------------------------------------------------------------  Chemistries  Recent Labs  Lab 10/26/17 1007  NA 131*  K 4.3  CL 92*  CO2 33*  GLUCOSE 111*  BUN 26*  CREATININE 0.93  CALCIUM 9.2  AST 23  ALT 16  ALKPHOS 45  BILITOT 1.7*   ------------------------------------------------------------------------------------------------------------------  Cardiac Enzymes Recent Labs  Lab 10/26/17 1007  TROPONINI 0.03*   ------------------------------------------------------------------------------------------------------------------  RADIOLOGY:  Ct Head Wo Contrast  Result Date: 10/26/2017 CLINICAL DATA:  Right-sided weakness EXAM: CT HEAD WITHOUT CONTRAST TECHNIQUE: Contiguous axial images were obtained from the base of the skull through the vertex without intravenous contrast. COMPARISON:  09/29/2015  FINDINGS: Brain: Chronic atrophic and ischemic changes are identified and stable. Prior left lacunar infarct in the basal ganglia is noted. No findings to suggest acute hemorrhage, acute infarction or space-occupying mass lesion are noted. Vascular: No hyperdense vessel or unexpected calcification. Skull: Normal. Negative for fracture or focal lesion. Sinuses/Orbits: Mucosal changes are noted within the sphenoid sinus. Other: Some air is noted in the subcutaneous tissues of the face bilaterally likely related to retrograde filling from inadvertent air within an IV line. IMPRESSION: Chronic changes without acute intracranial abnormality. Mild sphenoid sinus disease. Electronically Signed   By: Alcide Clever M.D.   On: 10/26/2017 10:36   US Carotid Bilateral  Result Date: 10/27/2017 CLINICAL DATA:  84 year old female with a history of TIA. Cardiovascular risk factors include known stroke/TIA, known coronary artery disease EXAM: BILATERAL CAROTID DUPLEX ULTRASOUND TECHNIQUE: Wallace Cullens scale imaging, color Doppler and duplex ultrasound were performed of bilateral carotid and vertebral arteries in the neck. COMPARISON:  None. FINDINGS: Criteria: Quantification of carotid stenosis is based on velocity parameters that correlate the residual internal carotid diameter with NASCET-based stenosis levels, using the diameter of the distal internal carotid lumen as the denominator for stenosis measurement. The following velocity measurements were obtained: RIGHT ICA:  Systolic 37 cm/sec, Diastolic 6 cm/sec CCA:  37 cm/sec SYSTOLIC ICA/CCA RATIO:  1.0 ECA:  37 cm/sec LEFT ICA:  Systolic 106 cm/sec, Diastolic 38 cm/sec CCA:  50 cm/sec SYSTOLIC ICA/CCA RATIO:  2.1 ECA:  72 cm/sec Right Brachial SBP: Not acquired Left Brachial SBP: Not acquired RIGHT CAROTID ARTERY: No significant calcifications of the right common carotid artery. Intermediate waveform maintained. Heterogeneous and partially calcified plaque at the right carotid  bifurcation. No significant lumen shadowing. Low resistance waveform of the right ICA, with parvus tardus configuration. No significant tortuosity. RIGHT VERTEBRAL ARTERY: Antegrade flow with low resistance waveform, with parvus tardus configuration. LEFT CAROTID ARTERY: No significant calcifications of the left common carotid artery. Intermediate waveform maintained. Heterogeneous and partially calcified plaque at the left carotid bifurcation without significant lumen shadowing. Low resistance waveform of the left ICA, with parvus tardus configuration. No significant tortuosity. LEFT VERTEBRAL ARTERY: Antegrade flow with low resistance waveform, with parvus tardus configuration. IMPRESSION: Color duplex indicates minimal heterogeneous and calcified plaque, with no hemodynamically significant stenosis by duplex criteria in the extracranial cerebrovascular circulation. The parvus tardus configuration throughout the cerebral vessels may reflect aortic stenosis. Correlation with ECHO recommended, if not already performed. Signed, Yvone Neu. Reyne Dumas, RPVI [facility] Electronically Signed   By: Gilmer Mor D.O.   On: 10/27/2017 08:05   Dg Femur Min 2 Views Right  Result Date: 10/26/2017 CLINICAL DATA:  Right lower extremity pain and weakness, no reported injury EXAM: RIGHT FEMUR 2 VIEWS COMPARISON:  None. FINDINGS: There is no evidence of fracture or other focal bone lesions. Small superior right patellar enthesophyte. Vascular calcifications in the soft tissues. Surgical clip in soft tissues medial to the mid right femur. IMPRESSION: No fracture. Electronically Signed   By: Delbert Phenix M.D.   On: 10/26/2017 10:39    EKG:   Orders placed or performed during the hospital encounter of 10/26/17  . ED EKG  . ED EKG  . EKG 12-Lead  . EKG 12-Lead  . EKG 12-Lead  . EKG 12-Lead    ASSESSMENT AND PLAN:  D 84 year old female with multiple medical  problems of chronic systolic heart failure with EF 25 to 30%, chronic atrial fibrillation not on anticoagulation, GERD,  hypertension, hypothyroidism, chronic respiratory failure with oxygen 2 L at baseline comes in with [facility] independent living facility because of said weakness of the started yesterday morning and patient is admitted for evaluation of TIA./CVA. 1.  Acute right-sided weakness likely due to embolic stroke patient CT head unremarkable but unable to get MRI brain secondary to pacemaker.,  Patient clinically has weakness on the right side and flaccidity.  Looks like she has embolic stroke due to her chronic atrial fibrillation: Appreciate neurology, cardiology evaluation, continue aspirin, PT, OT consult, physical therapy recommended skilled nursing facility, patient ultrasound of carotids did not show hemodynamically significant stenosis.  Seen by neurology, recommend to wait 7 days secondary to acute stroke to prevent hemorrhage, continue aspirin. 2.  Chronic systolic heart failure with diffuse hypokinesia, EF 25 to 30%: Followed by Dr. Lady Gary from cardiology so cardiology consult is requested, patient has history of severe MR, TR, followed by CHF clinic.  Resumed home dose diuretics, continue Coreg 3.  Chronic atrial fibrillation, status post ICD, PPM placement.   All the records are reviewed and case discussed with Care Management/Social Workerr. Management plans discussed with the patient, family and they are in agreement.  CODE STATUS: DNR  TOTAL TIME TAKING CARE OF THIS PATIENT: 40 minutes minutes.   POSSIBLE D/C IN 2-3 DAYS, DEPENDING ON CLINICAL CONDITION. More than 50% of the time, spent in counseling, coordination of care, discussing with patient, reviewing the old charts.  Katha Hamming M.D on 10/27/2017 at 11:05 AM  Between 7am to 6pm - Pager - [phone]  After 6pm go to [email or web address] - password [email or web address] [facility]  [place] [facility]  Office   [phone]  CC: Primary care physician; Patrice Paradise, MD   Note: This dictation was prepared with Dragon dictation along with smaller phrase technology. Any transcriptional errors that result from this process are unintentional.

## 2017-10-27 NOTE — NC FL2 (Signed)
[place] MEDICAID FL2 LEVEL OF CARE SCREENING TOOL     IDENTIFICATION  Patient Name: Joanna Roberts Birthdate: 04/02/1929 Sex: female Admission Date (Current Location): 10/26/2017  Rafael Gonzalez and [place] Number:  Chiropodist and Address:  [facility], [address], [place], [place] [address]      Provider Number: [id number]  Attending Physician Name and Address:  Katha Hamming, MD  Relative Name and Phone Number:  Ginia Rudell- daughter   [phone]     Current Level of Care: Hospital Recommended Level of Care: Skilled Nursing Facility Prior Approval Number:    Date Approved/Denied:   PASRR Number: [id number]  Discharge Plan: SNF    Current Diagnoses: Patient Active Problem List   Diagnosis Date Noted  . Pressure injury of skin 10/27/2017  . Left leg weakness 10/26/2017  . CHF (congestive heart failure) (HCC) 10/20/2017  . Acute on chronic combined systolic and diastolic CHF (congestive heart failure) (HCC) 09/23/2017  . HTN (hypertension) 09/23/2017  . Ankle ulcer (HCC) 01/31/2017  . Carotid stenosis 01/31/2017  . Atherosclerosis of native arteries of the extremities with ulceration (HCC) 01/02/2017  . Varicose veins of both lower extremities 09/30/2016  . Lymphedema 07/29/2016  . Protein-calorie malnutrition, severe 06/03/2016  . Acute blood loss anemia 04/01/2016  . Hematoma of leg, right, initial encounter 03/23/2016  . Chronic mesenteric ischemia (HCC) 03/15/2016  . Atherosclerosis of native arteries of extremity with intermittent claudication (HCC) 02/12/2016  . Chronic venous insufficiency 02/12/2016  . DDD (degenerative disc disease), lumbar 11/18/2015  . Facet syndrome, lumbar 11/18/2015  . Sacroiliac joint dysfunction 11/18/2015  . Spinal stenosis, lumbar region, with neurogenic claudication 11/18/2015  . Lumbar radiculopathy 11/18/2015  . Compression fracture of thoracic spine, non-traumatic (HCC) 11/18/2015   . Idiopathic scoliosis 11/18/2015  . H/O varicose vein stripping 11/18/2015  . Acquired hypothyroidism 09/17/2015  . Cardiomyopathy (HCC) 09/17/2015  . Back pain, chronic 09/17/2015  . DD (diverticular disease) 09/17/2015  . Aggrieved 09/17/2015  . 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 History of colon polyps 09/17/2015  . Personal history of urinary infection 09/17/2015  . Cannot sleep 09/17/2015  . GERD (gastroesophageal reflux disease) 09/17/2015  . Tachycardia-bradycardia (HCC) 09/17/2015  . Atrial fibrillation, chronic (HCC) 03/18/2015  . B12 deficiency 11/27/2014  . Chronic anemia 11/26/2014  . Incomplete bladder emptying 06/24/2014  . Frequent UTI 06/24/2014  . History of urinary anomaly 06/21/2014  . FOM (frequency of micturition) 06/21/2014  . Chronic abdominal pain 01/17/2014  . CN (constipation) 01/17/2014  . Fatty infiltration of liver 01/17/2014  . Temporary cerebral vascular dysfunction 11/06/2013    Orientation RESPIRATION BLADDER Height & Weight     Self, Time, Place  O2(2 liters ) Continent Weight: 131 lb 3.2 oz (59.5 kg) Height:  5\' 4"  (162.6 cm)  BEHAVIORAL SYMPTOMS/MOOD NEUROLOGICAL BOWEL NUTRITION STATUS  (none) (none) Continent Diet(Heart Healthy )  AMBULATORY STATUS COMMUNICATION OF NEEDS Skin   Extensive Assist Verbally Other (Comment)(Stage 3 on right ankle)                       Personal Care Assistance Level of Assistance  Bathing, Feeding, Dressing Bathing Assistance: Limited assistance Feeding assistance: Independent Dressing Assistance: Limited assistance     Functional Limitations Info  Sight, Hearing, Speech Sight Info: Adequate Hearing Info: Adequate Speech Info: Adequate    SPECIAL CARE FACTORS FREQUENCY  PT (By licensed PT), OT (By licensed OT)     PT Frequency: 5x/week OT Frequency: 5x/week  Contractures Contractures Info: Not present    Additional Factors Info  Code Status, Allergies Code Status Info:  DNR Allergies Info: Lortab, Doxycycline, Macrobid, Nitrofurantoin           Current Medications (10/27/2017):  This is the current hospital active medication list Current Facility-Administered Medications  Medication Dose Route Frequency Provider Last Rate Last Dose  . acetaminophen (TYLENOL) tablet 650 mg  650 mg Oral Q6H PRN Katha Hamming, MD      . aspirin EC tablet 81 mg  81 mg Oral Daily Katha Hamming, MD      . carvedilol (COREG) tablet 6.25 mg  6.25 mg Oral BID Katha Hamming, MD   6.25 mg at 10/26/17 1811  . docusate sodium (COLACE) capsule 100 mg  100 mg Oral BID Katha Hamming, MD   100 mg at 10/27/17 1017  . enoxaparin (LOVENOX) injection 40 mg  40 mg Subcutaneous Q24H Katha Hamming, MD   40 mg at 10/26/17 2115  . furosemide (LASIX) tablet 40 mg  40 mg Oral Daily Katha Hamming, MD   40 mg at 10/27/17 1017  . gabapentin (NEURONTIN) capsule 300 mg  300 mg Oral BID Katha Hamming, MD   300 mg at 10/27/17 1017  . levalbuterol (XOPENEX) nebulizer solution 0.63 mg  0.63 mg Inhalation Q6H PRN Katha Hamming, MD      . levothyroxine (SYNTHROID, LEVOTHROID) tablet 25 mcg  25 mcg Oral QAC breakfast Katha Hamming, MD   25 mcg at 10/27/17 0653  . nitroGLYCERIN (NITROSTAT) SL tablet 0.4 mg  0.4 mg Sublingual Q5 min PRN Katha Hamming, MD      . oxyCODONE-acetaminophen (PERCOCET/ROXICET) 5-325 MG per tablet 1 tablet  1 tablet Oral Q6H PRN Katha Hamming, MD   1 tablet at 10/26/17 1926  . temazepam (RESTORIL) capsule 7.5 mg  7.5 mg Oral QHS Katha Hamming, MD   7.5 mg at 10/26/17 2115  . vitamin B-12 (CYANOCOBALAMIN) tablet 1,000 mcg  1,000 mcg Oral Daily Katha Hamming, MD   1,000 mcg at 10/27/17 1017     Discharge Medications: Please see discharge summary for a list of discharge medications.  Relevant Imaging Results:  Relevant Lab Results:   Additional Information SSN: [id number]  Ruthe Mannan,  [place]

## 2017-10-28 ENCOUNTER — Inpatient Hospital Stay: Payer: PPO

## 2017-10-28 ENCOUNTER — Observation Stay: Payer: PPO

## 2017-10-28 DIAGNOSIS — G459 Transient cerebral ischemic attack, unspecified: Secondary | ICD-10-CM | POA: Diagnosis present

## 2017-10-28 DIAGNOSIS — R627 Adult failure to thrive: Secondary | ICD-10-CM | POA: Diagnosis present

## 2017-10-28 DIAGNOSIS — M810 Age-related osteoporosis without current pathological fracture: Secondary | ICD-10-CM | POA: Diagnosis present

## 2017-10-28 DIAGNOSIS — K225 Diverticulum of esophagus, acquired: Secondary | ICD-10-CM | POA: Diagnosis present

## 2017-10-28 DIAGNOSIS — E43 Unspecified severe protein-calorie malnutrition: Secondary | ICD-10-CM | POA: Diagnosis present

## 2017-10-28 DIAGNOSIS — L89513 Pressure ulcer of right ankle, stage 3: Secondary | ICD-10-CM | POA: Diagnosis not present

## 2017-10-28 DIAGNOSIS — I350 Nonrheumatic aortic (valve) stenosis: Secondary | ICD-10-CM | POA: Diagnosis present

## 2017-10-28 DIAGNOSIS — H269 Unspecified cataract: Secondary | ICD-10-CM | POA: Diagnosis present

## 2017-10-28 DIAGNOSIS — E039 Hypothyroidism, unspecified: Secondary | ICD-10-CM | POA: Diagnosis present

## 2017-10-28 DIAGNOSIS — I110 Hypertensive heart disease with heart failure: Secondary | ICD-10-CM | POA: Diagnosis present

## 2017-10-28 DIAGNOSIS — Z8673 Personal history of transient ischemic attack (TIA), and cerebral infarction without residual deficits: Secondary | ICD-10-CM | POA: Diagnosis not present

## 2017-10-28 DIAGNOSIS — G8191 Hemiplegia, unspecified affecting right dominant side: Secondary | ICD-10-CM | POA: Diagnosis present

## 2017-10-28 DIAGNOSIS — R011 Cardiac murmur, unspecified: Secondary | ICD-10-CM | POA: Diagnosis present

## 2017-10-28 DIAGNOSIS — I252 Old myocardial infarction: Secondary | ICD-10-CM | POA: Diagnosis not present

## 2017-10-28 DIAGNOSIS — Z66 Do not resuscitate: Secondary | ICD-10-CM | POA: Diagnosis present

## 2017-10-28 DIAGNOSIS — F329 Major depressive disorder, single episode, unspecified: Secondary | ICD-10-CM | POA: Diagnosis present

## 2017-10-28 DIAGNOSIS — I429 Cardiomyopathy, unspecified: Secondary | ICD-10-CM | POA: Diagnosis present

## 2017-10-28 DIAGNOSIS — R531 Weakness: Secondary | ICD-10-CM | POA: Diagnosis not present

## 2017-10-28 DIAGNOSIS — I482 Chronic atrial fibrillation: Secondary | ICD-10-CM | POA: Diagnosis present

## 2017-10-28 DIAGNOSIS — I6389 Other cerebral infarction: Secondary | ICD-10-CM | POA: Diagnosis not present

## 2017-10-28 DIAGNOSIS — I5022 Chronic systolic (congestive) heart failure: Secondary | ICD-10-CM | POA: Diagnosis present

## 2017-10-28 DIAGNOSIS — R29898 Other symptoms and signs involving the musculoskeletal system: Secondary | ICD-10-CM

## 2017-10-28 DIAGNOSIS — K219 Gastro-esophageal reflux disease without esophagitis: Secondary | ICD-10-CM | POA: Diagnosis present

## 2017-10-28 DIAGNOSIS — Z95810 Presence of automatic (implantable) cardiac defibrillator: Secondary | ICD-10-CM | POA: Diagnosis not present

## 2017-10-28 DIAGNOSIS — I63422 Cerebral infarction due to embolism of left anterior cerebral artery: Secondary | ICD-10-CM | POA: Diagnosis present

## 2017-10-28 DIAGNOSIS — Z515 Encounter for palliative care: Secondary | ICD-10-CM | POA: Diagnosis not present

## 2017-10-28 DIAGNOSIS — Z6822 Body mass index (BMI) 22.0-22.9, adult: Secondary | ICD-10-CM | POA: Diagnosis not present

## 2017-10-28 DIAGNOSIS — Z90710 Acquired absence of both cervix and uterus: Secondary | ICD-10-CM | POA: Diagnosis not present

## 2017-10-28 DIAGNOSIS — I639 Cerebral infarction, unspecified: Secondary | ICD-10-CM | POA: Diagnosis not present

## 2017-10-28 DIAGNOSIS — M1990 Unspecified osteoarthritis, unspecified site: Secondary | ICD-10-CM | POA: Diagnosis present

## 2017-10-28 MED ORDER — ENSURE ENLIVE PO LIQD
237.0000 mL | ORAL | Status: DC
  Administered 2017-10-29: 237 mL via ORAL

## 2017-10-28 NOTE — Progress Notes (Signed)
Occupational Therapy Treatment Patient Details Name: Joanna Roberts MRN: [id number] DOB: 1928/09/10 Today's Date: 10/28/2017    History of present illness Joanna Roberts is an 84yo female who comes [facility] on 7/[facility] after sudden insidious RLE paralysis. Head CT negative, but pt is being worked up for TIA v CVA at this time. Pt is pending doppler to r/o DVT hence exam is limited on this date. Since admission, pt has had progressive loss of RUE as well and nwo speech changes as of 7/5. PTA pt lived at [facility] AMB with rollator, independent in ADL.    OT comments  Pt seen for OT tx session this date. Spoke with MD prior to session and pt cleared to work with OT. Of note, repeat head CT reveals Acute infarct in the anterior superior left frontal lobe with minimal extension to the right of the falx in the medial anterior right frontal lobe. Pt visiting with friend in the room, agreeable to OT. Pt with flaccid RUE and RLE this date. Impaired sensation continues. With PROM, spasticity noted with elbow flexion. Neuro re-ed PNF self/AAROM with D1/D2 flex/ext. Pt denies pain t/o session. Pt educated in positioning of RUE to minimize risk of edema, optimal functional resting position, and minimizing risk of injury to the RUE. Pt and friend in room verbalized understanding. Pt continues to benefit from skilled OT services to address noted impairments and functional deficits to maximize independence and minimize caregiver burden.   Follow Up Recommendations  SNF    Equipment Recommendations  Other (comment)(TBD)    Recommendations for Other Services      Precautions / Restrictions Precautions Precautions: Fall Restrictions Weight Bearing Restrictions: No       Mobility Bed Mobility                  Transfers                      Balance                                           ADL either performed or assessed with clinical judgement   ADL Overall ADL's :  Needs assistance/impaired                                             Vision Baseline Vision/History: Wears glasses Wears Glasses: At all times Patient Visual Report: No change from baseline Vision Assessment?: No apparent visual deficits   Perception     Praxis      Cognition Arousal/Alertness: Awake/alert Behavior During Therapy: WFL for tasks assessed/performed Overall Cognitive Status: Within Functional Limits for tasks assessed                                          Exercises Other Exercises Other Exercises: Pt RUE with full PROM, spasticity noted with elbow flexion, no edema. Other Exercises: Neuro re-ed self/AAROM for RUE with PNF patterns D1/D2 flexion/extension Other Exercises: Pt educated in positioning of RUE to minimize risk of edema, contracture, and shoulder subluxation. RUE positioned on pillows.   Shoulder Instructions       General Comments  Pertinent Vitals/ Pain       Pain Assessment: No/denies pain Pain Intervention(s): Monitored during session;Repositioned  Home Living                                          Prior Functioning/Environment              Frequency  Min 2X/week        Progress Toward Goals  OT Goals(current goals can now be found in the care plan section)  Progress towards OT goals: OT to reassess next treatment  Acute Rehab OT Goals Patient Stated Goal: to return to PLOF OT Goal Formulation: With patient Time For Goal Achievement: 11/09/17 Potential to Achieve Goals: Fair  Plan Discharge plan remains appropriate;Frequency remains appropriate    Co-evaluation                 AM-PAC PT "6 Clicks" Daily Activity     Outcome Measure   Help from another person eating meals?: A Little Help from another person taking care of personal grooming?: A Little Help from another person toileting, which includes using toliet, bedpan, or urinal?: A Lot Help  from another person bathing (including washing, rinsing, drying)?: A Lot Help from another person to put on and taking off regular upper body clothing?: A Lot Help from another person to put on and taking off regular lower body clothing?: A Lot 6 Click Score: 14    End of Session    OT Visit Diagnosis: Other abnormalities of gait and mobility (R26.89);History of falling (Z91.81);Muscle weakness (generalized) (M62.81);Hemiplegia and hemiparesis Hemiplegia - Right/Left: Right Hemiplegia - dominant/non-dominant: Dominant Hemiplegia - caused by: Cerebral infarction   Activity Tolerance Patient tolerated treatment well   Patient Left in bed;with call bell/phone within reach;with bed alarm set;with family/visitor present;Other (comment)(staff in to take pt for testing)   Nurse Communication          Time: [phone] OT Time Calculation (min): 19 min  Charges: OT General Charges $OT Visit: 1 Visit OT Treatments $Neuromuscular Re-education: 8-22 mins   Richrd Prime, MPH, MS, OTR/L ascom [phone] 10/28/17, 3:43 PM

## 2017-10-28 NOTE — Progress Notes (Signed)
SUBJECTIVE: Patient having slurred speech today with history of CVA   Vitals:   10/27/17 2036 10/28/17 0000 10/28/17 0427 10/28/17 0810  BP: 135/82 132/78 117/78 121/75  Pulse: 78 80 70 78  Resp: 20 18 18 16   Temp: 97.8 F (36.6 C) 98.1 F (36.7 C) 98.2 F (36.8 C) 98.2 F (36.8 C)  TempSrc: Oral Oral Oral Oral  SpO2: 96% 97% 98% 92%  Weight:      Height:        Intake/Output Summary (Last 24 hours) at 10/28/2017 0912 Last data filed at 10/27/2017 1608 Gross per 24 hour  Intake 240 ml  Output 450 ml  Net -210 ml    LABS: Basic Metabolic Panel: Recent Labs    10/26/17 1007  NA 131*  K 4.3  CL 92*  CO2 33*  GLUCOSE 111*  BUN 26*  CREATININE 0.93  CALCIUM 9.2   Liver Function Tests: Recent Labs    10/26/17 1007  AST 23  ALT 16  ALKPHOS 45  BILITOT 1.7*  PROT 6.6  ALBUMIN 3.6   No results for input(s): LIPASE, AMYLASE in the last 72 hours. CBC: Recent Labs    10/26/17 1007  WBC 3.7  NEUTROABS 2.6  HGB 10.7*  HCT 33.1*  MCV 96.6  PLT 125*   Cardiac Enzymes: Recent Labs    10/26/17 1007  TROPONINI 0.03*   BNP: Invalid input(s): POCBNP D-Dimer: No results for input(s): DDIMER in the last 72 hours. Hemoglobin A1C: Recent Labs    10/27/17 0404  HGBA1C 5.5   Fasting Lipid Panel: Recent Labs    10/27/17 0404  CHOL 117  HDL 39*  LDLCALC 68  TRIG 51  CHOLHDL 3.0   Thyroid Function Tests: No results for input(s): TSH, T4TOTAL, T3FREE, THYROIDAB in the last 72 hours.  Invalid input(s): FREET3 Anemia Panel: No results for input(s): VITAMINB12, FOLATE, FERRITIN, TIBC, IRON, RETICCTPCT in the last 72 hours.   PHYSICAL EXAM General: Well developed, well nourished, in no acute distress HEENT:  Normocephalic and atramatic Neck:  No JVD.  Lungs: Clear bilaterally to auscultation and percussion. Heart: HRRR . Normal S1 and S2 without gallops or murmurs.  Abdomen: Bowel sounds are positive, abdomen soft and non-tender  Msk:  Back normal,  normal gait. Normal strength and tone for age. Extremities: No clubbing, cyanosis or edema.   Neuro: Alert and oriented X 3. Psych:  Good affect, responds appropriately  TELEMETRY: VVI paced rhythm  ASSESSMENT AND PLAN: History of congestive heart failure which is chronic resented to the hospital with possible CVA and is having slurred speech today. She also has left-sided weakness. He shouldn't has severe aortic stenosis with severe LV dysfunction. Advise conservative treatment.  Active Problems:   Left leg weakness   Pressure injury of skin    Raygen Linquist A, MD, [facility] 10/28/2017 9:12 AM

## 2017-10-28 NOTE — Clinical Social Work Note (Signed)
CSW spoke with patient regarding bed offers. Patient states she is unable to make the decision and to call POA. CSW found that according the paperwork we have that patient's daughter is [facility] POA Krystiana Fornes [phone]. CSW contacted patient's daughter and explained the reason for the call. Daughter states that she is no longer POA for her mother and that CSW needs to contact patient's friend Thomes Dinning who now has POA. CSW contacted Thomes Dinning [phone] and explained reason for call and gave bed offers. Onalee Hua would like patient to go to [facility]. CSW will begin HTA authorization. CSW will continue to follow for discharge planning.   Ruthe Mannan MSW, [address] [phone]

## 2017-10-28 NOTE — Progress Notes (Signed)
Notified Dr. Luberta Mutter of patient complaining of increased right sided weakness, received verbal order for stat CT scan of the head.

## 2017-10-28 NOTE — Progress Notes (Signed)
OT Cancellation Note  Patient Details Name: Joanna Roberts MRN: [id number] DOB: [birth year]   Cancelled Treatment:    Reason Eval/Treat Not Completed: Patient at procedure or test/ unavailable. Spoke with MD who provided verbal permission to work with pt. Pt being taken out of room for head CT. Will re-attempt OT tx later this date as medically appropriate.   Richrd Prime, MPH, MS, OTR/L ascom [phone] 10/28/17, 10:52 AM

## 2017-10-28 NOTE — Progress Notes (Addendum)
Speech Language Pathology Treatment: Dysphagia  Patient Details Name: Joanna Roberts MRN: [id number] DOB: 07/11/1928 Today's Date: 10/28/2017 Time: 4270-6237 SLP Time Calculation (min) (ACUTE ONLY): 60 min  Assessment / Plan / Recommendation Clinical Impression  Pt seen today per NSG request as f/u w/ her toleration of an oral diet d/t NSG concern of increasing RUE weakness, concern for dysphagia; speech seems slightly more rushed and mumbled. Pt was awake, verbally conversive w/ SLP but noted the mumbled speech - encouraaged pt to slow down and over-articulate. Pt required FULL support and positioning w/ pillows to sit fully midline in bed for oral intake. Per Head CT today, Acute infarct in the anterior superior left frontal lobe with minimal extension to the right of the falx in the medial anterior right frontal lobe.  Pt appears to present w/ adequate oropharyngeal phase swallow function w/ no gross, significant oral phase deficits note, however, slight-min slower bolus management w/ min attention needed to clear bolus material on the R side orally. She consumed po trials of thin liquids (VIA CUP) and purees and softened solids finishing ~80% of her lunch meal - held the cup herself for drinking the tea/water drinks. No overt s/s of aspiration were noted; clear vocal quality and no decline in respiratory status noted during/post trials. Pt exhibited adequate timing of the swallowing w/ these trials. Oral phase appeared grossly WFL during bolus management and oral clearing w/ these consistencies when attending to the bolus trials; timely bolus A-P transfer was noted w/ trials; lingual sweep and alternating foods/liquids appeared to aid overall clearing during the oral phase management during the meal. Pt did NOT present w/ any gross unilateral, OM weakness (on the R side) during OM exam and w/ trials. Pt fed self holding the Cup to drink but REQUIRED FULL FEEDING ASSISTANCE w/ foods allowing time b/t  trials.  Pt's speech was clear w/ majority of responses when she responded to SLP - she talked some during chewing and educated on NOT doing this to reduce risk for choking. Pt's speech was min more rushed and mumbled when not slowing down and taking her time. Suspect pt's recent CVA w/ worsening presentation could impact her verbal communication engagement. Pt is able to follow through w/ general tasks and self-feeding w/ cues. Recommend a Mech Soft diet w/ moistened foods w/ thin liquids(cup drinking); general aspiration precautions and Supervision during ALL Meals. Pills Whole w/ Puree w/ pt participating in self-drinking for safer swallowing. NSG updated and agreed. Pt agreed.     HPI HPI: Pt is a 84 y.o. female with a history of anemia, arthritis, CHF (EF 25-30%), GERD, hypertension, hypothyroidism, MI, on 2L O2 at baseline, who presents to the ED for right leg weakness that started this am and was unable to walk.  Patient arrives from [facility] [place].  No facial droop or speech issues.  She was last known normal when she went to bed that night.  She denies any recent illness or other complaints.  Patient lives at independent facility and she is very independent but started to have sudden onset of right leg weakness unable to ambulate.  Pt also c/o being unable to use her R arm this morning at this evaluation.  OT/PT following.  Pt is on a mech soft diet since admission ~2 days ago. Pt has c/o intermittent coughing when drinking liquids at home but did not report an increase if frequency since admission. NSG reported increased R sided weakness during this admission and concern  for dysphagia.        SLP Plan  Continue with current plan of care - education w/ caregivers, staff on feeding support and precautions       Recommendations  Diet recommendations: Dysphagia 3 (mechanical soft);Thin liquid Liquids provided via: Cup;No straw Medication Administration: Whole meds with puree(for safer  swallowing, clearing) Supervision: Patient able to self feed;Full supervision/cueing for compensatory strategies;Trained caregiver to feed patient(d/t RUE weakness - can hold cup to drink) Compensations: Minimize environmental distractions;Slow rate;Small sips/bites;Lingual sweep for clearance of pocketing;Follow solids with liquid Postural Changes and/or Swallow Maneuvers: Seated upright 90 degrees;Upright 30-60 min after meal                General recommendations: (Dietician f/u) Oral Care Recommendations: Oral care BID;Staff/trained caregiver to provide oral care Follow up Recommendations: None(education for caregivers/staff on feeding support/precs.) SLP Visit Diagnosis: Dysphagia, oral phase (R13.11)(slight-min) Plan: Continue with current plan of care       GO                Jerilynn Som, MS, CCC-SLP Lothar Prehn 10/28/2017, 6:27 PM

## 2017-10-28 NOTE — Plan of Care (Signed)

## 2017-10-28 NOTE — Progress Notes (Signed)
OT Cancellation Note  Patient Details Name: Joanna Roberts MRN: [id number] DOB: 1928-06-08   Cancelled Treatment:    Reason Eval/Treat Not Completed: Medical issues which prohibited therapy. Chart reviewed. Held previous date 2/2 cardiology recommending dopplers to rule out DVT and an echocardiogram to evaluate L ventricular EF. Dopplers and echo not noted in chart. Will speak with RN regarding testing and continue to hold OT until dopplers are completed and if negative for DVT.    Richrd Prime, MPH, MS, OTR/L ascom [phone] 10/28/17, 7:45 AM

## 2017-10-28 NOTE — Consult Note (Signed)
R sided weakness   Past Medical History:  Diagnosis Date  . AICD (automatic cardioverter/defibrillator) present   . Allergy   . Anemia   . Arthritis   . Cataract   . CHF (congestive heart failure) (HCC)   . Depression   . Dysrhythmia   . GERD (gastroesophageal reflux disease)   . Heart murmur   . History of TIAs   . Hypertension   . Hypothyroidism   . Myocardial infarction (HCC)   . Osteoporosis   . Presence of permanent cardiac pacemaker   . Thyroid disease     Past Surgical History:  Procedure Laterality Date  . ABDOMINAL HYSTERECTOMY    . APPENDECTOMY    . HERNIA REPAIR    . IMPLANTABLE CARDIOVERTER DEFIBRILLATOR (ICD) GENERATOR CHANGE Left 07/27/2017   Procedure: ICD GENERATOR CHANGE;  Surgeon: Marcina Millard, MD;  Location: [facility] ORS;  Service: Cardiovascular;  Laterality: Left;  . INSERT / REPLACE / REMOVE PACEMAKER    . LOWER EXTREMITY ANGIOGRAPHY Left 02/08/2017   Procedure: Lower Extremity Angiography;  Surgeon: Renford Dills, MD;  Location: [facility] INVASIVE CV LAB;  Service: Cardiovascular;  Laterality: Left;  . PERIPHERAL VASCULAR CATHETERIZATION Right 10/08/2014   Procedure: Lower Extremity Angiography;  Surgeon: Renford Dills, MD;  Location: [facility]VAS[facility] CV [facility];  Service: Cardiovascular;  Laterality: Right;  . PERIPHERAL VASCULAR CATHETERIZATION  10/08/2014   Procedure: Lower Extremity Intervention;  Surgeon: Renford Dills, MD;  Location: [facility] INV[facility] CV [facility];  Service: Cardiovascular;;  . PERIPHERAL VASCULAR CATHETERIZATION N/A 03/23/2016   Procedure: Visceral Angiography;  Surgeon: Renford Dills, MD;  Location: [facility] INV[facility] CV [facility];  Service: Cardiovascular;  Laterality: N/A;  . pessary      Family History  Problem Relation Age of Onset  . Early death Mother   . Heart disease Mother   . Arthritis Father   . Asthma Father   . Stroke Sister     Social History:  reports that she has never smoked. She has never used smokeless tobacco. She  reports that she does not drink alcohol or use drugs.  Allergies  Allergen Reactions  . Lortab [Hydrocodone-Acetaminophen] Itching, Swelling and Rash  . Doxycycline Diarrhea  . Macrobid [Nitrofurantoin Monohyd Macro] Diarrhea and Nausea And Vomiting  . Nitrofurantoin Diarrhea, Nausea Only and Nausea And Vomiting     Physical Examination: Blood pressure 121/75, pulse 78, temperature 98.2 F (36.8 C), temperature source Oral, resp. rate 16, height 5\' 4"  (1.626 m), weight 131 lb 3.2 oz (59.5 kg), SpO2 (!) 84 %.   Neurological Examination   Mental Status: Alert, oriented, thought content appropriate.  Speech fluent without evidence of aphasia.  Able to follow 3 step commands without difficulty. Cranial Nerves: II: Discs flat bilaterally; Visual fields grossly normal, pupils equal, round, reactive to light and accommodation III,IV, VI: ptosis not present, extra-ocular motions intact bilaterally V,VII: smile symmetric, facial light touch sensation normal bilaterally VIII: hearing normal bilaterally IX,X: gag reflex present XI: bilateral shoulder shrug XII: midline tongue extension Motor: Right : Upper extremity   0/5    Left:     Upper extremity   5/5  Lower extremity   0/5     Lower extremity   5/5 Tone and bulk:normal tone throughout; no atrophy noted Sensory: Pinprick and light touch intact throughout, bilaterally Deep Tendon Reflexes: 1+ and symmetric throughout Plantars: Right: downgoing   Left: downgoing Cerebellar: normal finger-to-nose, normal rapid alternating movements and normal heel-to-shin test Gait: not tested  Laboratory Studies:   Basic Metabolic Panel: Recent Labs  Lab 10/26/17 1007  NA 131*  K 4.3  CL 92*  CO2 33*  GLUCOSE 111*  BUN 26*  CREATININE 0.93  CALCIUM 9.2    Liver Function Tests: Recent Labs  Lab 10/26/17 1007  AST 23  ALT 16  ALKPHOS 45  BILITOT 1.7*  PROT 6.6  ALBUMIN 3.6   No results for input(s): LIPASE, AMYLASE in the  last 168 hours. No results for input(s): AMMONIA in the last 168 hours.  CBC: Recent Labs  Lab 10/26/17 1007  WBC 3.7  NEUTROABS 2.6  HGB 10.7*  HCT 33.1*  MCV 96.6  PLT 125*    Cardiac Enzymes: Recent Labs  Lab 10/26/17 1007  TROPONINI 0.03*    BNP: Invalid input(s): POCBNP  CBG: No results for input(s): GLUCAP in the last 168 hours.  Microbiology: Results for orders placed or performed during the hospital encounter of 09/23/17  MRSA PCR Screening     Status: None   Collection Time: 09/24/17  2:25 AM  Result Value Ref Range Status   MRSA by PCR NEGATIVE NEGATIVE Final    Comment:        The GeneXpert MRSA Assay (FDA approved for NASAL specimens only), is one component of a comprehensive MRSA colonization surveillance program. It is not intended to diagnose MRSA infection nor to guide or monitor treatment for MRSA infections. Performed at [facility], [address] [place], [place] [address]     Coagulation Studies: No results for input(s): LABPROT, INR in the last 72 hours.  Urinalysis:  Recent Labs  Lab 10/26/17 1117  COLORURINE YELLOW*  LABSPEC 1.014  PHURINE 6.0  GLUCOSEU NEGATIVE  HGBUR NEGATIVE  BILIRUBINUR NEGATIVE  KETONESUR NEGATIVE  PROTEINUR NEGATIVE  NITRITE NEGATIVE  LEUKOCYTESUR TRACE*    Lipid Panel:     Component Value Date/Time   CHOL 117 10/27/2017 0404   CHOL 136 11/02/2013 0540   TRIG 51 10/27/2017 0404   TRIG 85 11/02/2013 0540   HDL 39 (L) 10/27/2017 0404   HDL 34 (L) 11/02/2013 0540   CHOLHDL 3.0 10/27/2017 0404   VLDL 10 10/27/2017 0404   VLDL 17 11/02/2013 0540   LDLCALC 68 10/27/2017 0404   LDLCALC 85 11/02/2013 0540    HgbA1C:  Lab Results  Component Value Date   HGBA1C 5.5 10/27/2017     Other results: EKG: .Afib with normal rate   Imaging: Ct Head Wo Contrast  Result Date: 10/27/2017 CLINICAL DATA:  Persistent right-sided weakness EXAM: CT HEAD WITHOUT CONTRAST TECHNIQUE:  Contiguous axial images were obtained from the base of the skull through the vertex without intravenous contrast. COMPARISON:  10/26/2017 FINDINGS: Brain: Chronic atrophic and ischemic changes without acute abnormality. No findings to suggest acute hemorrhage, acute infarction or space-occupying mass lesion are noted. Vascular: No hyperdense vessel or unexpected calcification. Skull: Normal. Negative for fracture or focal lesion. Sinuses/Orbits: Stable mucosal thickening in the sphenoid sinus. Other: Previously seen venous air has resolved in the interval from the prior exam. IMPRESSION: Chronic atrophic and ischemic changes without acute abnormality. Electronically Signed   By: Alcide Clever M.D.   On: 10/27/2017 11:36   Ct Head Wo Contrast  Result Date: 10/26/2017 CLINICAL DATA:  Right-sided weakness EXAM: CT HEAD WITHOUT CONTRAST TECHNIQUE: Contiguous axial images were obtained from the base of the skull through the vertex without intravenous contrast. COMPARISON:  09/29/2015 FINDINGS: Brain: Chronic atrophic and ischemic changes are identified and stable. Prior  left lacunar infarct in the basal ganglia is noted. No findings to suggest acute hemorrhage, acute infarction or space-occupying mass lesion are noted. Vascular: No hyperdense vessel or unexpected calcification. Skull: Normal. Negative for fracture or focal lesion. Sinuses/Orbits: Mucosal changes are noted within the sphenoid sinus. Other: Some air is noted in the subcutaneous tissues of the face bilaterally likely related to retrograde filling from inadvertent air within an IV line. IMPRESSION: Chronic changes without acute intracranial abnormality. Mild sphenoid sinus disease. Electronically Signed   By: Alcide Clever M.D.   On: 10/26/2017 10:36   US Carotid Bilateral  Result Date: 10/27/2017 CLINICAL DATA:  84 year old female with a history of TIA. Cardiovascular risk factors include known stroke/TIA, known coronary artery disease EXAM: BILATERAL  CAROTID DUPLEX ULTRASOUND TECHNIQUE: Wallace Cullens scale imaging, color Doppler and duplex ultrasound were performed of bilateral carotid and vertebral arteries in the neck. COMPARISON:  None. FINDINGS: Criteria: Quantification of carotid stenosis is based on velocity parameters that correlate the residual internal carotid diameter with NASCET-based stenosis levels, using the diameter of the distal internal carotid lumen as the denominator for stenosis measurement. The following velocity measurements were obtained: RIGHT ICA:  Systolic 37 cm/sec, Diastolic 6 cm/sec CCA:  37 cm/sec SYSTOLIC ICA/CCA RATIO:  1.0 ECA:  37 cm/sec LEFT ICA:  Systolic 106 cm/sec, Diastolic 38 cm/sec CCA:  50 cm/sec SYSTOLIC ICA/CCA RATIO:  2.1 ECA:  72 cm/sec Right Brachial SBP: Not acquired Left Brachial SBP: Not acquired RIGHT CAROTID ARTERY: No significant calcifications of the right common carotid artery. Intermediate waveform maintained. Heterogeneous and partially calcified plaque at the right carotid bifurcation. No significant lumen shadowing. Low resistance waveform of the right ICA, with parvus tardus configuration. No significant tortuosity. RIGHT VERTEBRAL ARTERY: Antegrade flow with low resistance waveform, with parvus tardus configuration. LEFT CAROTID ARTERY: No significant calcifications of the left common carotid artery. Intermediate waveform maintained. Heterogeneous and partially calcified plaque at the left carotid bifurcation without significant lumen shadowing. Low resistance waveform of the left ICA, with parvus tardus configuration. No significant tortuosity. LEFT VERTEBRAL ARTERY: Antegrade flow with low resistance waveform, with parvus tardus configuration. IMPRESSION: Color duplex indicates minimal heterogeneous and calcified plaque, with no hemodynamically significant stenosis by duplex criteria in the extracranial cerebrovascular circulation. The parvus tardus configuration throughout the cerebral vessels may reflect  aortic stenosis. Correlation with ECHO recommended, if not already performed. Signed, Yvone Neu. Reyne Dumas, RPVI [facility] Electronically Signed   By: Gilmer Mor D.O.   On: 10/27/2017 08:05   Dg Femur Min 2 Views Right  Result Date: 10/26/2017 CLINICAL DATA:  Right lower extremity pain and weakness, no reported injury EXAM: RIGHT FEMUR 2 VIEWS COMPARISON:  None. FINDINGS: There is no evidence of fracture or other focal bone lesions. Small superior right patellar enthesophyte. Vascular calcifications in the soft tissues. Surgical clip in soft tissues medial to the mid right femur. IMPRESSION: No fracture. Electronically Signed   By: Delbert Phenix M.D.   On: 10/26/2017 10:39     Assessment/Plan:  84 y.o. female with a known history of essential hypertension, GERD, arthritis, comes from  independent living secondary to right sided weakness.  Patient lives at independent facility and she is very independent but started to have sudden onset of right leg weakness unable to ambulate.  Pt does have hx of A-fib but anticoagulation was stopped about a year ago.    -  Unable to obtain MRI due to PPM - Plegic on RUE  and RLE - likely embolic related - CTH no acute changes but still likely hood of embolic strokes.  - Agree with anticoagulation but would like to wait about 7 days due to suspected L sided subcortical ischemia and to prevent hemorrhagic conversion  - Will likely need SNIFF   Shulamit Donofrio  10/28/2017, 10:14 AM

## 2017-10-28 NOTE — Progress Notes (Signed)
[facility] - [place] at [facility]   PATIENT NAME: Joanna Roberts    MR#:  [id number]  DATE OF BIRTH:  February 03, 1929  Worsening weakness on right side and she also has difficulty swallowing today.  CHIEF COMPLAINT:   Chief Complaint  Patient presents with  . Extremity Weakness    REVIEW OF SYSTEMS:   ROS CONSTITUTIONAL: No fever, fatigue or weakness.  EYES: No blurred or double vision.  EARS, NOSE, AND THROAT: No tinnitus or ear pain.  RESPIRATORY: Mild shortness of breath today. CARDIOVASCULAR: No chest pain, orthopnea, edema.  GASTROINTESTINAL: No nausea, vomiting, diarrhea or abdominal pain.  GENITOURINARY: No dysuria, hematuria.  ENDOCRINE: No polyuria, nocturia,  HEMATOLOGY: No anemia, easy bruising or bleeding SKIN: No rash or lesion. MUSCULOSKELETAL: No joint pain or arthritis.   NEUROLOGIC: More worsening weakness on the right side, noted to have some cough when she swallows.  PSYCHIATRY: No anxiety or depression.   DRUG ALLERGIES:   Allergies  Allergen Reactions  . Lortab [Hydrocodone-Acetaminophen] Itching, Swelling and Rash  . Doxycycline Diarrhea  . Macrobid [Nitrofurantoin Monohyd Macro] Diarrhea and Nausea And Vomiting  . Nitrofurantoin Diarrhea, Nausea Only and Nausea And Vomiting    VITALS:  Blood pressure 106/82, pulse 80, temperature (!) 97.5 F (36.4 C), temperature source Oral, resp. rate 16, height 5\' 4"  (1.626 m), weight 59.5 kg (131 lb 3.2 oz), SpO2 99 %.  PHYSICAL EXAMINATION:  GENERAL:  84 y.o.-year-old patient lying in the bed with no acute distress.  EYES: Pupils equal, round, reactive to light and accommodation. No scleral icterus. Extraocular muscles intact.  HEENT: Head atraumatic, normocephalic. Oropharynx and nasopharynx clear.  NECK:  Supple, no jugular venous distention. No thyroid enlargement, no tenderness.  LUNGS: Normal breath sounds bilaterally, no wheezing, rales,rhonchi or crepitation. No use of accessory  muscles of respiration.  CARDIOVASCULAR: S1, S2 normal. No murmurs, rubs, or gallops.  ABDOMEN: Soft, nontender, nondistended. Bowel sounds present. No organomegaly or mass.  EXTREMITIES: No pedal edema, cyanosis, or clubbing.  NEUROLOGIC: Slightly slurred speech, cranial nerves intact from 2-12, patient power on the right upper, lower extremity 0/-5 with complete facility, power on the left upper and lower extremity 5/5.  PSYCHIATRIC: The patient is alert and oriented x 3.  SKIN: No obvious rash, lesion, or ulcer.    LABORATORY PANEL:   CBC Recent Labs  Lab 10/26/17 1007  WBC 3.7  HGB 10.7*  HCT 33.1*  PLT 125*   ------------------------------------------------------------------------------------------------------------------  Chemistries  Recent Labs  Lab 10/26/17 1007  NA 131*  K 4.3  CL 92*  CO2 33*  GLUCOSE 111*  BUN 26*  CREATININE 0.93  CALCIUM 9.2  AST 23  ALT 16  ALKPHOS 45  BILITOT 1.7*   ------------------------------------------------------------------------------------------------------------------  Cardiac Enzymes Recent Labs  Lab 10/26/17 1007  TROPONINI 0.03*   ------------------------------------------------------------------------------------------------------------------  RADIOLOGY:  Ct Head Wo Contrast  Result Date: 10/28/2017 CLINICAL DATA:  Slurred speech with weakness on left side, new. Persistent right-sided weakness EXAM: CT HEAD WITHOUT CONTRAST TECHNIQUE: Contiguous axial images were obtained from the base of the skull through the vertex without intravenous contrast. COMPARISON:  October 27, 2017 FINDINGS: Brain: There is mild to moderate diffuse atrophy. There is no intracranial mass, hemorrhage, extra-axial fluid collection, or midline shift. There is an acute appearing infarct arising in the medial superior left frontal lobe adjacent to the falx. This infarct appears to involve the superior most aspect of the white matter of the centrum  semiovale on the left.  There is a minimal area of extension to the right of the falx in this area seen on axial slice 24 series 2. No other acute appearing infarct is evident. There is patchy small vessel disease in the centra semiovale bilaterally. Vascular: No hyperdense vessel evident. There is calcification in each carotid siphon and distal vertebral artery. Skull: Bony calvarium appears intact. Sinuses/Orbits: There is mucosal thickening in several ethmoid air cells bilaterally. Other visualized paranasal sinuses are clear. Orbits appear symmetric bilaterally. There is evidence of cataract removals bilaterally. Other: Bony calvarium appears intact. There is debris in the left external auditory canal. IMPRESSION: Acute infarct in the anterior superior left frontal lobe with minimal extension to the right of the falx in the medial anterior right frontal lobe. This finding is best seen on axial slice 24 series 2. No other acute infarct evident. There is atrophy with patchy periventricular small vessel disease. No mass or hemorrhage evident. Foci of arterial vascular calcification noted. Mucosal thickening noted in several ethmoid air cells. These results were called by telephone at the time of interpretation on 10/28/2017 at 11:25 am to Dr. Katha Hamming , who verbally acknowledged these results. Electronically Signed   By: Bretta Bang III M.D.   On: 10/28/2017 11:25   Ct Head Wo Contrast  Result Date: 10/27/2017 CLINICAL DATA:  Persistent right-sided weakness EXAM: CT HEAD WITHOUT CONTRAST TECHNIQUE: Contiguous axial images were obtained from the base of the skull through the vertex without intravenous contrast. COMPARISON:  10/26/2017 FINDINGS: Brain: Chronic atrophic and ischemic changes without acute abnormality. No findings to suggest acute hemorrhage, acute infarction or space-occupying mass lesion are noted. Vascular: No hyperdense vessel or unexpected calcification. Skull: Normal. Negative for  fracture or focal lesion. Sinuses/Orbits: Stable mucosal thickening in the sphenoid sinus. Other: Previously seen venous air has resolved in the interval from the prior exam. IMPRESSION: Chronic atrophic and ischemic changes without acute abnormality. Electronically Signed   By: Alcide Clever M.D.   On: 10/27/2017 11:36   US Carotid Bilateral  Result Date: 10/27/2017 CLINICAL DATA:  84 year old female with a history of TIA. Cardiovascular risk factors include known stroke/TIA, known coronary artery disease EXAM: BILATERAL CAROTID DUPLEX ULTRASOUND TECHNIQUE: Wallace Cullens scale imaging, color Doppler and duplex ultrasound were performed of bilateral carotid and vertebral arteries in the neck. COMPARISON:  None. FINDINGS: Criteria: Quantification of carotid stenosis is based on velocity parameters that correlate the residual internal carotid diameter with NASCET-based stenosis levels, using the diameter of the distal internal carotid lumen as the denominator for stenosis measurement. The following velocity measurements were obtained: RIGHT ICA:  Systolic 37 cm/sec, Diastolic 6 cm/sec CCA:  37 cm/sec SYSTOLIC ICA/CCA RATIO:  1.0 ECA:  37 cm/sec LEFT ICA:  Systolic 106 cm/sec, Diastolic 38 cm/sec CCA:  50 cm/sec SYSTOLIC ICA/CCA RATIO:  2.1 ECA:  72 cm/sec Right Brachial SBP: Not acquired Left Brachial SBP: Not acquired RIGHT CAROTID ARTERY: No significant calcifications of the right common carotid artery. Intermediate waveform maintained. Heterogeneous and partially calcified plaque at the right carotid bifurcation. No significant lumen shadowing. Low resistance waveform of the right ICA, with parvus tardus configuration. No significant tortuosity. RIGHT VERTEBRAL ARTERY: Antegrade flow with low resistance waveform, with parvus tardus configuration. LEFT CAROTID ARTERY: No significant calcifications of the left common carotid artery. Intermediate waveform maintained. Heterogeneous and partially calcified plaque at the left  carotid bifurcation without significant lumen shadowing. Low resistance waveform of the left ICA, with parvus tardus configuration. No significant tortuosity. LEFT VERTEBRAL ARTERY: Antegrade  flow with low resistance waveform, with parvus tardus configuration. IMPRESSION: Color duplex indicates minimal heterogeneous and calcified plaque, with no hemodynamically significant stenosis by duplex criteria in the extracranial cerebrovascular circulation. The parvus tardus configuration throughout the cerebral vessels may reflect aortic stenosis. Correlation with ECHO recommended, if not already performed. Signed, Yvone Neu. Reyne Dumas, RPVI [facility] Electronically Signed   By: Gilmer Mor D.O.   On: 10/27/2017 08:05    EKG:   Orders placed or performed during the hospital encounter of 10/26/17  . ED EKG  . ED EKG  . EKG 12-Lead  . EKG 12-Lead  . EKG 12-Lead  . EKG 12-Lead    ASSESSMENT AND PLAN:  D 84 year old female with multiple medical problems of chronic systolic heart failure with EF 25 to 30%, chronic atrial fibrillation not on anticoagulation, GERD, hypertension, hypothyroidism, chronic respiratory failure with oxygen 2 L at baseline comes in with [facility] independent living facility because of said weakness of the started yesterday morning and patient is admitted for evaluation of TIA./CVA. 1.  Acute right-sided weakness likely due to embolic stroke patient ; since CT head today showed infarct in the left frontal lobe.  CT head yesterday, and admission did not show this.  unAble to have MRI brain secondary to pacemaker.,  she has embolic stroke due to her chronic atrial fibrillation: Appreciate neurology, cardiology evaluation, continue aspirin, PT, OT consult, physical therapy recommended skilled nursing facility, patient ultrasound of carotids did not show hemodynamically significant stenosis.  Seen by neurology, recommend to wait 7  days secondary to acute stroke to prevent hemorrhage, continue aspirin.    2.  Chronic systolic heart failure with diffuse hypokinesia, EF 25 to 30%: Followed by Dr. Lady Gary from cardiology so cardiology consult is requested, patient has history of severe MR, TR, followed by CHF clinic.  Resumed home dose diuretics, continue Coreg   3.  Chronic atrial fibrillation, status post ICD, PPM placement.   All the records are reviewed and case discussed with Care Management/Social Workerr. Management plans discussed with the patient, family and they are in agreement.  CODE STATUS: DNR  TOTAL TIME TAKING CARE OF THIS PATIENT: 40 minutes minutes.   POSSIBLE D/C IN 2-3 DAYS, DEPENDING ON CLINICAL CONDITION. More than 50% of the time, spent in counseling, coordination of care, discussing with patient, reviewing the old charts.  Katha Hamming M.D on 10/28/2017 at 1:13 PM  Between 7am to 6pm - Pager - [phone]  After 6pm go to [email or web address] - password [email or web address] [facility]  [place] [facility]  Office  [phone]  CC: Primary care physician; Patrice Paradise, MD   Note: This dictation was prepared with Dragon dictation along with smaller phrase technology. Any transcriptional errors that result from this process are unintentional.

## 2017-10-28 NOTE — Progress Notes (Signed)
Initial Nutrition Assessment  DOCUMENTATION CODES:   Not applicable  INTERVENTION:  Continue Ensure Enlive po daily following speech eval if appropriate, each supplement provides 350 kcal and 20 grams of protein  NUTRITION DIAGNOSIS:   Inadequate oral intake related to dysphagia(potential aspiration) as evidenced by (Per RN).  GOAL:   Patient will meet greater than or equal to 90% of their needs  MONITOR:   PO intake, Weight trends(diet modifications)  REASON FOR ASSESSMENT:   Consult Assessment of nutrition requirement/status  ASSESSMENT:   Patient with PMH HTN, GERD, presents with sudden onset of R Leg weakness. Suspected embolic stroke.  Spoke with patient at bedside. She reports a UBW of 148 pounds with a 20 pound weight loss since the fall. Per care everywhere patient was around 127 pounds in October and has fluctuated around that weight for several months. No evidence of weight loss at this time.   Reports decreased appetite recently. Normal intake consists of 2 boiled eggs and oatmeal or grits for breakfast, baked fish with a baked potato and vegetables for lunch and a similar meal for dinner. Recently has been drinking an ensure daily.   Ambulates with a walker mostly. Upon completion of NFPE, she exhibits some muscle wasting and fat depletions but given her entire clinical picture, does not seem malnourished to this RD.  Spoke with RN during visit, apparently patient may have aspirated and may need her diet downgraded. SLP to see. She was not eating lunch during this RD's visit. Voice sounded gurgley.   Will monitor for needs following SLP eval.  Labs reviewed:  Na 131, BUN 26, TBili 1.7  Medications reviewed and include:  Colace, Vit B12  NUTRITION - FOCUSED PHYSICAL EXAM:    Most Recent Value  Orbital Region  No depletion  Upper Arm Region  Severe depletion  Thoracic and Lumbar Region  No depletion  Buccal Region  Mild depletion  Temple Region  Mild  depletion  Clavicle Bone Region  No depletion  Clavicle and Acromion Bone Region  Mild depletion  Scapular Bone Region  Mild depletion  Dorsal Hand  Severe depletion  Patellar Region  No depletion  Anterior Thigh Region  No depletion  Posterior Calf Region  Moderate depletion       Diet Order:   Diet Order           Diet Heart Room service appropriate? Yes with Assist; Fluid consistency: Thin  Diet effective now          EDUCATION NEEDS:   No education needs have been identified at this time  Skin:  Skin Assessment: Skin Integrity Issues: Skin Integrity Issues:: Stage III Stage III: To Ankle  Last BM:  10/24/2017  Height:   Ht Readings from Last 1 Encounters:  10/26/17 5\' 4"  (1.626 m)    Weight:   Wt Readings from Last 1 Encounters:  10/26/17 131 lb 3.2 oz (59.5 kg)    Ideal Body Weight:  54.54 kg  BMI:  Body mass index is 22.52 kg/m.  Estimated Nutritional Needs:   Kcal:  1250-1500 calories  Protein:  59-66 grams (1-1.1g/kg)  Fluid:  >1.5L    12/27/17. Katalyn Matin, MS, RD LDN Inpatient Clinical Dietitian Pager [phone]

## 2017-10-28 NOTE — Evaluation (Signed)
Physical Therapy Evaluation Patient Details Name: Joanna Roberts MRN: [id number] DOB: [birth year] Today's Date: 10/28/2017   History of Present Illness  Joanna Roberts is an 84yo female who comes to [facility] on 7/3 after sudden insidious RLE paralysis. Head CT negative, but pt is being worked up for TIA v CVA at this time. Pt is pending doppler to r/o DVT hence exam is limited on this date. Since admission, pt has had progressive loss of RUE as well and nwo speech changes as of 7/5. PTA pt lived at [facility] AMB with rollator, independent in ADL.   Clinical Impression  Pt admitted with above diagnosis. Pt currently with functional limitations due to the deficits listed below (see "PT Problem List"). Upon entry, pt in bed, no family/caregiver present. The pt is awake and agreeable to participate. Pt with gurgly speech upon entry, milk carton in hand, but she denies any difficulty swallowing: she reports the gurgling to be related to phlegm production with poor expectoration. Pt encouraged to cough, which she demonstrates as very weak and unproductive. RUE demonstrates complete flaccidity at this time with 1+/4 Ashworth tone in triceps and 1/4 tone in flexors. RLE also completely flaccid at this time, sensation intact but abnormal. Pt demonstrating difficulty in trunk support and head support, although largely anticipated to be chronic cervical postural changes. No OOB mobility attempted as patient is not saturating well at this time in spit eof increased flow rate after entry (RN notified), and above described decline (MD made aware.) Pt will benefit from skilled PT intervention to increase independence and safety with basic mobility in preparation for discharge to the venue listed below.       Follow Up Recommendations SNF;Supervision/Assistance - 24 hour    Equipment Recommendations  Other (comment)(to be determined by receiveing facility. )    Recommendations for Other Services       Precautions /  Restrictions Precautions Precautions: Fall Restrictions Weight Bearing Restrictions: No      Mobility  Bed Mobility Overal bed mobility: (not attempted this date due to high level trunk/limb weakness and awaiting imaging to R/O DVT )                Transfers                    Ambulation/Gait                Stairs            Wheelchair Mobility    Modified Rankin (Stroke Patients Only)       Balance                                             Pertinent Vitals/Pain Pain Assessment: No/denies pain    Home Living Family/patient expects to be discharged to:: Private residence([facility]) Living Arrangements: Alone Available Help at Discharge: Available 24 hours/day(staff) Type of Home: Independent living facility Home Access: Level entry     Home Layout: One level Home Equipment: Walker - 2 wheels;Cane - single point;Walker - 4 wheels      Prior Function Level of Independence: Independent with assistive device(s)         Comments: Pt amb w/ rollator at all times, modified indep with ADL requiring additional time/effort to perform with increasing difficulty, per pt report. Endorses 1 fall in November ("  slid to the floor") where she injured her R shoulder and hasn't been able to use normally since.      Hand Dominance   Dominant Hand: Right    Extremity/Trunk Assessment   Upper Extremity Assessment Upper Extremity Assessment: RUE deficits/detail RUE Deficits / Details: flaccid this date, strong 1+/4 tone in elbow extensors and 1/4 tone in elbow flexors.   RUE Sensation: WNL    Lower Extremity Assessment Lower Extremity Assessment: RLE deficits/detail RLE Deficits / Details: sensation abnormal but present, no completely flaccid.     Cervical / Trunk Assessment Cervical / Trunk Assessment: (difficulty with head/trunk control while upright in bed; pt encouraged to cough, but cough is very weak not  productive at clearning gurgles with speech)  Communication   Communication: No difficulties  Cognition Arousal/Alertness: Awake/alert Behavior During Therapy: WFL for tasks assessed/performed                                   General Comments: A&Ox4, follows all commands, pleasant      General Comments      Exercises     Assessment/Plan    PT Assessment Patient needs continued PT services  PT Problem List Decreased strength;Decreased range of motion;Decreased coordination;Decreased mobility;Decreased balance;Decreased safety awareness;Decreased knowledge of use of DME;Decreased knowledge of precautions;Impaired sensation;Impaired tone       PT Treatment Interventions Functional mobility training;DME instruction;Gait training;Therapeutic exercise;Therapeutic activities;Balance training;Neuromuscular re-education;Cognitive remediation;Patient/family education    PT Goals (Current goals can be found in the Care Plan section)  Acute Rehab PT Goals Patient Stated Goal: to get better and return home PT Goal Formulation: With patient Time For Goal Achievement: 10/26/2017 Potential to Achieve Goals: Fair    Frequency 7X/week   Barriers to discharge Inaccessible home environment;Decreased caregiver support      Co-evaluation               AM-PAC PT "6 Clicks" Daily Activity  Outcome Measure Difficulty turning over in bed (including adjusting bedclothes, sheets and blankets)?: Unable Difficulty moving from lying on back to sitting on the side of the bed? : Unable Difficulty sitting down on and standing up from a chair with arms (e.g., wheelchair, bedside commode, etc,.)?: Unable Help needed moving to and from a bed to chair (including a wheelchair)?: Total Help needed walking in hospital room?: Total Help needed climbing 3-5 steps with a railing? : Total 6 Click Score: 6    End of Session   Activity Tolerance: Patient limited by lethargy;Treatment limited  secondary to medical complications (Comment)(pts coondiiton has worsened since OT eval on 7/3. O2 sats remain in 80s during session. ) Patient left: in bed;with SCD's reapplied;with nursing/sitter in room;with call bell/phone within reach(RUE supported on pillow, heels floated, SCDS plugged in and turned on. ) Nurse Communication: Mobility status(concerns over low SpO2, and decline in RUE function) PT Visit Diagnosis: Muscle weakness (generalized) (M62.81);Difficulty in walking, not elsewhere classified (R26.2);Hemiplegia and hemiparesis Hemiplegia - Right/Left: Right Hemiplegia - dominant/non-dominant: Dominant Hemiplegia - caused by: Unspecified    Time: 0940-1000 PT Time Calculation (min) (ACUTE ONLY): 20 min   Charges:   PT Evaluation $PT Eval Moderate Complexity: 1 Mod     PT G Codes:        10:32 AM, 11/18/2017 [name], PT, DPT Physical Therapist - [facility]  [phone] (ASCOM)     Buccola,Allan C 11-18-17, 10:25 AM

## 2017-10-29 MED ORDER — SCOPOLAMINE 1 MG/3DAYS TD PT72
1.0000 | MEDICATED_PATCH | TRANSDERMAL | Status: DC
  Administered 2017-10-29 – 2017-11-01 (×2): 1.5 mg via TRANSDERMAL
  Filled 2017-10-29 (×3): qty 1

## 2017-10-29 MED ORDER — ALPRAZOLAM 0.5 MG PO TABS: 0.5000 mg | ORAL_TABLET | Freq: Two times a day (BID) | ORAL | Status: DC | PRN

## 2017-10-29 MED ORDER — BISACODYL 10 MG RE SUPP
10.0000 mg | Freq: Once | RECTAL | Status: AC
  Administered 2017-10-29: 16:00:00 10 mg via RECTAL
  Filled 2017-10-29: qty 1

## 2017-10-29 MED ORDER — SODIUM CHLORIDE 0.9 % IV SOLN
INTRAVENOUS | Status: DC
  Administered 2017-10-29: 17:00:00 via INTRAVENOUS

## 2017-10-29 NOTE — Progress Notes (Addendum)
Occupational Therapy Treatment Patient Details Name: Joanna Roberts MRN: [id number] DOB: 1928-06-08 Today's Date: 10/29/2017    History of present illness Joanna Roberts is an 84yo female who comes to [facility] on 7/3 after sudden insidious RLE paralysis. Head CT negative, but pt is being worked up for TIA v CVA at this time. Pt is pending doppler to r/o DVT hence exam is limited on this date. Since admission, pt has had progressive loss of RUE as well and nwo speech changes as of 7/5. PTA pt lived at [facility] with rollator, independent in ADL.    OT comments  Met pt lying supine in bed, being fed by friend this date. Pt presents with gurgly voice after drinking, encouraged to cough- pt cough non productive. Informed RN of concerns. Pt with sats at 78-81 at baseline with no movement, OOB activity and functional t/f deferred this date. Pt on 3L [place] throughout entirety of session. Functional PNF patterns completed to RUE in D1 and D2 flexion and extension x10, encouraged pt to incorporate visual aspect at arm during exercise. Pt with tendency to R lateral lean while in bed during exercise, corrected with VC's to pull self with LUE to straight position. Mild tone noted R elbow with PNF patterns. Instructed pt in completed SROM using LUE, needing min A and support at shoulder/elbow to complete exercise. Completed SROM in shoulder flexion x5.  Repositioned with pillows to R side to prevent lateral lean and to further support RUE. Sats 91% for short period after bed level neuro re-ed exercise, remaining at about 89% O2 at rest.  Follow Up Recommendations  SNF    Equipment Recommendations  Other (comment)(TBD)    Recommendations for Other Services      Precautions / Restrictions Precautions Precautions: Fall Restrictions Weight Bearing Restrictions: No       Mobility Bed Mobility Overal bed mobility: Needs Assistance Bed Mobility: Rolling Rolling: Max assist            Transfers                       Balance                                           ADL either performed or assessed with clinical judgement   ADL Overall ADL's : Needs assistance/impaired Eating/Feeding: Bed level;Set up Eating/Feeding Details (indicate cue type and reason): using non-dom hand primarily (since Nov), pt friend feeding pt upon entry Grooming: Bed level;Set up   Upper Body Bathing: Bed level;Maximal assistance   Lower Body Bathing: Bed level;Maximal assistance   Upper Body Dressing : Bed level;Maximal assistance   Lower Body Dressing: Bed level;Maximal assistance     Toilet Transfer Details (indicate cue type and reason): unsafe to attempt this date       Tub/Shower Transfer Details (indicate cue type and reason): unsafe to attempt this date         Vision Baseline Vision/History: Wears glasses Wears Glasses: At all times Patient Visual Report: No change from baseline Vision Assessment?: No apparent visual deficits   Perception     Praxis      Cognition Arousal/Alertness: Awake/alert Behavior During Therapy: WFL for tasks assessed/performed Overall Cognitive Status: Within Functional Limits for tasks assessed  General Comments: A&Ox4, follows all commands, pleasant        Exercises     Shoulder Instructions       General Comments      Pertinent Vitals/ Pain       Pain Assessment: No/denies pain Faces Pain Scale: No hurt  Home Living                                          Prior Functioning/Environment              Frequency  Min 2X/week        Progress Toward Goals  OT Goals(current goals can now be found in the care plan section)  Progress towards OT goals: Progressing toward goals  Acute Rehab OT Goals Patient Stated Goal: to return to PLOF OT Goal Formulation: With patient Time For Goal Achievement: 11/09/17 Potential to Achieve Goals: [place] Discharge plan remains appropriate;Frequency remains appropriate    Co-evaluation                 AM-PAC PT "6 Clicks" Daily Activity     Outcome Measure   Help from another person eating meals?: A Little Help from another person taking care of personal grooming?: A Little Help from another person toileting, which includes using toliet, bedpan, or urinal?: A Lot Help from another person bathing (including washing, rinsing, drying)?: A Lot Help from another person to put on and taking off regular upper body clothing?: A Lot Help from another person to put on and taking off regular lower body clothing?: A Lot 6 Click Score: 14    End of Session Equipment Utilized During Treatment: Oxygen  OT Visit Diagnosis: Other abnormalities of gait and mobility (R26.89);History of falling (Z91.81);Muscle weakness (generalized) (M62.81);Hemiplegia and hemiparesis Hemiplegia - Right/Left: Right Hemiplegia - dominant/non-dominant: Dominant Hemiplegia - caused by: Cerebral infarction   Activity Tolerance Treatment limited secondary to medical complications (Comment);Other (comment)(low O2 sats)   Patient Left in bed;with call bell/phone within reach;with bed alarm set;with family/visitor present   Nurse Communication Other (comment)(regarding concerns of gurgly voice)        Time: 1315-1330 OT Time Calculation (min): 15 min  Charges: OT General Charges $OT Visit: 1 Visit OT Treatments $Therapeutic Exercise: 8-22 mins  Zenovia Jarred, [place], OTR/L [phone]   Zenovia Jarred 10/29/2017, 2:33 PM

## 2017-10-29 NOTE — Progress Notes (Signed)
Speech Language Pathology Treatment: Dysphagia  Patient Details Name: Joanna Roberts MRN: [id number] DOB: June 23, 1928 Today's Date: 10/29/2017 Time: 7673-4193 SLP Time Calculation (min) (ACUTE ONLY): 38 min  Assessment / Plan / Recommendation Clinical Impression  Pt seen for ongoing toleration of diet; safety w/ oral intake in light of progressing CVA since admission. Pt has also met w/ the Cardiologist(EF of 25-30% baseline) and stated she feels "congested". Cannot take her anticoagulation med (Neurologist) but Lasix ordered per Cardiologist. Pt continues to be weak d/t illness but is talkative and can follow through w/ tasks given verbal cues. Pt consumed a few po trials of Nectar consistency liquids via Cup w/ no overt s/s of aspiration noted; clear vocal quality noted b/t trials. Pt is weak and requires support w/ feeding of foods d/t RUE weakness.  Due to overall concern for pt's status and safety w/ oral intake, ST services will modify the po diet at this time. Education on aspiration precautions given/posted; education given on food consistencies and preparation; modifying of liquids to lessen risk for aspiration at this time in light of progression of her stroke and her declined Pulmonary status d/t CHF. ST services will continue to f/u w/ pt's status while admitted and modify diet accordingly. Recommend FULL Feeding assistance during meals; Dietician f/u for support; Palliative Care consult for goals of care, education. NSG updated.     HPI HPI: Pt is a 84 y.o. female with a history of anemia, arthritis, CHF (EF 25-30%), GERD, hypertension, hypothyroidism, MI, on 2L O2 at baseline, who presents to the ED for right leg weakness that started this am and was unable to walk.  Patient arrives from [place].  No facial droop or speech issues.  She was last known normal when she went to bed that night.  She denies any recent illness or other complaints.  Patient lives at independent facility and  she is very independent but started to have sudden onset of right leg weakness unable to ambulate.  Pt also c/o being unable to use her R arm this morning at this evaluation.  OT/PT following.  Pt is on a mech soft diet since admission ~2 days ago. Pt has c/o intermittent coughing when drinking liquids at home but did not report an increase if frequency since admission. NSG reported increased R sided weakness during this admission and concern for dysphagia. Pt has a history of congestive heart failure and atrial fibrillation on no anticoagulants currently d/t stroke; continue w/ Lasix per Cardiologist.       SLP Plan  Continue with current plan of care       Recommendations  Diet recommendations: Dysphagia 2 (fine chop);Nectar-thick liquid(to be more conservative during this time of illness:CVA, CHF) Liquids provided via: Cup;No straw Medication Administration: Whole meds with puree(Crush as needed for safer swallowing) Supervision: Patient able to self feed;Full supervision/cueing for compensatory strategies;Trained caregiver to feed patient(d/t RUE weakness; pt can hold cup to drink) Compensations: Minimize environmental distractions;Slow rate;Small sips/bites;Lingual sweep for clearance of pocketing;Follow solids with liquid Postural Changes and/or Swallow Maneuvers: Seated upright 90 degrees;Upright 30-60 min after meal                General recommendations: (Dietician f/u; Palliative Care f/u) Oral Care Recommendations: Oral care BID;Staff/trained caregiver to provide oral care Follow up Recommendations: (TBD) SLP Visit Diagnosis: Dysphagia, oropharyngeal phase (R13.12)(in light of progressin stroke) Plan: Continue with current plan of care       GO  Orinda Kenner, MS, CCC-SLP Kyliyah Stirn 10/29/2017, 12:02 PM

## 2017-10-29 NOTE — Progress Notes (Addendum)
[facility] [place] at [facility]   PATIENT NAME: Joanna Roberts    MR#:  [id number]  DATE OF BIRTH:  12/14/1928  Patient completely flaccid on the right side and also have some garbled speech.  CHIEF COMPLAINT:   Chief Complaint  Patient presents with  . Extremity Weakness    REVIEW OF SYSTEMS:   ROS CONSTITUTIONAL: No fever, fatigue   Does have dysarthria, right-sided weakness EYES: No blurred or double vision.  EARS, NOSE, AND THROAT: No tinnitus or ear pain.  RESPIRATORY: No shortness of breath today cARDIOVASCULAR: No chest pain, orthopnea, edema.  GASTROINTESTINAL: No nausea, vomiting, diarrhea or abdominal pain.  GENITOURINARY: No dysuria, hematuria.  ENDOCRINE: No polyuria, nocturia,  HEMATOLOGY: No anemia, easy bruising or bleeding SKIN: No rash or lesion. MUSCULOSKELETAL: No joint pain or arthritis.   NEUROLOGIC: Right-sided weakness, garbled speech  PSYCHIATRY: No anxiety or depression.   DRUG ALLERGIES:   Allergies  Allergen Reactions  . Lortab [Hydrocodone-Acetaminophen] Itching, Swelling and Rash  . Doxycycline Diarrhea  . Macrobid [Nitrofurantoin Monohyd Macro] Diarrhea and Nausea And Vomiting  . Nitrofurantoin Diarrhea, Nausea Only and Nausea And Vomiting    VITALS:  Blood pressure 125/77, pulse 77, temperature 97.7 F (36.5 C), temperature source Oral, resp. rate 16, height 5\' 4"  (1.626 m), weight 59.5 kg (131 lb 3.2 oz), SpO2 98 %.  PHYSICAL EXAMINATION:  GENERAL:  84 y.o.-year-old patient lying in the bed with no acute distress.  EYES: Pupils equal, round, reactive to light and accommodation. No scleral icterus. Extraocular muscles intact.  HEENT: Head atraumatic, normocephalic. Oropharynx and nasopharynx clear.  NECK:  Supple, no jugular venous distention. No thyroid enlargement, no tenderness.  LUNGS: Normal breath sounds bilaterally, no wheezing, rales,rhonchi or crepitation. No use of accessory muscles of respiration.   CARDIOVASCULAR: S1, S2 normal. No murmurs, rubs, or gallops.  ABDOMEN: Soft, nontender, nondistended. Bowel sounds present. No organomegaly or mass.  EXTREMITIES: No pedal edema, cyanosis, or clubbing.  NEUROLOGIC: Dysarthria, cranial nerves II through XII intact, right upper and lower extremity power 0 out of 5, left upper, lower extremity 5/5 Patient also complains of numbness on the right side.  PSYCHIATRIC: The patient is alert and oriented x 3.  SKIN: No obvious rash, lesion, or ulcer.    LABORATORY PANEL:   CBC Recent Labs  Lab 10/26/17 1007  WBC 3.7  HGB 10.7*  HCT 33.1*  PLT 125*   ------------------------------------------------------------------------------------------------------------------  Chemistries  Recent Labs  Lab 10/26/17 1007  NA 131*  K 4.3  CL 92*  CO2 33*  GLUCOSE 111*  BUN 26*  CREATININE 0.93  CALCIUM 9.2  AST 23  ALT 16  ALKPHOS 45  BILITOT 1.7*   ------------------------------------------------------------------------------------------------------------------  Cardiac Enzymes Recent Labs  Lab 10/26/17 1007  TROPONINI 0.03*   ------------------------------------------------------------------------------------------------------------------  RADIOLOGY:  Ct Head Wo Contrast  Result Date: 10/28/2017 CLINICAL DATA:  Slurred speech with weakness on left side, new. Persistent right-sided weakness EXAM: CT HEAD WITHOUT CONTRAST TECHNIQUE: Contiguous axial images were obtained from the base of the skull through the vertex without intravenous contrast. COMPARISON:  October 27, 2017 FINDINGS: Brain: There is mild to moderate diffuse atrophy. There is no intracranial mass, hemorrhage, extra-axial fluid collection, or midline shift. There is an acute appearing infarct arising in the medial superior left frontal lobe adjacent to the falx. This infarct appears to involve the superior most aspect of the white matter of the centrum semiovale on the left.  There is a minimal area  of extension to the right of the falx in this area seen on axial slice 24 series 2. No other acute appearing infarct is evident. There is patchy small vessel disease in the centra semiovale bilaterally. Vascular: No hyperdense vessel evident. There is calcification in each carotid siphon and distal vertebral artery. Skull: Bony calvarium appears intact. Sinuses/Orbits: There is mucosal thickening in several ethmoid air cells bilaterally. Other visualized paranasal sinuses are clear. Orbits appear symmetric bilaterally. There is evidence of cataract removals bilaterally. Other: Bony calvarium appears intact. There is debris in the left external auditory canal. IMPRESSION: Acute infarct in the anterior superior left frontal lobe with minimal extension to the right of the falx in the medial anterior right frontal lobe. This finding is best seen on axial slice 24 series 2. No other acute infarct evident. There is atrophy with patchy periventricular small vessel disease. No mass or hemorrhage evident. Foci of arterial vascular calcification noted. Mucosal thickening noted in several ethmoid air cells. These results were called by telephone at the time of interpretation on 10/28/2017 at 11:25 am to Dr. Katha Hamming , who verbally acknowledged these results. Electronically Signed   By: Bretta Bang III M.D.   On: 10/28/2017 11:25   US Venous Img Lower Bilateral  Result Date: 10/28/2017 CLINICAL DATA:  84 year old female with bilateral lower extremity leg swelling and skin discoloration EXAM: BILATERAL LOWER EXTREMITY VENOUS DOPPLER ULTRASOUND TECHNIQUE: Gray-scale sonography with graded compression, as well as color Doppler and duplex ultrasound were performed to evaluate the lower extremity deep venous systems from the level of the common femoral vein and including the common femoral, femoral, profunda femoral, popliteal and calf veins including the posterior tibial, peroneal and  gastrocnemius veins when visible. The superficial great saphenous vein was also interrogated. Spectral Doppler was utilized to evaluate flow at rest and with distal augmentation maneuvers in the common femoral, femoral and popliteal veins. COMPARISON:  None. FINDINGS: RIGHT LOWER EXTREMITY Common Femoral Vein: No evidence of thrombus. Normal compressibility, respiratory phasicity and response to augmentation. Increased pulsatility of the venous waveforms. Saphenofemoral Junction: No evidence of thrombus. Normal compressibility and flow on color Doppler imaging. Profunda Femoral Vein: No evidence of thrombus. Normal compressibility and flow on color Doppler imaging. Femoral Vein: No evidence of thrombus. Normal compressibility, respiratory phasicity and response to augmentation. Popliteal Vein: No evidence of thrombus. Normal compressibility, respiratory phasicity and response to augmentation. Calf Veins: No evidence of thrombus. Normal compressibility and flow on color Doppler imaging. Superficial Great Saphenous Vein: No evidence of thrombus. Normal compressibility. Venous Reflux:  None. Other Findings:  None. LEFT LOWER EXTREMITY Common Femoral Vein: No evidence of thrombus. Normal compressibility, respiratory phasicity and response to augmentation. Increased pulsatility of the venous waveforms. Saphenofemoral Junction: No evidence of thrombus. Normal compressibility and flow on color Doppler imaging. Profunda Femoral Vein: No evidence of thrombus. Normal compressibility and flow on color Doppler imaging. Femoral Vein: No evidence of thrombus. Normal compressibility, respiratory phasicity and response to augmentation. Popliteal Vein: No evidence of thrombus. Normal compressibility, respiratory phasicity and response to augmentation. Calf Veins: No evidence of thrombus. Normal compressibility and flow on color Doppler imaging. Superficial Great Saphenous Vein: No evidence of thrombus. Normal compressibility. Venous  Reflux:  None. Other Findings:  None. IMPRESSION: 1. No evidence of deep venous thrombosis in either lower extremity. 2. Increased pulsatility of the venous waveforms bilaterally consistent with elevated right heart pressures. Differential considerations include tricuspid regurgitation, right heart failure, pulmonary hypertension and long-standing COPD. Electronically Signed  By: Malachy Moan M.D.   On: 10/28/2017 15:55    EKG:   Orders placed or performed during the hospital encounter of 10/26/17  . ED EKG  . ED EKG  . EKG 12-Lead  . EKG 12-Lead  . EKG 12-Lead  . EKG 12-Lead    ASSESSMENT AND PLAN:  D 84 year old female with multiple medical problems of chronic systolic heart failure with EF 25 to 30%, chronic atrial fibrillation not on anticoagulation, GERD, hypertension, hypothyroidism, chronic respiratory failure with oxygen 2 L at baseline comes in with [facility] independent living facility because of said weakness of the started yesterday morning and patient is admitted for evaluation of TIA./CVA. 1.  Acute right-sided weakness likely due to embolic stroke patient ; since CT head today showed infarct in the left frontal lobe.  CT head yesterday, and admission did not show this.  unAble to have MRI brain secondary to pacemaker.,  she has embolic stroke due to her chronic atrial fibrillation: Appreciate neurology, cardiology evaluation, continue aspirin, PT, OT consult, physical therapy recommended skilled nursing facility, patient ultrasound of carotids did not show hemodynamically significant stenosis.  Seen by neurology, recommend to wait 7 days secondary to acute stroke to prevent hemorrhage, continue aspirin. Poor prognosis, patient DNR, DNI.  L cell palliative care.  Reconsult speech therapy because patient likely is aspirating secondary to her stroke.  2.  Chronic systolic heart failure with diffuse hypokinesia, EF 25 to 30%: Followed by Dr. Lady Gary from cardiology so cardiology  consult is requested, patient has history of severe MR, TR, followed by CHF clinic.  Resumed home dose diuretics, continue Coreg   3.  Chronic atrial fibrillation, status post ICD, PPM placement.  Needs to wait for at least 7 to 10 days to start full dose anticoagulation as per neurology recommendation due to acute stroke  #4. right leg swelling, I do not see any right leg swelling, but registered nurse concerned about that so we did ultrasound of right leg did not show any DVT.  All the records are reviewed and case discussed with Care Management/Social Workerr. Management plans discussed with the patient, family and they are in agreement.  CODE STATUS: DNR  TOTAL TIME TAKING CARE OF THIS PATIENT: 40 minutes   POSSIBLE D/C IN 2-3 DAYS, DEPENDING ON CLINICAL CONDITION. More than 50% of the time, spent in counseling, coordination of care, discussing with patient, reviewing the old charts.  Katha Hamming M.D on 10/29/2017 at 11:35 AM  Between 7am to 6pm - Pager - [phone]  After 6pm go to [email or web address] - password [email or web address] [facility]  [place] [facility]  Office  [phone]  CC: Primary care physician; Patrice Paradise, MD   Note: This dictation was prepared with Dragon dictation along with smaller phrase technology. Any transcriptional errors that result from this process are unintentional.

## 2017-10-29 NOTE — Progress Notes (Signed)
SLP Cancellation Note  Patient Details Name: Joanna Roberts MRN: [id number] DOB: 07-28-1928   Cancelled treatment:       Reason Eval/Treat Not Completed: Patient not medically ready(NSG consulted stating pt's respiratory status is declined). Pt attempted to eat at her lunch meal (including thin liquids per report) and exhibited a gurgly vocal quality and congestion after the meal. Due to concern for dysphagia w/ worsening CVA presentation, recommend pt be NPO w/ objective swallow study on Monday. Provided oral swabs for oral care. NSG updated; MD was informed. ST services will f/u on Monday.     Jerilynn Som, MS, CCC-SLP Joanna Roberts 10/29/2017, 2:02 PM

## 2017-10-29 NOTE — Progress Notes (Signed)
SUBJECTIVE: Patient feels congested  Vitals:   10/28/17 1800 10/28/17 2041 10/29/17 0352 10/29/17 0758  BP: 129/85 (!) 123/94 112/86 125/77  Pulse: 84 79 80 77  Resp:  16 18 16   Temp:  97.8 F (36.6 C) 98.5 F (36.9 C) 97.7 F (36.5 C)  TempSrc:  Oral Oral Oral  SpO2:  96% 94% 98%  Weight:      Height:        Intake/Output Summary (Last 24 hours) at 10/29/2017 1112 Last data filed at 10/29/2017 0953 Gross per 24 hour  Intake 360 ml  Output 800 ml  Net -440 ml    LABS: Basic Metabolic Panel: No results for input(s): NA, K, CL, CO2, GLUCOSE, BUN, CREATININE, CALCIUM, MG, PHOS in the last 72 hours. Liver Function Tests: No results for input(s): AST, ALT, ALKPHOS, BILITOT, PROT, ALBUMIN in the last 72 hours. No results for input(s): LIPASE, AMYLASE in the last 72 hours. CBC: No results for input(s): WBC, NEUTROABS, HGB, HCT, MCV, PLT in the last 72 hours. Cardiac Enzymes: No results for input(s): CKTOTAL, CKMB, CKMBINDEX, TROPONINI in the last 72 hours. BNP: Invalid input(s): POCBNP D-Dimer: No results for input(s): DDIMER in the last 72 hours. Hemoglobin A1C: Recent Labs    10/27/17 0404  HGBA1C 5.5   Fasting Lipid Panel: Recent Labs    10/27/17 0404  CHOL 117  HDL 39*  LDLCALC 68  TRIG 51  CHOLHDL 3.0   Thyroid Function Tests: No results for input(s): TSH, T4TOTAL, T3FREE, THYROIDAB in the last 72 hours.  Invalid input(s): FREET3 Anemia Panel: No results for input(s): VITAMINB12, FOLATE, FERRITIN, TIBC, IRON, RETICCTPCT in the last 72 hours.   PHYSICAL EXAM General: Well developed, well nourished, in no acute distress HEENT:  Normocephalic and atramatic Neck:  No JVD.  Lungs: Clear bilaterally to auscultation and percussion. Heart: HRRR . Normal S1 and S2 without gallops or murmurs.  Abdomen: Bowel sounds are positive, abdomen soft and non-tender  Msk:  Back normal, normal gait. Normal strength and tone for age. Extremities: No clubbing, cyanosis or  edema.   Neuro: Alert and oriented X 3. Psych:  Good affect, responds appropriately  TELEMETRY: Not on monitor  ASSESSMENT AND PLAN: Right-sided weakness with history of congestive heart failure and atrial fibrillation on no anticoagulants. Neurology saw the patient and recommended no anticoagulation for 7-10 days. Continue Lasix and carvedilol.  Active Problems:   Left leg weakness   Pressure injury of skin    Oliver Heitzenrater A, MD, [facility] 10/29/2017 11:12 AM

## 2017-10-29 NOTE — Plan of Care (Signed)
  Problem: Education: Goal: Knowledge of General Education information will improve 10/29/2017 0903 by Donnel Saxon, RN Outcome: Progressing 10/28/2017 1926 by Donnel Saxon, RN Outcome: Progressing   Problem: Health Behavior/Discharge Planning: Goal: Ability to manage health-related needs will improve 10/29/2017 0903 by Donnel Saxon, RN Outcome: Progressing 10/28/2017 1926 by Donnel Saxon, RN Outcome: Progressing   Problem: Clinical Measurements: Goal: Ability to maintain clinical measurements within normal limits will improve 10/29/2017 0903 by Donnel Saxon, RN Outcome: Progressing 10/28/2017 1926 by Donnel Saxon, RN Outcome: Progressing Goal: Will remain free from infection 10/29/2017 0903 by Donnel Saxon, RN Outcome: Progressing 10/28/2017 1926 by Donnel Saxon, RN Outcome: Progressing Goal: Diagnostic test results will improve 10/29/2017 0903 by Donnel Saxon, RN Outcome: Progressing 10/28/2017 1926 by Donnel Saxon, RN Outcome: Progressing Goal: Respiratory complications will improve 10/29/2017 0903 by Donnel Saxon, RN Outcome: Progressing 10/28/2017 1926 by Donnel Saxon, RN Outcome: Progressing Goal: Cardiovascular complication will be avoided 10/29/2017 0903 by Donnel Saxon, RN Outcome: Progressing 10/28/2017 1926 by Donnel Saxon, RN Outcome: Progressing   Problem: Activity: Goal: Risk for activity intolerance will decrease 10/29/2017 0903 by Donnel Saxon, RN Outcome: Progressing 10/28/2017 1926 by Donnel Saxon, RN Outcome: Progressing   Problem: Nutrition: Goal: Adequate nutrition will be maintained 10/29/2017 0903 by Donnel Saxon, RN Outcome: Progressing 10/28/2017 1926 by Donnel Saxon, RN Outcome: Progressing   Problem: Coping: Goal: Level of anxiety will decrease 10/29/2017 0903 by Donnel Saxon, RN Outcome: Progressing 10/28/2017 1926 by  Donnel Saxon, RN Outcome: Progressing   Problem: Elimination: Goal: Will not experience complications related to bowel motility 10/29/2017 0903 by Donnel Saxon, RN Outcome: Progressing 10/28/2017 1926 by Donnel Saxon, RN Outcome: Progressing Goal: Will not experience complications related to urinary retention 10/29/2017 0903 by Donnel Saxon, RN Outcome: Progressing 10/28/2017 1926 by Donnel Saxon, RN Outcome: Progressing   Problem: Pain Managment: Goal: General experience of comfort will improve 10/29/2017 0903 by Donnel Saxon, RN Outcome: Progressing 10/28/2017 1926 by Donnel Saxon, RN Outcome: Progressing   Problem: Safety: Goal: Ability to remain free from injury will improve 10/29/2017 0903 by Donnel Saxon, RN Outcome: Progressing 10/28/2017 1926 by Donnel Saxon, RN Outcome: Progressing   Problem: Skin Integrity: Goal: Risk for impaired skin integrity will decrease 10/29/2017 0903 by Donnel Saxon, RN Outcome: Progressing 10/28/2017 1926 by Donnel Saxon, RN Outcome: Progressing

## 2017-10-29 NOTE — Progress Notes (Signed)
Received verbal order of scopolamine patch from Dr. Luberta Mutter for increased secretions.

## 2017-10-29 NOTE — Progress Notes (Signed)
R sided weakness   Past Medical History:  Diagnosis Date  . AICD (automatic cardioverter/defibrillator) present   . Allergy   . Anemia   . Arthritis   . Cataract   . CHF (congestive heart failure) (HCC)   . Depression   . Dysrhythmia   . GERD (gastroesophageal reflux disease)   . Heart murmur   . History of TIAs   . Hypertension   . Hypothyroidism   . Myocardial infarction (HCC)   . Osteoporosis   . Presence of permanent cardiac pacemaker   . Thyroid disease     Past Surgical History:  Procedure Laterality Date  . ABDOMINAL HYSTERECTOMY    . APPENDECTOMY    . HERNIA REPAIR    . IMPLANTABLE CARDIOVERTER DEFIBRILLATOR (ICD) GENERATOR CHANGE Left 07/27/2017   Procedure: ICD GENERATOR CHANGE;  Surgeon: Marcina Millard, MD;  Location: [facility] ORS;  Service: Cardiovascular;  Laterality: Left;  . INSERT / REPLACE / REMOVE PACEMAKER    . LOWER EXTREMITY ANGIOGRAPHY Left 02/08/2017   Procedure: Lower Extremity Angiography;  Surgeon: Renford Dills, MD;  Location: [facility]IVE [facility];  Service: Cardiovascular;  Laterality: Left;  . PERIPHERAL VASCULAR CATHETERIZATION Right 10/08/2014   Procedure: Lower Extremity Angiography;  Surgeon: Renford Dills, MD;  Location: [facility];  Service: Cardiovascular;  Laterality: Right;  . PERIPHERAL VASCULAR CATHETERIZATION  10/08/2014   Procedure: Lower Extremity Intervention;  Surgeon: Renford Dills, MD;  Location: [facility];  Service: Cardiovascular;;  . PERIPHERAL VASCULAR CATHETERIZATION N/A 03/23/2016   Procedure: Visceral Angiography;  Surgeon: Renford Dills, MD;  Location: [facility];  Service: Cardiovascular;  Laterality: N/A;  . pessary      Family History  Problem Relation Age of Onset  . Early death Mother   . Heart disease Mother   . Arthritis Father   . Asthma Father   . Stroke Sister     Social History:  reports that she has never smoked. She has never used smokeless tobacco. She  reports that she does not drink alcohol or use drugs.  Allergies  Allergen Reactions  . Lortab [Hydrocodone-Acetaminophen] Itching, Swelling and Rash  . Doxycycline Diarrhea  . Macrobid [Nitrofurantoin Monohyd Macro] Diarrhea and Nausea And Vomiting  . Nitrofurantoin Diarrhea, Nausea Only and Nausea And Vomiting     Physical Examination: Blood pressure 125/77, pulse 77, temperature 97.7 F (36.5 C), temperature source Oral, resp. rate 16, height 5\' 4"  (1.626 m), weight 131 lb 3.2 oz (59.5 kg), SpO2 98 %.   Neurological Examination   Mental Status: Alert, oriented to name. Dysarthria  Cranial Nerves: II: Discs flat bilaterally; Visual fields grossly normal, pupils equal, round, reactive to light and accommodation III,IV, VI: ptosis not present, extra-ocular motions intact bilaterally V,VII: smile symmetric, facial light touch sensation normal bilaterally VIII: hearing normal bilaterally IX,X: gag reflex present XI: bilateral shoulder shrug XII: midline tongue extension Motor: Right : Upper extremity   0/5    Left:     Upper extremity   5/5  Lower extremity   0/5     Lower extremity   5/5 Tone and bulk:normal tone throughout; no atrophy noted Sensory: Pinprick and light touch intact throughout, bilaterally Deep Tendon Reflexes: 1+ and symmetric throughout Plantars: Right: downgoing   Left: downgoing Cerebellar: normal finger-to-nose, normal rapid alternating movements and normal heel-to-shin test Gait: not tested      Laboratory Studies:   Basic Metabolic Panel: Recent Labs  Lab 10/26/17 1007  NA 131*  K 4.3  CL 92*  CO2 33*  GLUCOSE 111*  BUN 26*  CREATININE 0.93  CALCIUM 9.2    Liver Function Tests: Recent Labs  Lab 10/26/17 1007  AST 23  ALT 16  ALKPHOS 45  BILITOT 1.7*  PROT 6.6  ALBUMIN 3.6   No results for input(s): LIPASE, AMYLASE in the last 168 hours. No results for input(s): AMMONIA in the last 168 hours.  CBC: Recent Labs  Lab  10/26/17 1007  WBC 3.7  NEUTROABS 2.6  HGB 10.7*  HCT 33.1*  MCV 96.6  PLT 125*    Cardiac Enzymes: Recent Labs  Lab 10/26/17 1007  TROPONINI 0.03*    BNP: Invalid input(s): POCBNP  CBG: No results for input(s): GLUCAP in the last 168 hours.  Microbiology: Results for orders placed or performed during the hospital encounter of 09/23/17  MRSA PCR Screening     Status: None   Collection Time: 09/24/17  2:25 AM  Result Value Ref Range Status   MRSA by PCR NEGATIVE NEGATIVE Final    Comment:        The GeneXpert MRSA Assay (FDA approved for NASAL specimens only), is one component of a comprehensive MRSA colonization surveillance program. It is not intended to diagnose MRSA infection nor to guide or monitor treatment for MRSA infections. Performed at [facility], [address] [place], [place] [address]     Coagulation Studies: No results for input(s): LABPROT, INR in the last 72 hours.  Urinalysis:  Recent Labs  Lab 10/26/17 1117  COLORURINE YELLOW*  LABSPEC 1.014  PHURINE 6.0  GLUCOSEU NEGATIVE  HGBUR NEGATIVE  BILIRUBINUR NEGATIVE  KETONESUR NEGATIVE  PROTEINUR NEGATIVE  NITRITE NEGATIVE  LEUKOCYTESUR TRACE*    Lipid Panel:     Component Value Date/Time   CHOL 117 10/27/2017 0404   CHOL 136 11/02/2013 0540   TRIG 51 10/27/2017 0404   TRIG 85 11/02/2013 0540   HDL 39 (L) 10/27/2017 0404   HDL 34 (L) 11/02/2013 0540   CHOLHDL 3.0 10/27/2017 0404   VLDL 10 10/27/2017 0404   VLDL 17 11/02/2013 0540   LDLCALC 68 10/27/2017 0404   LDLCALC 85 11/02/2013 0540    HgbA1C:  Lab Results  Component Value Date   HGBA1C 5.5 10/27/2017     Other results: EKG: .Afib with normal rate   Imaging: Ct Head Wo Contrast  Result Date: 10/28/2017 CLINICAL DATA:  Slurred speech with weakness on left side, new. Persistent right-sided weakness EXAM: CT HEAD WITHOUT CONTRAST TECHNIQUE: Contiguous axial images were obtained from the base of the  skull through the vertex without intravenous contrast. COMPARISON:  October 27, 2017 FINDINGS: Brain: There is mild to moderate diffuse atrophy. There is no intracranial mass, hemorrhage, extra-axial fluid collection, or midline shift. There is an acute appearing infarct arising in the medial superior left frontal lobe adjacent to the falx. This infarct appears to involve the superior most aspect of the white matter of the centrum semiovale on the left. There is a minimal area of extension to the right of the falx in this area seen on axial slice 24 series 2. No other acute appearing infarct is evident. There is patchy small vessel disease in the centra semiovale bilaterally. Vascular: No hyperdense vessel evident. There is calcification in each carotid siphon and distal vertebral artery. Skull: Bony calvarium appears intact. Sinuses/Orbits: There is mucosal thickening in several ethmoid air cells bilaterally. Other visualized paranasal sinuses are clear. Orbits appear symmetric  bilaterally. There is evidence of cataract removals bilaterally. Other: Bony calvarium appears intact. There is debris in the left external auditory canal. IMPRESSION: Acute infarct in the anterior superior left frontal lobe with minimal extension to the right of the falx in the medial anterior right frontal lobe. This finding is best seen on axial slice 24 series 2. No other acute infarct evident. There is atrophy with patchy periventricular small vessel disease. No mass or hemorrhage evident. Foci of arterial vascular calcification noted. Mucosal thickening noted in several ethmoid air cells. These results were called by telephone at the time of interpretation on 10/28/2017 at 11:25 am to Dr. Katha Hamming , who verbally acknowledged these results. Electronically Signed   By: Bretta Bang III M.D.   On: 10/28/2017 11:25   Ct Head Wo Contrast  Result Date: 10/27/2017 CLINICAL DATA:  Persistent right-sided weakness EXAM: CT HEAD  WITHOUT CONTRAST TECHNIQUE: Contiguous axial images were obtained from the base of the skull through the vertex without intravenous contrast. COMPARISON:  10/26/2017 FINDINGS: Brain: Chronic atrophic and ischemic changes without acute abnormality. No findings to suggest acute hemorrhage, acute infarction or space-occupying mass lesion are noted. Vascular: No hyperdense vessel or unexpected calcification. Skull: Normal. Negative for fracture or focal lesion. Sinuses/Orbits: Stable mucosal thickening in the sphenoid sinus. Other: Previously seen venous air has resolved in the interval from the prior exam. IMPRESSION: Chronic atrophic and ischemic changes without acute abnormality. Electronically Signed   By: Alcide Clever M.D.   On: 10/27/2017 11:36   US Venous Img Lower Bilateral  Result Date: 10/28/2017 CLINICAL DATA:  84 year old female with bilateral lower extremity leg swelling and skin discoloration EXAM: BILATERAL LOWER EXTREMITY VENOUS DOPPLER ULTRASOUND TECHNIQUE: Gray-scale sonography with graded compression, as well as color Doppler and duplex ultrasound were performed to evaluate the lower extremity deep venous systems from the level of the common femoral vein and including the common femoral, femoral, profunda femoral, popliteal and calf veins including the posterior tibial, peroneal and gastrocnemius veins when visible. The superficial great saphenous vein was also interrogated. Spectral Doppler was utilized to evaluate flow at rest and with distal augmentation maneuvers in the common femoral, femoral and popliteal veins. COMPARISON:  None. FINDINGS: RIGHT LOWER EXTREMITY Common Femoral Vein: No evidence of thrombus. Normal compressibility, respiratory phasicity and response to augmentation. Increased pulsatility of the venous waveforms. Saphenofemoral Junction: No evidence of thrombus. Normal compressibility and flow on color Doppler imaging. Profunda Femoral Vein: No evidence of thrombus. Normal  compressibility and flow on color Doppler imaging. Femoral Vein: No evidence of thrombus. Normal compressibility, respiratory phasicity and response to augmentation. Popliteal Vein: No evidence of thrombus. Normal compressibility, respiratory phasicity and response to augmentation. Calf Veins: No evidence of thrombus. Normal compressibility and flow on color Doppler imaging. Superficial Great Saphenous Vein: No evidence of thrombus. Normal compressibility. Venous Reflux:  None. Other Findings:  None. LEFT LOWER EXTREMITY Common Femoral Vein: No evidence of thrombus. Normal compressibility, respiratory phasicity and response to augmentation. Increased pulsatility of the venous waveforms. Saphenofemoral Junction: No evidence of thrombus. Normal compressibility and flow on color Doppler imaging. Profunda Femoral Vein: No evidence of thrombus. Normal compressibility and flow on color Doppler imaging. Femoral Vein: No evidence of thrombus. Normal compressibility, respiratory phasicity and response to augmentation. Popliteal Vein: No evidence of thrombus. Normal compressibility, respiratory phasicity and response to augmentation. Calf Veins: No evidence of thrombus. Normal compressibility and flow on color Doppler imaging. Superficial Great Saphenous Vein: No evidence of thrombus. Normal compressibility. Venous  Reflux:  None. Other Findings:  None. IMPRESSION: 1. No evidence of deep venous thrombosis in either lower extremity. 2. Increased pulsatility of the venous waveforms bilaterally consistent with elevated right heart pressures. Differential considerations include tricuspid regurgitation, right heart failure, pulmonary hypertension and long-standing COPD. Electronically Signed   By: Malachy Moan M.D.   On: 10/28/2017 15:55     Assessment/Plan:  84 y.o. female with a known history of essential hypertension, GERD, arthritis, comes from  independent living secondary to right sided weakness.  Patient lives at  independent facility and she is very independent but started to have sudden onset of right leg weakness unable to ambulate.  Pt does have hx of A-fib but anticoagulation was stopped about a year ago.  PPM/Defib with low EF  - Pt appears to be worsening respiratory wise - She is likely micro aspirating - given then age likely poor prognosis - She is DNR/DNI - Would strong consider palliative evaluation  - I do appreciate assistance from primary team and speech therapy - No anticoagulation for 7-10 days.   Pauletta Browns  10/29/2017, 10:46 AM

## 2017-10-29 NOTE — Progress Notes (Signed)
Called to assess patient.  Patient has audible congested non productive cough due to her cough is very weak.  Patient has diminished breath sounds in the bases with no wheezing and has upper airway rhonchus sounds.  Patient maintains 99% oxygen saturations on 3lpm [place].  There is concern via RN and speech that patient could be aspirating.

## 2017-10-29 NOTE — Progress Notes (Signed)
Physical Therapy Treatment Patient Details Name: Joanna Roberts MRN: [id number] DOB: Oct 03, 1928 Today's Date: 10/29/2017    History of Present Illness Joanna Roberts is an 84yo female who comes to [facility] on 7/3 after sudden insidious RLE paralysis. Head CT negative, but pt is being worked up for TIA v CVA at this time. Pt is pending doppler to r/o DVT hence exam is limited on this date. Since admission, pt has had progressive loss of RUE as well and nwo speech changes as of 7/5. PTA pt lived at [facility] AMB with rollator, independent in ADL.     PT Comments    Pt in bed, ready for session.  "When am I going to walk again?"  Session focused on BLE tx as below and bed mobility skills.  Pt was able to assist with roll to right side with good effort but unable to turn fully or hold position in side lying without support.  Mod a to roll and hold positioning.   2 x 5 to right,  Unable to assist with roll to left.  Bed pads changed due to being wet.  Pt with frequent unproductive cough during session.  Sats at 98% during session with O2 support.   Follow Up Recommendations  SNF     Equipment Recommendations       Recommendations for Other Services       Precautions / Restrictions Precautions Precautions: Fall Restrictions Weight Bearing Restrictions: No    Mobility  Bed Mobility Overal bed mobility: Needs Assistance Bed Mobility: Rolling Rolling: Max assist            Transfers                 General transfer comment: unsafe to attempt  Ambulation/Gait                 Stairs             Wheelchair Mobility    Modified Rankin (Stroke Patients Only)       Balance                                            Cognition Arousal/Alertness: Awake/alert Behavior During Therapy: WFL for tasks assessed/performed Overall Cognitive Status: Within Functional Limits for tasks assessed                                 General  Comments: A&Ox4, follows all commands, pleasant      Exercises Other Exercises Other Exercises: BLE AAROM LLE, PROM RLE x 10.  Some tone noted in RUE with coughing Other Exercises: rolling left and right for incontinence care, rolling to right 2 x 5     General Comments        Pertinent Vitals/Pain Pain Assessment: No/denies pain    Home Living                      Prior Function            PT Goals (current goals can now be found in the care plan section) Progress towards PT goals: Progressing toward goals    Frequency    7X/week      PT Plan Current plan remains appropriate    Co-evaluation  AM-PAC PT "6 Clicks" Daily Activity  Outcome Measure  Difficulty turning over in bed (including adjusting bedclothes, sheets and blankets)?: Unable Difficulty moving from lying on back to sitting on the side of the bed? : Unable Difficulty sitting down on and standing up from a chair with arms (e.g., wheelchair, bedside commode, etc,.)?: Unable Help needed moving to and from a bed to chair (including a wheelchair)?: Total Help needed walking in hospital room?: Total Help needed climbing 3-5 steps with a railing? : Total 6 Click Score: 6    End of Session         Hemiplegia - Right/Left: Right Hemiplegia - dominant/non-dominant: Dominant     Time: 2951-8841 PT Time Calculation (min) (ACUTE ONLY): 23 min  Charges:  $Therapeutic Exercise: 8-22 mins $Therapeutic Activity: 8-22 mins                    G Codes:       Danielle Dess, PTA 10/29/17, 8:54 AM

## 2017-10-29 NOTE — Progress Notes (Addendum)
Patient is increasingly coughing after meals, notified Natalia Leatherwood, speech therapy to assess patient, notified Dr. Luberta Mutter of patient's condition, Dr.Konidena spoke with the patient's POA Thomes Dinning. POA does not want a peg tube placed, MD aware. Palliative consult requested by nurse.

## 2017-10-30 LAB — GLUCOSE, CAPILLARY: Glucose-Capillary: 107 mg/dL — ABNORMAL HIGH (ref 70–99)

## 2017-10-30 MED ORDER — ASPIRIN 300 MG RE SUPP
300.0000 mg | Freq: Every day | RECTAL | Status: DC
  Administered 2017-10-30: 300 mg via RECTAL
  Filled 2017-10-30: qty 1

## 2017-10-30 MED ORDER — LISINOPRIL 5 MG PO TABS: 5.0000 mg | ORAL_TABLET | Freq: Every day | ORAL | Status: DC

## 2017-10-30 MED ORDER — ONDANSETRON HCL 4 MG/2ML IJ SOLN
4.0000 mg | Freq: Four times a day (QID) | INTRAMUSCULAR | Status: DC | PRN
  Administered 2017-10-30 – 2017-11-01 (×3): 4 mg via INTRAVENOUS
  Filled 2017-10-30 (×3): qty 2

## 2017-10-30 MED ORDER — LORAZEPAM 2 MG/ML IJ SOLN
1.0000 mg | Freq: Four times a day (QID) | INTRAMUSCULAR | Status: DC | PRN
  Administered 2017-10-30 – 2017-11-02 (×3): 1 mg via INTRAVENOUS
  Filled 2017-10-30 (×3): qty 1

## 2017-10-30 MED ORDER — DEXTROSE-NACL 5-0.45 % IV SOLN
INTRAVENOUS | Status: DC
  Administered 2017-10-30: 13:00:00 via INTRAVENOUS

## 2017-10-30 NOTE — Progress Notes (Signed)
SUBJECTIVE: patient has right-sided weakness and congestion   Vitals:   10/29/17 1951 10/30/17 0000 10/30/17 0553 10/30/17 0750  BP: (!) 137/91 92/78 92/78  100/74  Pulse: 84  85 78  Resp: 20  18 16   Temp: 98.1 F (36.7 C) (!) 97.5 F (36.4 C) 97.7 F (36.5 C) 97.6 F (36.4 C)  TempSrc: Oral Oral Oral Oral  SpO2: 94% 99% 98% 100%  Weight:      Height:        Intake/Output Summary (Last 24 hours) at 10/30/2017 1138 Last data filed at 10/30/2017 0557 Gross per 24 hour  Intake 729 ml  Output 1000 ml  Net -271 ml    LABS: Basic Metabolic Panel: No results for input(s): NA, K, CL, CO2, GLUCOSE, BUN, CREATININE, CALCIUM, MG, PHOS in the last 72 hours. Liver Function Tests: No results for input(s): AST, ALT, ALKPHOS, BILITOT, PROT, ALBUMIN in the last 72 hours. No results for input(s): LIPASE, AMYLASE in the last 72 hours. CBC: No results for input(s): WBC, NEUTROABS, HGB, HCT, MCV, PLT in the last 72 hours. Cardiac Enzymes: No results for input(s): CKTOTAL, CKMB, CKMBINDEX, TROPONINI in the last 72 hours. BNP: Invalid input(s): POCBNP D-Dimer: No results for input(s): DDIMER in the last 72 hours. Hemoglobin A1C: No results for input(s): HGBA1C in the last 72 hours. Fasting Lipid Panel: No results for input(s): CHOL, HDL, LDLCALC, TRIG, CHOLHDL, LDLDIRECT in the last 72 hours. Thyroid Function Tests: No results for input(s): TSH, T4TOTAL, T3FREE, THYROIDAB in the last 72 hours.  Invalid input(s): FREET3 Anemia Panel: No results for input(s): VITAMINB12, FOLATE, FERRITIN, TIBC, IRON, RETICCTPCT in the last 72 hours.   PHYSICAL EXAM General: Well developed, well nourished, in no acute distress HEENT:  Normocephalic and atramatic Neck:  No JVD.  Lungs: Clear bilaterally to auscultation and percussion. Heart: HRRR . Normal S1 and S2 without gallops or murmurs.  Abdomen: Bowel sounds are positive, abdomen soft and non-tender  Msk:  Back normal, normal gait. Normal strength  and tone for age. Extremities: No clubbing, cyanosis or edema.   Neuro: Alert and oriented X 3. Psych:  Good affect, responds appropriately  TELEMETRY: 100% VVI paced rhythm  ASSESSMENT AND PLAN: congestive heart failure with severe left ventricular systolic dysfunction with left ventricle ejection fraction 30% and right-sided weakness secondary to CVA. Will add lisinopril 5 mg by mouth daily as blood pressure is low cannot give any higher dose or entresto. Continue Lasix and rest of the medications. Treat the patient conservatively.  Active Problems:   Left leg weakness   Pressure injury of skin    Eilish Mcdaniel A, MD, [facility] 10/30/2017 11:38 AM

## 2017-10-30 NOTE — Progress Notes (Signed)
[facility]   PATIENT NAME: Joanna Roberts    MR#:  [id number]  DATE OF BIRTH:  1928/12/22  Patient completely flaccid on the right side and also have some garbled speech.  Patient had multiple episodes of diarrhea yesterday after she was given Fleet enema and now she has generalized weakness.  Patient healthcare power of attorney Lorin Picket is at the bedside.  Requesting Ativan IV for comfort.  CHIEF COMPLAINT:   Chief Complaint  Patient presents with  . Extremity Weakness    REVIEW OF SYSTEMS:   ROS  Patient is alert awake oriented but has garbled speech and difficult to understand  CONSTITUTIONAL: No fever, fatigue   Does have dysarthria, right-sided weakness EYES: No blurred or double vision.  EARS, NOSE, AND THROAT: No tinnitus or ear pain.  RESPIRATORY: No shortness of breath today cARDIOVASCULAR: No chest pain, orthopnea, edema.  GASTROINTESTINAL: No nausea, vomiting, diarrhea or abdominal pain.  GENITOURINARY: No dysuria, hematuria.  ENDOCRINE: No polyuria, nocturia,  HEMATOLOGY: No anemia, easy bruising or bleeding SKIN: No rash or lesion. MUSCULOSKELETAL: No joint pain or arthritis.   NEUROLOGIC: Right-sided weakness, garbled speech  PSYCHIATRY: she is anxious  DRUG ALLERGIES:   Allergies  Allergen Reactions  . Lortab [Hydrocodone-Acetaminophen] Itching, Swelling and Rash  . Doxycycline Diarrhea  . Macrobid [Nitrofurantoin Monohyd Macro] Diarrhea and Nausea And Vomiting  . Nitrofurantoin Diarrhea, Nausea Only and Nausea And Vomiting    VITALS:  Blood pressure 100/74, pulse 78, temperature 97.6 F (36.4 C), temperature source Oral, resp. rate 16, height 5\' 4"  (1.626 m), weight 59.5 kg (131 lb 3.2 oz), SpO2 100 %.  PHYSICAL EXAMINATION:  GENERAL:  84 y.o.-year-old patient lying in the bed with no acute distress.  EYES: Pupils equal, round, reactive to light and accommodation. No scleral icterus. Extraocular muscles  intact.  HEENT: Head atraumatic, normocephalic. Oropharynx and nasopharynx clear.  NECK:  Supple, no jugular venous distention. No thyroid enlargement, no tenderness.  LUNGS: Normal breath sounds bilaterally, no wheezing, rales,rhonchi or crepitation. No use of accessory muscles of respiration.  CARDIOVASCULAR: S1, S2 normal. No murmurs, rubs, or gallops.  ABDOMEN: Soft, nontender, nondistended. Bowel sounds present. No organomegaly or mass.  EXTREMITIES: No pedal edema, cyanosis, or clubbing.  NEUROLOGIC: Dysarthria, cranial nerves II through XII intact, right upper and lower extremity power 0 out of 5, left upper, lower extremity 5/5 Patient also complains of numbness on the right side.  PSYCHIATRIC: The patient is alert and oriented x 3.  SKIN: No obvious rash, lesion, or ulcer.    LABORATORY PANEL:   CBC Recent Labs  Lab 10/26/17 1007  WBC 3.7  HGB 10.7*  HCT 33.1*  PLT 125*   ------------------------------------------------------------------------------------------------------------------  Chemistries  Recent Labs  Lab 10/26/17 1007  NA 131*  K 4.3  CL 92*  CO2 33*  GLUCOSE 111*  BUN 26*  CREATININE 0.93  CALCIUM 9.2  AST 23  ALT 16  ALKPHOS 45  BILITOT 1.7*   ------------------------------------------------------------------------------------------------------------------  Cardiac Enzymes Recent Labs  Lab 10/26/17 1007  TROPONINI 0.03*   ------------------------------------------------------------------------------------------------------------------  RADIOLOGY:  US Venous Img Lower Bilateral  Result Date: 10/28/2017 CLINICAL DATA:  84 year old female with bilateral lower extremity leg swelling and skin discoloration EXAM: BILATERAL LOWER EXTREMITY VENOUS DOPPLER ULTRASOUND TECHNIQUE: Gray-scale sonography with graded compression, as well as color Doppler and duplex ultrasound were performed to evaluate the lower extremity deep venous systems from the  level of the common femoral vein and including  the common femoral, femoral, profunda femoral, popliteal and calf veins including the posterior tibial, peroneal and gastrocnemius veins when visible. The superficial great saphenous vein was also interrogated. Spectral Doppler was utilized to evaluate flow at rest and with distal augmentation maneuvers in the common femoral, femoral and popliteal veins. COMPARISON:  None. FINDINGS: RIGHT LOWER EXTREMITY Common Femoral Vein: No evidence of thrombus. Normal compressibility, respiratory phasicity and response to augmentation. Increased pulsatility of the venous waveforms. Saphenofemoral Junction: No evidence of thrombus. Normal compressibility and flow on color Doppler imaging. Profunda Femoral Vein: No evidence of thrombus. Normal compressibility and flow on color Doppler imaging. Femoral Vein: No evidence of thrombus. Normal compressibility, respiratory phasicity and response to augmentation. Popliteal Vein: No evidence of thrombus. Normal compressibility, respiratory phasicity and response to augmentation. Calf Veins: No evidence of thrombus. Normal compressibility and flow on color Doppler imaging. Superficial Great Saphenous Vein: No evidence of thrombus. Normal compressibility. Venous Reflux:  None. Other Findings:  None. LEFT LOWER EXTREMITY Common Femoral Vein: No evidence of thrombus. Normal compressibility, respiratory phasicity and response to augmentation. Increased pulsatility of the venous waveforms. Saphenofemoral Junction: No evidence of thrombus. Normal compressibility and flow on color Doppler imaging. Profunda Femoral Vein: No evidence of thrombus. Normal compressibility and flow on color Doppler imaging. Femoral Vein: No evidence of thrombus. Normal compressibility, respiratory phasicity and response to augmentation. Popliteal Vein: No evidence of thrombus. Normal compressibility, respiratory phasicity and response to augmentation. Calf Veins: No  evidence of thrombus. Normal compressibility and flow on color Doppler imaging. Superficial Great Saphenous Vein: No evidence of thrombus. Normal compressibility. Venous Reflux:  None. Other Findings:  None. IMPRESSION: 1. No evidence of deep venous thrombosis in either lower extremity. 2. Increased pulsatility of the venous waveforms bilaterally consistent with elevated right heart pressures. Differential considerations include tricuspid regurgitation, right heart failure, pulmonary hypertension and long-standing COPD. Electronically Signed   By: Malachy Moan M.D.   On: 10/28/2017 15:55    EKG:   Orders placed or performed during the hospital encounter of 10/26/17  . ED EKG  . ED EKG  . EKG 12-Lead  . EKG 12-Lead  . EKG 12-Lead  . EKG 12-Lead    ASSESSMENT AND PLAN:  84 year old female with multiple medical problems of chronic systolic heart failure with EF 25 to 30%, chronic atrial fibrillation not on anticoagulation, GERD, hypertension, hypothyroidism, chronic respiratory failure with oxygen 2 L at baseline comes in with [facility] independent living facility because of said weakness of the started yesterday morning and patient is admitted for evaluation of TIA./CVA. 1.  Acute right-sided weakness likely due to embolic stroke patient ; since CT head today showed infarct in the left frontal lobe.  I spoke with healthcare power of attorney and wants to talk to palliative care team, consult placed.  Patient is n.p.o. because of high risk for aspiration, hold all p.o. medicines, use rectal aspirin, healthcare power of attorney Thomes Dinning told me that she does not want any tubes including PEG.  2.  Chronic systolic heart failure with diffuse hypokinesia, EF 25 to 30%: Followed by Dr. Lady Gary from cardiology so cardiology consult is requested, patient has history of severe MR, TR, followed by CHF clinic.  Hold Coreg, diuretic because patient is n.p.o.  Patient has poor hydration so started on  gentle hydration.  3.  Chronic atrial fibrillation, status post ICD, PPM placement.  Needs to wait for at least 7 to 10 days to start full dose anticoagulation as per neurology  recommendation due to acute stroke  #4. right leg swelling, I do not see any right leg swelling, but registered nurse concerned about that so we did ultrasound of right leg did not show any DVT.  5.diarrhea secondary to laxative:  Patient is a DNR, DNI, spoke with healthcare power of attorney Thomes Dinning and agree with meeting with tem.    use Ativan  \  all the records are reviewed and case discussed with Care Management/Social Workerr. Management plans discussed with the patient, family and they are in agreement.  CODE STATUS: DNR  TOTAL TIME TAKING CARE OF THIS PATIENT: 40 minutes   POSSIBLE D/C IN 2-3 DAYS, DEPENDING ON CLINICAL CONDITION. More than 50% of the time, spent in counseling, coordination of care, discussing with patient, reviewing the old charts.  Katha Hamming M.D on 10/30/2017 at 12:10 PM  Between 7am to 6pm - Pager - [phone]  After 6pm go to [email or web address] - password [email or web address] [facility]  [facility]  Office  [phone]  CC: Primary care physician; Patrice Paradise, MD   Note: This dictation was prepared with Dragon dictation along with smaller phrase technology. Any transcriptional errors that result from this process are unintentional.

## 2017-10-30 NOTE — Progress Notes (Signed)
Physical Therapy Treatment Patient Details Name: Joanna Roberts MRN: [id number] DOB: 1928-08-01 Today's Date: 10/30/2017    History of Present Illness Joanna Roberts is an 84yo female who comes to [facility] on 7/3 after sudden insidious RLE paralysis. Head CT negative, but pt is being worked up for TIA v CVA at this time. Pt is pending doppler to r/o DVT hence exam is limited on this date. Since admission, pt has had progressive loss of RUE as well and nwo speech changes as of 7/5. PTA pt lived at [facility] ILF AMB with rollator, independent in ADL.     PT Comments    Patient progressing slowly; Instructed patient in LE strengthening exercise in bed. Per RN, she prefers patient to stay in bed as she is NPO, fatigued from bowel movement and hypotensive; Patient denies any pain or discomfort. She was unable to initiate movement in RLE requiring PROM with exercise. Patient reports minimal fatigue with advanced exercise on LLE. Patient would benefit from additional skilled PT intervention to improve strength, balance and gait safety;    Follow Up Recommendations  SNF;Supervision/Assistance - 24 hour     Equipment Recommendations  Other (comment)(to be determined by receiveing facility. )    Recommendations for Other Services       Precautions / Restrictions Precautions Precautions: Fall Restrictions Weight Bearing Restrictions: No    Mobility  Bed Mobility                  Transfers                    Ambulation/Gait                 Stairs             Wheelchair Mobility    Modified Rankin (Stroke Patients Only)       Balance                                            Cognition Arousal/Alertness: Lethargic Behavior During Therapy: WFL for tasks assessed/performed Overall Cognitive Status: Within Functional Limits for tasks assessed                                 General Comments: A&Ox4, follows all commands,  pleasant      Exercises Other Exercises Other Exercises: Instructed patient in LE strengthening exercise: AROM on LLE, PROM on RLE: ankle pump, short arc quad, heel slide, hip abduction/adduction to midline all x15 reps;  Required mod VCs for correct exercise technique including to increase ROM to tolerance; required increased time due to fatigue;  Other Exercises: attempted bridge with mod A from PT to hold RLE into position, but patient unable to tolerance; Other Exercises: Patient required max A +1 for repositioning for comfort with RUE supported, pillow under legs,  and call bell/needs in reach;     General Comments        Pertinent Vitals/Pain Pain Assessment: No/denies pain    Home Living                      Prior Function            PT Goals (current goals can now be found in the care plan section) Acute Rehab PT Goals Patient  Stated Goal: to get better and return home PT Goal Formulation: With patient Time For Goal Achievement: 11/05/2017 Potential to Achieve Goals: Fair Progress towards PT goals: Progressing toward goals    Frequency    7X/week      PT Plan Current plan remains appropriate    Co-evaluation              AM-PAC PT "6 Clicks" Daily Activity  Outcome Measure  Difficulty turning over in bed (including adjusting bedclothes, sheets and blankets)?: Unable Difficulty moving from lying on back to sitting on the side of the bed? : Unable Difficulty sitting down on and standing up from a chair with arms (e.g., wheelchair, bedside commode, etc,.)?: Unable Help needed moving to and from a bed to chair (including a wheelchair)?: Total Help needed walking in hospital room?: Total Help needed climbing 3-5 steps with a railing? : Total 6 Click Score: 6    End of Session   Activity Tolerance: Patient limited by lethargy;Treatment limited secondary to medical complications (Comment)(RN requested patient stay in bed as she is weak from bowel  movement, NPO and hypotensive -M.Baron Hamper 10/30/17) Patient left: in bed;with SCD's reapplied;with call bell/phone within reach;with family/visitor present;with bed alarm set(RUE supported on pillow, heels floated, SCDS plugged in and turned on. ) Nurse Communication: Mobility status PT Visit Diagnosis: Muscle weakness (generalized) (M62.81);Difficulty in walking, not elsewhere classified (R26.2);Hemiplegia and hemiparesis Hemiplegia - Right/Left: Right Hemiplegia - dominant/non-dominant: Dominant Hemiplegia - caused by: Unspecified     Time: 5400-8676 PT Time Calculation (min) (ACUTE ONLY): 18 min  Charges:  $Therapeutic Exercise: 8-22 mins                    G Codes:          Joanna Roberts PT, DPT 10/30/2017, 12:10 PM

## 2017-10-31 ENCOUNTER — Inpatient Hospital Stay: Payer: PPO

## 2017-10-31 DIAGNOSIS — Z515 Encounter for palliative care: Secondary | ICD-10-CM

## 2017-10-31 DIAGNOSIS — R627 Adult failure to thrive: Secondary | ICD-10-CM

## 2017-10-31 DIAGNOSIS — Z66 Do not resuscitate: Secondary | ICD-10-CM

## 2017-10-31 DIAGNOSIS — R531 Weakness: Secondary | ICD-10-CM

## 2017-10-31 MED ORDER — CARVEDILOL 3.125 MG PO TABS
3.1250 mg | ORAL_TABLET | Freq: Two times a day (BID) | ORAL | Status: DC
  Administered 2017-10-31 – 2017-11-01 (×2): 3.125 mg via ORAL
  Filled 2017-10-31 (×2): qty 1

## 2017-10-31 MED ORDER — ASPIRIN 81 MG PO CHEW
81.0000 mg | CHEWABLE_TABLET | Freq: Every day | ORAL | Status: DC
  Administered 2017-10-31 – 2017-11-02 (×3): 81 mg via ORAL
  Filled 2017-10-31 (×3): qty 1

## 2017-10-31 MED ORDER — FUROSEMIDE 40 MG PO TABS
40.0000 mg | ORAL_TABLET | Freq: Every day | ORAL | Status: DC
  Administered 2017-10-31 – 2017-11-02 (×3): 40 mg via ORAL
  Filled 2017-10-31 (×3): qty 1

## 2017-10-31 NOTE — Evaluation (Addendum)
Objective Swallowing Evaluation: Type of Study: MBS-Modified Barium Swallow Study   Patient Details  Name: TYANA BUTZER MRN: [id number] Date of Birth: [birth year]  Today's Date: 10/31/2017 Time: SLP Start Time (ACUTE ONLY): 1030 -SLP Stop Time (ACUTE ONLY): 1130  SLP Time Calculation (min) (ACUTE ONLY): 60 min   Past Medical History:  Past Medical History:  Diagnosis Date  . AICD (automatic cardioverter/defibrillator) present   . Allergy   . Anemia   . Arthritis   . Cataract   . CHF (congestive heart failure) (HCC)   . Depression   . Dysrhythmia   . GERD (gastroesophageal reflux disease)   . Heart murmur   . History of TIAs   . Hypertension   . Hypothyroidism   . Myocardial infarction (HCC)   . Osteoporosis   . Presence of permanent cardiac pacemaker   . Thyroid disease    Past Surgical History:  Past Surgical History:  Procedure Laterality Date  . ABDOMINAL HYSTERECTOMY    . APPENDECTOMY    . HERNIA REPAIR    . IMPLANTABLE CARDIOVERTER DEFIBRILLATOR (ICD) GENERATOR CHANGE Left 07/27/2017   Procedure: ICD GENERATOR CHANGE;  Surgeon: Marcina Millard, MD;  Location: [facility] ORS;  Service: Cardiovascular;  Laterality: Left;  . INSERT / REPLACE / REMOVE PACEMAKER    . LOWER EXTREMITY ANGIOGRAPHY Left 02/08/2017   Procedure: Lower Extremity Angiography;  Surgeon: Renford Dills, MD;  Location: [facility] INVASIVE CV LAB;  Service: Cardiovascular;  Laterality: Left;  . PERIPHERAL VASCULAR CATHETERIZATION Right 10/08/2014   Procedure: Lower Extremity Angiography;  Surgeon: Renford Dills, MD;  Location: [facility] INVASIVE CV LAB;  Service: Cardiovascular;  Laterality: Right;  . PERIPHERAL VASCULAR CATHETERIZATION  10/08/2014   Procedure: Lower Extremity Intervention;  Surgeon: Renford Dills, MD;  Location: [facility] INVASIVE CV LAB;  Service: Cardiovascular;;  . PERIPHERAL VASCULAR CATHETERIZATION N/A 03/23/2016   Procedure: Visceral Angiography;  Surgeon: Renford Dills, MD;   Location: [facility] INVASIVE CV LAB;  Service: Cardiovascular;  Laterality: N/A;  . pessary     HPI: Pt is a 84 y.o. female with a history of anemia, arthritis, CHF (EF 25-30%), GERD, hypertension, hypothyroidism, MI, on 2L O2 at baseline, who presents to the ED for right leg weakness that started this am and was unable to walk.  Patient [facility] from [facility] [place].  No facial droop or speech issues.  She was last known normal when she went to bed that night.  She denies any recent illness or other complaints.  Patient lives at independent facility and she is very independent but started to have sudden onset of right leg weakness unable to ambulate.  Pt also c/o being unable to use her R arm this morning at this evaluation.  OT/PT following.  Pt is on a mech soft diet since admission ~2 days ago. Pt has c/o intermittent coughing when drinking liquids at home but did not report an increase if frequency since admission. NSG reported increased R sided weakness during this admission and concern for dysphagia. Pt has a history of congestive heart failure and atrial fibrillation on no anticoagulants currently d/t stroke; continue w/ Lasix per Cardiologist. Regarding swallowing, pt stated she has had trouble swallowing at home prior to this admission - "gets easily choked and strangled". Family endorsed this as well. Per family, pt has a h/o Hiatal Hernia.    Subjective: pt awake, verbally conversive and engaged w/ SLP. She stated she was "hungry"    Assessment / Plan / Recommendation  CHL IP CLINICAL IMPRESSIONS 10/31/2017  Clinical Impression Pt appears to present w/ MILD+ oropharyngeal phase dysphagia w/ min increased risk for aspiration from an oropharyngeal phase standpoint. HOWEVER, pt is noted to have a MODERATE size ZENKER'S DIVERTICULUM which captured both food and liquid material from trials during this eval. Of important note, RETROGRADE ACTIVITY caused bolus material from the Zenker's Diverticulum to move  back into the Pharynx during the trials w/ reduced sensation and cough response from the pt herself. Pt is at HIGH risk for Aspiration of food/liquid material coming out of the Zenker's Diverticulum back into the Pharynx thus impacting her airway and Pulmonary status. During the oral phase, pt exhibited min decrease oral phase sensation, lingual control, and oral clearing on the Right side resulting in MIN+ R oral residue. Pt was verbally cued to use lingual and finger sweeps to clear the Right buccal area of any bolus residue from the food trials. Pt was easily distracted so more frequent cues given to attend to Right oral clearing b/t trials. During the pharyngeal phase, pt exhibited MILD delay in pharyngeal swallow initiation resulting in thin liquid trials spilling from the Valleculae to the Pyriform Sinuses during the swallowing - MIN+ laryngeal penetration into the laryngeal vestibule was noted during 2/4 trials of thin liquids. Timing of the pharyngeal swallow initiation appeared more timely w/ trials of NECTAR consistency liquids w/ NO laryngeal penetration noted during swallowing of trials. Of note, NO aspiration occurred w/ any trials during this study.  Noted Min pharyngeal residue remaining in the pharynx which pt was able to reduce and/or clear w/ completion of a f/u, dry swallow(often done independently). Pt does require verbal cues for follow through w/ strategies and precautions.   SLP Visit Diagnosis Dysphagia, oropharyngeal phase (R13.12);Dysphagia, pharyngoesophageal phase (R13.14)  Attention and concentration deficit following --  Frontal lobe and executive function deficit following --  Impact on safety and function Moderate aspiration risk      CHL IP TREATMENT RECOMMENDATION 10/31/2017  Treatment Recommendations Therapy as outlined in treatment plan below     Prognosis 10/31/2017  Prognosis for Safe Diet Advancement Fair  Barriers to Reach Goals Severity of deficits;Time post  onset;Cognitive deficits  Barriers/Prognosis Comment --    CHL IP DIET RECOMMENDATION 10/31/2017  SLP Diet Recommendations Dysphagia 2 (Fine chop) solids;Nectar thick liquid  Liquid Administration via Cup;No straw  Medication Administration Crushed with puree  Compensations Minimize environmental distractions;Slow rate;Small sips/bites;Lingual sweep for clearance of pocketing;Follow solids with liquid;Clear throat intermittently  Postural Changes Remain semi-upright after after feeds/meals (Comment);Seated upright at 90 degrees      CHL IP OTHER RECOMMENDATIONS 10/31/2017  Recommended Consults Consider GI evaluation;Consider esophageal assessment  Oral Care Recommendations Oral care BID;Patient independent with oral care;Staff/trained caregiver to provide oral care  Other Recommendations Order thickener from pharmacy;Prohibited food (jello, ice cream, thin soups);Remove water pitcher;Have oral suction available      CHL IP FOLLOW UP RECOMMENDATIONS 10/31/2017  Follow up Recommendations Skilled Nursing facility      [facility] IP FREQUENCY AND DURATION 10/31/2017  Speech Therapy Frequency (ACUTE ONLY) min 3x week  Treatment Duration 2 weeks           CHL IP ORAL PHASE 10/31/2017  Oral Phase Impaired  Oral - Pudding Teaspoon --  Oral - Pudding Cup --  Oral - Honey Teaspoon --  Oral - Honey Cup --  Oral - Nectar Teaspoon --  Oral - Nectar Cup --  Oral - Nectar Straw --  Oral -  Thin Teaspoon --  Oral - Thin Cup --  Oral - Thin Straw --  Oral - Puree --  Oral - Mech Soft --  Oral - Regular --  Oral - Multi-Consistency --  Oral - Pill --  Oral Phase - Comment min decrease oral phase sensation, lingual control, and oral clearing on the Right side resulting in MIN+ R oral residue. Pt was verbally cued to use lingual and finger sweeps to clear the Right buccal area of any bolus residue from the food trials.     CHL IP PHARYNGEAL PHASE 10/31/2017  Pharyngeal Phase Impaired  Pharyngeal- Pudding  Teaspoon --  Pharyngeal --  Pharyngeal- Pudding Cup --  Pharyngeal --  Pharyngeal- Honey Teaspoon --  Pharyngeal --  Pharyngeal- Honey Cup --  Pharyngeal --  Pharyngeal- Nectar Teaspoon --  Pharyngeal --  Pharyngeal- Nectar Cup --  Pharyngeal --  Pharyngeal- Nectar Straw --  Pharyngeal --  Pharyngeal- Thin Teaspoon --  Pharyngeal --  Pharyngeal- Thin Cup --  Pharyngeal --  Pharyngeal- Thin Straw --  Pharyngeal --  Pharyngeal- Puree --  Pharyngeal --  Pharyngeal- Mechanical Soft --  Pharyngeal --  Pharyngeal- Regular --  Pharyngeal --  Pharyngeal- Multi-consistency --  Pharyngeal --  Pharyngeal- Pill --  Pharyngeal --  Pharyngeal Comment MILD delay in pharyngeal swallow initiation resulting in thin liquid trials spilling from the Valleculae to the Pyriform Sinuses during the swallowing - MIN+ laryngeal penetration into the laryngeal vestibule was noted during 2/4 trials of thin liquids. Timing of the pharyngeal swallow initiation appeared more timely w/ trials of NECTAR consistency liquids w/ NO laryngeal penetration noted during swallowing of trials. Of note, NO aspiration occurred w/ any trials during this study.  Noted Min pharyngeal residue remaining in the pharynx which pt was able to reduce and/or clear w/ completion of a f/u, dry swallow(often done independently).      CHL IP CERVICAL ESOPHAGEAL PHASE 10/31/2017  Cervical Esophageal Phase Impaired  Pudding Teaspoon --  Pudding Cup --  Honey Teaspoon --  Honey Cup --  Nectar Teaspoon --  Nectar Cup --  Nectar Straw --  Thin Teaspoon --  Thin Cup --  Thin Straw --  Puree --  Mechanical Soft --  Regular --  Multi-consistency --  Pill --  Cervical Esophageal Comment pt has a moderate appearing size ZENKER'S DIVERTICULUM in the Cervical Esophagus which collected both food and liquid trials - RETROGRADE bolus activity occurred w/ bolus material re-entering the Pharynx x2 causing a cough response x1 from pt. Suspect pt  could be insensate to the degree of Retrograde bolus activity d/t he Chronicity of the deficit/situation(suspect pt has had this Zenker's Diverticulum for some time and may be related to pt's coughing presentation during meals w/ family as they reported).     No flowsheet data found.      Jerilynn Som, MS, CCC-SLP Kersti Scavone 10/31/2017, 6:54 PM

## 2017-10-31 NOTE — Progress Notes (Signed)
Occupational Therapy Treatment Patient Details Name: VALORA NORELL MRN: [id number] DOB: 09/07/1928 Today's Date: 10/31/2017    History of present illness Rockelle Heuerman is an 84yo female who comes to [facility] on 7/3 after sudden insidious RLE paralysis. Head CT negative, but pt is being worked up for TIA v CVA at this time. Pt is pending doppler to r/o DVT hence exam is limited on this date. Since admission, pt has had progressive loss of RUE as well and nwo speech changes as of 7/5. PTA pt lived at [facility] IL[facility] AMB with rollator, independent in ADL.    OT comments  Pt seen for OT tx this date. Pt agreeable to therapy. Pt then became tearful when asked how she's feeling, with pt stating, "I feel awful." Endorses "discomfort" in neck and R UE/LE.  OT provided emotional support; pt expressed appreciation. Mild improvement in comfort with repositioning to minimize skin breakdown. Pt required max assist for repositioning. RUE repositioned on pillows to minimize edema. Mild edema noted in R hand this date. OT facilitated using room phone with L hand to order dinner, with encouragement for pt to take deep breaths and speak louder to improve clarify of speech. Pt able to hold phone to ear and was able to successfully order 1 item, but ultimately pt required assist from OT for ordering remaining items for meal. OT confirmed diet with RN prior to call with dietary. Pt continues to benefit from skilled OT services to address noted impairments and resulting functional deficits in order to maximize return to PLOF and minimize risk of further functional decline and increased caregiver burden.    Follow Up Recommendations  SNF    Equipment Recommendations       Recommendations for Other Services      Precautions / Restrictions Precautions Precautions: Fall Restrictions Weight Bearing Restrictions: No       Mobility Bed Mobility Overal bed mobility: Needs Assistance             General bed mobility  comments: max assist x1 for repositioning in bed for pressure relief with pt noting improved comfort with new position  Transfers                      Balance                                           ADL either performed or assessed with clinical judgement   ADL Overall ADL's : Needs assistance/impaired                                             Vision Baseline Vision/History: Wears glasses Wears Glasses: At all times Patient Visual Report: No change from baseline Vision Assessment?: No apparent visual deficits   Perception     Praxis      Cognition Arousal/Alertness: Awake/alert Behavior During Therapy: WFL for tasks assessed/performed Overall Cognitive Status: Within Functional Limits for tasks assessed                                          Exercises Other Exercises Other Exercises: Facilitated using room phone with L hand  to order dinner, with encouragement for pt to take deep breaths and speak louder to improve clarify of speech, ultimately pt required assist from OT for ordering. OT confirmed diet with RN.   Shoulder Instructions       General Comments RUE positioned with pillows, R hand noted with mild edema this date    Pertinent Vitals/ Pain       Pain Assessment: Faces Faces Pain Scale: Hurts a little bit Pain Location: RLE and RUE "uncomfortable" Pain Descriptors / Indicators: Discomfort Pain Intervention(s): Monitored during session;Limited activity within patient's tolerance;Repositioned  Home Living                                          Prior Functioning/Environment              Frequency  Min 2X/week        Progress Toward Goals  OT Goals(current goals can now be found in the care plan section)  Progress towards OT goals: OT to reassess next treatment  Acute Rehab OT Goals Patient Stated Goal: to get better and return home OT Goal Formulation:  With patient Time For Goal Achievement: 11/09/17 Potential to Achieve Goals: Fair  Plan Discharge plan remains appropriate;Frequency remains appropriate    Co-evaluation                 AM-PAC PT "6 Clicks" Daily Activity     Outcome Measure   Help from another person eating meals?: A Little Help from another person taking care of personal grooming?: A Little Help from another person toileting, which includes using toliet, bedpan, or urinal?: A Lot Help from another person bathing (including washing, rinsing, drying)?: A Lot Help from another person to put on and taking off regular upper body clothing?: A Lot Help from another person to put on and taking off regular lower body clothing?: A Lot 6 Click Score: 14    End of Session Equipment Utilized During Treatment: Oxygen  OT Visit Diagnosis: Other abnormalities of gait and mobility (R26.89);History of falling (Z91.81);Muscle weakness (generalized) (M62.81);Hemiplegia and hemiparesis Hemiplegia - Right/Left: Right Hemiplegia - dominant/non-dominant: Dominant Hemiplegia - caused by: Cerebral infarction   Activity Tolerance Patient tolerated treatment well   Patient Left in bed;with call bell/phone within reach;with bed alarm set   Nurse Communication          Time: 3536-1443 OT Time Calculation (min): 34 min  Charges: OT General Charges $OT Visit: 1 Visit OT Treatments $Self Care/Home Management : 23-37 mins  Richrd Prime, MPH, MS, OTR/L ascom [phone] 10/31/17, 4:25 PM

## 2017-10-31 NOTE — Plan of Care (Signed)
  Problem: Education: Goal: Knowledge of General Education information will improve Outcome: Progressing   Problem: Clinical Measurements: Goal: Will remain free from infection Outcome: Progressing Goal: Diagnostic test results will improve Outcome: Progressing Goal: Respiratory complications will improve Outcome: Progressing Note:  02 3l  sats good  at present time Goal: Cardiovascular complication will be avoided Outcome: Progressing   Problem: Elimination: Goal: Will not experience complications related to urinary retention Outcome: Progressing   Problem: Pain Managment: Goal: General experience of comfort will improve Outcome: Progressing   Problem: Safety: Goal: Ability to remain free from injury will improve Outcome: Progressing

## 2017-10-31 NOTE — Progress Notes (Signed)
Physical Therapy Treatment Patient Details Name: Joanna Roberts MRN: [id number] DOB: Aug 04, 1928 Today's Date: 10/31/2017    History of Present Illness Sanvika Cuttino is an 84yo female who comes to [facility] on 7/3 after sudden insidious RLE paralysis. Head CT negative, but pt is being worked up for TIA v CVA at this time. Pt is pending doppler to r/o DVT hence exam is limited on this date. Since admission, pt has had progressive loss of RUE as well and nwo speech changes as of 7/5. PTA pt lived at [facility] AMB with rollator, independent in ADL.     PT Comments    Pt agreeable to PT; reports RLE pain/stiffness with movement. Pt wishes up in bed. RUE/LE flaccid requiring passive range of motion. Active assisted range of motion on L. Pt requires Max A to sit to the L and R demonstrating some effort with L body to assist.. Poor sitting balance with Min guard to Min A statically and Min A/Mod A with LE movement/wt shifting. After sitting edge of bed, pt wishes up in recliner; cleared with nursing. Total A x 2 for bed to chair transfer. Pt in chair with family present and nursing aware. Continue PT to progress strength/endurance to improve ability to assist with functional mobility.   Follow Up Recommendations  SNF;Supervision/Assistance - 24 hour     Equipment Recommendations  Other (comment)(TBD at next venue)    Recommendations for Other Services       Precautions / Restrictions Precautions Precautions: Fall Restrictions Weight Bearing Restrictions: No    Mobility  Bed Mobility Overal bed mobility: Needs Assistance Bed Mobility: Supine to Sit;Sit to Supine     Supine to sit: Max assist Sit to supine: Total assist   General bed mobility comments: Gives effort for hip scooting in supine toward edge of bed using L (R flaccid). Helps to push/pull up with LUE. Mod posterior lean in sit needing assist to correct. Again attempts sitting support with LUE, but weak  Transfers Overall transfer  level: Needs assistance Equipment used: None Transfers: Stand Pivot Transfers   Stand pivot transfers: Total assist;+2 physical assistance       General transfer comment: Performed 2x; once to L and once to R. Pt wished up in recliner; cleared with nursing. Tolerated well with chair against bed. Caution used to support R flaccid RUE   Ambulation/Gait             General Gait Details: Investment banker, corporate Rank[employer] (Stroke Patients Only)       Balance Overall balance assessment: Needs assistance Sitting-balance support: Feet supported;Single extremity supported Sitting balance-Leahy Scale: Poor Sitting balance - Comments: Can sit for up to 10 seconds unsupported; leans/falls posteriorly with inability to correct. Can sit with Min A with LE work and Min guard statically     Standing balance-Leahy Scale: Zero                              Cognition Arousal/Alertness: Awake/alert Behavior During Therapy: WFL for tasks assessed/performed Overall Cognitive Status: Within Functional Limits for tasks assessed                                        Exercises General Exercises -  Lower Extremity Ankle Circles/Pumps: AROM;Left;10 reps;Supine(PROM R) Quad Sets: Strengthening;Left;10 reps;Supine(poor QS) Gluteal Sets: Strengthening;Left;10 reps;Supine(poor GS) Long Arc Quad: AROM;Left;10 reps;Seated(PROM R) Heel Slides: AAROM;Left;10 reps;Supine(PROM R) Hip ABduction/ADduction: AAROM;Left;10 reps;Supine(PROM R) Straight Leg Raises: AAROM;Left;10 reps;Supine Other Exercises Other Exercises: Sitting edge of bed balance static, with LE movement, R/L weight shifting    General Comments        Pertinent Vitals/Pain Pain Assessment: Faces Faces Pain Scale: Hurts little more Pain Location: RLE with movement Pain Descriptors / Indicators: Grimacing Pain Intervention(s): Monitored during session    Home  Living                      Prior Function            PT Goals (current goals can now be found in the care plan section) Progress towards PT goals: Progressing toward goals    Frequency    7X/week      PT Plan Current plan remains appropriate    Co-evaluation              AM-PAC PT "6 Clicks" Daily Activity  Outcome Measure  Difficulty turning over in bed (including adjusting bedclothes, sheets and blankets)?: Unable Difficulty moving from lying on back to sitting on the side of the bed? : Unable Difficulty sitting down on and standing up from a chair with arms (e.g., wheelchair, bedside commode, etc,.)?: Unable Help needed moving to and from a bed to chair (including a wheelchair)?: Total Help needed walking in hospital room?: Total Help needed climbing 3-5 steps with a railing? : Total 6 Click Score: 6    End of Session Equipment Utilized During Treatment: Oxygen Activity Tolerance: Patient tolerated treatment well Patient left: in chair;with call bell/phone within reach;Other (comment);with family/visitor present(nursing aware without alarm. Family will be present all time)   PT Visit Diagnosis: Muscle weakness (generalized) (M62.81);Difficulty in walking, not elsewhere classified (R26.2);Hemiplegia and hemiparesis Hemiplegia - Right/Left: Right Hemiplegia - dominant/non-dominant: Dominant Hemiplegia - caused by: Unspecified     Time: 0539-7673 PT Time Calculation (min) (ACUTE ONLY): 39 min  Charges:  $Therapeutic Exercise: 8-22 mins $Therapeutic Activity: 23-37 mins                    G Codes:        Scot Dock, PTA 10/31/2017, 11:44 AM

## 2017-10-31 NOTE — Clinical Social Work Note (Signed)
CSW received a phone call from speech therapist, who said patient's HCPOA was in the room and wanted an update on insurance authorization and bed placement.  CSW contacted patient's HCPOA via phone, and confirmed that patient has bed at [facility] [place] and insurance has been approved.  CSW to continue to facilitate discharge planning.  Ervin Knack. Joanna Roberts, MSW, Theresia Majors [phone]  10/31/2017 6:03 PM

## 2017-10-31 NOTE — Progress Notes (Signed)
[facility] - [place] at [facility]   PATIENT NAME: Raul Winterhalter    MR#:  [id number]  DATE OF BIRTH:  July 02, 1928  Has right-sided weakness, flaccidity, found to have acute left frontal lobe infarct.  Patient is completely flaccid on the right side, has some garbled speech but eats more clear today.  Seen by speech therapy patient had MBS and found to have junk as diverticulum.  Started back on dysphagia diet.  With patient's daughter and also healthcare power of attorney yesterday.patient says that she can move on the right side.  No shortness of breath or chest pain.  CHIEF COMPLAINT:   Chief Complaint  Patient presents with  . Extremity Weakness    REVIEW OF SYSTEMS:   ROS  Patient is alert awake oriented .  Speech is more clear today.  CONSTITUTIONAL: No fever, fatigue   Does have dysarthria, right-sided weakness EYES: No blurred or double vision.  EARS, NOSE, AND THROAT: No tinnitus or ear pain.  RESPIRATORY: No shortness of breath today cARDIOVASCULAR: No chest pain, orthopnea, edema.  GASTROINTESTINAL: No nausea, vomiting, diarrhea or abdominal pain.  GENITOURINARY: No dysuria, hematuria.  ENDOCRINE: No polyuria, nocturia,  HEMATOLOGY: No anemia, easy bruising or bleeding SKIN: No rash or lesion. MUSCULOSKELETAL: No joint pain or arthritis.   NEUROLOGIC: Right-sided weakness, garbled speech  PSYCHIATRY: she is anxious  DRUG ALLERGIES:   Allergies  Allergen Reactions  . Lortab [Hydrocodone-Acetaminophen] Itching, Swelling and Rash  . Doxycycline Diarrhea  . Macrobid [Nitrofurantoin Monohyd Macro] Diarrhea and Nausea And Vomiting  . Nitrofurantoin Diarrhea, Nausea Only and Nausea And Vomiting    VITALS:  Blood pressure (!) 142/91, pulse 74, temperature 98 F (36.7 C), temperature source Oral, resp. rate 20, height 5\' 4"  (1.626 m), weight 59.5 kg (131 lb 3.2 oz), SpO2 99 %.  PHYSICAL EXAMINATION:  GENERAL:  84 y.o.-year-old patient lying in the  bed with no acute distress.  EYES: Pupils equal, round, reactive to light and accommodation. No scleral icterus. Extraocular muscles intact.  HEENT: Head atraumatic, normocephalic. Oropharynx and nasopharynx clear.  NECK:  Supple, no jugular venous distention. No thyroid enlargement, no tenderness.  LUNGS: Normal breath sounds bilaterally, no wheezing, rales,rhonchi or crepitation. No use of accessory muscles of respiration.  CARDIOVASCULAR: S1, S2 normal. No murmurs, rubs, or gallops.  ABDOMEN: Soft, nontender, nondistended. Bowel sounds present. No organomegaly or mass.  EXTREMITIES: No pedal edema, cyanosis, or clubbing.  NEUROLOGIC: Dysarthria, cranial nerves II through XII intact, right upper and lower extremity power 0 out of 5, left upper, lower extremity 5/5 Patient also complains of numbness on the right side.  PSYCHIATRIC: The patient is alert and oriented x 3.  SKIN: No obvious rash, lesion, or ulcer.    LABORATORY PANEL:   CBC Recent Labs  Lab 10/26/17 1007  WBC 3.7  HGB 10.7*  HCT 33.1*  PLT 125*   ------------------------------------------------------------------------------------------------------------------  Chemistries  Recent Labs  Lab 10/26/17 1007  NA 131*  K 4.3  CL 92*  CO2 33*  GLUCOSE 111*  BUN 26*  CREATININE 0.93  CALCIUM 9.2  AST 23  ALT 16  ALKPHOS 45  BILITOT 1.7*   ------------------------------------------------------------------------------------------------------------------  Cardiac Enzymes Recent Labs  Lab 10/26/17 1007  TROPONINI 0.03*   ------------------------------------------------------------------------------------------------------------------  RADIOLOGY:  No results found.  EKG:   Orders placed or performed during the hospital encounter of 10/26/17  . ED EKG  . ED EKG  . EKG 12-Lead  . EKG 12-Lead  . EKG  12-Lead  . EKG 12-Lead    ASSESSMENT AND PLAN:  84 year old female with multiple medical problems of  chronic systolic heart failure with EF 25 to 30%, chronic atrial fibrillation not on anticoagulation, GERD, hypertension, hypothyroidism, chronic respiratory failure with oxygen 2 L at baseline comes in with [facility] independent living facility because of said weakness of the started yesterday morning and patient is admitted for evaluation of TIA./CVA. 1.  Acute right-sided weakness likely due to embolic stroke patient ; since CT head  showed infarct in the left frontal lobe.  I spoke with healthcare power of attorney and wants to talk to palliative care team, consult placed.  Status post MBS today showed Zenker's diverticulum.  Spoke with speech back on dysphagia 2 diet. Palliative care consult today, likely discharge in next 24 to 48 hours as stroke work-up is finished patient will need skilled nursing facility and likely will follow with palliative care.  Watch how she does with the diet today.   2.  Chronic systolic heart failure with diffuse hypokinesia, EF 25 to 30%: Followed by Dr. Lady Gary from cardiology so cardiology consult is requested, patient has history of severe MR, TR, followed by CHF clinic.  Plan to resume Coreg, diuretic once the patient p.o. status is established.  Right now she is on fluids to help with hydration and prevent hydration   3.  Chronic atrial fibrillation, status post ICD, PPM placement.  Needs to wait for at least 7 to 10 days to start full dose anticoagulation as per neurology recommendation due to acute stroke And admitted on July 3 2 days day 6 of presentation    #4. right leg swelling, I do not see any right leg swelling, but registered nurse concerned about that so we did ultrasound of right leg did not show any DVT.  5.diarrhea secondary to laxative: Improved  Patient is a DNR, DNI, spoke with healthcare power of attorney Thomes Dinning and agree with meeting with tem.  Patient is on Ativan  as needed for anxiety.Marland Kitchen      all the records are reviewed and case  discussed with Care Management/Social Workerr. Management plans discussed with the patient, family and they are in agreement.  CODE STATUS: DNR  TOTAL TIME TAKING CARE OF THIS PATIENT: 40 minutes   POSSIBLE D/C IN 2-3 DAYS, DEPENDING ON CLINICAL CONDITION. More than 50% of the time, spent in counseling, coordination of care, discussing with patient, reviewing the old charts.  Katha Hamming M.D on 10/31/2017 at 11:20 AM  Between 7am to 6pm - Pager - [phone]  After 6pm go to [email or web address] - password [email or web address] [facility]  [facility]  Office  [phone]  CC: Primary care physician; Patrice Paradise, MD   Note: This dictation was prepared with Dragon dictation along with smaller phrase technology. Any transcriptional errors that result from this process are unintentional.

## 2017-10-31 NOTE — Care Management Important Message (Signed)
Important Message  Patient Details  Name: Joanna Roberts MRN: [id number] Date of Birth: 04-16-1929   Medicare Important Message Given:  Yes    Olegario Messier A [name] 10/31/2017, 10:36 AM

## 2017-10-31 NOTE — Plan of Care (Signed)
  Problem: Education: Goal: Knowledge of General Education information will improve Outcome: Progressing   Problem: Health Behavior/Discharge Planning: Goal: Ability to manage health-related needs will improve Outcome: Progressing   Problem: Clinical Measurements: Goal: Ability to maintain clinical measurements within normal limits will improve Outcome: Progressing Goal: Will remain free from infection Outcome: Progressing Goal: Respiratory complications will improve Outcome: Progressing Goal: Cardiovascular complication will be avoided Outcome: Progressing   Problem: Activity: Goal: Risk for activity intolerance will decrease Outcome: Progressing   Problem: Coping: Goal: Level of anxiety will decrease Outcome: Progressing   Problem: Elimination: Goal: Will not experience complications related to bowel motility Outcome: Progressing Goal: Will not experience complications related to urinary retention Outcome: Progressing   Problem: Pain Managment: Goal: General experience of comfort will improve Outcome: Progressing   Problem: Safety: Goal: Ability to remain free from injury will improve Outcome: Progressing   Problem: Skin Integrity: Goal: Risk for impaired skin integrity will decrease Outcome: Progressing   Problem: Clinical Measurements: Goal: Diagnostic test results will improve Outcome: Not Progressing-stroke extended    Problem: Nutrition: Goal: Adequate nutrition will be maintained Outcome: Not Progressing-swallowing evaluation for today

## 2017-10-31 NOTE — Progress Notes (Signed)
[facility] Encounter Note  Patient: SERAFINA TOPHAM / Admit Date: 10/26/2017 / Date of Encounter: 10/31/2017, 6:33 AM   Subjective: Patient is slightly improved overnight.  Patient still does have some right-sided pain but significant continued right-sided weakness and unable to ambulate.  The patient does have severe swallowing difficulties at this time consistent with recent embolic stroke.  Patient has good heart rate control at this time and does not need any further cardiac intervention.  Review of Systems: Positive for: Right-sided weakness and right-sided pain Negative for: Vision change, hearing change, syncope, dizziness, nausea, vomiting,diarrhea, bloody stool, stomach pain, cough, congestion, diaphoresis, urinary frequency, urinary pain,skin lesions, skin rashes Others previously listed  Objective: Telemetry: Paced rhythm Physical Exam: Blood pressure 115/81, pulse 91, temperature 98 F (36.7 C), temperature source Oral, resp. rate 19, height 5\' 4"  (1.626 m), weight 131 lb 3.2 oz (59.5 kg), SpO2 95 %. Body mass index is 22.52 kg/m. General: Well developed, well nourished, in no acute distress. Head: Normocephalic, atraumatic, sclera non-icteric, no xanthomas, nares are without discharge. Neck: No apparent masses Lungs: Normal respirations with no wheezes, no rhonchi, no rales , few crackles   Heart: Regular rate and rhythm, normal S1 S2, 4+ apical and 3+ left sternal border murmur, no rub, no gallop, PMI is normal size and placement, carotid upstroke normal without bruit, jugular venous pressure normal Abdomen: Soft, non-tender, non-distended with normoactive bowel sounds. No hepatosplenomegaly. Abdominal aorta is normal size without bruit Extremities: Trace edema, no clubbing, no cyanosis, no ulcers,  Peripheral: 2+ radial, 2+ femoral, 2+ dorsal pedal pulses Neuro: Alert and oriented. Moves all extremities spontaneously. Psych:  Responds to questions  appropriately with a normal affect.   Intake/Output Summary (Last 24 hours) at 10/31/2017 0633 Last data filed at 10/31/2017 0300 Gross per 24 hour  Intake 709.35 ml  Output -  Net 709.35 ml    Inpatient Medications:  . aspirin  300 mg Rectal Daily  . enoxaparin (LOVENOX) injection  40 mg Subcutaneous Q24H  . feeding supplement (ENSURE ENLIVE)  237 mL Oral Q24H  . scopolamine  1 patch Transdermal Q72H  . temazepam  7.5 mg Oral QHS   Infusions:  . dextrose 5 % and 0.45% NaCl 50 mL/hr at 10/30/17 1248    Labs: No results for input(s): NA, K, CL, CO2, GLUCOSE, BUN, CREATININE, CALCIUM, MG, PHOS in the last 72 hours. No results for input(s): AST, ALT, ALKPHOS, BILITOT, PROT, ALBUMIN in the last 72 hours. No results for input(s): WBC, NEUTROABS, HGB, HCT, MCV, PLT in the last 72 hours. No results for input(s): CKTOTAL, CKMB, TROPONINI in the last 72 hours. Invalid input(s): POCBNP No results for input(s): HGBA1C in the last 72 hours.   Weights: Filed Weights   10/26/17 1002 10/26/17 1536  Weight: 133 lb 2.5 oz (60.4 kg) 131 lb 3.2 oz (59.5 kg)     Radiology/Studies:  Ct Head Wo Contrast  Result Date: 10/28/2017 CLINICAL DATA:  Slurred speech with weakness on left side, new. Persistent right-sided weakness EXAM: CT HEAD WITHOUT CONTRAST TECHNIQUE: Contiguous axial images were obtained from the base of the skull through the vertex without intravenous contrast. COMPARISON:  October 27, 2017 FINDINGS: Brain: There is mild to moderate diffuse atrophy. There is no intracranial mass, hemorrhage, extra-axial fluid collection, or midline shift. There is an acute appearing infarct arising in the medial superior left frontal lobe adjacent to the falx. This infarct appears to involve the superior most aspect of the white matter  of the centrum semiovale on the left. There is a minimal area of extension to the right of the falx in this area seen on axial slice 24 series 2. No other acute appearing  infarct is evident. There is patchy small vessel disease in the centra semiovale bilaterally. Vascular: No hyperdense vessel evident. There is calcification in each carotid siphon and distal vertebral artery. Skull: Bony calvarium appears intact. Sinuses/Orbits: There is mucosal thickening in several ethmoid air cells bilaterally. Other visualized paranasal sinuses are clear. Orbits appear symmetric bilaterally. There is evidence of cataract removals bilaterally. Other: Bony calvarium appears intact. There is debris in the left external auditory canal. IMPRESSION: Acute infarct in the anterior superior left frontal lobe with minimal extension to the right of the falx in the medial anterior right frontal lobe. This finding is best seen on axial slice 24 series 2. No other acute infarct evident. There is atrophy with patchy periventricular small vessel disease. No mass or hemorrhage evident. Foci of arterial vascular calcification noted. Mucosal thickening noted in several ethmoid air cells. These results were called by telephone at the time of interpretation on 10/28/2017 at 11:25 am to Dr. Katha Hamming , who verbally acknowledged these results. Electronically Signed   By: Bretta Bang III M.D.   On: 10/28/2017 11:25   Ct Head Wo Contrast  Result Date: 10/27/2017 CLINICAL DATA:  Persistent right-sided weakness EXAM: CT HEAD WITHOUT CONTRAST TECHNIQUE: Contiguous axial images were obtained from the base of the skull through the vertex without intravenous contrast. COMPARISON:  10/26/2017 FINDINGS: Brain: Chronic atrophic and ischemic changes without acute abnormality. No findings to suggest acute hemorrhage, acute infarction or space-occupying mass lesion are noted. Vascular: No hyperdense vessel or unexpected calcification. Skull: Normal. Negative for fracture or focal lesion. Sinuses/Orbits: Stable mucosal thickening in the sphenoid sinus. Other: Previously seen venous air has resolved in the interval  from the prior exam. IMPRESSION: Chronic atrophic and ischemic changes without acute abnormality. Electronically Signed   By: Alcide Clever M.D.   On: 10/27/2017 11:36   Ct Head Wo Contrast  Result Date: 10/26/2017 CLINICAL DATA:  Right-sided weakness EXAM: CT HEAD WITHOUT CONTRAST TECHNIQUE: Contiguous axial images were obtained from the base of the skull through the vertex without intravenous contrast. COMPARISON:  09/29/2015 FINDINGS: Brain: Chronic atrophic and ischemic changes are identified and stable. Prior left lacunar infarct in the basal ganglia is noted. No findings to suggest acute hemorrhage, acute infarction or space-occupying mass lesion are noted. Vascular: No hyperdense vessel or unexpected calcification. Skull: Normal. Negative for fracture or focal lesion. Sinuses/Orbits: Mucosal changes are noted within the sphenoid sinus. Other: Some air is noted in the subcutaneous tissues of the face bilaterally likely related to retrograde filling from inadvertent air within an IV line. IMPRESSION: Chronic changes without acute intracranial abnormality. Mild sphenoid sinus disease. Electronically Signed   By: Alcide Clever M.D.   On: 10/26/2017 10:36   US Carotid Bilateral  Result Date: 10/27/2017 CLINICAL DATA:  84 year old female with a history of TIA. Cardiovascular risk factors include known stroke/TIA, known coronary artery disease EXAM: BILATERAL CAROTID DUPLEX ULTRASOUND TECHNIQUE: Wallace Cullens scale imaging, color Doppler and duplex ultrasound were performed of bilateral carotid and vertebral arteries in the neck. COMPARISON:  None. FINDINGS: Criteria: Quantification of carotid stenosis is based on velocity parameters that correlate the residual internal carotid diameter with NASCET-based stenosis levels, using the diameter of the distal internal carotid lumen as the denominator for stenosis measurement. The following velocity measurements were obtained:  RIGHT ICA:  Systolic 37 cm/sec, Diastolic 6 cm/sec  CCA:  37 cm/sec SYSTOLIC ICA/CCA RATIO:  1.0 ECA:  37 cm/sec LEFT ICA:  Systolic 106 cm/sec, Diastolic 38 cm/sec CCA:  50 cm/sec SYSTOLIC ICA/CCA RATIO:  2.1 ECA:  72 cm/sec Right Brachial SBP: Not acquired Left Brachial SBP: Not acquired RIGHT CAROTID ARTERY: No significant calcifications of the right common carotid artery. Intermediate waveform maintained. Heterogeneous and partially calcified plaque at the right carotid bifurcation. No significant lumen shadowing. Low resistance waveform of the right ICA, with parvus tardus configuration. No significant tortuosity. RIGHT VERTEBRAL ARTERY: Antegrade flow with low resistance waveform, with parvus tardus configuration. LEFT CAROTID ARTERY: No significant calcifications of the left common carotid artery. Intermediate waveform maintained. Heterogeneous and partially calcified plaque at the left carotid bifurcation without significant lumen shadowing. Low resistance waveform of the left ICA, with parvus tardus configuration. No significant tortuosity. LEFT VERTEBRAL ARTERY: Antegrade flow with low resistance waveform, with parvus tardus configuration. IMPRESSION: Color duplex indicates minimal heterogeneous and calcified plaque, with no hemodynamically significant stenosis by duplex criteria in the extracranial cerebrovascular circulation. The parvus tardus configuration throughout the cerebral vessels may reflect aortic stenosis. Correlation with ECHO recommended, if not already performed. Signed, Yvone Neu. Reyne Dumas, RPVI [facility] Electronically Signed   By: Gilmer Mor D.O.   On: 10/27/2017 08:05   US Venous Img Lower Bilateral  Result Date: 10/28/2017 CLINICAL DATA:  84 year old female with bilateral lower extremity leg swelling and skin discoloration EXAM: BILATERAL LOWER EXTREMITY VENOUS DOPPLER ULTRASOUND TECHNIQUE: Gray-scale sonography with graded compression, as well as color Doppler and  duplex ultrasound were performed to evaluate the lower extremity deep venous systems from the level of the common femoral vein and including the common femoral, femoral, profunda femoral, popliteal and calf veins including the posterior tibial, peroneal and gastrocnemius veins when visible. The superficial great saphenous vein was also interrogated. Spectral Doppler was utilized to evaluate flow at rest and with distal augmentation maneuvers in the common femoral, femoral and popliteal veins. COMPARISON:  None. FINDINGS: RIGHT LOWER EXTREMITY Common Femoral Vein: No evidence of thrombus. Normal compressibility, respiratory phasicity and response to augmentation. Increased pulsatility of the venous waveforms. Saphenofemoral Junction: No evidence of thrombus. Normal compressibility and flow on color Doppler imaging. Profunda Femoral Vein: No evidence of thrombus. Normal compressibility and flow on color Doppler imaging. Femoral Vein: No evidence of thrombus. Normal compressibility, respiratory phasicity and response to augmentation. Popliteal Vein: No evidence of thrombus. Normal compressibility, respiratory phasicity and response to augmentation. Calf Veins: No evidence of thrombus. Normal compressibility and flow on color Doppler imaging. Superficial Great Saphenous Vein: No evidence of thrombus. Normal compressibility. Venous Reflux:  None. Other Findings:  None. LEFT LOWER EXTREMITY Common Femoral Vein: No evidence of thrombus. Normal compressibility, respiratory phasicity and response to augmentation. Increased pulsatility of the venous waveforms. Saphenofemoral Junction: No evidence of thrombus. Normal compressibility and flow on color Doppler imaging. Profunda Femoral Vein: No evidence of thrombus. Normal compressibility and flow on color Doppler imaging. Femoral Vein: No evidence of thrombus. Normal compressibility, respiratory phasicity and response to augmentation. Popliteal Vein: No evidence of thrombus.  Normal compressibility, respiratory phasicity and response to augmentation. Calf Veins: No evidence of thrombus. Normal compressibility and flow on color Doppler imaging. Superficial Great Saphenous Vein: No evidence of thrombus. Normal compressibility. Venous Reflux:  None. Other Findings:  None. IMPRESSION: 1. No evidence of deep venous thrombosis in either lower extremity. 2. Increased pulsatility of  the venous waveforms bilaterally consistent with elevated right heart pressures. Differential considerations include tricuspid regurgitation, right heart failure, pulmonary hypertension and long-standing COPD. Electronically Signed   By: Malachy Moan M.D.   On: 10/28/2017 15:55   Dg Femur Min 2 Views Right  Result Date: 10/26/2017 CLINICAL DATA:  Right lower extremity pain and weakness, no reported injury EXAM: RIGHT FEMUR 2 VIEWS COMPARISON:  None. FINDINGS: There is no evidence of fracture or other focal bone lesions. Small superior right patellar enthesophyte. Vascular calcifications in the soft tissues. Surgical clip in soft tissues medial to the mid right femur. IMPRESSION: No fracture. Electronically Signed   By: Delbert Phenix M.D.   On: 10/26/2017 10:39     Assessment and Recommendation  84 y.o. female with known chronic nonvalvular atrial fibrillation with sick sinus syndrome status post pacemaker placement and LV systolic dysfunction with ejection fraction of 25 to 30% having acute embolic stroke with severe right-sided weakness and disability with now significant swallowing difficulty eating further treatment 1.  Aspirin for embolic stroke and no use of anticoagulation for apparently 1 to 2 weeks as per neurology 2.  Full anticoagulation after 2 weeks for further risk reduction of recurrent stroke if able due to chronic nonvalvular atrial fibrillation 3.  Swallowing study to assess for treatment of significant stroke 4.  Reinstatement of medication management for LV systolic dysfunction  including carvedilol and ACE inhibitor as able after further treatment with swallowing study 5.  No further cardiac diagnostics necessary at this time  Signed, Arnoldo Hooker M.D. FACC

## 2017-10-31 NOTE — Consult Note (Signed)
Consultation Note Date: 10/31/2017   Patient Name: Joanna Roberts  DOB: [birth year]  MRN: [id number]  Age / Sex: 84 y.o., female  PCP: Patrice Paradise, MD Referring Physician: Katha Hamming, MD  Reason for Consultation: Establishing goals of care and Psychosocial/spiritual support  HPI/Patient Profile: 84 y.o. female  admitted on 10/26/2017 with PMH of  hypertension, GERD, recent CVA, LV dysfunction,  arthritis, comes from   independent living facility secondary to right leg weakness   Patient lives at independent facility and she is very independent but started to have sudden onset of right leg weakness unable to ambulate.  No weakness of hands, left side.  No speech troubles.  No blurred vision.  Denies any recent illness.  Because of weakness on the right leg unable to walk.  No neurological symptoms noted.  Patient has had slow, continued physical and functional decline over the past several months.  Patient and family face decisions related to treatment option decisions, advanced directives and anticipatory care needs.   Clinical Assessment and Goals of Care:   This NP Lorinda Creed reviewed medical records, received report from team, assessed the patient and then meet at the patient's bedside along with an son-in-law and then by telephone with cousin/ Onalee Hua Scott/HPOA to discuss diagnosis, prognosis, GOC, EOL wishes disposition and options.  Concept of  Palliative Care were discussed  A detailed discussion was had today regarding advanced directives.  Concepts specific to code status, artifical feeding and hydration, continued IV antibiotics and rehospitalization was had.  The difference between a aggressive medical intervention path  and a palliative comfort care path for this patient at this time was had.  Values and goals of care important to patient and family were attempted to be  elicited.  Discussed concept of mortality and failure to thrive;  and the importance of continued conversation with family and their  medical providers regarding overall plan of care and treatment options,  ensuring decisions are within the context of the patients values and GOCs.  Questions and concerns addressed.   Family encouraged to call with questions or concerns.    PMT will continue to support holistically.  HCPOA    SUMMARY OF RECOMMENDATIONS    Code Status/Advance Care Planning:  DNR   Palliative Prophylaxis:   Aspiration, Bowel Regimen, Delirium Protocol, Frequent Pain Assessment and Oral Care  Additional Recommendations (Limitations, Scope, Preferences):  Full Scope Treatment always with comfort as a priority  Psycho-social/Spiritual:   Desire for further Chaplaincy support: declined  Prognosis:   Unable to determine  Discharge Planning: Skilled Nursing Facility for rehab with Palliative care service follow-up      Primary Diagnoses: Present on Admission: . Left leg weakness   I have reviewed the medical record, interviewed the patient and family, and examined the patient. The following aspects are pertinent.  Past Medical History:  Diagnosis Date  . AICD (automatic cardioverter/defibrillator) present   . Allergy   . Anemia   . Arthritis   . Cataract   . CHF (  congestive heart failure) (HCC)   . Depression   . Dysrhythmia   . GERD (gastroesophageal reflux disease)   . Heart murmur   . History of TIAs   . Hypertension   . Hypothyroidism   . Myocardial infarction (HCC)   . Osteoporosis   . Presence of permanent cardiac pacemaker   . Thyroid disease    Social History   Socioeconomic History  . Marital status: Widowed    Spouse name: Not on file  . Number of children: Not on file  . Years of education: Not on file  . Highest education level: Not on file  Occupational History  . Not on file  Social Needs  . Financial resource strain:  Not on file  . Food insecurity:    Worry: Not on file    Inability: Not on file  . Transportation needs:    Medical: Not on file    Non-medical: Not on file  Tobacco Use  . Smoking status: Never Smoker  . Smokeless tobacco: Never Used  Substance and Sexual Activity  . Alcohol use: No  . Drug use: No  . Sexual activity: Not Currently    Birth control/protection: Post-menopausal  Lifestyle  . Physical activity:    Days per week: Not on file    Minutes per session: Not on file  . Stress: Not on file  Relationships  . Social connections:    Talks on phone: Not on file    Gets together: Not on file    Attends religious service: Not on file    Active member of club or organization: Not on file    Attends meetings of clubs or organizations: Not on file    Relationship status: Not on file  Other Topics Concern  . Not on file  Social History Narrative  . Not on file   Family History  Problem Relation Age of Onset  . Early death Mother   . Heart disease Mother   . Arthritis Father   . Asthma Father   . Stroke Sister    Scheduled Meds: . aspirin  300 mg Rectal Daily  . enoxaparin (LOVENOX) injection  40 mg Subcutaneous Q24H  . feeding supplement (ENSURE ENLIVE)  237 mL Oral Q24H  . scopolamine  1 patch Transdermal Q72H  . temazepam  7.5 mg Oral QHS   Continuous Infusions: PRN Meds:.acetaminophen, levalbuterol, LORazepam, ondansetron (ZOFRAN) IV Medications Prior to Admission:  Prior to Admission medications   Medication Sig Start Date End Date Taking? Authorizing Provider  carvedilol (COREG) 6.25 MG tablet Take 1 tablet (6.25 mg total) by mouth 2 (two) times daily. 09/26/17  Yes Mody, Patricia Pesa, MD  docusate sodium (COLACE) 100 MG capsule Take 100 mg by mouth 2 (two) times daily.   Yes [provider]  furosemide (LASIX) 20 MG tablet Take 2 tablets (40 mg total) by mouth daily. 09/26/17  Yes Mody, Patricia Pesa, MD  gabapentin (NEURONTIN) 300 MG capsule Take 1 capsule (300 mg  total) by mouth 2 (two) times daily. 09/26/17  Yes Mody, Sital, MD  levothyroxine (SYNTHROID, LEVOTHROID) 25 MCG tablet TAKE 1 TABLET EVERY DAY ON EMPTY STOMACHWITH A GLASS OF WATER AT LEAST 30-60 MINBEFORE BREAKFAST 11/29/16  Yes [provider]  temazepam (RESTORIL) 7.5 MG capsule Take 7.5 mg by mouth at bedtime.  10/05/17  Yes [provider]  vitamin B-12 (CYANOCOBALAMIN) 1000 MCG tablet Take 1,000 mcg by mouth daily.   Yes [provider]  acetaminophen (TYLENOL) 500 MG tablet  Take 1,000 mg by mouth every 8 (eight) hours as needed for mild pain or headache.    [provider]  levalbuterol Pauline Aus HFA) 45 MCG/ACT inhaler Inhale 1-2 puffs into the lungs every 6 (six) hours as needed for wheezing or shortness of breath.  09/05/17 09/05/18  [provider]  nitroGLYCERIN (NITROSTAT) 0.4 MG SL tablet Place 0.4 mg under the tongue every 5 (five) minutes as needed for chest pain.  09/22/16   [provider]  triamcinolone cream (KENALOG) 0.1 % Apply 1 application topically 2 (two) times daily as needed (for rash/skin irritation.).  04/28/17 04/28/18  [provider]   Allergies  Allergen Reactions  . Lortab [Hydrocodone-Acetaminophen] Itching, Swelling and Rash  . Doxycycline Diarrhea  . Macrobid [Nitrofurantoin Monohyd Macro] Diarrhea and Nausea And Vomiting  . Nitrofurantoin Diarrhea, Nausea Only and Nausea And Vomiting   Review of Systems  Constitutional: Positive for fatigue.  Neurological: Positive for weakness.    Physical Exam  Constitutional: She appears cachectic. She appears ill.  Cardiovascular: Normal rate, regular rhythm and normal heart sounds.  Pulmonary/Chest: She has decreased breath sounds.  Musculoskeletal:  - generalized weakness  Neurological: She is alert.    Vital Signs: BP 106/87   Pulse 84   Temp 98 F (36.7 C) (Oral)   Resp 20   Ht 5\' 4"  (1.626 m)   Wt 59.5 kg (131 lb 3.2 oz)   SpO2 97%   BMI 22.52  kg/m  Pain Scale: 0-10 POSS *See Group Information*: S-Acceptable,Sleep, easy to arouse Pain Score: 2 (at intevals rt side)   SpO2: SpO2: 97 % O2 Device:SpO2: 97 % O2 Flow Rate: .O2 Flow Rate (L/min): 3 L/min  IO: Intake/output summary:   Intake/Output Summary (Last 24 hours) at 10/31/2017 1236 Last data filed at 10/31/2017 0615 Gross per 24 hour  Intake 709.35 ml  Output 500 ml  Net 209.35 ml    LBM: Last BM Date: 10/30/17 Baseline Weight: Weight: 60.4 kg (133 lb 2.5 oz) Most recent weight: Weight: 59.5 kg (131 lb 3.2 oz)     Palliative Assessment/Data: 30 % at best     Time In: 1100 Time Out: 1215 Time Total: 75  minutes Greater than 50%  of this time was spent counseling and coordinating care related to the above assessment and plan.  Signed by: Lorinda Creed, NP   Please contact Palliative Medicine Team phone at [phone] for questions and concerns.  For individual provider: See Loretha Stapler

## 2017-11-01 DIAGNOSIS — R627 Adult failure to thrive: Secondary | ICD-10-CM

## 2017-11-01 DIAGNOSIS — R531 Weakness: Secondary | ICD-10-CM

## 2017-11-01 DIAGNOSIS — Z66 Do not resuscitate: Secondary | ICD-10-CM

## 2017-11-01 DIAGNOSIS — Z515 Encounter for palliative care: Secondary | ICD-10-CM

## 2017-11-01 LAB — CBC
HEMATOCRIT: 34 % — AB (ref 35.0–47.0)
Hemoglobin: 11.1 g/dL — ABNORMAL LOW (ref 12.0–16.0)
MCH: 32.1 pg (ref 26.0–34.0)
MCHC: 32.5 g/dL (ref 32.0–36.0)
MCV: 98.8 fL (ref 80.0–100.0)
Platelets: 129 10*3/uL — ABNORMAL LOW (ref 150–440)
RBC: 3.44 MIL/uL — ABNORMAL LOW (ref 3.80–5.20)
RDW: 16.3 % — AB (ref 11.5–14.5)
WBC: 4.3 10*3/uL (ref 3.6–11.0)

## 2017-11-01 LAB — CREATININE, SERUM
Creatinine, Ser: 0.66 mg/dL (ref 0.44–1.00)
GFR calc Af Amer: >60 mL/min (ref >60)
GFR calc non Af Amer: >60 mL/min (ref >60)

## 2017-11-01 MED ORDER — ADULT MULTIVITAMIN W/MINERALS CH
1.0000 | ORAL_TABLET | Freq: Every day | ORAL | Status: DC
  Administered 2017-11-02: 1 via ORAL
  Filled 2017-11-01: qty 1

## 2017-11-01 MED ORDER — VITAMIN C 500 MG PO TABS
250.0000 mg | ORAL_TABLET | Freq: Two times a day (BID) | ORAL | Status: DC
Start: 2017-11-01 — End: 2017-11-02
  Administered 2017-11-02 (×2): 250 mg via ORAL
  Filled 2017-11-01 (×4): qty 0.5

## 2017-11-01 MED ORDER — CARVEDILOL 3.125 MG PO TABS
6.2500 mg | ORAL_TABLET | Freq: Two times a day (BID) | ORAL | Status: DC
  Administered 2017-11-01 – 2017-11-02 (×2): 6.25 mg via ORAL
  Filled 2017-11-01 (×2): qty 2

## 2017-11-01 MED ORDER — SALINE SPRAY 0.65 % NA SOLN
1.0000 | NASAL | Status: DC | PRN
  Administered 2017-11-01: 1 via NASAL
  Filled 2017-11-01: qty 44

## 2017-11-01 MED ORDER — LEVOTHYROXINE SODIUM 25 MCG PO TABS
25.0000 ug | ORAL_TABLET | Freq: Every day | ORAL | Status: DC
  Administered 2017-11-01 – 2017-11-02 (×2): 25 ug via ORAL
  Filled 2017-11-01 (×2): qty 1

## 2017-11-01 MED ORDER — OCUVITE-LUTEIN PO CAPS
1.0000 | ORAL_CAPSULE | Freq: Every day | ORAL | Status: DC
  Administered 2017-11-02: 1 via ORAL
  Filled 2017-11-01: qty 1

## 2017-11-01 MED ORDER — ATORVASTATIN CALCIUM 20 MG PO TABS
40.0000 mg | ORAL_TABLET | Freq: Every day | ORAL | Status: DC
Start: 2017-11-01 — End: 2017-11-02
  Administered 2017-11-01: 40 mg via ORAL
  Filled 2017-11-01: qty 2

## 2017-11-01 NOTE — Progress Notes (Signed)
[facility] - [place] at [facility]   PATIENT NAME: Joanna Roberts    MR#:  [id number]  DATE OF BIRTH:  1928/06/26  has right-sided weakness. No weakness on the left side. Patient complains of back pain.   CHIEF COMPLAINT:   Chief Complaint  Patient presents with  . Extremity Weakness   patient sat in the chair yesterday and also ate.  REVIEW OF SYSTEMS:   ROS  Patient is alert awake oriented .  Speech is  clear   CONSTITUTIONAL: No fever, fatigue   Does have dysarthria, right-sided weakness EYES: No blurred or double vision.  EARS, NOSE, AND THROAT: No tinnitus or ear pain.  RESPIRATORY: No shortness of breath today cARDIOVASCULAR: No chest pain, orthopnea, edema.  GASTROINTESTINAL: No nausea, vomiting, diarrhea or abdominal pain.  GENITOURINARY: No dysuria, hematuria.  ENDOCRINE: No polyuria, nocturia,  HEMATOLOGY: No anemia, easy bruising or bleeding SKIN: No rash or lesion. MUSCULOSKELETAL: No joint pain or arthritis.   NEUROLOGIC: Right-sided weakness, garbled speech  PSYCHIATRY: she is anxious  DRUG ALLERGIES:   Allergies  Allergen Reactions  . Lortab [Hydrocodone-Acetaminophen] Itching, Swelling and Rash  . Doxycycline Diarrhea  . Macrobid [Nitrofurantoin Monohyd Macro] Diarrhea and Nausea And Vomiting  . Nitrofurantoin Diarrhea, Nausea Only and Nausea And Vomiting    VITALS:  Blood pressure 107/85, pulse 83, temperature 97.8 F (36.6 C), temperature source Oral, resp. rate 18, height 5\' 4"  (1.626 m), weight 59.5 kg (131 lb 3.2 oz), SpO2 96 %.  PHYSICAL EXAMINATION:  GENERAL:  84 y.o.-year-old patient lying in the bed with no acute distress.  EYES: Pupils equal, round, reactive to light and accommodation. No scleral icterus. Extraocular muscles intact.  HEENT: Head atraumatic, normocephalic. Oropharynx and nasopharynx clear.  NECK:  Supple, no jugular venous distention. No thyroid enlargement, no tenderness.  LUNGS: Normal breath sounds  bilaterally, no wheezing, rales,rhonchi or crepitation. No use of accessory muscles of respiration.  CARDIOVASCULAR: S1, S2 normal. No murmurs, rubs, or gallops.  ABDOMEN: Soft, nontender, nondistended. Bowel sounds present. No organomegaly or mass.  EXTREMITIES: No pedal edema, cyanosis, or clubbing.  NEUROLOGIC: Dysarthria, cranial nerves II through XII intact, right upper and lower extremity power 0 out of 5, left upper, lower extremity 5/5 Patient also complains of numbness on the right side.  PSYCHIATRIC: The patient is alert and oriented x 3.  SKIN: No obvious rash, lesion, or ulcer.    LABORATORY PANEL:   CBC Recent Labs  Lab 11/01/17 0513  WBC 4.3  HGB 11.1*  HCT 34.0*  PLT 129*   ------------------------------------------------------------------------------------------------------------------  Chemistries  Recent Labs  Lab 10/26/17 1007 11/01/17 0513  NA 131*  --   K 4.3  --   CL 92*  --   CO2 33*  --   GLUCOSE 111*  --   BUN 26*  --   CREATININE 0.93 0.66  CALCIUM 9.2  --   AST 23  --   ALT 16  --   ALKPHOS 45  --   BILITOT 1.7*  --    ------------------------------------------------------------------------------------------------------------------  Cardiac Enzymes Recent Labs  Lab 10/26/17 1007  TROPONINI 0.03*   ------------------------------------------------------------------------------------------------------------------  RADIOLOGY:  No results found.  EKG:   Orders placed or performed during the hospital encounter of 10/26/17  . ED EKG  . ED EKG  . EKG 12-Lead  . EKG 12-Lead  . EKG 12-Lead  . EKG 12-Lead    ASSESSMENT AND PLAN:  84 year old female with multiple medical problems of chronic  systolic heart failure with EF 25 to 30%, chronic atrial fibrillation not on anticoagulation, GERD, hypertension, hypothyroidism, chronic respiratory failure with oxygen 2 L at baseline comes in with [facility] independent living facility because  of said weakness of the started yesterday morning and patient is admitted for evaluation of TIA./CVA. 1.  Acute right-sided weakness likely due to embolic stroke patient ; since CT head  showed infarct in the left frontal lobe. Likely discharge tomorrow the skilled nursing facility, patient has been offered at [facility]. Started back on aspirin, statins. Seen by palliative care team.  2.  Chronic systolic heart failure with diffuse hypokinesia, EF 25 to 30%: Followed by Dr. Lady Gary from cardiology so cardiology consult is requested, patient has history of severe MR, TR, followed by CHF clinic.  Plan to resume Coreg, diuretic once the patient p.o. status is established.  Right now she is on fluids to help with hydration and prevent hydration   3.  Chronic atrial fibrillation, status post ICD, PPM placement. Patient is on Coreg, aspirin.  #4. right leg swelling, I do not see any right leg swelling, but registered nurse concerned about that so we did ultrasound of right leg did not show any DVT.  5.diarrhea secondary to laxative: Improved  Patient is a DNR, DNI, spoke with healthcare power of attorney Thomes Dinning and agree with meeting with tem.  Patient is on Ativan  as needed for anxiety.Marland Kitchen      all the records are reviewed and case discussed with Care Management/Social Workerr. Management plans discussed with the patient, family and they are in agreement.  CODE STATUS: DNR  TOTAL TIME TAKING CARE OF THIS PATIENT: 40 minutes   POSSIBLE D/C IN 2-3 DAYS, DEPENDING ON CLINICAL CONDITION. More than 50% of the time, spent in counseling, coordination of care, discussing with patient, reviewing the old charts.  Katha Hamming M.D on 11/01/2017 at 2:33 PM  Between 7am to 6pm - Pager - [phone]  After 6pm go to [email or web address] - password [email or web address] [facility]  [facility]  Office  [phone]  CC: Primary care physician; Patrice Paradise, MD   Note: This dictation was  prepared with Dragon dictation along with smaller phrase technology. Any transcriptional errors that result from this process are unintentional.

## 2017-11-01 NOTE — Progress Notes (Signed)
PT Cancellation Note  Patient Details Name: JAIMI BELLE MRN: [id number] DOB: 07/01/1928   Cancelled Treatment:    Reason Eval/Treat Not Completed: Fatigue/lethargy limiting ability to participate. Patient unable to participate with therapy this morning, lethargic/fatigued. PT will follow up this afternoon.  Olga Coaster PT, DPT 1:02 PM,11/01/17 [phone]

## 2017-11-01 NOTE — Progress Notes (Signed)
Nutrition Follow-up  DOCUMENTATION CODES:   Severe malnutrition in context of chronic illness  INTERVENTION:  Provide Hormel Shake (Vital Cuisine) TID with meals, each supplement provides 520 kcal and 22 grams of protein. Patient prefers vanilla.  Provide Magic cup TID with meals, each supplement provides 290 kcal and 9 grams of protein. Patient prefers vanilla.  Provide MVI daily.  Provide Ocuvite daily.  Provide vitamin C 250 mg BID.  NUTRITION DIAGNOSIS:   Severe Malnutrition related to chronic illness(CHF, Zenker's diverticulum, stage III pressure ulcer) as evidenced by severe fat depletion, severe muscle depletion.  New nutrition diagnosis.  GOAL:   Patient will meet greater than or equal to 90% of their needs  Progressing.  MONITOR:   PO intake, Supplement acceptance, Labs, Weight trends, Skin, I & O's  REASON FOR ASSESSMENT:   Consult Assessment of nutrition requirement/status  ASSESSMENT:   84 year old female with PMHx of CHF (EF 25-30%), HTN, hypothyroidism, dysrhythmia, hx MI, presence of AICD, arthritis, depression, GERD, OP admitted from [facility] with acute right-sided weakness likely due to embolic stroke. Patient underwent MBSS on 7/8 which found a moderate sized Zenker's diverticulum that captured both food and liquid material from trials.   -On 7/6 patient was downgraded to dysphagia 2 with nectar-thick liquids but was then made NPO. Following MBSS on 7/8 diet was put back at dysphagia 2 with nectar-thick liquids.  Met with patient and her friend at bedside. Patient reports she is not eating well. For lunch today she had bites of pudding and yogurt. She reports that she enjoys vanilla milk shakes and ice cream. Meal completion in chart has been variable. Also discussed with SLP. Family had reported that patient has had poor intake for a while related to coughing at meals (likely from Zenker's diverticulum).  Medications  reviewed and include: Lasix 40 mg daily PO, levothyroxine.  Labs reviewed: CBG 107.  Discussed with RN.  NUTRITION - FOCUSED PHYSICAL EXAM:    Most Recent Value  Orbital Region  Severe depletion  Upper Arm Region  Severe depletion  Thoracic and Lumbar Region  Severe depletion  Buccal Region  Severe depletion  Temple Region  Severe depletion  Clavicle Bone Region  Severe depletion  Clavicle and Acromion Bone Region  Severe depletion  Scapular Bone Region  Severe depletion  Dorsal Hand  Severe depletion  Patellar Region  Severe depletion  Anterior Thigh Region  Severe depletion  Posterior Calf Region  Severe depletion  Edema (RD Assessment)  Mild  Hair  Reviewed [dull and dry]  Eyes  Reviewed  Mouth  Unable to assess  Skin  Reviewed [ecchymosis]  Nails  Reviewed     Diet Order:   Diet Order           DIET DYS 2 Room service appropriate? Yes with Assist; Fluid consistency: Nectar Thick  Diet effective now          EDUCATION NEEDS:   Not appropriate for education at this time  Skin:  Skin Assessment: Skin Integrity Issues: Skin Integrity Issues:: Stage III, Other (Comment) Stage III: right ankle (0.2cm x 0.2cm x 0.2cm) Other: ecchymosis to bilateral arms and legs  Last BM:  10/30/2017 - medium type 7  Height:   Ht Readings from Last 1 Encounters:  10/26/17 _0  (1.626 m)    Weight:   Wt Readings from Last 1 Encounters:  10/26/17 131 lb 3.2 oz (59.5 kg)    Ideal Body Weight:  54.54 kg  BMI:  Body mass index is 22.52 kg/m.  Estimated Nutritional Needs:   Kcal:  3491-7915 (25-30 kcal/kg)  Protein:  75-90 grams (1.3-1.5 grams/kg)  Fluid:  1.5-1.8 L/day (1 mL/kcal)  Willey Blade, MS, RD, LDN Office: [phone] Pager: [phone] After Hours/Weekend Pager: [phone]

## 2017-11-01 NOTE — Plan of Care (Signed)
  Problem: Education: Goal: Knowledge of General Education information will improve Outcome: Progressing   Problem: Clinical Measurements: Goal: Ability to maintain clinical measurements within normal limits will improve Outcome: Progressing Goal: Will remain free from infection Outcome: Progressing Goal: Diagnostic test results will improve Outcome: Progressing Goal: Respiratory complications will improve Outcome: Progressing Note:  Using o2  2l [place] chronic Goal: Cardiovascular complication will be avoided Outcome: Progressing   Problem: Activity: Goal: Risk for activity intolerance will decrease Outcome: Progressing   Problem: Nutrition: Goal: Adequate nutrition will be maintained Outcome: Progressing   Problem: Coping: Goal: Level of anxiety will decrease Outcome: Progressing   Problem: Elimination: Goal: Will not experience complications related to bowel motility Outcome: Progressing Goal: Will not experience complications related to urinary retention Outcome: Progressing   Problem: Safety: Goal: Ability to remain free from injury will improve Outcome: Progressing

## 2017-11-01 NOTE — Progress Notes (Signed)
New referral for outpatient Palliative to follow at [facility] received from CSW [employer]. Patient information faxed to referral.  Dayna Barker RN, BSN, [facility] [place], [facility] [phone]

## 2017-11-01 NOTE — Plan of Care (Signed)

## 2017-11-01 NOTE — Progress Notes (Signed)
Physical Therapy Treatment Patient Details Name: Joanna Roberts MRN: [id number] DOB: 12/26/1928 Today's Date: 11/01/2017    History of Present Illness Joanna Roberts is an 84yo female who comes to [facility] on 7/3 after sudden insidious RLE paralysis. Head CT negative, but pt is being worked up for TIA v CVA at this time. Pt is pending doppler to r/o DVT hence exam is limited on this date. Since admission, pt has had progressive loss of RUE as well and nwo speech changes as of 7/5. PTA pt lived at [facility] ILF AMB with rollator, independent in ADL.     PT Comments    Patient with nursing and family at bedside at start of session. Patient more awake/alert than prior session, though cues needed to attend to task/keep eyes open throughout session. Patient needed max verbal/visual/tactile cues to perform AROM exercises on LLE, and PROM for RLE and RUE. Patient complained of pain with RLE pain, R UE/LE sensation assessed and patient reports feeling light touch intermittently. Patient was total assist for bed mobility and transfer to chair with stand pivot x 2. Patient up in chair with family at bedside with call bell in reach spO2 96%, HR mid 80s. The patient would benefit from further skilled PT to continue to address limitations to maximize mobility.      Follow Up Recommendations  SNF;Supervision/Assistance - 24 hour     Equipment Recommendations  Other (comment)(TBD at next venue)    Recommendations for Other Services       Precautions / Restrictions      Mobility  Bed Mobility Overal bed mobility: Needs Assistance Bed Mobility: Supine to Sit;Sit to Supine Rolling: Max assist   Supine to sit: Total assist        Transfers Overall transfer level: Needs assistance Equipment used: None Transfers: Stand Pivot Transfers   Stand pivot transfers: Total assist;+2 physical assistance       General transfer comment: Patient transferred to chair with total x 2 up to  chair  Ambulation/Gait             General Gait Details: unable   Stairs             Wheelchair Mobility    Modified Rankin (Stroke Patients Only)       Balance Overall balance assessment: Needs assistance Sitting-balance support: Single extremity supported Sitting balance-Leahy Scale: Zero   Postural control: Right lateral lean;Posterior lean     Standing balance comment: Patient unable to stand                            Cognition Arousal/Alertness: Lethargic   Overall Cognitive Status: Within Functional Limits for tasks assessed                                        Exercises General Exercises - Lower Extremity Ankle Circles/Pumps: PROM;Right;20 reps;AROM;Left Quad Sets: AROM;Left;15 reps Heel Slides: PROM;Right;20 reps;AROM;Left Shoulder Exercises Shoulder Flexion: PROM;15 reps;Right Elbow Flexion: PROM;Right;15 reps Elbow Extension: PROM;Right;15 reps Wrist Flexion: PROM;Right;15 reps Wrist Extension: PROM;Right;15 reps    General Comments General comments (skin integrity, edema, etc.): RUE positioned with pillows, as well as LUE to maintain upright position in chair      Pertinent Vitals/Pain Pain Assessment: Faces Pain Score: 2  Pain Location: Patient reports pain with touching/moving RLE Pain Descriptors / Indicators: Grimacing  Pain Intervention(s): Repositioned;Monitored during session    Home Living                      Prior Function            PT Goals (current goals can now be found in the care plan section)      Frequency    7X/week      PT Plan Current plan remains appropriate    Co-evaluation              AM-PAC PT "6 Clicks" Daily Activity  Outcome Measure  Difficulty turning over in bed (including adjusting bedclothes, sheets and blankets)?: Unable Difficulty moving from lying on back to sitting on the side of the bed? : Unable Difficulty sitting down on and  standing up from a chair with arms (e.g., wheelchair, bedside commode, etc,.)?: Unable Help needed moving to and from a bed to chair (including a wheelchair)?: Total Help needed walking in hospital room?: Total Help needed climbing 3-5 steps with a railing? : Total 6 Click Score: 6    End of Session Equipment Utilized During Treatment: Oxygen Activity Tolerance: Patient limited by fatigue;Patient limited by lethargy Patient left: in chair;with call bell/phone within reach;Other (comment);with family/visitor present Nurse Communication: Mobility status PT Visit Diagnosis: Muscle weakness (generalized) (M62.81);Difficulty in walking, not elsewhere classified (R26.2);Hemiplegia and hemiparesis Hemiplegia - Right/Left: Right Hemiplegia - dominant/non-dominant: Dominant Hemiplegia - caused by: Unspecified     Time: 0354-6568 PT Time Calculation (min) (ACUTE ONLY): 20 min  Charges:  $Therapeutic Activity: 8-22 mins                    G Codes:      Olga Coaster PT, DPT [phone] PM,11/01/17 [phone]

## 2017-11-02 DIAGNOSIS — Z950 Presence of cardiac pacemaker: Secondary | ICD-10-CM | POA: Diagnosis not present

## 2017-11-02 DIAGNOSIS — R110 Nausea: Secondary | ICD-10-CM | POA: Diagnosis not present

## 2017-11-02 DIAGNOSIS — K5900 Constipation, unspecified: Secondary | ICD-10-CM | POA: Diagnosis not present

## 2017-11-02 DIAGNOSIS — I5043 Acute on chronic combined systolic (congestive) and diastolic (congestive) heart failure: Secondary | ICD-10-CM | POA: Diagnosis not present

## 2017-11-02 DIAGNOSIS — M6281 Muscle weakness (generalized): Secondary | ICD-10-CM | POA: Diagnosis not present

## 2017-11-02 DIAGNOSIS — E039 Hypothyroidism, unspecified: Secondary | ICD-10-CM | POA: Diagnosis not present

## 2017-11-02 DIAGNOSIS — I4891 Unspecified atrial fibrillation: Secondary | ICD-10-CM | POA: Diagnosis not present

## 2017-11-02 DIAGNOSIS — R29898 Other symptoms and signs involving the musculoskeletal system: Secondary | ICD-10-CM | POA: Diagnosis not present

## 2017-11-02 DIAGNOSIS — Z8673 Personal history of transient ischemic attack (TIA), and cerebral infarction without residual deficits: Secondary | ICD-10-CM | POA: Diagnosis not present

## 2017-11-02 DIAGNOSIS — I639 Cerebral infarction, unspecified: Secondary | ICD-10-CM

## 2017-11-02 DIAGNOSIS — I130 Hypertensive heart and chronic kidney disease with heart failure and stage 1 through stage 4 chronic kidney disease, or unspecified chronic kidney disease: Secondary | ICD-10-CM | POA: Diagnosis not present

## 2017-11-02 DIAGNOSIS — R4182 Altered mental status, unspecified: Secondary | ICD-10-CM | POA: Diagnosis not present

## 2017-11-02 DIAGNOSIS — M810 Age-related osteoporosis without current pathological fracture: Secondary | ICD-10-CM | POA: Diagnosis not present

## 2017-11-02 DIAGNOSIS — G459 Transient cerebral ischemic attack, unspecified: Secondary | ICD-10-CM | POA: Diagnosis not present

## 2017-11-02 DIAGNOSIS — I10 Essential (primary) hypertension: Secondary | ICD-10-CM | POA: Diagnosis not present

## 2017-11-02 DIAGNOSIS — J9621 Acute and chronic respiratory failure with hypoxia: Secondary | ICD-10-CM | POA: Diagnosis not present

## 2017-11-02 DIAGNOSIS — Z7401 Bed confinement status: Secondary | ICD-10-CM | POA: Diagnosis not present

## 2017-11-02 DIAGNOSIS — Z515 Encounter for palliative care: Secondary | ICD-10-CM | POA: Diagnosis not present

## 2017-11-02 DIAGNOSIS — I69314 Frontal lobe and executive function deficit following cerebral infarction: Secondary | ICD-10-CM | POA: Diagnosis not present

## 2017-11-02 DIAGNOSIS — I509 Heart failure, unspecified: Secondary | ICD-10-CM | POA: Diagnosis not present

## 2017-11-02 DIAGNOSIS — G4700 Insomnia, unspecified: Secondary | ICD-10-CM | POA: Diagnosis not present

## 2017-11-02 DIAGNOSIS — R1312 Dysphagia, oropharyngeal phase: Secondary | ICD-10-CM | POA: Diagnosis not present

## 2017-11-02 DIAGNOSIS — F329 Major depressive disorder, single episode, unspecified: Secondary | ICD-10-CM | POA: Diagnosis not present

## 2017-11-02 DIAGNOSIS — E569 Vitamin deficiency, unspecified: Secondary | ICD-10-CM | POA: Diagnosis not present

## 2017-11-02 DIAGNOSIS — N183 Chronic kidney disease, stage 3 (moderate): Secondary | ICD-10-CM | POA: Diagnosis not present

## 2017-11-02 DIAGNOSIS — K219 Gastro-esophageal reflux disease without esophagitis: Secondary | ICD-10-CM | POA: Diagnosis not present

## 2017-11-02 DIAGNOSIS — I739 Peripheral vascular disease, unspecified: Secondary | ICD-10-CM | POA: Diagnosis not present

## 2017-11-02 DIAGNOSIS — M4120 Other idiopathic scoliosis, site unspecified: Secondary | ICD-10-CM | POA: Diagnosis not present

## 2017-11-02 DIAGNOSIS — I5023 Acute on chronic systolic (congestive) heart failure: Secondary | ICD-10-CM | POA: Diagnosis not present

## 2017-11-02 DIAGNOSIS — E785 Hyperlipidemia, unspecified: Secondary | ICD-10-CM | POA: Diagnosis not present

## 2017-11-02 DIAGNOSIS — J449 Chronic obstructive pulmonary disease, unspecified: Secondary | ICD-10-CM | POA: Diagnosis not present

## 2017-11-02 LAB — MAGNESIUM: Magnesium: 2.1 mg/dL (ref 1.7–2.4)

## 2017-11-02 LAB — BASIC METABOLIC PANEL
ANION GAP: 8 (ref 5–15)
BUN: 29 mg/dL — ABNORMAL HIGH (ref 8–23)
CALCIUM: 9 mg/dL (ref 8.9–10.3)
CO2: 37 mmol/L — AB (ref 22–32)
Chloride: 93 mmol/L — ABNORMAL LOW (ref 98–111)
Creatinine, Ser: 0.92 mg/dL (ref 0.44–1.00)
GFR calc non Af Amer: 54 mL/min — ABNORMAL LOW (ref >60)
Glucose, Bld: 117 mg/dL — ABNORMAL HIGH (ref 70–99)
POTASSIUM: 4 mmol/L (ref 3.5–5.1)
Sodium: 138 mmol/L (ref 135–145)

## 2017-11-02 MED ORDER — LEVALBUTEROL HCL 0.63 MG/3ML IN NEBU: 0.6300 mg | INHALATION_SOLUTION | Freq: Four times a day (QID) | RESPIRATORY_TRACT | 12 refills | Status: AC | PRN

## 2017-11-02 MED ORDER — ASCORBIC ACID 250 MG PO TABS: 250.0000 mg | ORAL_TABLET | Freq: Two times a day (BID) | ORAL | 0 refills | Status: AC

## 2017-11-02 MED ORDER — SCOPOLAMINE 1 MG/3DAYS TD PT72: 1.0000 | MEDICATED_PATCH | TRANSDERMAL | 12 refills | Status: AC

## 2017-11-02 MED ORDER — ATORVASTATIN CALCIUM 40 MG PO TABS: 40.0000 mg | ORAL_TABLET | Freq: Every day | ORAL | 0 refills | Status: AC

## 2017-11-02 MED ORDER — ADULT MULTIVITAMIN W/MINERALS CH
1.0000 | ORAL_TABLET | Freq: Every day | ORAL | 0 refills | Status: DC
Start: 2017-11-03 — End: 2017-11-02

## 2017-11-02 MED ORDER — OCUVITE-LUTEIN PO CAPS: 1.0000 | ORAL_CAPSULE | Freq: Every day | ORAL | 0 refills | Status: AC

## 2017-11-02 MED ORDER — ASPIRIN EC 325 MG PO TBEC: 325.0000 mg | DELAYED_RELEASE_TABLET | Freq: Every day | ORAL | 3 refills | Status: AC

## 2017-11-02 NOTE — Care Management Important Message (Signed)
Important Message  Patient Details  Name: Joanna Roberts MRN: [id number] Date of Birth: [birth year]   Medicare Important Message Given:  Yes    Olegario Messier A Layth Cerezo 11/02/2017, 1:35 PM

## 2017-11-02 NOTE — Plan of Care (Signed)
  Problem: Education: Goal: Knowledge of General Education information will improve Outcome: Progressing   Problem: Health Behavior/Discharge Planning: Goal: Ability to manage health-related needs will improve Outcome: Progressing   Problem: Clinical Measurements: Goal: Ability to maintain clinical measurements within normal limits will improve Outcome: Progressing Goal: Will remain free from infection Outcome: Progressing Goal: Diagnostic test results will improve Outcome: Progressing Goal: Respiratory complications will improve Outcome: Progressing Goal: Cardiovascular complication will be avoided Outcome: Progressing   Problem: Activity: Goal: Risk for activity intolerance will decrease Outcome: Progressing   Problem: Nutrition: Goal: Adequate nutrition will be maintained Outcome: Progressing   Problem: Coping: Goal: Level of anxiety will decrease Outcome: Progressing   Problem: Elimination: Goal: Will not experience complications related to bowel motility Outcome: Progressing Goal: Will not experience complications related to urinary retention Outcome: Progressing   Problem: Pain Managment: Goal: General experience of comfort will improve Outcome: Progressing   Problem: Safety: Goal: Ability to remain free from injury will improve Outcome: Progressing   Problem: Skin Integrity: Goal: Risk for impaired skin integrity will decrease Outcome: Progressing   Problem: Education: Goal: Knowledge of secondary prevention will improve Outcome: Progressing   Problem: Nutrition: Goal: Risk of aspiration will decrease Outcome: Progressing   Problem: Ischemic Stroke/TIA Tissue Perfusion: Goal: Complications of ischemic stroke/TIA will be minimized Outcome: Progressing

## 2017-11-02 NOTE — Clinical Social Work Note (Addendum)
Patient to be d/c'ed today to [facility] [place] room 711.  Patient and family agreeable to plans will transport via ems RN to call report to [address] nurse [phone].  CSW updated Boyd Kerbs, [phone] that patient is discharging today.  [facility] [place] [facility] [place] nurse liasion is aware of referral.   Windell Moulding, MSW, [facility] [phone]

## 2017-11-02 NOTE — Discharge Summary (Signed)
Joanna Roberts, is a 84 y.o. female  DOB 1928/08/12  MRN [id number].  Admission date:  10/26/2017  Admitting Physician  Katha Hamming, MD  Discharge Date:  11/02/2017   Primary MD  Patrice Paradise, MD  Recommendations for primary care physician for things to follow:   Follow-up with  PCP in one week follow-up with Dr. Lady Gary in one week to 10 days   Admission Diagnosis  TIA (transient ischemic attack) [G45.9] Left leg weakness [R29.898] Right leg weakness [R29.898] Stroke-like symptoms [R29.90]   Discharge Diagnosis  TIA (transient ischemic attack) [G45.9] Left leg weakness [R29.898] Right leg weakness [R29.898] Stroke-like symptoms [R29.90]    Active Problems:   Left leg weakness   Pressure injury of skin   Palliative care by specialist   DNR (do not resuscitate)   Weakness generalized   Adult failure to thrive      Past Medical History:  Diagnosis Date  . AICD (automatic cardioverter/defibrillator) present   . Allergy   . Anemia   . Arthritis   . Cataract   . CHF (congestive heart failure) (HCC)   . Depression   . Dysrhythmia   . GERD (gastroesophageal reflux disease)   . Heart murmur   . History of TIAs   . Hypertension   . Hypothyroidism   . Myocardial infarction (HCC)   . Osteoporosis   . Presence of permanent cardiac pacemaker   . Thyroid disease     Past Surgical History:  Procedure Laterality Date  . ABDOMINAL HYSTERECTOMY    . APPENDECTOMY    . HERNIA REPAIR    . IMPLANTABLE CARDIOVERTER DEFIBRILLATOR (ICD) GENERATOR CHANGE Left 07/27/2017   Procedure: ICD GENERATOR CHANGE;  Surgeon: Marcina Millard, MD;  Location: [facility] ORS;  Service: Cardiovascular;  Laterality: Left;  . INSERT / REPLACE / REMOVE PACEMAKER    . LOWER EXTREMITY ANGIOGRAPHY Left 02/08/2017   Procedure: Lower  Extremity Angiography;  Surgeon: Renford Dills, MD;  Location: [facility] INVASIVE CV LAB;  Service: Cardiovascular;  Laterality: Left;  . PERIPHERAL VASCULAR CATHETERIZATION Right 10/08/2014   Procedure: Lower Extremity Angiography;  Surgeon: Renford Dills, MD;  Location: [facility] CV [facility];  Service: Cardiovascular;  Laterality: Right;  . PERIPHERAL VASCULAR CATHETERIZATION  10/08/2014   Procedure: Lower Extremity Intervention;  Surgeon: Renford Dills, MD;  Location: [facility] CV [facility];  Service: Cardiovascular;;  . PERIPHERAL VASCULAR CATHETERIZATION N/A 03/23/2016   Procedure: Visceral Angiography;  Surgeon: Renford Dills, MD;  Location: [facility];  Service: Cardiovascular;  Laterality: N/A;  . pessary         History of present illness and  Hospital Course:     Kindly see H&P for history of present illness and admission details, please review complete Labs, Consult reports and Test reports for all details in brief  HPI  from the history and physical done on the day of admission 84 year old female patient with history of essential hypertension, GERD, arthritis, chronic atrial fibrillation [facility], admitted for evaluation of TIA.   Hospital Course  '1. Acute right leg weakness progressed to complete right side flaccid paralysis: patient initial discussion of the head had unremarkable but she could not get the MRI of the brain because of pacemaker. Patient subsequent CT scan of the head showed acute left frontal lobe stroke patient started on aspirin, monitored on telemetry, seen by neurologist, patient also seen by physical therapy, speech therapy,  occupational therapy. Patient has acute stroke likely due to emboli from chronic FA. Patient is not on anticoagulation atrial fibrillation for the past one year. Patient thought to have embolic stroke with complete  right side paralysis s. Seen by  neurology recommended no anticoagulation for at least 7 to 10 days. Patient is on aspirin,  full dose statins. Patient also seen by cardiologist, palliative care team.  speech is a repeated modified barium swallow which showed zenkers diverticulum . Because of complete lifestyle paralysis patient is seen by physical therapy recommended skilled nursing facility. Patient has bed at skilled nursing facility at [facility] and she will be going there today. Patient can be followed by palliative care nurse at the nursing home. Echocardiogram EF25 to 30%,  carotid ultrasound showed no hemodynamically significant stenosis. 2.Chronic systolic heart failure with diffuse hypokinesia, EF 25 to 30%: Followed by Dr. Lady Gary from cardiology so cardiology consult is requested, patient has history of severe MR, TR, followed by CHF clinic.   seen by Dr. Gwen Pounds. 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 Severe aortic stenosis, moderate MR. Started back on Coreg, diuretics. Patient has defibrillator, pacemaker.   3.Severe malnutrition in context of chronic illness ;Hormel Shake (Vital Cuisine) TID with meals, each supplement provides 520 kcal and 22 grams of protein. Patient prefers vanilla.  Provide Magic cup TID with meals, each supplement provides 290 kcal and 9 grams of protein. Patient prefers vanilla.  Provide MVI daily.  Provide Ocuvite daily.  Provide vitamin C 250 mg BID.  4.spoke with pts HCPOA: DAvidScott    discharge Condition:  stable   Follow UP  Contact information for after-discharge care    Destination    [facility] .   Service:  Skilled Nursing Contact information: [address] [place] [address] [phone]                Discharge Instructions  and  Discharge Medications      Allergies as of 11/02/2017      Reactions   Lortab  [hydrocodone-acetaminophen] Itching, Swelling, Rash   Doxycycline Diarrhea   Macrobid [nitrofurantoin Monohyd Macro] Diarrhea, Nausea And Vomiting   Nitrofurantoin Diarrhea, Nausea Only, Nausea And Vomiting      Medication List    TAKE these medications   acetaminophen 500 MG tablet Commonly known as:  TYLENOL Take 1,000 mg by mouth every 8 (eight) hours as needed for mild pain or headache.   ascorbic acid 250 MG tablet Commonly known as:  VITAMIN C Take 1 tablet (250 mg total) by mouth 2 (two) times daily.   aspirin EC 325 MG tablet Take 1 tablet (325 mg total) by mouth daily.   atorvastatin 40 MG tablet Commonly known as:  LIPITOR Take 1 tablet (40 mg total) by mouth daily at 6 PM.   carvedilol 6.25 MG tablet Commonly known as:  COREG Take 1 tablet (6.25 mg total) by mouth 2 (two) times daily.   docusate sodium 100 MG capsule Commonly known as:  COLACE Take 100 mg by mouth 2 (two) times daily.   furosemide 20 MG tablet Commonly known as:  LASIX Take 2 tablets (40 mg total) by mouth daily.   gabapentin 300 MG capsule Commonly known as:  NEURONTIN Take 1 capsule (300 mg total) by mouth 2 (two) times daily.   levalbuterol 0.63 MG/3ML nebulizer solution Commonly known as:  XOPENEX Inhale 3 mLs (0.63 mg  total) into the lungs every 6 (six) hours as needed for wheezing or shortness of breath.   levalbuterol 45 MCG/ACT inhaler Commonly known as:  XOPENEX HFA Inhale 1-2 puffs into the lungs every 6 (six) hours as needed for wheezing or shortness of breath.   levothyroxine 25 MCG tablet Commonly known as:  SYNTHROID, LEVOTHROID TAKE 1 TABLET EVERY DAY ON EMPTY STOMACHWITH A GLASS OF WATER AT LEAST 30-60 MINBEFORE BREAKFAST   multivitamin-lutein Caps capsule Take 1 capsule by mouth daily.   nitroGLYCERIN 0.4 MG SL tablet Commonly known as:  NITROSTAT Place 0.4 mg under the tongue every 5 (five) minutes as needed for chest pain.   scopolamine 1 MG/3DAYS Commonly  known as:  TRANSDERM-SCOP Place 1 patch (1.5 mg total) onto the skin every 3 (three) days. Start taking on:  11/04/2017   temazepam 7.5 MG capsule Commonly known as:  RESTORIL Take 7.5 mg by mouth at bedtime.   triamcinolone cream 0.1 % Commonly known as:  KENALOG Apply 1 application topically 2 (two) times daily as needed (for rash/skin irritation.).   vitamin B-12 1000 MCG tablet Commonly known as:  CYANOCOBALAMIN Take 1,000 mcg by mouth daily.         Diet and Activity recommendation: See Discharge Instructions above   Consults obtained - neurology, cardiology, palliative care team, dietitian, physical therapy, occupational therapy speech therapy   Major procedures and Radiology Reports - PLEASE review detailed and final reports for all details, in brief -      Ct Head Wo Contrast  Result Date: 10/28/2017 CLINICAL DATA:  Slurred speech with weakness on left side, new. Persistent right-sided weakness EXAM: CT HEAD WITHOUT CONTRAST TECHNIQUE: Contiguous axial images were obtained from the base of the skull through the vertex without intravenous contrast. COMPARISON:  October 27, 2017 FINDINGS: Brain: There is mild to moderate diffuse atrophy. There is no intracranial mass, hemorrhage, extra-axial fluid collection, or midline shift. There is an acute appearing infarct arising in the medial superior left frontal lobe adjacent to the falx. This infarct appears to involve the superior most aspect of the white matter of the centrum semiovale on the left. There is a minimal area of extension to the right of the falx in this area seen on axial slice 24 series 2. No other acute appearing infarct is evident. There is patchy small vessel disease in the centra semiovale bilaterally. Vascular: No hyperdense vessel evident. There is calcification in each carotid siphon and distal vertebral artery. Skull: Bony calvarium appears intact. Sinuses/Orbits: There is mucosal thickening in several ethmoid air  cells bilaterally. Other visualized paranasal sinuses are clear. Orbits appear symmetric bilaterally. There is evidence of cataract removals bilaterally. Other: Bony calvarium appears intact. There is debris in the left external auditory canal. IMPRESSION: Acute infarct in the anterior superior left frontal lobe with minimal extension to the right of the falx in the medial anterior right frontal lobe. This finding is best seen on axial slice 24 series 2. No other acute infarct evident. There is atrophy with patchy periventricular small vessel disease. No mass or hemorrhage evident. Foci of arterial vascular calcification noted. Mucosal thickening noted in several ethmoid air cells. These results were called by telephone at the time of interpretation on 10/28/2017 at 11:25 am to Dr. Katha Hamming , who verbally acknowledged these results. Electronically Signed   By: Bretta Bang III M.D.   On: 10/28/2017 11:25   Ct Head Wo Contrast  Result Date: 10/27/2017 CLINICAL DATA:  Persistent right-sided  weakness EXAM: CT HEAD WITHOUT CONTRAST TECHNIQUE: Contiguous axial images were obtained from the base of the skull through the vertex without intravenous contrast. COMPARISON:  10/26/2017 FINDINGS: Brain: Chronic atrophic and ischemic changes without acute abnormality. No findings to suggest acute hemorrhage, acute infarction or space-occupying mass lesion are noted. Vascular: No hyperdense vessel or unexpected calcification. Skull: Normal. Negative for fracture or focal lesion. Sinuses/Orbits: Stable mucosal thickening in the sphenoid sinus. Other: Previously seen venous air has resolved in the interval from the prior exam. IMPRESSION: Chronic atrophic and ischemic changes without acute abnormality. Electronically Signed   By: Alcide Clever M.D.   On: 10/27/2017 11:36   Ct Head Wo Contrast  Result Date: 10/26/2017 CLINICAL DATA:  Right-sided weakness EXAM: CT HEAD WITHOUT CONTRAST TECHNIQUE: Contiguous axial  images were obtained from the base of the skull through the vertex without intravenous contrast. COMPARISON:  09/29/2015 FINDINGS: Brain: Chronic atrophic and ischemic changes are identified and stable. Prior left lacunar infarct in the basal ganglia is noted. No findings to suggest acute hemorrhage, acute infarction or space-occupying mass lesion are noted. Vascular: No hyperdense vessel or unexpected calcification. Skull: Normal. Negative for fracture or focal lesion. Sinuses/Orbits: Mucosal changes are noted within the sphenoid sinus. Other: Some air is noted in the subcutaneous tissues of the face bilaterally likely related to retrograde filling from inadvertent air within an IV line. IMPRESSION: Chronic changes without acute intracranial abnormality. Mild sphenoid sinus disease. Electronically Signed   By: Alcide Clever M.D.   On: 10/26/2017 10:36   US Carotid Bilateral  Result Date: 10/27/2017 CLINICAL DATA:  84 year old female with a history of TIA. Cardiovascular risk factors include known stroke/TIA, known coronary artery disease EXAM: BILATERAL CAROTID DUPLEX ULTRASOUND TECHNIQUE: Wallace Cullens scale imaging, color Doppler and duplex ultrasound were performed of bilateral carotid and vertebral arteries in the neck. COMPARISON:  None. FINDINGS: Criteria: Quantification of carotid stenosis is based on velocity parameters that correlate the residual internal carotid diameter with NASCET-based stenosis levels, using the diameter of the distal internal carotid lumen as the denominator for stenosis measurement. The following velocity measurements were obtained: RIGHT ICA:  Systolic 37 cm/sec, Diastolic 6 cm/sec CCA:  37 cm/sec SYSTOLIC ICA/CCA RATIO:  1.0 ECA:  37 cm/sec LEFT ICA:  Systolic 106 cm/sec, Diastolic 38 cm/sec CCA:  50 cm/sec SYSTOLIC ICA/CCA RATIO:  2.1 ECA:  72 cm/sec Right Brachial SBP: Not acquired Left Brachial SBP: Not acquired RIGHT CAROTID ARTERY: No significant calcifications of the right common  carotid artery. Intermediate waveform maintained. Heterogeneous and partially calcified plaque at the right carotid bifurcation. No significant lumen shadowing. Low resistance waveform of the right ICA, with parvus tardus configuration. No significant tortuosity. RIGHT VERTEBRAL ARTERY: Antegrade flow with low resistance waveform, with parvus tardus configuration. LEFT CAROTID ARTERY: No significant calcifications of the left common carotid artery. Intermediate waveform maintained. Heterogeneous and partially calcified plaque at the left carotid bifurcation without significant lumen shadowing. Low resistance waveform of the left ICA, with parvus tardus configuration. No significant tortuosity. LEFT VERTEBRAL ARTERY: Antegrade flow with low resistance waveform, with parvus tardus configuration. IMPRESSION: Color duplex indicates minimal heterogeneous and calcified plaque, with no hemodynamically significant stenosis by duplex criteria in the extracranial cerebrovascular circulation. The parvus tardus configuration throughout the cerebral vessels may reflect aortic stenosis. Correlation with ECHO recommended, if not already performed. Signed, Yvone Neu. Reyne Dumas, RPVI [facility] Radiology Electronically Signed   By: Gilmer Mor D.O.   On: 10/27/2017 08:05  US Venous Img Lower Bilateral  Result Date: 10/28/2017 CLINICAL DATA:  84 year old female with bilateral lower extremity leg swelling and skin discoloration EXAM: BILATERAL LOWER EXTREMITY VENOUS DOPPLER ULTRASOUND TECHNIQUE: Gray-scale sonography with graded compression, as well as color Doppler and duplex ultrasound were performed to evaluate the lower extremity deep venous systems from the level of the common femoral vein and including the common femoral, femoral, profunda femoral, popliteal and calf veins including the posterior tibial, peroneal and gastrocnemius veins when visible. The superficial great  saphenous vein was also interrogated. Spectral Doppler was utilized to evaluate flow at rest and with distal augmentation maneuvers in the common femoral, femoral and popliteal veins. COMPARISON:  None. FINDINGS: RIGHT LOWER EXTREMITY Common Femoral Vein: No evidence of thrombus. Normal compressibility, respiratory phasicity and response to augmentation. Increased pulsatility of the venous waveforms. Saphenofemoral Junction: No evidence of thrombus. Normal compressibility and flow on color Doppler imaging. Profunda Femoral Vein: No evidence of thrombus. Normal compressibility and flow on color Doppler imaging. Femoral Vein: No evidence of thrombus. Normal compressibility, respiratory phasicity and response to augmentation. Popliteal Vein: No evidence of thrombus. Normal compressibility, respiratory phasicity and response to augmentation. Calf Veins: No evidence of thrombus. Normal compressibility and flow on color Doppler imaging. Superficial Great Saphenous Vein: No evidence of thrombus. Normal compressibility. Venous Reflux:  None. Other Findings:  None. LEFT LOWER EXTREMITY Common Femoral Vein: No evidence of thrombus. Normal compressibility, respiratory phasicity and response to augmentation. Increased pulsatility of the venous waveforms. Saphenofemoral Junction: No evidence of thrombus. Normal compressibility and flow on color Doppler imaging. Profunda Femoral Vein: No evidence of thrombus. Normal compressibility and flow on color Doppler imaging. Femoral Vein: No evidence of thrombus. Normal compressibility, respiratory phasicity and response to augmentation. Popliteal Vein: No evidence of thrombus. Normal compressibility, respiratory phasicity and response to augmentation. Calf Veins: No evidence of thrombus. Normal compressibility and flow on color Doppler imaging. Superficial Great Saphenous Vein: No evidence of thrombus. Normal compressibility. Venous Reflux:  None. Other Findings:  None. IMPRESSION: 1. No  evidence of deep venous thrombosis in either lower extremity. 2. Increased pulsatility of the venous waveforms bilaterally consistent with elevated right heart pressures. Differential considerations include tricuspid regurgitation, right heart failure, pulmonary hypertension and long-standing COPD. Electronically Signed   By: Malachy Moan M.D.   On: 10/28/2017 15:55   Dg Femur Min 2 Views Right  Result Date: 10/26/2017 CLINICAL DATA:  Right lower extremity pain and weakness, no reported injury EXAM: RIGHT FEMUR 2 VIEWS COMPARISON:  None. FINDINGS: There is no evidence of fracture or other focal bone lesions. Small superior right patellar enthesophyte. Vascular calcifications in the soft tissues. Surgical clip in soft tissues medial to the mid right femur. IMPRESSION: No fracture. Electronically Signed   By: Delbert Phenix M.D.   On: 10/26/2017 10:39    Micro Results    No results found for this or any previous visit (from the past 240 hour(s)).     Today   Subjective:   Joanna Roberts has right-sided dialysis, tolerating diet, no shortness of breath. Follow full aspiration precautions, continued physical therapy as tolerated and patient is stable for discharge  Objective:   Blood pressure (!) 145/87, pulse 88, temperature (!) 97.5 F (36.4 C), temperature source Oral, resp. rate (!) 24, height 5\' 4"  (1.626 m), weight 59.5 kg (131 lb 3.2 oz), SpO2 98 %.   Intake/Output Summary (Last 24 hours) at 11/02/2017 0916 Last data filed at 11/02/2017 0507 Gross per 24 hour  Intake  25 ml  Output 600 ml  Net -575 ml    Exam Awake Alert, Oriented x 3,  patient has complete rise or paralysis secondary to acute stroke  [place].AT,PERRAL Supple Neck,No JVD, No cervical lymphadenopathy appriciated.  Symmetrical Chest wall movement, Good air movement bilaterally, CTAB RRR,No Gallops,Rubs or new Murmurs, No Parasternal Heave +ve B.Sounds, Abd Soft, Non tender, No organomegaly appriciated, No rebound  -guarding or rigidity. No Cyanosis, Clubbing or edema, patient has big ecchymosis on the right Data Review   CBC w Diff:  Lab Results  Component Value Date   WBC 4.3 11/01/2017   HGB 11.1 (L) 11/01/2017   HGB 11.3 (L) 11/02/2013   HCT 34.0 (L) 11/01/2017   HCT 34.6 (L) 11/02/2013   PLT 129 (L) 11/01/2017   PLT 128 (L) 11/02/2013   LYMPHOPCT 11 10/26/2017   LYMPHOPCT 20.9 11/02/2013   BANDSPCT 3 03/15/2015   MONOPCT 17 10/26/2017   MONOPCT 11.0 11/02/2013   EOSPCT 2 10/26/2017   EOSPCT 3.6 11/02/2013   BASOPCT 1 10/26/2017   BASOPCT 1.6 11/02/2013    CMP:  Lab Results  Component Value Date   NA 138 11/02/2017   NA 140 04/23/2014   K 4.0 11/02/2017   K 4.8 04/23/2014   CL 93 (L) 11/02/2017   CL 109 (H) 04/23/2014   CO2 37 (H) 11/02/2017   CO2 26 04/23/2014   BUN 29 (H) 11/02/2017   BUN 22 (H) 04/23/2014   CREATININE 0.92 11/02/2017   CREATININE 1.02 04/23/2014   PROT 6.6 10/26/2017   PROT 7.7 10/31/2013   ALBUMIN 3.6 10/26/2017   ALBUMIN 3.5 10/31/2013   BILITOT 1.7 (H) 10/26/2017   BILITOT 0.5 10/31/2013   ALKPHOS 45 10/26/2017   ALKPHOS 63 10/31/2013   AST 23 10/26/2017   AST 24 10/31/2013   ALT 16 10/26/2017   ALT 22 10/31/2013  .   Total Time in preparing paper work, data evaluation and todays exam - 35 minutes  Katha Hamming M.D on 11/02/2017 at 9:16 AM    Note: This dictation was prepared with Dragon dictation along with smaller phrase technology. Any transcriptional errors that result from this process are unintentional.

## 2017-11-02 NOTE — Progress Notes (Signed)
Patient transported via EMS to [facility].  Onalee Hua, [place] made aware.  Orson Ape, RN

## 2017-11-02 NOTE — Progress Notes (Signed)
Report called to Selena Batten, RN at [facility] and EMS contacted for transport.  Orson Ape, RN

## 2017-11-02 NOTE — Progress Notes (Signed)
Speech Language Pathology Treatment: Dysphagia  Patient Details Name: Joanna Roberts MRN: [id number] DOB: 03/24/1929 Today's Date: 11/02/2017 Time: 1140-1230 SLP Time Calculation (min) (ACUTE ONLY): 50 min  Assessment / Plan / Recommendation Clinical Impression  Pt seen for ongoing toleration of dysphagia diet; follow through w/ precautions and strategies w/ dysphagia diet in light of progressing CVA since admission as well as her BASELINE Esophageal dysmotility d/t a Zenker's Diverticulum. MBSS was performed on 10/31/2017 which revealed:  "MILD+ oropharyngeal phase dysphagia w/ min increased risk for aspiration from an oropharyngeal phase standpoint. HOWEVER, pt is noted to have a MODERATE size ZENKER'S DIVERTICULUM which captured both food and liquid material from trials during this eval. Of important note, RETROGRADE ACTIVITY caused bolus material from the Zenker's Diverticulum to move back into the Pharynx during the trials w/ reduced sensation and cough response from the pt herself. Pt is at HIGH risk for Aspiration of food/liquid material coming out of the Zenker's Diverticulum back into the Pharynx thus impacting her airway and Pulmonary status". Pt positioned upright in bed in order that she could help feed herself - this is the safest way as pt will feel the "fullness" of her Esophagus as she continues to take oral intake and monitor this for any needed rest breaks or need to f/u, dry swallow to aid Esophageal clearing. Pt consumed po trials soft, chopped foods (dys. Level 2) and Nectar consistency liquids via Cup w/ no immediate, overt s/s of aspiration noted; clear vocal quality noted b/t trials. Pt took her time feeding herself but noted she needed cues to alternate foods/liquids to aid Esophageal clearing. Pt was able to continue eating her meal for ~15-20 mins b/f a mild, delayed cough was noted x2. Pt encouraged to STOP eating at that time to let clearing of the Esophagus(and the Zenker's  Diverticulum) occur. Pt consumed a few more trials of Nectar liquids after a rest break. Pt did fatigue easily and benefited from some feeding support toward the end but always suggested hold the Cup to drink and monitor intake herself(better safety). Pt stated she felt "full" and meal was stopped. Encouraged her to snack in ~1 hour on fruit and yogurt.   Due to pt's baseline deficit of a Zenker's Diverticulum impacting Esophageal motility w/ retrograde activity and risk for aspiration of such material, pt is at risk for aspiration w/ any oral intake. Pt's new dx of CVA also increases her risk for dysphagia. Recommend ongoing dysphagia diet of Dys. Level 2(MINCED foods) w/ NECTAR consistency liquids w/ strict aspiration precautions and Esophageal motility precautions. Recommend ST services to continue to f/u w/ pt's status, toleration of diet, education w/ pt/family on pt's dysphagia/status. Recommend Feeding assistance and monitoring during meals; Dietician f/u for support; Palliative Care to follow pt and family for goals of care, education. NSG updated.     HPI HPI: Pt is a 84 y.o. female with a history of anemia, arthritis, CHF (EF 25-30%), GERD, hypertension, hypothyroidism, MI, on 2L O2 at baseline, who presents to the ED for right leg weakness that started this am and was unable to walk.  Patient arrives from [facility] [place].  No facial droop or speech issues.  She was last known normal when she went to bed that night.  She denies any recent illness or other complaints.  Patient lives at independent facility and she is very independent but started to have sudden onset of right leg weakness unable to ambulate.  Pt also c/o being unable to  use her R arm this morning at this evaluation.  OT/PT following.  Pt is on a mech soft diet since admission ~2 days ago. Pt has c/o intermittent coughing when drinking liquids at home but did not report an increase if frequency since admission. NSG reported increased R  sided weakness during this admission and concern for dysphagia. Pt has a history of congestive heart failure and atrial fibrillation on no anticoagulants currently d/t stroke; continue w/ Lasix per Cardiologist. Regarding swallowing, pt stated she has had trouble swallowing at home prior to this admission - "gets easily choked and strangled". Family endorsed this as well. Per family, pt has a h/o Hiatal Hernia.       SLP Plan  Continue with current plan of care       Recommendations  Diet recommendations: Dysphagia 2 (fine chop);Nectar-thick liquid Liquids provided via: Cup;No straw Medication Administration: Crushed with puree(as able or in liquid form mixed in a puree) Supervision: Patient able to self feed;Staff to assist with self feeding;Intermittent supervision to cue for compensatory strategies(for follow through w/ precautions) Compensations: Minimize environmental distractions;Slow rate;Small sips/bites;Lingual sweep for clearance of pocketing;Multiple dry swallows after each bite/sip;Follow solids with liquid Postural Changes and/or Swallow Maneuvers: Seated upright 90 degrees;Upright 30-60 min after meal                General recommendations: (Dietician f/u) Oral Care Recommendations: Oral care BID;Staff/trained caregiver to provide oral care Follow up Recommendations: Skilled Nursing facility SLP Visit Diagnosis: Dysphagia, oropharyngeal phase (R13.12);Dysphagia, unspecified (R13.10)(Esophageal phase dysmotility - Zenker's Diverticulum) Plan: Continue with current plan of care       GO                Jerilynn Som, MS, CCC-SLP Aemon Koeller 11/02/2017, 2:42 PM

## 2017-11-02 NOTE — Clinical Social Work Placement (Signed)
   CLINICAL SOCIAL WORK PLACEMENT  NOTE  Date:  11/02/2017  Patient Details  Name: Joanna Roberts MRN: [id number] Date of Birth: 1928/09/11  Clinical Social Work is seeking post-discharge placement for this patient at the Skilled  Nursing Facility level of care (*CSW will initial, date and re-position this form in  chart as items are completed):  Yes   Patient/family provided with [facility] Clinical Social Work Department's list of facilities offering this level of care within the geographic area requested by the patient (or if unable, by the patient's family).  Yes   Patient/family informed of their freedom to choose among providers that offer the needed level of care, that participate in Medicare, Medicaid or managed care program needed by the patient, have an available bed and are willing to accept the patient.  Yes   Patient/family informed of [facility]'s ownership interest in [facility] and [facility], as well as of the fact that they are under no obligation to receive care at these facilities.  PASRR submitted to EDS on 10/27/17     PASRR number received on       Existing PASRR number confirmed on 10/27/17     FL2 transmitted to all facilities in geographic area requested by pt/family on 10/27/17     FL2 transmitted to all facilities within larger geographic area on       Patient informed that his/her managed care company has contracts with or will negotiate with certain facilities, including the following:        Yes   Patient/family informed of bed offers received.  Patient chooses bed at [facility]     Physician recommends and patient chooses bed at      Patient to be transferred to [facility] on 11/02/17.  Patient to be transferred to facility by [facility]     Patient family notified on 11/02/17 of transfer.  Name of family member notified:  Boyd Kerbs, [phone]     PHYSICIAN Please sign FL2, Please sign  DNR, Please prepare prescriptions, Please prepare priority discharge summary, including medications     Additional Comment:    _______________________________________________ Darleene Cleaver, LCSWA 11/02/2017, 10:41 AM

## 2017-11-02 NOTE — Progress Notes (Signed)
Subjective: Patient with continued right sided weakness.  Very depressed.    Objective: Current vital signs: BP (!) 145/87   Pulse 88   Temp (!) 97.5 F (36.4 C) (Oral)   Resp (!) 24   Ht 5\' 4"  (1.626 m)   Wt 59.5 kg (131 lb 3.2 oz)   SpO2 98%   BMI 22.52 kg/m  Vital signs in last 24 hours: Temp:  [97.5 F (36.4 C)-98.2 F (36.8 C)] 97.5 F (36.4 C) (07/10 0828) Pulse Rate:  [79-93] 88 (07/10 0828) Resp:  [18-24] 24 (07/10 0511) BP: (107-145)/(72-92) 145/87 (07/10 0828) SpO2:  [96 %-100 %] 98 % (07/10 0828)  Intake/Output from previous day: 07/09 0701 - 07/10 0700 In: 25 [P.O.:25] Out: 600 [Urine:600] Intake/Output this shift: Total I/O In: 150 [P.O.:150] Out: -  Nutritional status:  Diet Order           DIET DYS 2 Room service appropriate? Yes with Assist; Fluid consistency: Nectar Thick  Diet effective now          Neurologic Exam: Mental Status: Alert, oriented.. Dysarthric  Follows commands. Cranial Nerves: II: Discs flat bilaterally; Visual fields grossly normal, pupils equal, round, reactive to light and accommodation III,IV, VI: ptosis not present, extra-ocular motions intact bilaterally V,VII: left facial droop, facial light touch sensation normal bilaterally VIII: hearing normal bilaterally IX,X: gag reflex present XI: bilateral shoulder shrug XII: midline tongue extension Motor: Right :  Upper extremity   0/5                                      Left:     Upper extremity   5/5             Lower extremity   0/5                                                  Lower extremity   5/5 Tone and bulk:normal tone throughout; no atrophy noted Sensory: Pinprick and light touch decreased on the right  Lab Results: Basic Metabolic Panel: Recent Labs  Lab 11/01/17 0513 11/02/17 0532  NA  --  138  K  --  4.0  CL  --  93*  CO2  --  37*  GLUCOSE  --  117*  BUN  --  29*  CREATININE 0.66 0.92  CALCIUM  --  9.0  MG  --  2.1    Liver Function  Tests: No results for input(s): AST, ALT, ALKPHOS, BILITOT, PROT, ALBUMIN in the last 168 hours. No results for input(s): LIPASE, AMYLASE in the last 168 hours. No results for input(s): AMMONIA in the last 168 hours.  CBC: Recent Labs  Lab 11/01/17 0513  WBC 4.3  HGB 11.1*  HCT 34.0*  MCV 98.8  PLT 129*    Cardiac Enzymes: No results for input(s): CKTOTAL, CKMB, CKMBINDEX, TROPONINI in the last 168 hours.  Lipid Panel: Recent Labs  Lab 10/27/17 0404  CHOL 117  TRIG 51  HDL 39*  CHOLHDL 3.0  VLDL 10  LDLCALC 68    CBG: Recent Labs  Lab 10/30/17 0747  GLUCAP 107*    Microbiology: Results for orders placed or performed during the hospital encounter of 09/23/17  MRSA PCR Screening  Status: None   Collection Time: 09/24/17  2:25 AM  Result Value Ref Range Status   MRSA by PCR NEGATIVE NEGATIVE Final    Comment:        The GeneXpert MRSA Assay (FDA approved for NASAL specimens only), is one component of a comprehensive MRSA colonization surveillance program. It is not intended to diagnose MRSA infection nor to guide or monitor treatment for MRSA infections. Performed at [facility], [address] [place], [place] [address]     Coagulation Studies: No results for input(s): LABPROT, INR in the last 72 hours.  Imaging: No results found.  Medications:  I have reviewed the patient's current medications. Scheduled: . aspirin  81 mg Oral Daily  . atorvastatin  40 mg Oral q1800  . carvedilol  6.25 mg Oral BID WC  . enoxaparin (LOVENOX) injection  40 mg Subcutaneous Q24H  . furosemide  40 mg Oral Daily  . levothyroxine  25 mcg Oral QAC breakfast  . multivitamin with minerals  1 tablet Oral Daily  . multivitamin-lutein  1 capsule Oral Daily  . scopolamine  1 patch Transdermal Q72H  . temazepam  7.5 mg Oral QHS  . vitamin C  250 mg Oral BID    Assessment/Plan: Patient continues to have right hemiplegia.  History of afib.  On ASA.   Patient not a candidate for anticoagulation.  Reviewed records and patient with a history of leg hematoma on anticoagulation requiring transfusion.  Patient currently with significant bruising on skin.   On ASA and statin.  Awaiting placement.     LOS: 5 days   Thana Farr, MD Neurology [phone] 11/02/2017  12:23 PM

## 2017-11-03 ENCOUNTER — Encounter (INDEPENDENT_AMBULATORY_CARE_PROVIDER_SITE_OTHER): Payer: PPO

## 2017-11-03 ENCOUNTER — Ambulatory Visit (INDEPENDENT_AMBULATORY_CARE_PROVIDER_SITE_OTHER): Payer: PPO | Admitting: Vascular Surgery

## 2017-11-03 DIAGNOSIS — I509 Heart failure, unspecified: Secondary | ICD-10-CM | POA: Diagnosis not present

## 2017-11-03 DIAGNOSIS — I69314 Frontal lobe and executive function deficit following cerebral infarction: Secondary | ICD-10-CM | POA: Diagnosis not present

## 2017-11-03 DIAGNOSIS — I10 Essential (primary) hypertension: Secondary | ICD-10-CM | POA: Diagnosis not present

## 2017-11-03 DIAGNOSIS — K219 Gastro-esophageal reflux disease without esophagitis: Secondary | ICD-10-CM | POA: Diagnosis not present

## 2017-11-03 DIAGNOSIS — E039 Hypothyroidism, unspecified: Secondary | ICD-10-CM | POA: Diagnosis not present

## 2017-11-03 DIAGNOSIS — E785 Hyperlipidemia, unspecified: Secondary | ICD-10-CM | POA: Diagnosis not present

## 2017-11-03 DIAGNOSIS — R110 Nausea: Secondary | ICD-10-CM | POA: Diagnosis not present

## 2017-11-07 DIAGNOSIS — Z8673 Personal history of transient ischemic attack (TIA), and cerebral infarction without residual deficits: Secondary | ICD-10-CM | POA: Diagnosis not present

## 2017-11-07 DIAGNOSIS — Z515 Encounter for palliative care: Secondary | ICD-10-CM | POA: Diagnosis not present

## 2017-11-07 DIAGNOSIS — M810 Age-related osteoporosis without current pathological fracture: Secondary | ICD-10-CM | POA: Diagnosis not present

## 2017-11-07 DIAGNOSIS — F329 Major depressive disorder, single episode, unspecified: Secondary | ICD-10-CM | POA: Diagnosis not present

## 2017-11-07 DIAGNOSIS — M4120 Other idiopathic scoliosis, site unspecified: Secondary | ICD-10-CM | POA: Diagnosis not present

## 2017-11-07 DIAGNOSIS — I4891 Unspecified atrial fibrillation: Secondary | ICD-10-CM | POA: Diagnosis not present

## 2017-11-07 DIAGNOSIS — I130 Hypertensive heart and chronic kidney disease with heart failure and stage 1 through stage 4 chronic kidney disease, or unspecified chronic kidney disease: Secondary | ICD-10-CM | POA: Diagnosis not present

## 2017-11-07 DIAGNOSIS — I5043 Acute on chronic combined systolic (congestive) and diastolic (congestive) heart failure: Secondary | ICD-10-CM | POA: Diagnosis not present

## 2017-11-07 DIAGNOSIS — I509 Heart failure, unspecified: Secondary | ICD-10-CM | POA: Diagnosis not present

## 2017-11-07 DIAGNOSIS — J9621 Acute and chronic respiratory failure with hypoxia: Secondary | ICD-10-CM | POA: Diagnosis not present

## 2017-11-07 DIAGNOSIS — E039 Hypothyroidism, unspecified: Secondary | ICD-10-CM | POA: Diagnosis not present

## 2017-11-07 DIAGNOSIS — N183 Chronic kidney disease, stage 3 (moderate): Secondary | ICD-10-CM | POA: Diagnosis not present

## 2017-11-07 DIAGNOSIS — I739 Peripheral vascular disease, unspecified: Secondary | ICD-10-CM | POA: Diagnosis not present

## 2017-11-07 DIAGNOSIS — Z950 Presence of cardiac pacemaker: Secondary | ICD-10-CM | POA: Diagnosis not present

## 2017-11-24 DEATH — deceased

## 2017-12-06 ENCOUNTER — Ambulatory Visit: Payer: PPO | Admitting: Family

## 2017-12-06 ENCOUNTER — Telehealth: Payer: Self-pay | Admitting: Family

## 2017-12-06 NOTE — Telephone Encounter (Signed)
Patient did not show for her [facility] appointment on 12/06/17. Will attempt to reschedule.  

## 2018-04-03 ENCOUNTER — Encounter (INDEPENDENT_AMBULATORY_CARE_PROVIDER_SITE_OTHER): Payer: PPO

## 2018-04-03 ENCOUNTER — Ambulatory Visit (INDEPENDENT_AMBULATORY_CARE_PROVIDER_SITE_OTHER): Payer: PPO | Admitting: Vascular Surgery

## 2018-06-25 DEATH — deceased

## 2019-04-08 IMAGING — CT CT ANGIO EXTREM LOW*R*
2 of 5 series · 10 of 46 positions shown, 11 images · IV contrast (APPLIED)
Comparison: 08/27/2016

CLINICAL DATA: Follow up for right lower extremity pain and
swelling. Done two weeks ago. Patient is in extreme pain, hasn't
gotten better.

EXAM:
CT ANGIOGRAPHY OF THE RIGHT LOWEREXTREMITY
TECHNIQUE: Multidetector CT imaging of the right lowerwas performed using the
standard protocol during bolus administration of intravenous
contrast. Multiplanar CT image reconstructions and MIPs were
obtained to evaluate the vascular anatomy.
CONTRAST:  100 mL of Isovue 370 intravenous contrast

[Series 4: axial arterial upper · axial · arterial · 0.51mm/px · z∈[-748,+38]mm · 7 of 326 slices shown, 8 images]
[im 32/326  soft-tissue]
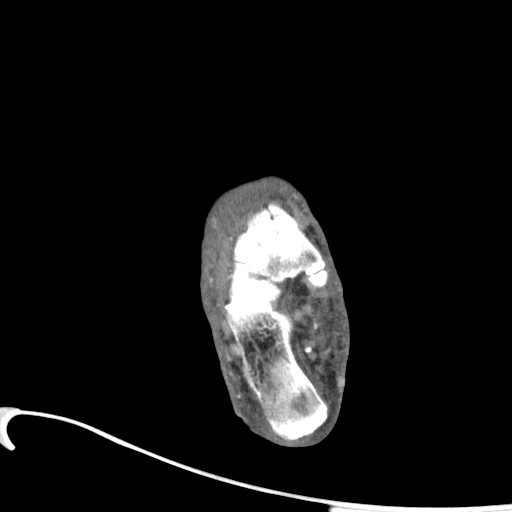
[im 32/326  bone]
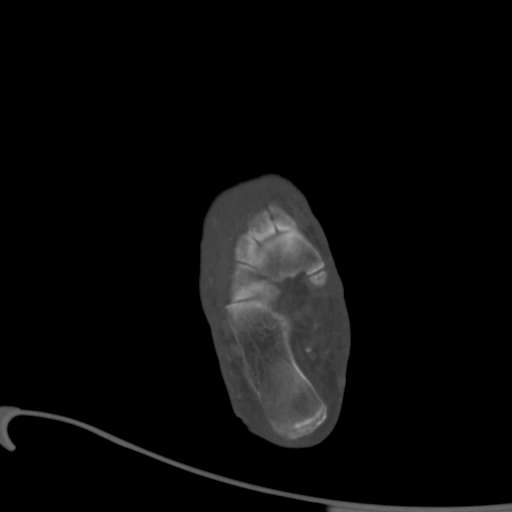
[im 74/326  soft-tissue]
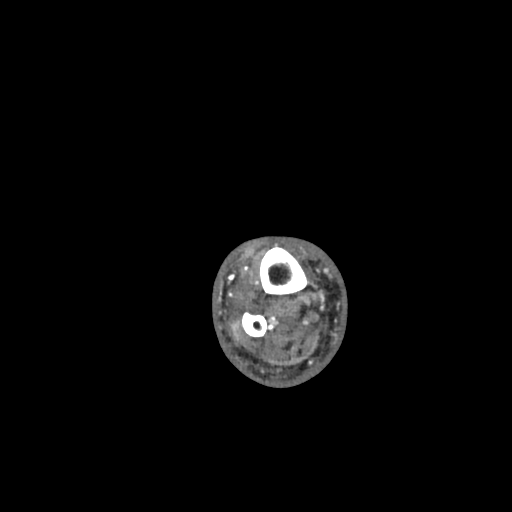
[im 116/326  soft-tissue]
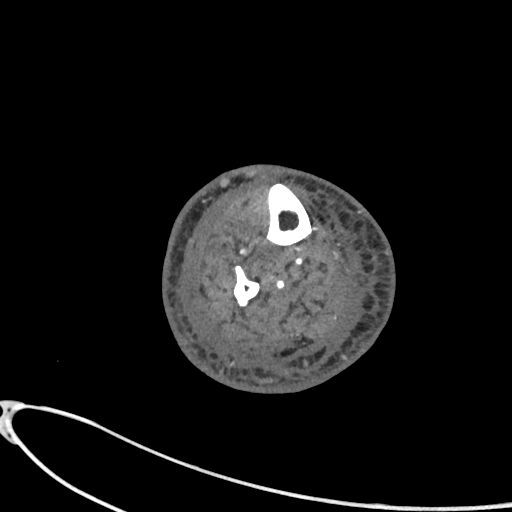
[im 168/326  soft-tissue]
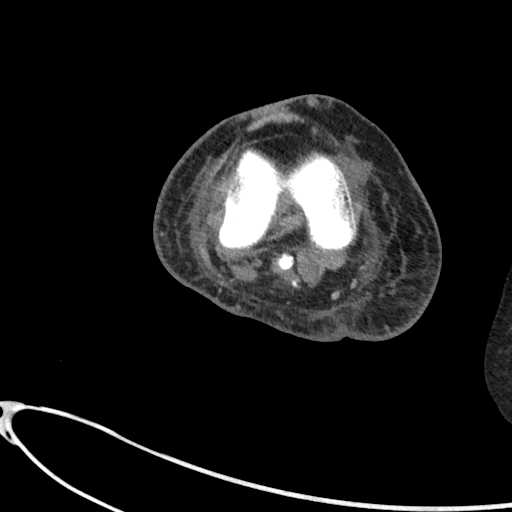
[im 210/326  soft-tissue]
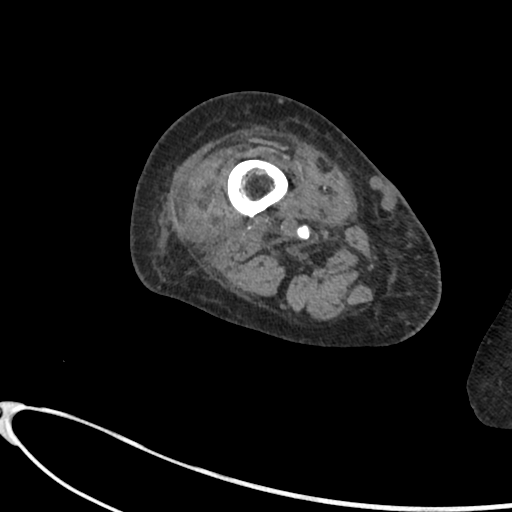
[im 252/326  soft-tissue]
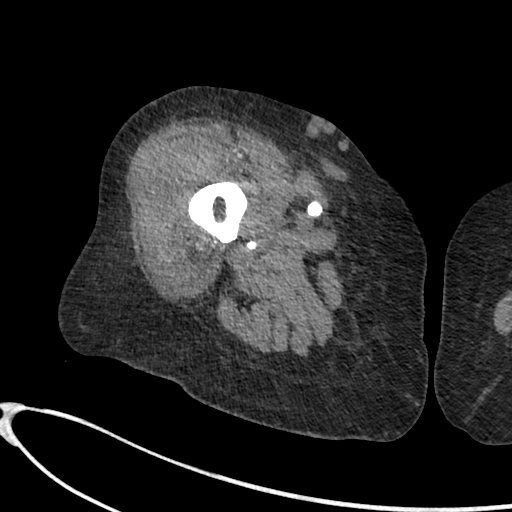
[im 294/326  soft-tissue]
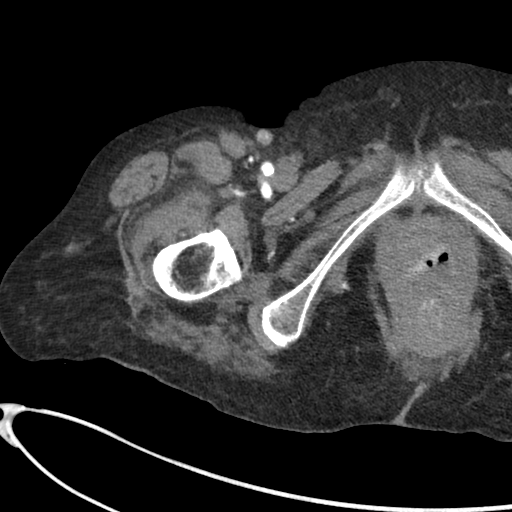

[Series 5: coronal upper · coronal · 0.59mm/px · 3 of 90 slices shown]
[im 23/90  soft-tissue]
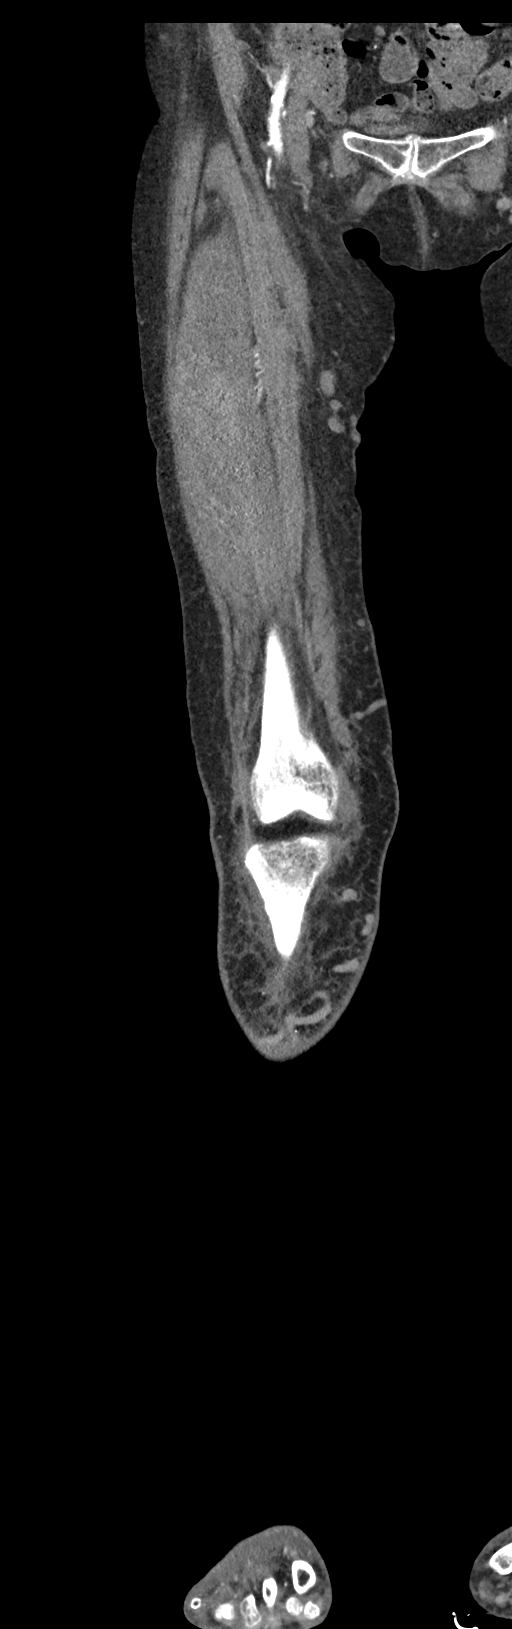
[im 45/90  soft-tissue]
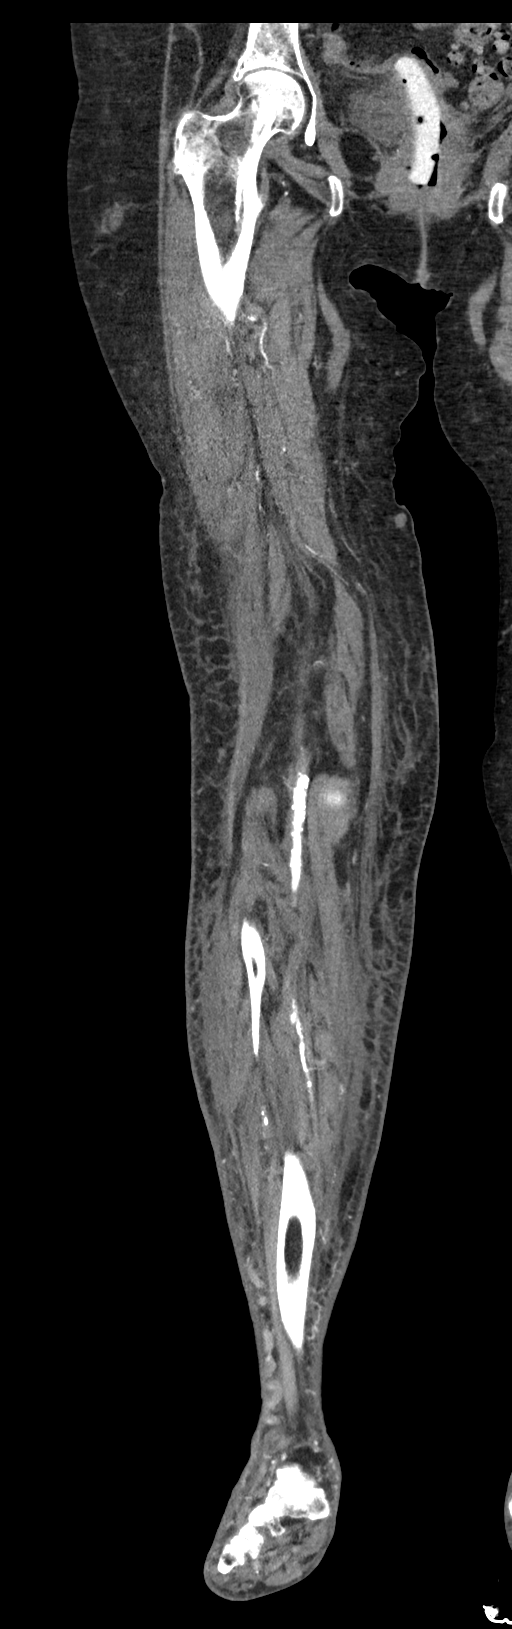
[im 67/90  soft-tissue]
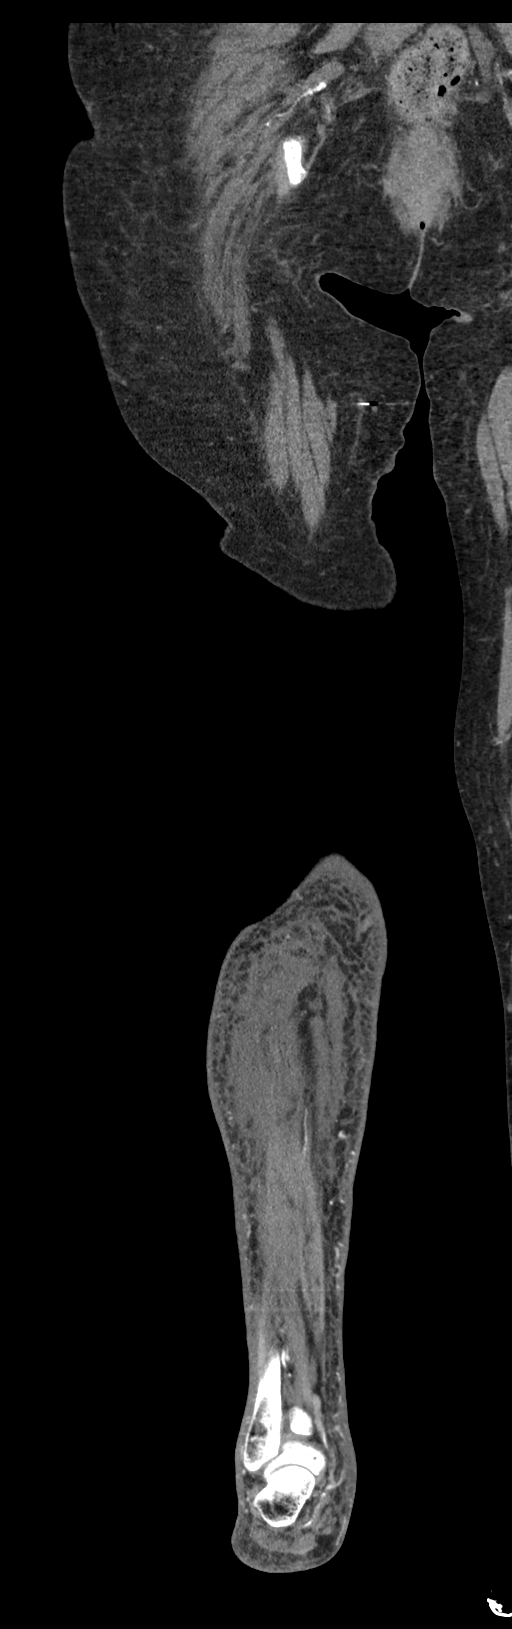

[10 of 46 positions shown; findings below may reference images not displayed]

FINDINGS: CTA FINDINGS

Common femoral artery: There is heterogeneous partly calcified
plaque with no significant stenosis.

Profunda femoral artery: Heterogeneous partly calcified plaque with
no hemodynamically significant stenosis.

Femoral artery: Heterogeneous partly calcified plaque. No
hemodynamically significant stenosis.

Popliteal artery: Heterogeneous partly calcified plaque with no
significant stenosis.

Leg arteries: The anterior tibial artery is occluded shortly after
its origin. It is reconstituted at the ankle from collaterals.
Heterogeneous plaque is noted throughout the course of the posterior
tibial artery. Only faint flow is seen into the distal leg. The
vessel canes additional flow from collaterals at the level of the
tarsal tunnel. The peroneal vein is relatively well preserved
finding collateral flow to the ankle.

Review of the MIP images confirms the above findings.

NON CTA FINDINGS

Skeletal structures: No fracture. No bone lesion. No areas of bone
resorption are seen to suggest osteomyelitis.

Soft tissues: The anterior thigh hematoma, centered on the vastus
lateralis and medius, is significantly smaller. It measures
approximately 14 x 2.7 x 5.4 cm, previously approximately 2.2 cm x
5.5 cm x 0.6 cm. It does not affect the neurovascular bundle.

Subcutaneous soft tissue edema is decreased as well. In the inferior
inguinal lymph node has mildly increased in size, measuring 13 mm in
short axis, previously 1 cm. Superficial varicosities along the
anteromedial thigh are stable.

There is significant subcutaneous edema throughout the leg, most
severe in the mid to proximal calf.

No soft tissue air.

Joints:  No joint effusion or acute finding.
IMPRESSION: 1. There has been improvement since the prior CT with a significant
decrease in size in the intramuscular hematoma line within the
vastus lateralis and medialis muscles.
2. Atherosclerotic disease is noted throughout the arteries of the
right lower extremity. There is no hemodynamically significant
stenosis from the common femoral artery through the popliteal
artery. However, in the leg, the anterior tibial artery is occluded
chest guidance origin. The posterior tibial artery is occluded more
distally. Both of these vessels are reconstituted at the ankle from
collaterals.
3. There is significant soft tissue edema of the leg. There is no
soft tissue air to suggest fasciitis.
4. Mildly enlarged inferior inguinal lymph node has increased in
size from the prior CT.
These results were called by telephone at the time of interpretation
on 09/09/2016 at [DATE] to Dr. MIGY GASHIM , who verbally
acknowledged these results.

## 2020-03-17 IMAGING — CT CT ANGIO CHEST
2 of 6 series · 17 of 46 positions shown · IV contrast (APPLIED)
Comparison: 11/17/2016 CTA chest.  07/21/2017 chest radiograph.

CLINICAL DATA: 88 y/o F; worsening shortness of breath over the
last week. 07/27/2017 change of defibrillator.

EXAM:
CT ANGIOGRAPHY CHEST WITH CONTRAST
TECHNIQUE: Multidetector CT imaging of the chest was performed using the
standard protocol during bolus administration of intravenous
contrast. Multiplanar CT image reconstructions and MIPs were
obtained to evaluate the vascular anatomy.
CONTRAST:  60mL KXDIW1-GYY IOPAMIDOL (KXDIW1-GYY) INJECTION 76%

[Series 5: coronal mpr · coronal · 0.57mm/px · 3 of 93 slices shown]
[im 24/93  soft-tissue]
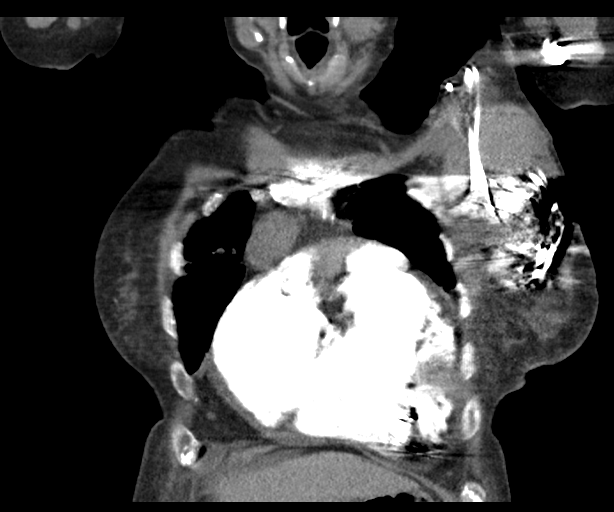
[im 47/93  soft-tissue]
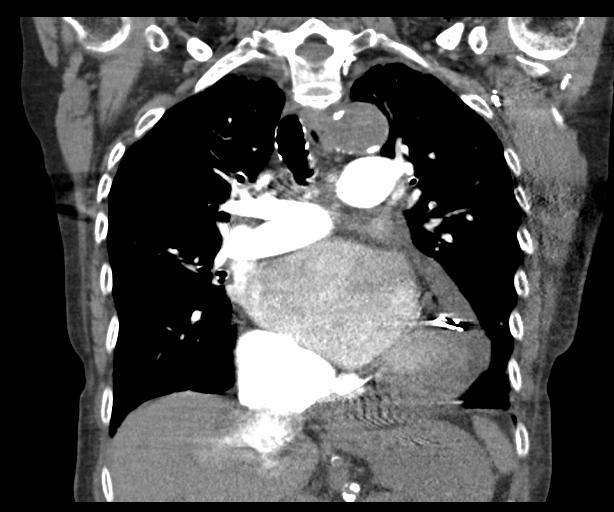
[im 70/93  soft-tissue]
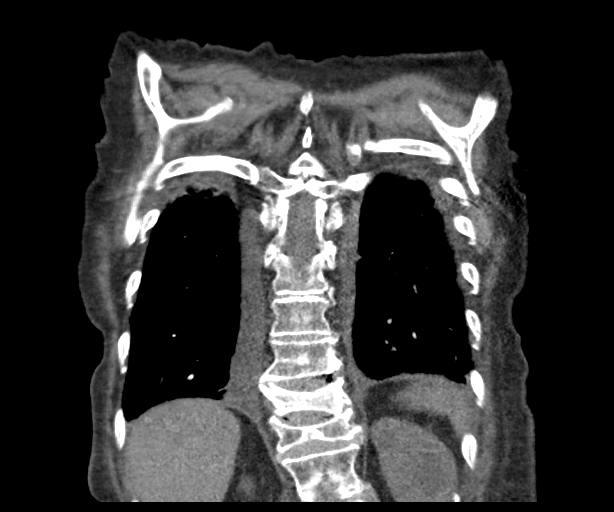

[Series 7: thins · axial · 0.64mm/px · z∈[-647,-396]mm · 14 of 275 slices shown]
[im 12/275  lung]
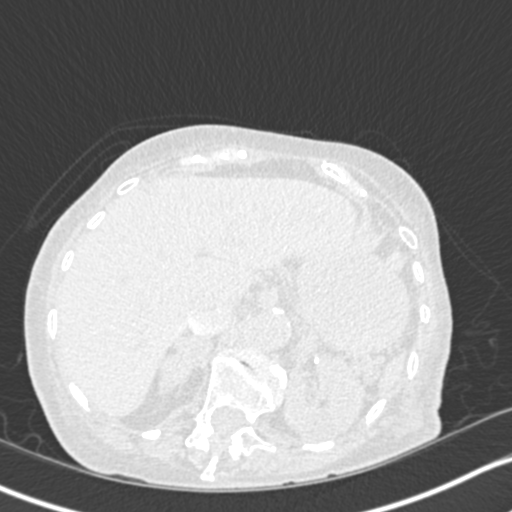
[im 36/275  soft-tissue]
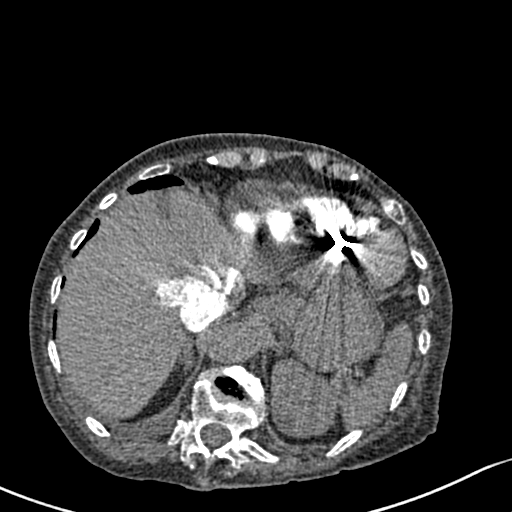
[im 48/275  lung]
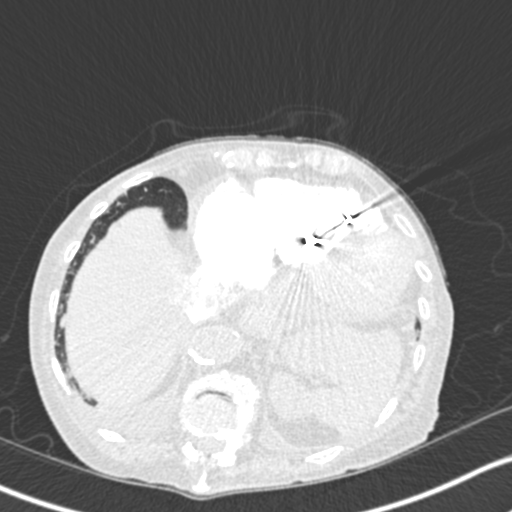
[im 72/275  soft-tissue]
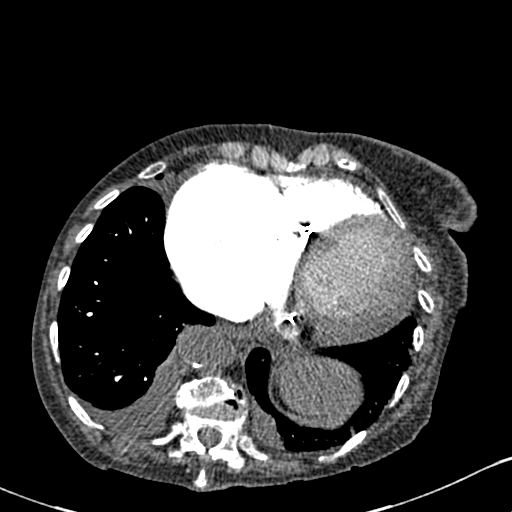
[im 96/275  lung]
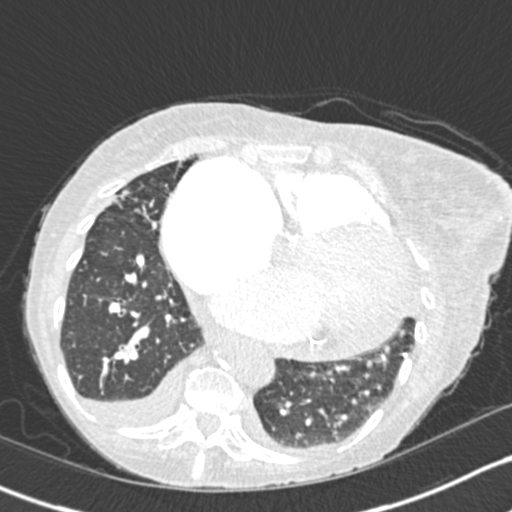
[im 108/275  soft-tissue]
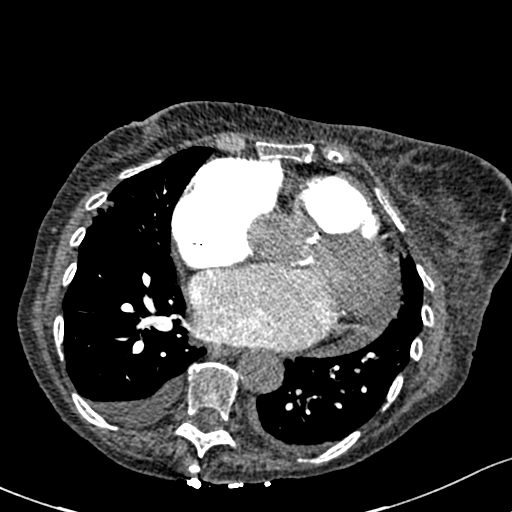
[im 132/275  lung]
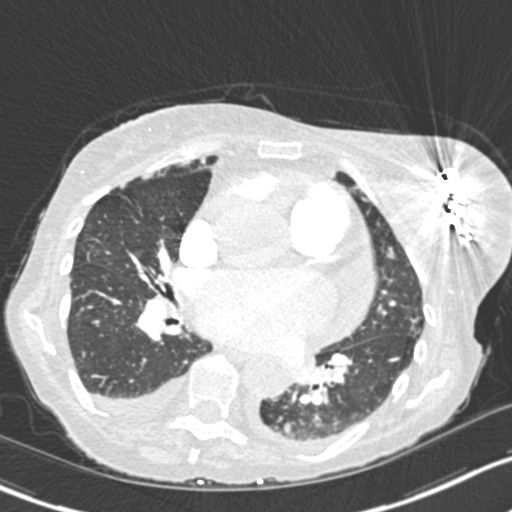
[im 143/275  soft-tissue]
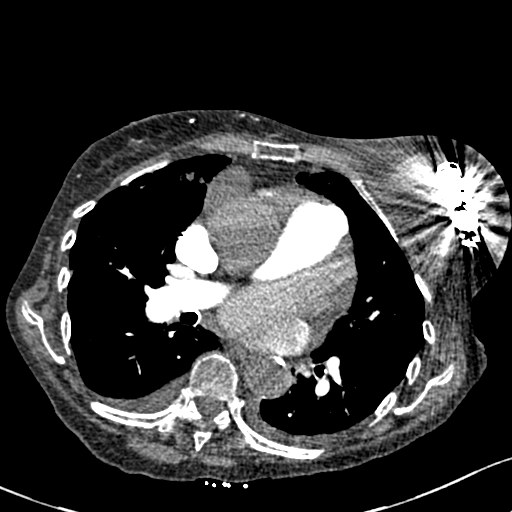
[im 167/275  lung]
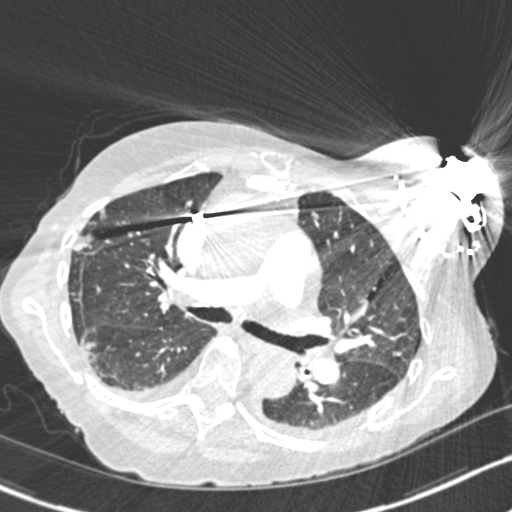
[im 179/275  soft-tissue]
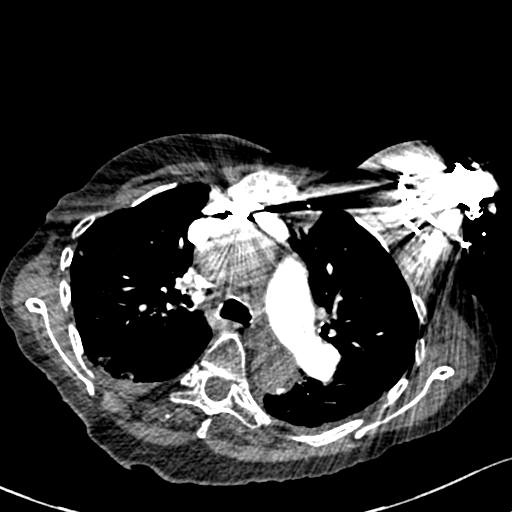
[im 203/275  lung]
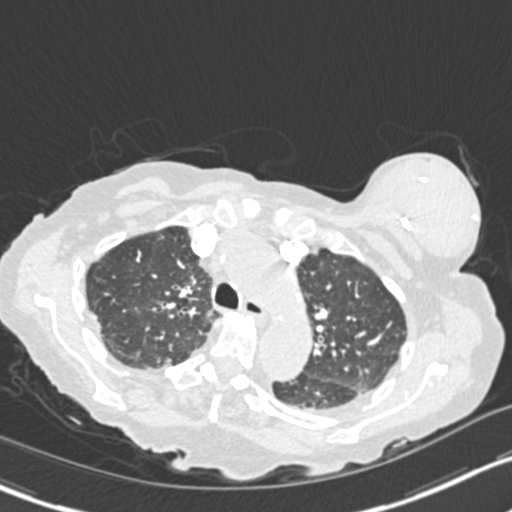
[im 227/275  soft-tissue]
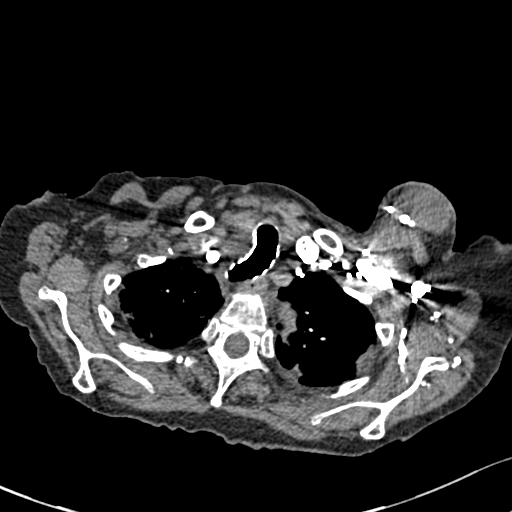
[im 239/275  lung]
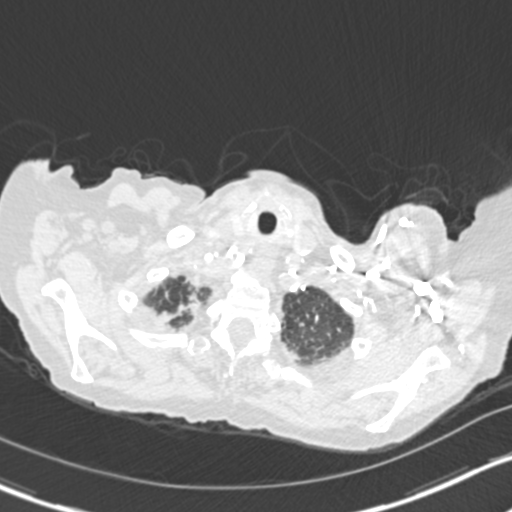
[im 263/275  soft-tissue]
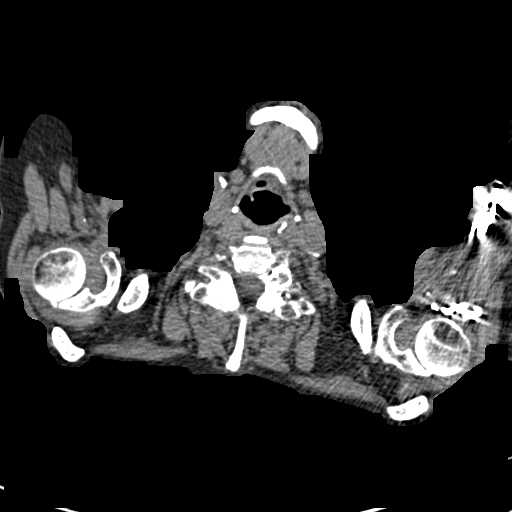

[17 of 46 positions shown; findings below may reference images not displayed]

FINDINGS: Cardiovascular: Ascending aorta measures 4.3 cm. Severe cardiomegaly
with marked biatrial enlargement. Small pericardial effusion. Aortic
valvular and mitral annular calcification. Severe coronary artery
calcification. Moderate calcific atherosclerosis of the aorta. Three
lead AICD.

Satisfactory opacification of the pulmonary arteries.

Mediastinum/Nodes: No enlarged mediastinal, hilar, or axillary lymph
nodes. Thyroid gland, trachea, and esophagus demonstrate no
significant findings.

Lungs/Pleura: Numerous clustered nodules in bronchovascular
distribution at the periphery of the lungs, similar to prior CT of
chest. Small right larger than left pleural effusions. No
consolidation or pneumothorax.

Upper Abdomen: 24 mm left kidney cyst. Atrophic right kidney,
partially visualized.

Musculoskeletal: Fluid with soft tissue attenuation and air
surrounding the pacemaker device in the left anterior chest wall.
Severe T12 and moderate T11 compression deformities. The T11
compression deformity is progressed from [DATE] and stable from
[DATE]. Stable T12 compression deformity.

Review of the MIP images confirms the above findings.
IMPRESSION: 1. No pulmonary embolus identified.
2. Fluid surrounding left pacemaker device and anterior chest wall
with increased attenuation, probably hematoma. Also punctate foci of
air in the pacemaker cavity, correlate for recent instrumentation
versus infection.
3. Findings of chronic bronchiolitis, possibly MOATSHE.
4. Ascending aortic aneurysm measuring 4.3 cm. Recommend annual
imaging followup by CTA or MRA. This recommendation follows 4080
ACCF/AHA/AATS/ACR/ASA/SCA/KAM PIU/UTA/BLONDINACKA/TIGER Guidelines for the
Diagnosis and Management of Patients with Thoracic Aortic Disease.
Circulation. 4080; 121: E266-e369.
5. Small pericardial effusion.
6. Stable severe cardiomegaly and coronary artery calcification.
7. Moderate T11 compression deformity stable from [DATE], progressed
from [DATE]. Stable severe T12 compression deformity.

By: Relindas Auad M.D.
